# Patient Record
Sex: Female | Born: 1950 | Race: Black or African American | Hispanic: No | Smoking: Never smoker
Health system: Southern US, Community
[De-identification: ages and names within clinical notes are randomized; demographics above are authoritative.]

## PROBLEM LIST (undated history)

## (undated) DIAGNOSIS — E785 Hyperlipidemia, unspecified: Secondary | ICD-10-CM

## (undated) DIAGNOSIS — E039 Hypothyroidism, unspecified: Secondary | ICD-10-CM

## (undated) DIAGNOSIS — L639 Alopecia areata, unspecified: Secondary | ICD-10-CM

## (undated) DIAGNOSIS — M1990 Unspecified osteoarthritis, unspecified site: Secondary | ICD-10-CM

## (undated) DIAGNOSIS — E119 Type 2 diabetes mellitus without complications: Secondary | ICD-10-CM

## (undated) DIAGNOSIS — I10 Essential (primary) hypertension: Secondary | ICD-10-CM

## (undated) HISTORY — DX: Hypothyroidism, unspecified: E03.9

## (undated) HISTORY — DX: Alopecia areata, unspecified: L63.9

## (undated) HISTORY — DX: Hyperlipidemia, unspecified: E78.5

## (undated) HISTORY — DX: Essential (primary) hypertension: I10

## (undated) HISTORY — DX: Type 2 diabetes mellitus without complications: E11.9

## (undated) HISTORY — PX: CARPAL TUNNEL RELEASE: SHX101

---

## 1988-12-12 HISTORY — PX: ABDOMINAL HYSTERECTOMY: SHX81

## 1999-06-24 ENCOUNTER — Encounter: Admission: RE | Admit: 1999-06-24 | Discharge: 1999-07-08 | Payer: Self-pay

## 2001-04-02 ENCOUNTER — Ambulatory Visit (HOSPITAL_COMMUNITY): Admission: RE | Admit: 2001-04-02 | Discharge: 2001-04-02 | Payer: Self-pay | Admitting: Cardiovascular Disease

## 2005-03-22 ENCOUNTER — Encounter: Admission: RE | Admit: 2005-03-22 | Discharge: 2005-06-20 | Payer: Self-pay | Admitting: Internal Medicine

## 2005-07-29 ENCOUNTER — Emergency Department (HOSPITAL_COMMUNITY): Admission: EM | Admit: 2005-07-29 | Discharge: 2005-07-29 | Payer: Self-pay | Admitting: Emergency Medicine

## 2006-12-12 HISTORY — PX: COLONOSCOPY: SHX174

## 2007-03-27 ENCOUNTER — Ambulatory Visit (HOSPITAL_BASED_OUTPATIENT_CLINIC_OR_DEPARTMENT_OTHER): Admission: RE | Admit: 2007-03-27 | Discharge: 2007-03-27 | Payer: Self-pay | Admitting: Orthopaedic Surgery

## 2007-03-31 ENCOUNTER — Emergency Department (HOSPITAL_COMMUNITY): Admission: EM | Admit: 2007-03-31 | Discharge: 2007-03-31 | Payer: Self-pay | Admitting: Emergency Medicine

## 2007-04-01 ENCOUNTER — Emergency Department (HOSPITAL_COMMUNITY): Admission: EM | Admit: 2007-04-01 | Discharge: 2007-04-01 | Payer: Self-pay | Admitting: Emergency Medicine

## 2007-04-01 ENCOUNTER — Ambulatory Visit: Payer: Self-pay | Admitting: Internal Medicine

## 2007-04-03 ENCOUNTER — Ambulatory Visit (HOSPITAL_COMMUNITY): Admission: RE | Admit: 2007-04-03 | Discharge: 2007-04-03 | Payer: Self-pay | Admitting: Gastroenterology

## 2007-04-10 ENCOUNTER — Inpatient Hospital Stay (HOSPITAL_COMMUNITY): Admission: EM | Admit: 2007-04-10 | Discharge: 2007-04-13 | Payer: Self-pay | Admitting: Emergency Medicine

## 2007-08-17 ENCOUNTER — Ambulatory Visit: Payer: Self-pay

## 2007-08-29 ENCOUNTER — Ambulatory Visit: Payer: Self-pay | Admitting: Internal Medicine

## 2007-08-31 ENCOUNTER — Encounter: Admission: RE | Admit: 2007-08-31 | Discharge: 2007-08-31 | Payer: Self-pay | Admitting: Family Medicine

## 2008-09-03 ENCOUNTER — Encounter: Admission: RE | Admit: 2008-09-03 | Discharge: 2008-09-03 | Payer: Self-pay | Admitting: Internal Medicine

## 2008-12-12 LAB — CONVERTED CEMR LAB

## 2009-09-04 ENCOUNTER — Encounter: Admission: RE | Admit: 2009-09-04 | Discharge: 2009-09-04 | Payer: Self-pay | Admitting: Family Medicine

## 2010-08-24 ENCOUNTER — Observation Stay (HOSPITAL_COMMUNITY): Admission: EM | Admit: 2010-08-24 | Discharge: 2010-08-25 | Payer: Self-pay | Admitting: Emergency Medicine

## 2010-08-24 ENCOUNTER — Ambulatory Visit: Payer: Self-pay | Admitting: Internal Medicine

## 2010-08-25 ENCOUNTER — Encounter: Payer: Self-pay | Admitting: Internal Medicine

## 2010-08-25 ENCOUNTER — Encounter (INDEPENDENT_AMBULATORY_CARE_PROVIDER_SITE_OTHER): Payer: Self-pay | Admitting: Emergency Medicine

## 2010-08-25 LAB — CONVERTED CEMR LAB
BUN: 8 mg/dL
CO2: 106 meq/L
Chloride: 106 meq/L
Creatinine, Ser: 0.86 mg/dL
Glucose, Bld: 89 mg/dL
HCT: 37.6 %
Hemoglobin: 12.8 g/dL
Platelets: 215 10*3/uL
Potassium: 4.4 meq/L
Sodium: 140 meq/L
WBC: 5.8 10*3/uL

## 2010-09-09 ENCOUNTER — Encounter: Admission: RE | Admit: 2010-09-09 | Discharge: 2010-09-09 | Payer: Self-pay | Admitting: Family Medicine

## 2010-09-09 LAB — HM MAMMOGRAPHY

## 2010-11-30 ENCOUNTER — Encounter: Payer: Self-pay | Admitting: Internal Medicine

## 2010-12-29 ENCOUNTER — Encounter: Payer: Self-pay | Admitting: Internal Medicine

## 2010-12-29 DIAGNOSIS — E1169 Type 2 diabetes mellitus with other specified complication: Secondary | ICD-10-CM | POA: Insufficient documentation

## 2010-12-29 DIAGNOSIS — I10 Essential (primary) hypertension: Secondary | ICD-10-CM | POA: Insufficient documentation

## 2010-12-29 DIAGNOSIS — E039 Hypothyroidism, unspecified: Secondary | ICD-10-CM | POA: Insufficient documentation

## 2010-12-29 DIAGNOSIS — I2510 Atherosclerotic heart disease of native coronary artery without angina pectoris: Secondary | ICD-10-CM | POA: Insufficient documentation

## 2010-12-29 DIAGNOSIS — K219 Gastro-esophageal reflux disease without esophagitis: Secondary | ICD-10-CM | POA: Insufficient documentation

## 2010-12-29 DIAGNOSIS — E785 Hyperlipidemia, unspecified: Secondary | ICD-10-CM | POA: Insufficient documentation

## 2010-12-29 DIAGNOSIS — M1990 Unspecified osteoarthritis, unspecified site: Secondary | ICD-10-CM | POA: Insufficient documentation

## 2010-12-29 DIAGNOSIS — E119 Type 2 diabetes mellitus without complications: Secondary | ICD-10-CM | POA: Insufficient documentation

## 2010-12-29 DIAGNOSIS — I152 Hypertension secondary to endocrine disorders: Secondary | ICD-10-CM | POA: Insufficient documentation

## 2010-12-31 ENCOUNTER — Encounter: Payer: Self-pay | Admitting: Internal Medicine

## 2010-12-31 ENCOUNTER — Ambulatory Visit
Admission: RE | Admit: 2010-12-31 | Discharge: 2010-12-31 | Payer: Self-pay | Source: Home / Self Care | Attending: Internal Medicine | Admitting: Internal Medicine

## 2010-12-31 DIAGNOSIS — G7000 Myasthenia gravis without (acute) exacerbation: Secondary | ICD-10-CM | POA: Insufficient documentation

## 2010-12-31 DIAGNOSIS — L659 Nonscarring hair loss, unspecified: Secondary | ICD-10-CM | POA: Insufficient documentation

## 2011-01-13 NOTE — Assessment & Plan Note (Signed)
Summary: New / Medicare / Regular Medicaid / #? cd   Vital Signs:  Patient profile:   60 year old female Height:      66 inches (167.64 cm) Weight:      172.4 pounds (78.36 kg) BMI:     27.93 O2 Sat:      99 % on Room air Temp:     97.9 degrees F (36.61 degrees C) oral Pulse rate:   55 / minute BP sitting:   120 / 78  (left arm) Cuff size:   regular  Vitals Entered By: Orlan Leavens RMA (December 31, 2010 9:44 AM)  O2 Flow:  Room air CC: New patient Is Patient Diabetic? Yes Did you bring your meter with you today? No Pain Assessment Patient in pain? no        Primary Care Provider:  Newt Lukes MD  CC:  New patient.  History of Present Illness: new pt to me and our division, here to est care prev folowed with dr. [facility] Lanius [facility]  concerned with hair loss -  onset 3-4 months ago - affects B temporal and crown/frontal scalp sees derm for same - ?alopecia or med induced -  wants off "all my medications"  also reviewed chronic med issues -  myasthenia gravis - dx age 44y - follows with neuro at [facility] annually for same - no recent flares or exac -reports compliance with ongoing medical treatment and no changes in medication dose or frequency. denies adverse side effects related to current therapy.   DM2 -  dx 02/2005- reports compliance with ongoing medical treatment and no changes in medication dose or frequency. denies adverse side effects related to current therapy.   HTN - reports compliance with ongoing medical treatment and no changes in medication dose or frequency. denies adverse side effects related to current therapy.   dyslipidemia - reports compliance with ongoing medical treatment and no changes in medication dose or frequency. denies adverse side effects related to current therapy.   hypothyroid - reports compliance with ongoing medical treatment and no changes in medication dose or frequency. denies adverse side effects related to current therapy.    Preventive Screening-Counseling & Management  Alcohol-Tobacco     Alcohol drinks/day: 0     Alcohol Counseling: not indicated; patient does not drink     Smoking Status: never     Tobacco Counseling: not indicated; no tobacco use  Caffeine-Diet-Exercise     Does Patient Exercise: yes     Exercise Counseling: not indicated; exercise is adequate     Depression Counseling: not indicated; screening negative for depression  Safety-Violence-Falls     Seat Belt Counseling: not indicated; patient wears seat belts     Helmet Counseling: not indicated; patient wears helmet when riding bicycle/motocycle     Firearm Counseling: not applicable     Violence Counseling: not indicated; no violence risk noted  Clinical Review Panels:  Prevention   Last Mammogram:  No specific mammographic evidence of malignancy.  Assessment: BIRADS 1. Location: [facility].    (09/09/2010)   Last Pap Smear:  Interpretation Result:Negative for intraepithelial Lesion or Malignancy.    (12/12/2008)  Immunizations   Last Tetanus Booster:  Historical (12/12/2009)  CBC   WBC:  5.8 (08/25/2010)   Hgb:  12.8 (08/25/2010)   Hct:  37.6 (08/25/2010)   Platelets:  215 (08/25/2010)  Complete Metabolic Panel   Glucose:  89 (08/25/2010)   Sodium:  140 (08/25/2010)  Potassium:  4.4 (08/25/2010)   Chloride:  106 (08/25/2010)   CO2:  106 (08/25/2010)   BUN:  8 (08/25/2010)   Creatinine:  0.86 (08/25/2010)   Current Medications (verified): 1)  Lipitor 10 Mg Tabs (Atorvastatin Calcium) .... Take 1 By Mouth Once Daily 2)  Glipizide 5 Mg Tabs (Glipizide) .... Take 1 By Mouth Once Daily 3)  Metformin Hcl 500 Mg Tabs (Metformin Hcl) .... Take 1 Two Times A Day 4)  Synthroid 75 Mcg Tabs (Levothyroxine Sodium) .... Take 1 By Mouth Once Daily 5)  Omeprazole 20 Mg Cpdr (Omeprazole) .... Take 1 By Mouth Once Daily 6)  Losartan Potassium 100 Mg Tabs (Losartan Potassium) .... Take 1 By Mouth Once  Daily 7)  Mestinon 60 Mg Tabs (Pyridostigmine Bromide) .... Take Three Times A Day 8)  Amlodipine Besylate 5 Mg Tabs (Amlodipine Besylate) .... Take 1 By Mouth Once Daily 9)  Iron 325 (65 Fe) Mg Tabs (Ferrous Sulfate) .... Take 1 By Mouth Once Daily  Allergies (verified): No Known Drug Allergies  Past History:  Past Medical History: Diabetes mellitus, type II GERD Hyperlipidemia Hypertension Hypothyroidism Osteoarthritis Coronary artery disease myashenia gravis - dx age 2y  MD roster: neuro - caress @ [facility] - [facility] - [facility]- [facility]  Past Surgical History: Hysterectomy (1990)  Family History: unknown  Social History: Never Smoked no alcohol divorced, lives alone - 3 kids Smoking Status:  never Does Patient Exercise:  yes  Review of Systems       see HPI above. I have reviewed all other systems and they were negative.   Physical Exam  General:  alert, well-developed, well-nourished, and cooperative to examination.    Head:  Normocephalic and atraumatic without obvious abnormalities. adrogenic pattern alopecia  Eyes:  vision grossly intact; pupils equal, round and reactive to light.  conjunctiva and lids normal.    Ears:  R ear normal and L ear normal.   Mouth:  teeth and gums in good repair; mucous membranes moist, without lesions or ulcers. oropharynx clear without exudate, no erythema.  Neck:  supple, full ROM, no masses, no thyromegaly; no thyroid nodules or tenderness. no JVD or carotid bruits.   Lungs:  normal respiratory effort, no intercostal retractions or use of accessory muscles; normal breath sounds bilaterally - no crackles and no wheezes.    Heart:  normal rate, regular rhythm, no murmur, and no rub. BLE without edema. Abdomen:  soft, non-tender, normal bowel sounds, no distention; no masses and no appreciable hepatomegaly or splenomegaly.   Msk:  No deformity or scoliosis noted of thoracic or lumbar spine.   Neurologic:   alert & oriented X3 and cranial nerves II-XII symetrically intact.  strength normal in all extremities, sensation intact to light touch, and gait normal. speech fluent without dysarthria or aphasia; follows commands with good comprehension.  Skin:  no rashes, vesicles, ulcers, or erythema. No nodules or irregularity to palpation.  Psych:  Oriented X3, memory intact for recent and remote, normally interactive, good eye contact, not anxious appearing, not depressed appearing, and not agitated.      Impression & Recommendations:  Problem # 1:  ALOPECIA (ICD-704.00) pt believes med related - has seen derm for same - unable to tol rogain as per derm rec due to scalp burning/itch - ?immunologic given hx MG?? - send for copy of derm eval  will try to reduce other meds as tol so long as other chronic med issues are controlled - see next  Problem # 2:  DIABETES MELLITUS, TYPE II (ICD-250.00)  send for records on last a1c - pt reports 6.0 stop OHA and cont metformin until labs revioewed -  to call if uncontrolled cbg btwn now and next ov  The following medications were removed from the medication list:    Glipizide 5 Mg Tabs (Glipizide) .Marland Kitchen... Take 1 by mouth once daily    Aspirin 81 Mg Tbec (Aspirin) .Marland Kitchen... Take 1 by mouth once daily Her updated medication list for this problem includes:    Metformin Hcl 500 Mg Tabs (Metformin hcl) .Marland Kitchen... Take 1 two times a day    Losartan Potassium 100 Mg Tabs (Losartan potassium) .Marland Kitchen... Take 1 by mouth once daily  Labs Reviewed: Creat: 0.86 (08/25/2010)     Problem # 3:  HYPERTENSION (ICD-401.9)  reduce meds and watch to ensure cont BP control note pt has lost >30# since being rx'd all these meds and may be controlled with fewer meds -  The following medications were removed from the medication list:    Amlodipine Besylate 5 Mg Tabs (Amlodipine besylate) .Marland Kitchen... Take 1 by mouth once daily Her updated medication list for this problem includes:    Losartan  Potassium 100 Mg Tabs (Losartan potassium) .Marland Kitchen... Take 1 by mouth once daily  BP today: 120/78  Labs Reviewed: K+: 4.4 (08/25/2010) Creat: : 0.86 (08/25/2010)     Problem # 4:  HYPOTHYROIDISM (ICD-244.9) review last labs - doubt will be able to do without any thyorid med - same explained to pt Her updated medication list for this problem includes:    Synthroid 75 Mcg Tabs (Levothyroxine sodium) .Marland Kitchen... Take 1 by mouth once daily  Problem # 5:  HYPERLIPIDEMIA (ICD-272.4) send for and review last labs - ?reduce dose or consider stopping med if other dx well controlled Her updated medication list for this problem includes:    Lipitor 10 Mg Tabs (Atorvastatin calcium) .Marland Kitchen... 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 Take 1 by mouth once daily 2)  Metformin Hcl 500 Mg Tabs (Metformin hcl) .... Take 1 two times a day 3)  Synthroid 75 Mcg Tabs (Levothyroxine sodium) .... Take 1 by mouth once daily 4)  Omeprazole 20 Mg Cpdr (Omeprazole) .... Take 1 by mouth once daily 5)  Losartan Potassium 100 Mg Tabs (Losartan potassium) .... Take 1 by mouth once daily 6)  Mestinon 60 Mg Tabs (Pyridostigmine bromide) .... Take three times a day - or as per neurology doctors 7)  Iron 325 (65 Fe) Mg Tabs (Ferrous sulfate) .... Take 1 by mouth once daily  Patient Instructions: 1)  it was good to see you today. 2)  medical history and medications reviewed today 3)  will send for records from dr. Providence Lanius and dr. [facility] - please ask your other specialists to send me copies of their visits/notes when you see them 4)  stop glipizide and amlodipine today - other medications continue without change 5)  check your blood pressure and call  if over 140/90; also check your sugars - call sugar if over 200 6)  Please schedule a follow-up appointment in 6 weeks to recheck blood pressure and medication review, sooner if problems.    Orders Added:  1)  New Patient Level III [99203]   Immunization History:  Tetanus/Td Immunization History:    Tetanus/Td:  historical (12/12/2009)   Immunization History:  Tetanus/Td Immunization History:    Tetanus/Td:  Historical (12/12/2009)   Mammogram  Procedure date:  09/09/2010  Findings:      Location: [facility].   No specific mammographic evidence of malignancy.  Assessment: BIRADS 1.   Pap Smear  Procedure date:  12/12/2008  Findings:      Interpretation Result:Negative for intraepithelial Lesion or Malignancy.

## 2011-01-13 NOTE — Medication Information (Signed)
Summary: List of medications/Patient  List of medications/Patient   Imported By: Sherian Rein 01/05/2011 11:38:42  _____________________________________________________________________  External Attachment:    Type:   Image     Comment:   External Document

## 2011-01-13 NOTE — Letter (Signed)
Summary: Duard Larsen MD  Duard Larsen MD   Imported By: Sherian Rein 01/06/2011 12:47:03  _____________________________________________________________________  External Attachment:    Type:   Image     Comment:   External Document

## 2011-02-11 ENCOUNTER — Ambulatory Visit (INDEPENDENT_AMBULATORY_CARE_PROVIDER_SITE_OTHER): Payer: Medicare Other | Admitting: Internal Medicine

## 2011-02-11 ENCOUNTER — Encounter: Payer: Self-pay | Admitting: Internal Medicine

## 2011-02-11 ENCOUNTER — Other Ambulatory Visit: Payer: Medicare Other

## 2011-02-11 ENCOUNTER — Other Ambulatory Visit: Payer: Self-pay | Admitting: Internal Medicine

## 2011-02-11 DIAGNOSIS — E039 Hypothyroidism, unspecified: Secondary | ICD-10-CM

## 2011-02-11 DIAGNOSIS — E119 Type 2 diabetes mellitus without complications: Secondary | ICD-10-CM

## 2011-02-11 DIAGNOSIS — I10 Essential (primary) hypertension: Secondary | ICD-10-CM

## 2011-02-11 DIAGNOSIS — L659 Nonscarring hair loss, unspecified: Secondary | ICD-10-CM

## 2011-02-11 LAB — HEMOGLOBIN A1C: Hgb A1c MFr Bld: 5.8 % (ref 4.6–6.5)

## 2011-02-11 LAB — TSH: TSH: 0.97 u[IU]/mL (ref 0.35–5.50)

## 2011-02-11 LAB — T4, FREE: Free T4: 0.9 ng/dL (ref 0.60–1.60)

## 2011-02-17 NOTE — Assessment & Plan Note (Signed)
Summary: 6 WEEK FOLLOWUP//STC   Vital Signs:  Patient profile:   60 year old female Weight:      170 pounds (77.27 kg) O2 Sat:      99 % on Room air Temp:     98.2 degrees F (36.78 degrees C) oral Pulse rate:   52 / minute BP sitting:   120 / 84  (left arm) Cuff size:   large  Vitals Entered By: Orlan Leavens RMA (February 11, 2011 10:34 AM)  O2 Flow:  Room air CC: 6 week follow-up Is Patient Diabetic? Yes Did you bring your meter with you today? No Pain Assessment Patient in pain? no        Primary Care Provider:  Newt Lukes MD  CC:  6 week follow-up.  History of Present Illness: here for f/u  alsopecia - onset hair loss 10/2010 affects B temporal and crown/frontal scalp s/p derm eval for same ([facility]) - ?alopecia vs med induced -  wants off "all my medications", esp levothyrox wants second opinion  myasthenia gravis - dx age 29y - follows with neuro at [facility] annually for same - no recent flares or exac -reports compliance with ongoing medical treatment and no changes in medication dose or frequency. denies adverse side effects related to current therapy.   DM2 -  dx 02/2005- reports compliance with ongoing medical treatment and no changes in medication dose or frequency. denies adverse side effects related to current therapy.  improved cbg with wt loss  HTN - reports compliance with ongoing medical treatment and no changes in medication dose or frequency. denies adverse side effects related to current therapy.   dyslipidemia - reports compliance with ongoing medical treatment and no changes in medication dose or frequency. denies adverse side effects related to current therapy.   hypothyroid - reports compliance with ongoing medical treatment and no changes in medication dose or frequency. denies adverse side effects related to current therapy.   Clinical Review Panels:  Prevention   Last Mammogram:  No specific mammographic evidence of malignancy.  Assessment: BIRADS 1. Location: [facility].    (09/09/2010)   Last Pap Smear:  Interpretation Result:Negative for intraepithelial Lesion or Malignancy.    (12/12/2008)  Immunizations   Last Tetanus Booster:  Historical (12/12/2009)  Diabetes Management   Creatinine:  0.86 (08/25/2010)  CBC   WBC:  5.8 (08/25/2010)   Hgb:  12.8 (08/25/2010)   Hct:  37.6 (08/25/2010)   Platelets:  215 (08/25/2010)  Complete Metabolic Panel   Glucose:  89 (08/25/2010)   Sodium:  140 (08/25/2010)   Potassium:  4.4 (08/25/2010)   Chloride:  106 (08/25/2010)   CO2:  106 (08/25/2010)   BUN:  8 (08/25/2010)   Creatinine:  0.86 (08/25/2010)   Current Medications (verified): 1)  Lipitor 10 Mg Tabs (Atorvastatin Calcium) .... Take 1 By Mouth Once Daily 2)  Metformin Hcl 500 Mg Tabs (Metformin Hcl) .... Take 1 Two Times A Day 3)  Synthroid 75 Mcg Tabs (Levothyroxine Sodium) .... Take 1 By Mouth Once Daily 4)  Losartan Potassium 100 Mg Tabs (Losartan Potassium) .... Take 1 By Mouth Once Daily 5)  Mestinon 60 Mg Tabs (Pyridostigmine Bromide) .... Take Three Times A Day - or As Per Neurology Doctors 6)  Iron 325 (65 Fe) Mg Tabs (Ferrous Sulfate) .... Take 1 By Mouth Once Daily  Allergies (verified): No Known Drug Allergies  Past History:  Past Medical History: Diabetes mellitus, type II GERD  Hyperlipidemia  Hypertension Hypothyroidism Osteoarthritis Coronary artery disease myashenia gravis - dx age 64y  MD roster: neuro - caress @ [facility] - [facility] - [facility]- [facility]  Review of Systems  The patient denies fever, chest pain, syncope, headaches, and abdominal pain.    Physical Exam  General:  alert, well-developed, well-nourished, and cooperative to examination.    Lungs:  normal respiratory effort, no intercostal retractions or use of accessory muscles; normal breath sounds bilaterally - no crackles and no wheezes.    Heart:  normal rate,  regular rhythm, no murmur, and no rub. BLE without edema. Skin:  thin hair but no balding across crown and temples B - nontener to palp -no rashes, vesicles, ulcers, or erythema. No nodules or irregularity to palpation.  Psych:  Oriented X3, memory intact for recent and remote, normally interactive, good eye contact, not anxious appearing, not depressed appearing, and not agitated.      Impression & Recommendations:  Problem # 1:  ALOPECIA (ICD-704.00)  Orders: Dermatology Referral (Derma)  pt believes med related - has seen derm ([facility]) for same 11/2010 - consult note in EMR reviewed - not improved with rogain as per derm rec due to scalp burning/itch - ?immunologic given hx MG?? - send for second opinon derm eval  will try to reduce other meds as tol so long as other chronic med issues are controlled - see next  Problem # 2:  HYPOTHYROIDISM (ICD-244.9)  Her updated medication list for this problem includes:    Levothyroxine Sodium 25 Mcg Tabs (Levothyroxine sodium) .Marland Kitchen... 1 by mouth once daily  Orders: TLB-TSH (Thyroid Stimulating Hormone) (84443-TSH) TLB-T4 (Thyrox), Free [phone])  check labs -  doubt will be able to do without thyroid med but will try reduction in dose if normal range - same explained to pt  Problem # 3:  DIABETES MELLITUS, TYPE II (ICD-250.00)  Her updated medication list for this problem includes:    Metformin Hcl 500 Mg Tabs (Metformin hcl) .Marland Kitchen... Take 1 by mouth once daily    Losartan Potassium 100 Mg Tabs (Losartan potassium) .Marland Kitchen... Take 1/2 by mouth once daily  Orders: TLB-A1C / Hgb A1C (Glycohemoglobin) (83036-A1C)   last a1c - pt reports 6.0 off OHA 01/2011 and cont metformin until labs reviewed -  to call if uncontrolled cbg btwn now and next ov  Problem # 4:  HYPERTENSION (ICD-401.9)  Her updated medication list for this problem includes:    Losartan Potassium 100 Mg Tabs (Losartan potassium) .Marland Kitchen... Take 1/2 by mouth once daily  reduced meds  01/2011 and more today; watch to ensure cont BP control note pt has lost >30# since being rx'd all these meds and may be controlled with fewer meds -  BP today: 120/84 Prior BP: 120/78 (12/31/2010)  Labs Reviewed: K+: 4.4 (08/25/2010) Creat: : 0.86 (08/25/2010)     Complete Medication List: 1)  Lipitor 10 Mg Tabs (Atorvastatin calcium) .... Take 1 by mouth once daily 2)  Metformin Hcl 500 Mg Tabs (Metformin hcl) .... Take 1 by mouth once daily 3)  Levothyroxine Sodium 25 Mcg Tabs (Levothyroxine sodium) .Marland Kitchen.. 1 by mouth once daily 4)  Losartan Potassium 100 Mg Tabs (Losartan potassium) .... Take 1/2 by mouth once daily 5)  Mestinon 60 Mg Tabs (Pyridostigmine bromide) .... Take three times a day - or as per neurology doctors 6)  Iron 325 (65 Fe) Mg Tabs (Ferrous sulfate) .... Take 1 by mouth once daily  Patient Instructions: 1)  it was  good to see you today. 2)  test(s) ordered today - your results will be called to you after review; if any changes need to be made or there are abnormal results, you will be notified at that time 3)  we'll make referral to dr. Jorja Loa for second opinion. Our office will contact you regarding this appointment once made.  4)  reduce losartan by half - take 50mg  daily starting today - other medications continue without change for now 5)  check your blood pressure and call if over 140/90; also check your sugars - call sugar if over 200 6)  Please schedule a follow-up appointment in 3 months to recheck blood pressure and medication review, sooner if problems.    Orders Added: 1)  Dermatology Referral [Derma] 2)  Est. Patient Level IV [16109] 3)  TLB-TSH (Thyroid Stimulating Hormone) [84443-TSH] 4)  TLB-T4 (Thyrox), Free [60454-UJ8J] 5)  TLB-A1C / Hgb A1C (Glycohemoglobin) [83036-A1C]

## 2011-02-24 LAB — BASIC METABOLIC PANEL
BUN: 8 mg/dL (ref 6–23)
CO2: 28 mEq/L (ref 19–32)
Calcium: 9.6 mg/dL (ref 8.4–10.5)
Chloride: 106 mEq/L (ref 96–112)
Creatinine, Ser: 0.86 mg/dL (ref 0.4–1.2)
GFR calc Af Amer: >60 mL/min (ref >60)
GFR calc non Af Amer: >60 mL/min (ref >60)
Glucose, Bld: 89 mg/dL (ref 70–99)
Potassium: 4.4 mEq/L (ref 3.5–5.1)
Sodium: 140 mEq/L (ref 135–145)

## 2011-02-24 LAB — POCT CARDIAC MARKERS
CKMB, poc: 1.2 ng/mL (ref 1.0–8.0)
CKMB, poc: <1 ng/mL — ABNORMAL LOW (ref 1.0–8.0)
CKMB, poc: <1 ng/mL — ABNORMAL LOW (ref 1.0–8.0)
Myoglobin, poc: 46.4 ng/mL (ref 12–200)
Myoglobin, poc: 48.5 ng/mL (ref 12–200)
Myoglobin, poc: 60.8 ng/mL (ref 12–200)
Troponin i, poc: <0.05 ng/mL (ref 0.00–0.09)
Troponin i, poc: <0.05 ng/mL (ref 0.00–0.09)
Troponin i, poc: <0.05 ng/mL (ref 0.00–0.09)

## 2011-02-24 LAB — DIFFERENTIAL
Basophils Absolute: 0 10*3/uL (ref 0.0–0.1)
Basophils Relative: 1 % (ref 0–1)
Eosinophils Absolute: 0.5 10*3/uL (ref 0.0–0.7)
Eosinophils Relative: 8 % — ABNORMAL HIGH (ref 0–5)
Lymphocytes Relative: 40 % (ref 12–46)
Lymphs Abs: 2.3 10*3/uL (ref 0.7–4.0)
Monocytes Absolute: 0.5 10*3/uL (ref 0.1–1.0)
Monocytes Relative: 9 % (ref 3–12)
Neutro Abs: 2.5 10*3/uL (ref 1.7–7.7)
Neutrophils Relative %: 43 % (ref 43–77)

## 2011-02-24 LAB — CBC
HCT: 37.6 % (ref 36.0–46.0)
Hemoglobin: 12.8 g/dL (ref 12.0–15.0)
MCH: 30.5 pg (ref 26.0–34.0)
MCHC: 34 g/dL (ref 30.0–36.0)
MCV: 89.7 fL (ref 78.0–100.0)
Platelets: 215 10*3/uL (ref 150–400)
RBC: 4.19 MIL/uL (ref 3.87–5.11)
RDW: 13.4 % (ref 11.5–15.5)
WBC: 5.8 10*3/uL (ref 4.0–10.5)

## 2011-02-24 LAB — D-DIMER, QUANTITATIVE (NOT AT ARMC): D-Dimer, Quant: 0.42 ug/mL-FEU (ref 0.00–0.48)

## 2011-04-26 NOTE — Assessment & Plan Note (Signed)
[facility]                            CARDIOLOGY OFFICE NOTE   NAME:Picone, KINZE LABO                      MRN:          [id number]  DATE:08/29/2007                            DOB:          28-Dec-1950    CARDIOLOGIST:  Bevelyn Buckles. Bensimhon, M.D.   PRIMARY CARE PHYSICIAN:  Maryelizabeth Rowan, M.D.   REASON FOR APPOINTMENT:  Follow up, post stress test.   HISTORY OF PRESENT ILLNESS:  Ms. Tobia is a 60 year old female patient  with a history of diabetes mellitus, hypertension, hyperlipidemia, and  myasthenia gravis.  He was seen by Dr. Gala Romney in the ER on April 01, 2007 secondary to chest pain.  This was very atypical for ischemia, and  he discharged her from the emergency room with plans for outpatient  stress test.  Unfortunately, the patient developed right knee infection,  status post arthroscopic surgery.  She was in the hospital for a few  days and actually had a PICC line placed and eventually was discharged  to home with home antibiotics for quite some time.  She was unable to do  the stress test until recently.  This was done on September 5th.  It was  an Adenosine study that showed no sign of scar ischemia with an EF of  50%.  LV function was normal in contractility and thickening.  All areas  of the myocardium was normal.   She notes that she has not had any further chest pain, shortness of  breath, syncope, or near syncope.  She is doing well and has almost  fully recovered from her knee infection.   CURRENT MEDICATIONS:  1. Mestinon 60 mg 3 tablets b.i.d.  2. Cozaar 100 mg a day.  3. Glipizide 5 mg a day.  4. Glucosamine.  5. Synthroid 0.075 mg a day.  6. Lipitor 20 mg nightly.  7. Metformin 500 mg 2 tablets daily.  8. Multivitamin.  9. Advair 250/50 daily.  10.Albuterol p.r.n.   PHYSICAL EXAMINATION:  She is a well-developed and well-nourished female  in no distress.  Blood pressure is 126/88, pulse 65, weight 191 pounds.  HEENT:  Normal.  CARDIAC:  Normal S1 and S2.  Regular rate and rhythm.  LUNGS:  Clear to auscultation bilaterally.  ABDOMEN:  Soft and nontender.  EXTREMITIES:  Without edema.  Carotids without bruits bilaterally.   IMPRESSION:  1. Noncardiac chest pain, resolved.      a.     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  2. Normal coronaries by catheterization in 2002.  3. Hypertension.  4. Hyperlipidemia.  5. Diabetes mellitus.  6. Hypothyroidism.  7. Myasthenia gravis.  8. Osteoarthritis, status post right knee arthroscopic surgery.      a.     Complicated by infection.   PLAN:  Patient presents to the office today for followup.  Overall, she  is doing well.  She denies any recurrent chest pain.  She had a negative  Cardiolite recently and normal coronary arteries by catheterization in  2002.  No further cardiovascular testing is warranted at this time.  She  can follow  up with Dr. Gala Romney on a p.r.n. basis.  Continued risk  factor modification will be per her primary care physician.      Tereso Newcomer, PA-C  Electronically Signed      Doylene Canning. Ladona Ridgel, MD  Electronically Signed   SW/MedQ  DD: 08/29/2007  DT: 08/30/2007  Job #: [id number]   cc:   Maryelizabeth Rowan, M.D.

## 2011-04-29 NOTE — Discharge Summary (Signed)
NAMEMINOLA, [name]               ACCOUNT NO.:  [id number]   MEDICAL RECORD NO.:  [id number]          PATIENT TYPE:  INP   LOCATION:  5734                         FACILITY:  [facility]   PHYSICIAN:  Vanita Panda. Magnus Ivan, M.D.DATE OF BIRTH:  03-Jun-1951   DATE OF ADMISSION:  04/10/2007  DATE OF DISCHARGE:  04/13/2007                               DISCHARGE SUMMARY   ADMITTING DIAGNOSIS:  Right knee joint infection status post  arthroscopy.   DISCHARGE DIAGNOSIS:  Right knee joint infection status post  arthroscopy.   PROCEDURES:  1. Arthroscopic irrigation and debridement with synovectomy right      knee.  2. PIC line placement for IV antibiotics.   HOSPITAL COURSE:  Gloria [name] is a 60 year old who 2 weeks after  arthroscopic knee surgery for debridement and chondroplasty, developed  an acute onset of swelling of her knee.  She was seen in the emergency  room, and an aspiration was obtained that showed 70,000 white cells in  the knee joint.  There were no organisms present, but due to the high  white count, it was felt that this was septic infection.  I talked to  her about the arthroscopic assessment washout of the knee joint, the  risks and benefits of the procedure were explained to her, were  understood, and she agreed to proceed with surgery.  She was taken to  the operating room on the day of admission, where she underwent a  thorough arthroscopic irrigation, debridement and synovectomy of that  knee.  For detailed description of the operation, please refer to the  dictated operative note in the medical record.   Postoperatively, she was admitted to a regular floor and started on IV  antibiotics.  A PIC line was placed for continued IV antibiotics  following her discharge.  I put her in a CTM machine to help with range  of motion of her knee.  By the day of discharge, her peripheral white  blood cell count had come down to 9000, and the knee is improved  overall.   DISPOSITION:  To home.   MEDICATIONS:  1. IV vancomycin, dose per home health pharmacy.  2. Oral pain medication.   DISCHARGE INSTRUCTIONS:  While she is at home, she will continue range  of motion of her knee with laboring as tolerated, and she will continue  IV antibiotics via her PIC line with vancomycin for 2 weeks.  Following  discharge, a followup appointment will be established in my office in  the next 2 weeks as well, and likely then we will then change her to  oral antibiotics.     Vanita Panda. Magnus Ivan, M.D.  Electronically Signed    CYB/MEDQ  D:  05/14/2007  T:  05/15/2007  Job:  [id number]

## 2011-04-29 NOTE — Op Note (Signed)
NAMEDARLYN, REPSHER               ACCOUNT NO.:  [id number]   MEDICAL RECORD NO.:  [id number]          PATIENT TYPE:  INP   LOCATION:  2550                         FACILITY:  [facility]   PHYSICIAN:  Vanita Panda. Magnus Ivan, M.D.DATE OF BIRTH:  1951-08-06   DATE OF PROCEDURE:  04/10/2007  DATE OF DISCHARGE:                               OPERATIVE REPORT   PREOPERATIVE DIAGNOSIS:  Right knee joint infection.   POSTOPERATIVE DIAGNOSIS:  Right knee joint infection.   PROCEDURE:  Right knee arthroscopy with irrigation, debridement and  synovectomy.   SURGEON:  Doneen Poisson, MD   ANESTHESIA:  General.   BLOOD LOSS:  Minimal.   COMPLICATIONS:  None.   INDICATIONS:  Brief Ms. Kittrell is a 60 year old who I have performed a  right knee arthroscopy with debridement and a basic arthroscopy  approximately 2 weeks ago.  She is a diabetic but not poorly controlled  and also has myasthenia gravis.  She had done well at her first  postoperative and actually had been progressing nicely and about two  days ago developed worsening pain in her knee with swelling.  She came  to the ER early this morning.  The ER physicians tapped the knee and  aspiration showed 70,000 white cells.  There was purulent appears to  this and I recommended she undergo urgent irrigation and debridement of  the native knee.  The risks, benefits of this were explained her, well  understood and she agreed to proceed with surgery.   PROCEDURE DESCRIPTION:  After informed consent obtained, appropriate  right leg was marked, right knee was marked, Ms. Lavalle was brought the  operating room, placed supine on the operating table.  General  anesthesia was obtained.  Her right leg was then prepped and draped with  DuraPrep and sterile drapes including sterile stockinette.  With the bed  elevated, the leg was flexed off the side of the table with a lateral  leg post.  Before an incision was made, a time-out was called to  identify the correct patient, correct extremity.  I then made a lateral  incision at the previous arthroscopy portal site and a trocar was  inserted to the knee.  There was an abundant effusion that was drained  from the knee at that point and this was grossly purulent.  The  arthroscope was then inserted and I right away made a medial portal,  then put arthroscopic shaver in her knee and performed an extensive the  debridement in the knee including a synovectomy as well.  I then allowed  several large bags of fluid to lavage through the knee under high  pressure to further irrigate the knee.  All instrumentation sensation  was removed and I closed the portal sites with interrupted 4-0 nylon  suture.  Xeroform followed by well-padded sterile dressing was placed  around the  knee.  The patient was awakened, extubated and taken to recovery room in  stable condition.  Postoperatively I will wait for final cultures on her  knee and will keep her on IV antibiotics until she makes a clinical  improvement  and we have isolation of the cultures.      Vanita Panda. Magnus Ivan, M.D.  Electronically Signed     CYB/MEDQ  D:  04/10/2007  T:  04/10/2007  Job:  [id number]

## 2011-04-29 NOTE — Op Note (Signed)
NAMERACHYL, WUEBKER               ACCOUNT NO.:  [id number]   MEDICAL RECORD NO.:  [id number]          PATIENT TYPE:  AMB   LOCATION:  DSC                          FACILITY:  [facility]   PHYSICIAN:  Vanita Panda. Magnus Ivan, M.D.DATE OF BIRTH:  1951/08/05   DATE OF PROCEDURE:  03/27/2007  DATE OF DISCHARGE:                               OPERATIVE REPORT   PREOPERATIVE NOTE:  Right knee internal derangement.   POSTOPERATIVE DIAGNOSES:  1. Right knee medial and lateral meniscal tears.  2. Grade 4 chondromalacia trochlear groove.  3. Loose bodies right knee.   PROCEDURE:  1. Right knee arthroscopy with debridement.  2. Removal of loose bodies right knee.  3. Partial medial and lateral meniscectomies.  4. Chondroplasty with articular cartilage shaving of trochlear groove.   SURGEON:  Vanita Panda. Magnus Ivan, M.D.   ANESTHESIA:  General.   ANTIBIOTICS:  1 gram IV Ancef.   BLOOD LOSS:  Minimal.   COMPLICATIONS:  None.   INDICATIONS:  Briefly, Ms. Baillargeon is a 60 year old female with myasthenia  gravis who had pain in her right knee for some time.  She had  significant grinding of the patellofemoral joint, and locking and  catching; and she wished to proceed with arthroscopic surgery after we  had tried a series of injections that failed in her knee.  It was  recommended that she undergo arthroscopic surgery with debridement.  She  agreed to proceed with surgery.   PROCEDURE DESCRIPTION:  After informed consent was obtained and the  appropriate right knee was marked, she was brought to the operating  room, and placed supine on the operating table.  General anesthesia was  then obtained; and a nonsterile tourniquet was placed around her upper  right thigh, but was never utilized during the case.  The leg was  prepped and draped with DuraPrep and sterile drapes including sterile  stockinette.   With the bed raised and a lateral leg post, the knee was flexed off the  side of the  table.  A time-out was called and she was identified as the  correct patient and the correct extremity.  I then made an anterolateral  portal just at the level of the inferior pole of the patella and the  lateral sellar tendon; and arthroscope was inserted.  Right away I went  to the medial side and made an anteromedial portal.  There was loose  cartilage bodies floating around the knee and an arthroscopic shaver was  inserted, and these were removed easily. The medial meniscus was probed  and found to have tearing of the central portion of the meniscus with  the posterior horn and the arthroscopic shaver; and up cutting biters  were used to debride this to a stable margin.  The ACL was probed; and  it was found to be intact.  The lateral meniscus was found to have a  central tearing as well; and this was, likewise, debrided back to a  stable margin.   Finally the trochlear groove and the patella was assessed; and there was  grade 4 chondromalacia changes in the large cartilage  deficit in the  trochlear groove.  I used the shaver to perform a chondroplasty removing  loose edges of the articular cartilage to get this back to a stable  margin.  I then allowed fluid to lavage through the knee, and all  instrumentation was removed.  I drained the knee of its effusion.  I  then put a mixture of 1/2% Marcaine into the knee joint as well as  infiltrated the portal sites; and the portal sites were closed with  interrupted 4-0 nylon suture.  Xeroform, followed by a well-padded  sterile dressing was applied and an Ace wrap.   The patient was awakened, extubated, and taken to the recovery room in  stable condition.  Postoperatively I will discharge her from the  recovery room, and let her weightbear as tolerated on this knee with ice  and elevation; and follow up in the office in 2 weeks with a gradual  increase in activities.      Vanita Panda. Magnus Ivan, M.D.  Electronically Signed      CYB/MEDQ  D:  03/27/2007  T:  03/27/2007  Job:  [id number]

## 2011-04-29 NOTE — Consult Note (Signed)
NAMEVERMELL, MADRID               ACCOUNT NO.:  [id number]   MEDICAL RECORD NO.:  [id number]          PATIENT TYPE:  EMS   LOCATION:  MAJO                         FACILITY:  [facility]   PHYSICIAN:  Bevelyn Buckles. Bensimhon, MDDATE OF BIRTH:  Apr 30, 1951   DATE OF CONSULTATION:  04/01/2007  DATE OF DISCHARGE:                                 CONSULTATION   REFERRING PHYSICIAN:  Payton Spark, M.D., Physician's Assistant.   PRIMARY CARE PHYSICIAN:  Dr. Earlene Plater Croward.   REASON FOR CONSULTATION:  Epigastric and chest pain.   Ms. Talwar is a 60 year old woman with a history of diabetes,  hypertension and myasthenia gravis.  She underwent cardiac  catheterization in 2002 for chest pain, which showed normal coronary  arteries.  This past week on Tuesday, she underwent arthroscopic knee  surgery on the right knee.  On Wednesday, she noted chest pain and  epigastric pain with swallowing any food or liquids.  She also feels  that pills get stuck.  Symptoms persisted, to the point where she was  almost unable to eat, until she went to her [facility] yesterday.  She  was sent to the ED.  She had a negative EKG and two sets of negative  cardiac markers.  D-dimer is mildly elevated, and she underwent a CT  scan of the chest which showed no evidence of pulmonary embolus.  There  was some bronchiectasis.  She went home.  Once again today, she tried to  take some apple sauce and had extreme problems swallowing with severe  throat pain.  Denies any nausea and vomiting.  The pain persisted.  She  went back to [facility], and they sent her here for a cardiac  evaluation.   Although she does have problems with knees, she denies any exertional  chest pain or shortness of breath.  She does have some sweating, but it  is not necessary associated with her throat and chest pain.  No bright  red blood per rectum, no melena, no diarrhea.  The remainder of the  review of systems is negative, except for HPI and  problem list.   PROBLEM LIST:  1. History of chest pain.      a.     Normal cardiac catheterization in 2002.  2. Diabetes.  3. Hypertension.  4. Myasthenia gravis.  5. Hypothyroidism.  6. Osteoarthritis, status post right knee arthroscopic surgery.  7. Carpal tunnel syndrome.   CURRENT MEDICATIONS:  1. Mestinon.  2. Cozaar.  3. Glipizide.  4. Glucosamine.  5. Iron.  6. Synthroid.  7. Lipitor.  8. Metformin.  9. Paxil.  10.Percocet, which she has been on since Tuesday from her surgery.   ALLERGIES:  NO KNOWN DRUG ALLERGIES.   SOCIAL HISTORY:  She lives in [place] by herself.  She is retired and  on disability.  She has 3 grown children.   FAMILY HISTORY:  Mother died of cancer, age 76.  Father died of leukemia  at age 39.   PHYSICAL EXAMINATION:  GENERAL:  She is in no acute distress.  VITAL SIGNS:  Blood pressure is 137/87.  Pulse is 76.  Her temperature  is 100.0.  She is satting 97% on room air.  HEENT:  Sclerae is normal.  CARDIAC:  Regular rate and rhythm.  No murmurs, rubs or gallops.  LUNGS:  Clear.  ABDOMEN:  Soft, nontender, nondistended, no hepatosplenomegaly, no  bruits, no masses, good bowel sounds.  EXTREMITIES:  Warm with no cyanosis, clubbing or edema.  Good distal  pulses.   EKG shows normal sinus rhythm at a rate of 70 with no ST/T wave  abnormalities.   LABORATORY:  Labs from yesterday were reviewed but were not re-drawn  today.   ASSESSMENT/PLAN:  Chest/throat and epigastric pain.  This is very  atypical for angina.  She has had a normal catheterization a few years  ago.  There is no objective evidence of ischemia.  Her symptoms are much  more consistent with GI cause.  We have cleared her from a cardiac  perspective and suggested a possible barium swallow, as well as  outpatient followup with GI.  For routine screening purposes, we will  set her up for an outpatient Myoview this week to further evaluate.  These findings have been  communicated to Payton Spark, as well  as the ER team who is comfortable with the plan.      Bevelyn Buckles. Bensimhon, MD  Electronically Signed     DRB/MEDQ  D:  04/01/2007  T:  04/01/2007  Job:  [id number]

## 2011-04-29 NOTE — Cardiovascular Report (Signed)
[employer]  Patient:    Gloria [name], Gloria [name]                      MRN: [id number] Adm. Date:  [id number] Attending:  Virgina Evener CC:         Foye Clock, M.D.  Davis L. Cloward, M.D.   Cardiac Catheterization  INDICATIONS:  Gloria [name] is a 60 year old black female with a family history for coronary artery disease, a history of hypothyroidism, a history of myasthenia gravis, who recently developed several episodes of chest tightness.  A preoperative Cardiolite study prior to carpal tunnel surgery suggested anterior wall thinning in the anteroseptal segment which may be due to significant breast attenuation artifact.  However, there did appear to be additional stress-induced hypoperfusion, most prominently in the mid distal anterior anteroseptal segment.  She ultimately was seen for cardiology consultation.  She is now referred for definitive diagnostic cardiac catheterization.  PROCEDURES:  Left heart catheterization, cine coronary angiography, biplane left cine ventriculography, distal aortography.  HEMODYNAMIC DATA:  Initial blood pressure was 159/87.  Left ventricular pressure was 159/19.  On pullback, left ventricular pressure was 181/29. Central aortic pressure was 181/100.  ANGIOGRAPHIC DATA: 1. The left main coronary artery was angiographically normal and bifurcated    into an LAD and left circumflex system. 2. The LAD was angiographically normal and gave rise to several septal    perforating arteries and several diagonal vessels. 3. The circumflex vessel gave rise to a very prominent first marginal vessel    which had an intermedius-like distribution.  This intermedius-like vessel    gave rise to several branches and supplied the inferoapical segment. 4. The right coronary artery was a nondominant, angiographically normal    vessel.  Biplane cine left ventriculography revealed normal LV function.  Distal aortography  did not demonstrate any significant aortoiliac disease.  IMPRESSION: 1. Normal LV function. 2. Normal coronary arteries. DD:  04/02/01 TD:  04/02/01 Job: [id number]

## 2011-05-02 ENCOUNTER — Other Ambulatory Visit: Payer: Self-pay | Admitting: Internal Medicine

## 2011-08-23 ENCOUNTER — Other Ambulatory Visit: Payer: Self-pay | Admitting: Family Medicine

## 2011-08-23 DIAGNOSIS — Z1231 Encounter for screening mammogram for malignant neoplasm of breast: Secondary | ICD-10-CM

## 2011-09-13 ENCOUNTER — Ambulatory Visit
Admission: RE | Admit: 2011-09-13 | Discharge: 2011-09-13 | Disposition: A | Discharge status: Home / Self Care | Payer: Medicare Other | Source: Ambulatory Visit | Attending: Family Medicine | Admitting: Family Medicine

## 2011-09-13 DIAGNOSIS — Z1231 Encounter for screening mammogram for malignant neoplasm of breast: Secondary | ICD-10-CM

## 2012-02-06 ENCOUNTER — Other Ambulatory Visit: Payer: Self-pay | Admitting: Internal Medicine

## 2012-02-06 ENCOUNTER — Other Ambulatory Visit (INDEPENDENT_AMBULATORY_CARE_PROVIDER_SITE_OTHER): Payer: Medicare Other

## 2012-02-06 ENCOUNTER — Ambulatory Visit (INDEPENDENT_AMBULATORY_CARE_PROVIDER_SITE_OTHER): Payer: Medicare Other | Admitting: Internal Medicine

## 2012-02-06 ENCOUNTER — Encounter: Payer: Self-pay | Admitting: Internal Medicine

## 2012-02-06 VITALS — BP 140/84 | HR 58 | Temp 97.5°F | Ht 66.5 in | Wt 169.0 lb

## 2012-02-06 DIAGNOSIS — E785 Hyperlipidemia, unspecified: Secondary | ICD-10-CM

## 2012-02-06 DIAGNOSIS — E119 Type 2 diabetes mellitus without complications: Secondary | ICD-10-CM

## 2012-02-06 DIAGNOSIS — Z0000 Encounter for general adult medical examination without abnormal findings: Secondary | ICD-10-CM

## 2012-02-06 DIAGNOSIS — E039 Hypothyroidism, unspecified: Secondary | ICD-10-CM

## 2012-02-06 DIAGNOSIS — I10 Essential (primary) hypertension: Secondary | ICD-10-CM

## 2012-02-06 LAB — LIPID PANEL
Cholesterol: 146 mg/dL (ref 0–200)
HDL: 51.4 mg/dL (ref >39.00)
LDL Cholesterol: 75 mg/dL (ref 0–99)
Total CHOL/HDL Ratio: 3
Triglycerides: 97 mg/dL (ref 0.0–149.0)
VLDL: 19.4 mg/dL (ref 0.0–40.0)

## 2012-02-06 LAB — HEMOGLOBIN A1C: Hgb A1c MFr Bld: 5.7 % (ref 4.6–6.5)

## 2012-02-06 LAB — TSH: TSH: 0.84 u[IU]/mL (ref 0.35–5.50)

## 2012-02-06 MED ORDER — AMLODIPINE BESYLATE 5 MG PO TABS: 5.0000 mg | ORAL_TABLET | Freq: Every day | ORAL | Status: DC

## 2012-02-06 NOTE — Assessment & Plan Note (Signed)
Previously on glipized in addition to metformin - but monotherapy only since 12/2010 Check a1c now and every 6 mo Follows with optho annually ([facility]) - no DM changes noted Lab Results  Component Value Date   HGBA1C 5.8 02/11/2011

## 2012-02-06 NOTE — Assessment & Plan Note (Signed)
On low dose atorva - check lipids now

## 2012-02-06 NOTE — Progress Notes (Signed)
Subjective:    Patient ID: Gloria [name], female    DOB: Jun 08, 1951, 61 y.o.   MRN: [id number]  HPI  Here for medicare wellness "physical" and review -  Diet: heart healthy + DM if diabetic Physical activity: indep, active Depression/mood screen: negative Hearing: intact to whispered voice Visual acuity: grossly normal, performs annual eye exam  ADLs: capable Fall risk: none Home safety: good Cognitive evaluation: intact to orientation, naming, recall and repetition EOL planning: adv directives, full code/ I agree  I have personally reviewed and have noted 1. The patient's medical and social history 2. Their use of alcohol, tobacco or illicit drugs 3. Their current medications and supplements 4. The patient's functional ability including ADL's, fall risks, home safety risks and hearing or visual impairment. 5. Diet and physical activities 6. Evidence for depression or mood disorders   Reports she still wants to "be off all my medications"   also reviewed chronic medical issues -   myasthenia gravis - dx age 67y - follows with neuro at [facility] annually for same - no recent flares or exac -reports compliance with ongoing medical treatment and no changes in medication dose or frequency. denies adverse side effects related to current therapy.     DM2 -  dx 02/2005- reports compliance with ongoing medical treatment and no changes in medication dose or frequency. denies adverse side effects related to current therapy.     HTN - reports compliance with ongoing medical treatment and no changes in medication dose or frequency. denies adverse side effects related to current therapy.     dyslipidemia - reports compliance with ongoing medical treatment and no changes in medication dose or frequency. denies adverse side effects related to current therapy.     hypothyroid - reports compliance with ongoing medical treatment and no changes in medication dose or frequency. denies adverse side  effects related to current therapy.     Past Medical History  Diagnosis Date  . Myasthenia gravis   . Diabetes mellitus, type 2   . Hypertension   . Dyslipidemia   . Hypothyroid   . Alopecia areata 10/2010 onset   Family History  Problem Relation Age of Onset  . Adopted: Yes   History  Substance Use Topics  . Smoking status: Never Smoker   . Smokeless tobacco: Not on file  . Alcohol Use: No    Review of Systems  Constitutional: Negative for fatigue and unexpected weight change.  Cardiovascular: Negative for chest pain and palpitations.  Genitourinary: Positive for urgency and frequency.  Neurological: Negative for dizziness or headache.  No other specific complaints in a complete review of systems (except as listed in HPI above).      Objective:   Physical Exam BP 140/84  Pulse 58  Temp(Src) 97.5 F (36.4 C) (Oral)  Ht 5' 6.5" (1.689 m)  Wt 169 lb (76.658 kg)  BMI 26.87 kg/m2  SpO2 99% Wt Readings from Last 3 Encounters:  02/06/12 169 lb (76.658 kg)  02/11/11 170 lb (77.111 kg)  12/31/10 172 lb 6.4 oz (78.2 kg)   Constitutional: She appears well-developed and well-nourished. No distress.  HENT: Head: Normocephalic and atraumatic. Ears: B TMs ok, no erythema or effusion; Nose: Nose normal. Mouth/Throat: Oropharynx is clear and moist. No oropharyngeal exudate.  Eyes: Conjunctivae and EOM are normal. Pupils are equal, round, and reactive to light. No scleral icterus.  Neck: Normal range of motion. Neck supple. No JVD present. No thyromegaly present.  Cardiovascular: Normal rate, regular  rhythm and normal heart sounds.  No murmur heard. No BLE edema. Pulmonary/Chest: Effort normal and breath sounds normal. No respiratory distress. She has no wheezes.  Abdominal: Soft. Bowel sounds are normal. She exhibits no distension. There is no tenderness. no masses Musculoskeletal: Normal range of motion, no joint effusions. No gross deformities Neurological: She is alert and  oriented to person, place, and time. No cranial nerve deficit. Coordination normal.  Skin: scalp with alopecia changes, thinning - Skin is warm and dry. No rash noted. No erythema.  Psychiatric: She has a normal mood and affect. Her behavior is normal. Judgment and thought content normal.   Lab Results  Component Value Date   WBC 5.8 08/25/2010   HGB 12.8 08/25/2010   HCT 37.6 08/25/2010   PLT 215 08/25/2010   GLUCOSE 89 08/25/2010   CHOL 146 02/06/2012   TRIG 97.0 02/06/2012   HDL 51.40 02/06/2012   LDLCALC 75 02/06/2012   NA 140 08/25/2010   K 4.4 08/25/2010   CL 106 08/25/2010   CREATININE 0.86 08/25/2010   BUN 8 08/25/2010   CO2 106 08/25/2010   TSH 0.84 02/06/2012   HGBA1C 5.7 02/06/2012       Assessment & Plan:  AWV - v70.0 - Today patient counseled on age appropriate routine health concerns for screening and prevention, each reviewed and up to date or declined. Immunizations reviewed and up to date or declined. Labs ordered and will be reviewed. Risk factors for depression reviewed and negative. Hearing function and visual acuity are intact. ADLs screened and addressed as needed. Functional ability and level of safety reviewed and appropriate. Education, counseling and referrals performed based on assessed risks today. Patient provided with a copy of personalized plan for preventive services.  Also see problem list. Medications and labs reviewed today.

## 2012-02-06 NOTE — Patient Instructions (Signed)
It was good to see you today. Test(s) ordered today. Your results will be called to you after review (48-72hours after test completion). If any changes need to be made, you will be notified at that time. Medications reviewed, added amlodipine 5 mg daily to current medications - no other changes at this time. Your prescription(s) have been submitted to your pharmacy. Please take as directed and contact our office if you believe you are having problem(s) with the medication(s). Health Maintenance reviewed - please ask your GI physicians to send [place] a copy of colonoscopy done in 2012, followup with gynecologist every 2-3 years, everything else is up to date. Please schedule followup in 6-12 months for diabetes mellitus check and review, call sooner if problems.

## 2012-02-06 NOTE — Assessment & Plan Note (Signed)
BP Readings from Last 3 Encounters:  02/06/12 140/84  02/11/11 120/84  12/31/10 120/78   Previously on amlodipine 5 in addition to ARB - resume same now for improved BP control

## 2012-02-06 NOTE — Assessment & Plan Note (Signed)
Lab Results  Component Value Date   TSH 0.97 02/11/2011   Check now and adjust dose as needed

## 2012-02-21 ENCOUNTER — Other Ambulatory Visit: Payer: Self-pay | Admitting: Internal Medicine

## 2012-02-21 MED ORDER — LEVOTHYROXINE SODIUM 75 MCG PO TABS: 75.0000 ug | ORAL_TABLET | Freq: Every day | ORAL | Status: DC

## 2012-02-21 NOTE — Telephone Encounter (Signed)
Ok to fill - ?30d or 90d supply ok with me for 1 year supply - thanks

## 2012-02-21 NOTE — Telephone Encounter (Signed)
PT NEEDS AN RX CALLED IN FOR LEVOTHYROXINE.  THIS HAS NOT BEEN WRITTEN BY DR LESCHBER YET.  SHE USES [facility] ON [address].

## 2012-03-20 ENCOUNTER — Other Ambulatory Visit: Payer: Self-pay | Admitting: Internal Medicine

## 2012-08-15 ENCOUNTER — Other Ambulatory Visit: Payer: Self-pay | Admitting: Family Medicine

## 2012-08-15 ENCOUNTER — Other Ambulatory Visit: Payer: Self-pay | Admitting: Internal Medicine

## 2012-08-15 DIAGNOSIS — Z1231 Encounter for screening mammogram for malignant neoplasm of breast: Secondary | ICD-10-CM

## 2012-08-29 ENCOUNTER — Other Ambulatory Visit: Payer: Self-pay | Admitting: General Practice

## 2012-08-29 MED ORDER — LEVOTHYROXINE SODIUM 75 MCG PO TABS: 75.0000 ug | ORAL_TABLET | Freq: Every day | ORAL | Status: DC

## 2012-09-05 ENCOUNTER — Other Ambulatory Visit (INDEPENDENT_AMBULATORY_CARE_PROVIDER_SITE_OTHER): Payer: Medicare Other

## 2012-09-05 ENCOUNTER — Ambulatory Visit (INDEPENDENT_AMBULATORY_CARE_PROVIDER_SITE_OTHER): Payer: Medicare Other | Admitting: Internal Medicine

## 2012-09-05 ENCOUNTER — Encounter: Payer: Self-pay | Admitting: Internal Medicine

## 2012-09-05 VITALS — BP 132/86 | HR 53 | Temp 97.4°F | Resp 16 | Ht 66.0 in | Wt 167.5 lb

## 2012-09-05 DIAGNOSIS — I10 Essential (primary) hypertension: Secondary | ICD-10-CM

## 2012-09-05 DIAGNOSIS — E039 Hypothyroidism, unspecified: Secondary | ICD-10-CM

## 2012-09-05 DIAGNOSIS — R252 Cramp and spasm: Secondary | ICD-10-CM

## 2012-09-05 DIAGNOSIS — E785 Hyperlipidemia, unspecified: Secondary | ICD-10-CM

## 2012-09-05 DIAGNOSIS — E119 Type 2 diabetes mellitus without complications: Secondary | ICD-10-CM

## 2012-09-05 DIAGNOSIS — Z23 Encounter for immunization: Secondary | ICD-10-CM

## 2012-09-05 LAB — TSH: TSH: 0.06 u[IU]/mL — ABNORMAL LOW (ref 0.35–5.50)

## 2012-09-05 LAB — BASIC METABOLIC PANEL
BUN: 10 mg/dL (ref 6–23)
CO2: 31 mEq/L (ref 19–32)
Calcium: 9.5 mg/dL (ref 8.4–10.5)
Chloride: 103 mEq/L (ref 96–112)
Creatinine, Ser: 0.9 mg/dL (ref 0.4–1.2)
GFR: 80.8 mL/min (ref >60.00)
Glucose, Bld: 81 mg/dL (ref 70–99)
Potassium: 3.8 mEq/L (ref 3.5–5.1)
Sodium: 141 mEq/L (ref 135–145)

## 2012-09-05 LAB — HEMOGLOBIN A1C: Hgb A1c MFr Bld: 5.7 % (ref 4.6–6.5)

## 2012-09-05 LAB — HEPATIC FUNCTION PANEL
ALT: 16 U/L (ref 0–35)
AST: 24 U/L (ref 0–37)
Albumin: 4.1 g/dL (ref 3.5–5.2)
Alkaline Phosphatase: 63 U/L (ref 39–117)
Bilirubin, Direct: 0 mg/dL (ref 0.0–0.3)
Total Bilirubin: 0.7 mg/dL (ref 0.3–1.2)
Total Protein: 7.3 g/dL (ref 6.0–8.3)

## 2012-09-05 MED ORDER — ATORVASTATIN CALCIUM 10 MG PO TABS: 5.0000 mg | ORAL_TABLET | Freq: Every day | ORAL | Status: DC

## 2012-09-05 MED ORDER — METFORMIN HCL ER 500 MG PO TB24: 500.0000 mg | ORAL_TABLET | Freq: Every day | ORAL | Status: DC

## 2012-09-05 MED ORDER — CYCLOBENZAPRINE HCL 5 MG PO TABS: 5.0000 mg | ORAL_TABLET | Freq: Three times a day (TID) | ORAL | Status: DC | PRN

## 2012-09-05 MED ORDER — LOSARTAN POTASSIUM 100 MG PO TABS: 100.0000 mg | ORAL_TABLET | Freq: Every day | ORAL | Status: DC

## 2012-09-05 NOTE — Assessment & Plan Note (Signed)
On low dose atorva - check lipids annually Last lipids reviewed and at goal The current medical regimen is effective;  continue present plan and medications. 

## 2012-09-05 NOTE — Assessment & Plan Note (Signed)
Lab Results  Component Value Date   TSH 0.84 02/06/2012   Check now and adjust dose as needed

## 2012-09-05 NOTE — Progress Notes (Signed)
  Subjective:    Patient ID: Gloria [name], female    DOB: [birth year], 61 y.o.   MRN: [id number]  HPI  Here for follow up - reviewed chronic medical issues:  Reports her ultimate goal is "to be off all my medications"  myasthenia gravis - dx age 74y - follows with neuro at [facility] annually for same - no recent flares or exac -reports compliance with ongoing medical treatment and no changes in medication dose or frequency. denies adverse side effects related to current therapy.     DM2 -  dx 02/2005- reports compliance with ongoing medical treatment and no changes in medication dose or frequency. denies adverse side effects related to current therapy.     HTN - reports compliance with ongoing medical treatment and no changes in medication dose or frequency. denies adverse side effects related to current therapy.     dyslipidemia - reports compliance with ongoing medical treatment and no changes in medication dose or frequency. denies adverse side effects related to current therapy.     hypothyroid - reports compliance with ongoing medical treatment and no changes in medication dose or frequency. denies adverse side effects related to current therapy.     Past Medical History  Diagnosis Date  . Myasthenia gravis   . Diabetes mellitus, type 2   . Hypertension   . Dyslipidemia   . Hypothyroid   . Alopecia areata 10/2010 onset    Review of Systems  Constitutional: Negative for fatigue and unexpected weight change.  Respiratory: Negative for cough and shortness of breath.   Cardiovascular: Negative for chest pain and palpitations.        Objective:   Physical Exam  BP 132/86  Pulse 53  Temp 97.4 F (36.3 C) (Oral)  Resp 16  Ht 5\' 6"  (1.676 m)  Wt 167 lb 8 oz (75.978 kg)  BMI 27.04 kg/m2  SpO2 99% Wt Readings from Last 3 Encounters:  09/05/12 167 lb 8 oz (75.978 kg)  02/06/12 169 lb (76.658 kg)  02/11/11 170 lb (77.111 kg)   Constitutional: She appears well-developed  and well-nourished. No distress.  Neck: Normal range of motion. Neck supple. No JVD present. No thyromegaly present.  Cardiovascular: Normal rate, regular rhythm and normal heart sounds.  No murmur heard. No BLE edema. Pulmonary/Chest: Effort normal and breath sounds normal. No respiratory distress. She has no wheezes. Musculoskeletal: spasm in cervical spine. Normal range of motion, no joint effusions. No gross deformities Neurological: She is alert and oriented to person, place, and time. No cranial nerve deficit. Coordination normal.  Psychiatric: She has a normal mood and affect. Her behavior is normal. Judgment and thought content normal.   Lab Results  Component Value Date   WBC 5.8 08/25/2010   HGB 12.8 08/25/2010   HCT 37.6 08/25/2010   PLT 215 08/25/2010   GLUCOSE 89 08/25/2010   CHOL 146 02/06/2012   TRIG 97.0 02/06/2012   HDL 51.40 02/06/2012   LDLCALC 75 02/06/2012   NA 140 08/25/2010   K 4.4 08/25/2010   CL 106 08/25/2010   CREATININE 0.86 08/25/2010   BUN 8 08/25/2010   CO2 106 08/25/2010   TSH 0.84 02/06/2012   HGBA1C 5.7 02/06/2012       Assessment & Plan:   Muscle spasm - flexeril prn - erx done - check lytes  Also see problem list. Medications and labs reviewed today.

## 2012-09-05 NOTE — Assessment & Plan Note (Signed)
BP Readings from Last 3 Encounters:  09/05/12 132/86  02/06/12 140/84  02/11/11 120/84   Previously on amlodipine 5 in addition to ARB - resume same now for improved BP control

## 2012-09-05 NOTE — Patient Instructions (Signed)
It was good to see you today. Medications reviewed and updated, no new changes at this time. Your prescription(s) have been submitted to your pharmacy. Please take as directed and contact our office if you believe you are having problem(s) with the medication(s). Health Maintenance reviewed - flu shot and pneumonia shot updated today - Please schedule followup in 6-12 months for diabetes mellitus check and review, call sooner if problems.

## 2012-09-05 NOTE — Assessment & Plan Note (Signed)
Previously on glipized in addition to metformin - but monotherapy only since 12/2010 Check a1c now and every 6 mo Follows with optho annually ([facility]) - no DM changes noted Lab Results  Component Value Date   HGBA1C 5.7 02/06/2012

## 2012-09-06 ENCOUNTER — Telehealth: Payer: Self-pay | Admitting: Internal Medicine

## 2012-09-06 MED ORDER — LEVOTHYROXINE SODIUM 50 MCG PO TABS: 50.0000 ug | ORAL_TABLET | Freq: Every day | ORAL | Status: DC

## 2012-09-06 NOTE — Addendum Note (Signed)
Addended by: Rene Paci A on: 09/06/2012 02:17 PM   Modules accepted: Orders

## 2012-09-06 NOTE — Telephone Encounter (Signed)
Caller: Gloria [name]/Patient; Phone: [phone]; Reason for Call: Patient calling regarding a prescription Dr.  Felicity Coyer had called in yesterday to [facility] on [address].  States the Pharmacy is needing prior authorization.  Please call the patient back.  Thanks

## 2012-09-07 NOTE — Telephone Encounter (Signed)
We are aware and PA has been started.

## 2012-09-13 ENCOUNTER — Ambulatory Visit
Admission: RE | Admit: 2012-09-13 | Discharge: 2012-09-13 | Disposition: A | Discharge status: Home / Self Care | Payer: Medicare Other | Source: Ambulatory Visit | Attending: Internal Medicine | Admitting: Internal Medicine

## 2012-09-13 DIAGNOSIS — Z1231 Encounter for screening mammogram for malignant neoplasm of breast: Secondary | ICD-10-CM

## 2013-03-05 ENCOUNTER — Other Ambulatory Visit: Payer: Self-pay | Admitting: *Deleted

## 2013-03-05 MED ORDER — METFORMIN HCL ER 500 MG PO TB24: 500.0000 mg | ORAL_TABLET | Freq: Every day | ORAL | Status: DC

## 2013-03-06 ENCOUNTER — Encounter: Payer: Self-pay | Admitting: Internal Medicine

## 2013-03-06 ENCOUNTER — Ambulatory Visit (INDEPENDENT_AMBULATORY_CARE_PROVIDER_SITE_OTHER): Payer: Medicare Other | Admitting: Internal Medicine

## 2013-03-06 ENCOUNTER — Other Ambulatory Visit (INDEPENDENT_AMBULATORY_CARE_PROVIDER_SITE_OTHER): Payer: Medicare Other

## 2013-03-06 VITALS — BP 120/72 | HR 59 | Temp 98.1°F | Wt 170.0 lb

## 2013-03-06 DIAGNOSIS — E039 Hypothyroidism, unspecified: Secondary | ICD-10-CM

## 2013-03-06 DIAGNOSIS — I10 Essential (primary) hypertension: Secondary | ICD-10-CM

## 2013-03-06 DIAGNOSIS — E119 Type 2 diabetes mellitus without complications: Secondary | ICD-10-CM

## 2013-03-06 DIAGNOSIS — E785 Hyperlipidemia, unspecified: Secondary | ICD-10-CM

## 2013-03-06 LAB — TSH: TSH: 3.36 u[IU]/mL (ref 0.35–5.50)

## 2013-03-06 LAB — HEMOGLOBIN A1C: Hgb A1c MFr Bld: 5.7 % (ref 4.6–6.5)

## 2013-03-06 NOTE — Patient Instructions (Signed)
It was good to see you today. Medications reviewed and updated, no new changes at this time. Test(s) ordered today. Your results will be released to MyChart (or called to you) after review, usually within 72hours after test completion. If any changes need to be made, you will be notified at that same time. Please schedule followup in 6 months for annual visit to include cholesterol, thyroid and diabetes mellitus check, call sooner if problems.

## 2013-03-06 NOTE — Assessment & Plan Note (Signed)
Previously on glipize in addition to metformin - but monotherapy only since 12/2010 Reduced dose metformin 2013 to qd Check a1c now and every 6 mo Follows with optho annually (yokum) - no DM changes noted Lab Results  Component Value Date   HGBA1C 5.7 09/05/2012

## 2013-03-06 NOTE — Assessment & Plan Note (Signed)
Lab Results  Component Value Date   TSH 0.06* 09/05/2012   Check now and adjust dose as needed

## 2013-03-06 NOTE — Progress Notes (Signed)
  Subjective:    Patient ID: Gloria [name], female    DOB: 28-Sep-1951, 62 y.o.   MRN: [id number]  HPI  Here for follow up - reviewed chronic medical issues:  myasthenia gravis - dx age 46y - follows with neuro at [facility] annually for same - no recent flares or exac -reports compliance with ongoing medical treatment and no changes in medication dose or frequency. denies adverse side effects related to current therapy.     DM2 - dx 02/2005- reports compliance with ongoing medical treatment and no changes in medication dose or frequency. denies adverse side effects related to current therapy.     HTN - reports compliance with ongoing medical treatment and no changes in medication dose or frequency. denies adverse side effects related to current therapy.     dyslipidemia - reports compliance with ongoing medical treatment and no changes in medication dose or frequency. denies adverse side effects related to current therapy.     hypothyroid - reports compliance with ongoing medical treatment - reviewed medication dose adjustments. denies adverse side effects related to current therapy.     Past Medical History  Diagnosis Date  . Myasthenia gravis   . Diabetes mellitus, type 2   . Hypertension   . Dyslipidemia   . Hypothyroid   . Alopecia areata 10/2010 onset    Review of Systems  Constitutional: Negative for fatigue and unexpected weight change.  Respiratory: Negative for cough and shortness of breath.   Cardiovascular: Negative for chest pain and palpitations.        Objective:   Physical Exam  BP 120/72  Pulse 59  Temp(Src) 98.1 F (36.7 C) (Oral)  Wt 170 lb (77.111 kg)  BMI 27.45 kg/m2  SpO2 98% Wt Readings from Last 3 Encounters:  03/06/13 170 lb (77.111 kg)  09/05/12 167 lb 8 oz (75.978 kg)  02/06/12 169 lb (76.658 kg)   Constitutional: She appears well-developed and well-nourished. No distress.  Neck: Normal range of motion. Neck supple. No JVD present. No  thyromegaly present.  Cardiovascular: Normal rate, regular rhythm and normal heart sounds.  No murmur heard. No BLE edema. Pulmonary/Chest: Effort normal and breath sounds normal. No respiratory distress. She has no wheezes. Neurological: She is alert and oriented to person, place, and time. No cranial nerve deficit. Coordination normal.  Psychiatric: She has a normal mood and affect. Her behavior is normal. Judgment and thought content normal.   Lab Results  Component Value Date   WBC 5.8 08/25/2010   HGB 12.8 08/25/2010   HCT 37.6 08/25/2010   PLT 215 08/25/2010   GLUCOSE 81 09/05/2012   CHOL 146 02/06/2012   TRIG 97.0 02/06/2012   HDL 51.40 02/06/2012   LDLCALC 75 02/06/2012   ALT 16 09/05/2012   AST 24 09/05/2012   NA 141 09/05/2012   K 3.8 09/05/2012   CL 103 09/05/2012   CREATININE 0.9 09/05/2012   BUN 10 09/05/2012   CO2 31 09/05/2012   TSH 0.06* 09/05/2012   HGBA1C 5.7 09/05/2012       Assessment & Plan:   see problem list. Medications and labs reviewed today.

## 2013-03-06 NOTE — Assessment & Plan Note (Signed)
BP Readings from Last 3 Encounters:  03/06/13 120/72  09/05/12 132/86  02/06/12 140/84   on amlodipine Resumed ARB 08/2012 for improved BP control The current medical regimen is effective;  continue present plan and medications.

## 2013-03-06 NOTE — Assessment & Plan Note (Signed)
On low dose atorva - check lipids annually Last lipids reviewed and at goal The current medical regimen is effective;  continue present plan and medications. 

## 2013-03-07 ENCOUNTER — Telehealth: Payer: Self-pay | Admitting: Internal Medicine

## 2013-03-07 DIAGNOSIS — G5601 Carpal tunnel syndrome, right upper limb: Secondary | ICD-10-CM | POA: Insufficient documentation

## 2013-03-07 NOTE — Telephone Encounter (Signed)
Notified pt with lab results. Pt also wanted to ask md how long it would take her heel to heal. ? If its tinnitis or tear. Was told to stay off but was requesting more advisement...Raechel Chute

## 2013-03-07 NOTE — Telephone Encounter (Signed)
Called pt no answer LMOM with md response.../lmb 

## 2013-03-07 NOTE — Telephone Encounter (Signed)
No evidence for tear on exam, symptoms consistent with Achilles tendinitis -this will take up to 12 weeks to heal. Continue ibuprofen as we discussed at office visit as needed until that time. Slowly increase activity over next 3 months -may resume normal activity once pain has resolved

## 2013-03-07 NOTE — Telephone Encounter (Signed)
Patient was here for visit yesterday and she forgot to ask how long it would take for her heel to heal?

## 2013-03-11 ENCOUNTER — Other Ambulatory Visit: Payer: Self-pay | Admitting: Internal Medicine

## 2013-06-03 LAB — HM DIABETES EYE EXAM

## 2013-06-07 ENCOUNTER — Encounter: Payer: Self-pay | Admitting: Internal Medicine

## 2013-06-12 ENCOUNTER — Encounter: Payer: Self-pay | Admitting: Internal Medicine

## 2013-07-22 ENCOUNTER — Ambulatory Visit (INDEPENDENT_AMBULATORY_CARE_PROVIDER_SITE_OTHER): Payer: Medicare Other | Admitting: Internal Medicine

## 2013-07-22 ENCOUNTER — Other Ambulatory Visit (INDEPENDENT_AMBULATORY_CARE_PROVIDER_SITE_OTHER): Payer: Medicare Other

## 2013-07-22 ENCOUNTER — Encounter: Payer: Self-pay | Admitting: Internal Medicine

## 2013-07-22 VITALS — BP 132/82 | HR 62 | Temp 98.0°F | Wt 171.0 lb

## 2013-07-22 DIAGNOSIS — M62838 Other muscle spasm: Secondary | ICD-10-CM

## 2013-07-22 DIAGNOSIS — E119 Type 2 diabetes mellitus without complications: Secondary | ICD-10-CM

## 2013-07-22 DIAGNOSIS — G2581 Restless legs syndrome: Secondary | ICD-10-CM

## 2013-07-22 DIAGNOSIS — E785 Hyperlipidemia, unspecified: Secondary | ICD-10-CM

## 2013-07-22 DIAGNOSIS — E039 Hypothyroidism, unspecified: Secondary | ICD-10-CM

## 2013-07-22 LAB — CBC WITH DIFFERENTIAL/PLATELET
Basophils Absolute: 0 10*3/uL (ref 0.0–0.1)
Basophils Relative: 0.5 % (ref 0.0–3.0)
Eosinophils Absolute: 0.7 10*3/uL (ref 0.0–0.7)
Eosinophils Relative: 9.8 % — ABNORMAL HIGH (ref 0.0–5.0)
HCT: 37.8 % (ref 36.0–46.0)
Hemoglobin: 12.5 g/dL (ref 12.0–15.0)
Lymphocytes Relative: 25.5 % (ref 12.0–46.0)
Lymphs Abs: 1.7 10*3/uL (ref 0.7–4.0)
MCHC: 33.1 g/dL (ref 30.0–36.0)
MCV: 93.5 fl (ref 78.0–100.0)
Monocytes Absolute: 0.4 10*3/uL (ref 0.1–1.0)
Monocytes Relative: 6.4 % (ref 3.0–12.0)
Neutro Abs: 3.9 10*3/uL (ref 1.4–7.7)
Neutrophils Relative %: 57.8 % (ref 43.0–77.0)
Platelets: 218 10*3/uL (ref 150.0–400.0)
RBC: 4.04 Mil/uL (ref 3.87–5.11)
RDW: 13.3 % (ref 11.5–14.6)
WBC: 6.7 10*3/uL (ref 4.5–10.5)

## 2013-07-22 LAB — BASIC METABOLIC PANEL
BUN: 8 mg/dL (ref 6–23)
CO2: 29 mEq/L (ref 19–32)
Calcium: 9.5 mg/dL (ref 8.4–10.5)
Chloride: 105 mEq/L (ref 96–112)
Creatinine, Ser: 1 mg/dL (ref 0.4–1.2)
GFR: 73.96 mL/min (ref >60.00)
Glucose, Bld: 96 mg/dL (ref 70–99)
Potassium: 4.2 mEq/L (ref 3.5–5.1)
Sodium: 140 mEq/L (ref 135–145)

## 2013-07-22 LAB — LIPID PANEL
Cholesterol: 170 mg/dL (ref 0–200)
HDL: 49.2 mg/dL (ref >39.00)
LDL Cholesterol: 93 mg/dL (ref 0–99)
Total CHOL/HDL Ratio: 3
Triglycerides: 139 mg/dL (ref 0.0–149.0)
VLDL: 27.8 mg/dL (ref 0.0–40.0)

## 2013-07-22 LAB — HEMOGLOBIN A1C: Hgb A1c MFr Bld: 5.7 % (ref 4.6–6.5)

## 2013-07-22 LAB — TSH: TSH: 3.09 u[IU]/mL (ref 0.35–5.50)

## 2013-07-22 LAB — FERRITIN: Ferritin: 109.6 ng/mL (ref 10.0–291.0)

## 2013-07-22 LAB — SEDIMENTATION RATE: Sed Rate: 11 mm/hr (ref 0–22)

## 2013-07-22 MED ORDER — CYCLOBENZAPRINE HCL 5 MG PO TABS: 5.0000 mg | ORAL_TABLET | Freq: Three times a day (TID) | ORAL | Status: AC | PRN

## 2013-07-22 NOTE — Assessment & Plan Note (Signed)
On low dose atorva - check lipids annually Last lipids reviewed and at goal The current medical regimen is effective;  continue present plan and medications. 

## 2013-07-22 NOTE — Progress Notes (Signed)
Subjective:    Patient ID: Gloria [name], female    DOB: September 16, 1951, 62 y.o.   MRN: [id number]  HPI  Here for R leg pain - "spasm" in R ham and calf, improved Precipitated by overuse in pool No weakness or injury WB activity ok symptoms most noted at rest  Also reviewed chronic medical issues:  myasthenia gravis - dx age 80y - follows with neuro at [facility] annually for same - no recent flares or exac -reports compliance with ongoing medical treatment and no changes in medication dose or frequency. denies adverse side effects related to current therapy.     DM2 - dx 02/2005- reports compliance with ongoing medical treatment and no changes in medication dose or frequency. denies adverse side effects related to current therapy.     HTN - reports compliance with ongoing medical treatment and no changes in medication dose or frequency. denies adverse side effects related to current therapy.     dyslipidemia - reports compliance with ongoing medical treatment and no changes in medication dose or frequency. denies adverse side effects related to current therapy.     hypothyroid - reports compliance with ongoing medical treatment - reviewed medication dose adjustments. denies adverse side effects related to current therapy.     Past Medical History  Diagnosis Date  . Myasthenia gravis   . Diabetes mellitus, type 2   . Hypertension   . Dyslipidemia   . Hypothyroid   . Alopecia areata 10/2010 onset    Review of Systems  Constitutional: Negative for fatigue and unexpected weight change.  Respiratory: Negative for cough and shortness of breath.   Cardiovascular: Negative for chest pain and palpitations.        Objective:   Physical Exam  BP 152/90  Pulse 62  Temp(Src) 98 F (36.7 C) (Oral)  Wt 171 lb (77.565 kg)  BMI 27.61 kg/m2  SpO2 99% Wt Readings from Last 3 Encounters:  07/22/13 171 lb (77.565 kg)  03/06/13 170 lb (77.111 kg)  09/05/12 167 lb 8 oz (75.978 kg)    Constitutional: She appears well-developed and well-nourished. No distress.  Neck: Normal range of motion. Neck supple. No JVD present. No thyromegaly present.  Cardiovascular: Normal rate, regular rhythm and normal heart sounds.  No murmur heard. No BLE edema. Pulmonary/Chest: Effort normal and breath sounds normal. No respiratory distress. She has no wheezes. MSkel: Back: full range of motion of thoracic and lumbar spine. Non tender to palpation. Negative straight leg raise. DTR's are symmetrically intact. Sensation intact in all dermatomes of the lower extremities. Full strength to manual muscle testing. patient is able to heel toe walk without difficulty and ambulates with antalgic gait. Neurological: She is alert and oriented to person, place, and time. No cranial nerve deficit. Coordination normal.  Psychiatric: She has a normal mood and affect. Her behavior is normal. Judgment and thought content normal.   Lab Results  Component Value Date   WBC 5.8 08/25/2010   HGB 12.8 08/25/2010   HCT 37.6 08/25/2010   PLT 215 08/25/2010   GLUCOSE 81 09/05/2012   CHOL 146 02/06/2012   TRIG 97.0 02/06/2012   HDL 51.40 02/06/2012   LDLCALC 75 02/06/2012   ALT 16 09/05/2012   AST 24 09/05/2012   NA 141 09/05/2012   K 3.8 09/05/2012   CL 103 09/05/2012   CREATININE 0.9 09/05/2012   BUN 10 09/05/2012   CO2 31 09/05/2012   TSH 3.36 03/06/2013   HGBA1C 5.7 03/06/2013  Assessment & Plan:   Muscle spasm, R ham - neuro and Mskel exam intact, hx benign Check labs - iron, lytes, ESR Muscle relaxant qhs and prn - erx done   Also see problem list. Medications and labs reviewed today.

## 2013-07-22 NOTE — Assessment & Plan Note (Signed)
Previously on glipize in addition to metformin - but monotherapy only since 12/2010 Reduced dose metformin 2013 to qd Check a1c now and every 6 mo Follows with optho annually ([facility]) - no DM changes noted Lab Results  Component Value Date   HGBA1C 5.7 03/06/2013

## 2013-07-22 NOTE — Patient Instructions (Signed)
It was good to see you today. Medications reviewed and updated, no new changes at this time. Test(s) ordered today. Your results will be released to MyChart (or called to you) after review, usually within 72hours after test completion. If any changes need to be made, you will be notified at that same time. Medications reviewed and updated, use Flexeril muscle relaxer at bedtime for cramps -no other changes recommended at this time. Your prescription(s) have been submitted to your pharmacy. Please take as directed and contact our office if you believe you are having problem(s) with the medication(s). Please schedule followup in 6 months for thyroid and diabetes mellitus check, call sooner if problems.

## 2013-07-22 NOTE — Assessment & Plan Note (Signed)
Lab Results  Component Value Date   TSH 3.36 03/06/2013   Check now and adjust dose as needed

## 2013-08-19 ENCOUNTER — Telehealth: Payer: Self-pay | Admitting: *Deleted

## 2013-08-19 MED ORDER — TRIAMCINOLONE ACETONIDE 0.025 % EX OINT: TOPICAL_OINTMENT | Freq: Two times a day (BID) | CUTANEOUS | Status: DC | PRN

## 2013-08-19 NOTE — Telephone Encounter (Signed)
Pt called requesting Triamcinolone Ointment .0125% for itching.  Ointment not on medication list.  Please advise

## 2013-08-19 NOTE — Telephone Encounter (Signed)
Ok erx done

## 2013-08-20 NOTE — Telephone Encounter (Signed)
Spoke with pt advised Rx sent to pharmacy. 

## 2013-08-23 ENCOUNTER — Other Ambulatory Visit: Payer: Self-pay

## 2013-08-23 DIAGNOSIS — Z1231 Encounter for screening mammogram for malignant neoplasm of breast: Secondary | ICD-10-CM

## 2013-09-04 ENCOUNTER — Ambulatory Visit: Payer: Medicare Other | Admitting: Internal Medicine

## 2013-09-10 ENCOUNTER — Other Ambulatory Visit: Payer: Self-pay | Admitting: *Deleted

## 2013-09-10 MED ORDER — AMLODIPINE BESYLATE 5 MG PO TABS: ORAL_TABLET | ORAL | Status: DC

## 2013-09-11 ENCOUNTER — Other Ambulatory Visit: Payer: Self-pay | Admitting: Internal Medicine

## 2013-09-17 ENCOUNTER — Ambulatory Visit
Admission: RE | Admit: 2013-09-17 | Discharge: 2013-09-17 | Disposition: A | Discharge status: Home / Self Care | Payer: Medicare Other | Source: Ambulatory Visit

## 2013-09-17 DIAGNOSIS — Z1231 Encounter for screening mammogram for malignant neoplasm of breast: Secondary | ICD-10-CM

## 2013-09-21 ENCOUNTER — Other Ambulatory Visit: Payer: Self-pay | Admitting: Internal Medicine

## 2013-09-25 ENCOUNTER — Other Ambulatory Visit: Payer: Self-pay | Admitting: Internal Medicine

## 2013-09-25 ENCOUNTER — Telehealth: Payer: Self-pay | Admitting: *Deleted

## 2013-09-25 MED ORDER — TRIAMCINOLONE ACETONIDE 0.1 % EX CREA: TOPICAL_CREAM | Freq: Two times a day (BID) | CUTANEOUS | Status: DC

## 2013-09-25 MED ORDER — LEVOTHYROXINE SODIUM 50 MCG PO TABS: 50.0000 ug | ORAL_TABLET | Freq: Every day | ORAL | Status: DC

## 2013-09-25 NOTE — Telephone Encounter (Signed)
done

## 2013-09-25 NOTE — Telephone Encounter (Signed)
Pt called requesting Triamcinolone Cream refill instead of ointment.  Please advise

## 2013-09-26 ENCOUNTER — Telehealth: Payer: Self-pay | Admitting: Internal Medicine

## 2013-09-26 NOTE — Telephone Encounter (Signed)
Pt is requesting her levothryxine( Synthroid ).  She is out today.  She wants it to go to [facility].  The RX response says the transmission failed.

## 2013-09-27 ENCOUNTER — Other Ambulatory Visit: Payer: Self-pay | Admitting: *Deleted

## 2013-09-27 MED ORDER — ATORVASTATIN CALCIUM 10 MG PO TABS: 5.0000 mg | ORAL_TABLET | Freq: Every day | ORAL | Status: DC

## 2013-09-27 MED ORDER — LEVOTHYROXINE SODIUM 50 MCG PO TABS: 50.0000 ug | ORAL_TABLET | Freq: Every day | ORAL | Status: DC

## 2013-09-27 MED ORDER — LOSARTAN POTASSIUM 100 MG PO TABS: ORAL_TABLET | ORAL | Status: DC

## 2013-09-27 NOTE — Telephone Encounter (Signed)
Left msg on vm stating she need her generic lipitor as well. Notified pt inform her will send to her pharmacy...Raechel Chute

## 2013-09-27 NOTE — Telephone Encounter (Signed)
Notified pt resent levothyroxine. Pt states she also need losartan as well...lmb

## 2013-11-28 ENCOUNTER — Ambulatory Visit (INDEPENDENT_AMBULATORY_CARE_PROVIDER_SITE_OTHER): Payer: Medicare Other

## 2013-11-28 DIAGNOSIS — Z23 Encounter for immunization: Secondary | ICD-10-CM

## 2013-12-25 ENCOUNTER — Ambulatory Visit (INDEPENDENT_AMBULATORY_CARE_PROVIDER_SITE_OTHER): Payer: Medicare Other | Admitting: Internal Medicine

## 2013-12-25 ENCOUNTER — Encounter: Payer: Self-pay | Admitting: Internal Medicine

## 2013-12-25 ENCOUNTER — Other Ambulatory Visit (INDEPENDENT_AMBULATORY_CARE_PROVIDER_SITE_OTHER): Payer: Medicare Other

## 2013-12-25 VITALS — BP 110/72 | HR 64 | Temp 97.3°F | Wt 171.8 lb

## 2013-12-25 DIAGNOSIS — E039 Hypothyroidism, unspecified: Secondary | ICD-10-CM

## 2013-12-25 DIAGNOSIS — R109 Unspecified abdominal pain: Secondary | ICD-10-CM

## 2013-12-25 DIAGNOSIS — E119 Type 2 diabetes mellitus without complications: Secondary | ICD-10-CM

## 2013-12-25 DIAGNOSIS — I10 Essential (primary) hypertension: Secondary | ICD-10-CM

## 2013-12-25 LAB — CBC WITH DIFFERENTIAL/PLATELET
Basophils Absolute: 0.1 10*3/uL (ref 0.0–0.1)
Basophils Relative: 0.7 % (ref 0.0–3.0)
Eosinophils Absolute: 0.5 10*3/uL (ref 0.0–0.7)
Eosinophils Relative: 6.1 % — ABNORMAL HIGH (ref 0.0–5.0)
HCT: 38.2 % (ref 36.0–46.0)
Hemoglobin: 13 g/dL (ref 12.0–15.0)
Lymphocytes Relative: 20.6 % (ref 12.0–46.0)
Lymphs Abs: 1.6 10*3/uL (ref 0.7–4.0)
MCHC: 34.1 g/dL (ref 30.0–36.0)
MCV: 90.1 fl (ref 78.0–100.0)
Monocytes Absolute: 0.6 10*3/uL (ref 0.1–1.0)
Monocytes Relative: 7.8 % (ref 3.0–12.0)
Neutro Abs: 5.2 10*3/uL (ref 1.4–7.7)
Neutrophils Relative %: 64.8 % (ref 43.0–77.0)
Platelets: 234 10*3/uL (ref 150.0–400.0)
RBC: 4.24 Mil/uL (ref 3.87–5.11)
RDW: 13.9 % (ref 11.5–14.6)
WBC: 8 10*3/uL (ref 4.5–10.5)

## 2013-12-25 LAB — BASIC METABOLIC PANEL
BUN: 10 mg/dL (ref 6–23)
CO2: 28 mEq/L (ref 19–32)
Calcium: 9.5 mg/dL (ref 8.4–10.5)
Chloride: 103 mEq/L (ref 96–112)
Creatinine, Ser: 1 mg/dL (ref 0.4–1.2)
GFR: 73.86 mL/min (ref >60.00)
Glucose, Bld: 90 mg/dL (ref 70–99)
Potassium: 4.3 mEq/L (ref 3.5–5.1)
Sodium: 138 mEq/L (ref 135–145)

## 2013-12-25 LAB — TSH: TSH: 3.69 u[IU]/mL (ref 0.35–5.50)

## 2013-12-25 LAB — HEPATIC FUNCTION PANEL
ALT: 15 U/L (ref 0–35)
AST: 17 U/L (ref 0–37)
Albumin: 4.2 g/dL (ref 3.5–5.2)
Alkaline Phosphatase: 80 U/L (ref 39–117)
Bilirubin, Direct: 0.1 mg/dL (ref 0.0–0.3)
Total Bilirubin: 0.6 mg/dL (ref 0.3–1.2)
Total Protein: 7.7 g/dL (ref 6.0–8.3)

## 2013-12-25 LAB — HEMOGLOBIN A1C: Hgb A1c MFr Bld: 5.8 % (ref 4.6–6.5)

## 2013-12-25 MED ORDER — MELOXICAM 15 MG PO TABS: ORAL_TABLET | ORAL | Status: DC

## 2013-12-25 MED ORDER — OMEPRAZOLE 20 MG PO CPDR: 20.0000 mg | DELAYED_RELEASE_CAPSULE | Freq: Every day | ORAL | Status: DC

## 2013-12-25 NOTE — Assessment & Plan Note (Signed)
BP Readings from Last 3 Encounters:  12/25/13 110/72  07/22/13 132/82  03/06/13 120/72   on amlodipine Resumed ARB 08/2012 for improved BP control The current medical regimen is effective;  continue present plan and medications.

## 2013-12-25 NOTE — Patient Instructions (Addendum)
It was good to see you today.  Medications reviewed and updated Stop meloxicam at this time and begin generic Prilosec once daily for stomach pain -  Your prescription(s) have been submitted to your pharmacy. Please take as directed and contact our office if you believe you are having problem(s) with the medication(s).  Test(s) ordered today. Your results will be released to MyChart (or called to you) after review, usually within 72hours after test completion. If any changes need to be made, you will be notified at that same time.  If continued problems with stomach pain despite stopping meloxicam and daily use of omeprazole x 30 days, please let us know and we will make referral to gastroenterology for further evaluation and treatment as needed  Please schedule followup in 6 months for thyroid and diabetes mellitus check, call sooner if problems.  Abdominal Pain, Adult Many things can cause belly (abdominal) pain. Most times, the belly pain is not dangerous. Many cases of belly pain can be watched and treated at home. HOME CARE   Do not take medicines that help you go poop (laxatives) unless told to by your doctor.  Only take medicine as told by your doctor.  Eat or drink as told by your doctor. Your doctor will tell you if you should be on a special diet. GET HELP IF:  You do not know what is causing your belly pain.  You have belly pain while you are sick to your stomach (nauseous) or have runny poop (diarrhea).  You have pain while you pee or poop.  Your belly pain wakes you up at night.  You have belly pain that gets worse or better when you eat.  You have belly pain that gets worse when you eat fatty foods. GET HELP RIGHT AWAY IF:   The pain does not go away within 2 hours.  You have a fever.  You keep throwing up (vomiting).  The pain changes and is only in the right or left part of the belly.  You have bloody or tarry looking poop. MAKE SURE YOU:   Understand  these instructions.  Will watch your condition.  Will get help right away if you are not doing well or get worse. Document Released: 05/16/2008 Document Revised: 09/18/2013 Document Reviewed: 08/07/2013 [facility] Patient Information 2014 [place], [place].

## 2013-12-25 NOTE — Assessment & Plan Note (Signed)
Lab Results  Component Value Date   TSH 3.09 07/22/2013   Check now and adjust dose as needed

## 2013-12-25 NOTE — Progress Notes (Signed)
  Subjective:    Patient ID: Gloria [name], female    DOB: [birth year], 63 y.o.   MRN: [id number]  GI Problem Primary symptoms do not include fatigue.   complains of stomach cramp after initiation of meloxicam 2 weeks No nausea and vomiting, bowel change, melena  Also reviewed chronic medical issues:  myasthenia gravis - dx age [age]y - follows with neuro at [facility] annually for same - no recent flares or exac -reports compliance with ongoing medical treatment and no changes in medication dose or frequency. denies adverse side effects related to current therapy.     DM2 - dx 02/2005- reports compliance with ongoing medical treatment and no changes in medication dose or frequency. denies adverse side effects related to current therapy.     HTN - reports compliance with ongoing medical treatment and no changes in medication dose or frequency. denies adverse side effects related to current therapy.     dyslipidemia - reports compliance with ongoing medical treatment and no changes in medication dose or frequency. denies adverse side effects related to current therapy.     hypothyroid - reports compliance with ongoing medical treatment - reviewed medication dose adjustments. denies adverse side effects related to current therapy.     Past Medical History  Diagnosis Date  . Myasthenia gravis   . Diabetes mellitus, type 2   . Hypertension   . Dyslipidemia   . Hypothyroid   . Alopecia areata 10/2010 onset    Review of Systems  Constitutional: Negative for fatigue and unexpected weight change.  Respiratory: Negative for cough and shortness of breath.   Cardiovascular: Negative for chest pain and palpitations.        Objective:   Physical Exam BP 110/72  Pulse 64  Temp(Src) 97.3 F (36.3 C) (Oral)  Wt 171 lb 12.8 oz (77.928 kg)  SpO2 99% Wt Readings from Last 3 Encounters:  12/25/13 171 lb 12.8 oz (77.928 kg)  07/22/13 171 lb (77.565 kg)  03/06/13 170 lb (77.111 kg)    Constitutional: She appears well-developed and well-nourished. No distress.  Neck: Normal range of motion. Neck supple. No JVD present. No thyromegaly present.  Cardiovascular: Normal rate, regular rhythm and normal heart sounds.  No murmur heard. No BLE edema. Pulmonary/Chest: Effort normal and breath sounds normal. No respiratory distress. She has no wheezes. Abdomen: SNTND, +BS Rectal - defer Psychiatric: She has a normal mood and affect. Her behavior is normal. Judgment and thought content normal.   Lab Results  Component Value Date   WBC 6.7 07/22/2013   HGB 12.5 07/22/2013   HCT 37.8 07/22/2013   PLT 218.0 07/22/2013   GLUCOSE 96 07/22/2013   CHOL 170 07/22/2013   TRIG 139.0 07/22/2013   HDL 49.20 07/22/2013   LDLCALC 93 07/22/2013   ALT 16 09/05/2012   AST 24 09/05/2012   NA 140 07/22/2013   K 4.2 07/22/2013   CL 105 07/22/2013   CREATININE 1.0 07/22/2013   BUN 8 07/22/2013   CO2 29 07/22/2013   TSH 3.09 07/22/2013   HGBA1C 5.7 07/22/2013       Assessment & Plan:   Abdominal cramping - NSAID induced per pt x 2 weeks-  ?gastritis vs other -  check labs Stop NSAIDs start PPI Refer to GI If unimproved with conservative medical care or if abnormalities on labs such as anemia  Also see problem list. Medications and labs reviewed today.

## 2013-12-25 NOTE — Assessment & Plan Note (Signed)
Previously on glipize in addition to metformin - but monotherapy only since 12/2010 Reduced dose metformin 2013 to qd Check a1c now and every 6 mo Follows with optho annually ([facility]) - no DM changes noted Lab Results  Component Value Date   HGBA1C 5.7 07/22/2013

## 2013-12-25 NOTE — Progress Notes (Signed)
Pre-visit discussion using our clinic review tool. No additional management support is needed unless otherwise documented below in the visit note.  

## 2013-12-25 NOTE — Progress Notes (Signed)
Subjective:    Patient ID: Gloria [name], female    DOB: 02/03/1951, 63 y.o.   MRN: [id number]  GI Problem The primary symptoms include abdominal pain. Primary symptoms do not include fever, weight loss, nausea, vomiting, diarrhea, melena, hematemesis, jaundice or dysuria. The illness began more than 7 days ago. The problem has not changed since onset. The illness is also significant for bloating. The illness does not include chills, dysphagia, odynophagia, constipation or back pain. Risk factors: Regular NSAID use.   Past Medical History  Diagnosis Date  . Myasthenia gravis   . Diabetes mellitus, type 2   . Hypertension   . Dyslipidemia   . Hypothyroid   . Alopecia areata 10/2010 onset   Past Surgical History  Procedure Laterality Date  . Abdominal hysterectomy  1990   History   Social History  . Marital Status: Divorced    Spouse Name: N/A    Number of Children: N/A  . Years of Education: N/A   Occupational History  . Not on file.   Social History Main Topics  . Smoking status: Never Smoker   . Smokeless tobacco: Not on file  . Alcohol Use: No  . Drug Use:   . Sexual Activity:    Other Topics Concern  . Not on file   Social History Narrative  . No narrative on file   Family History  Problem Relation Age of Onset  . Adopted: Yes   Current Outpatient Prescriptions on File Prior to Visit  Medication Sig Dispense Refill  . amLODipine (NORVASC) 5 MG tablet TAKE 1 TABLET BY MOUTH EVERY DAY  30 tablet  5  . atorvastatin (LIPITOR) 10 MG tablet Take 0.5 tablets (5 mg total) by mouth daily.  30 tablet  11  . clobetasol (OLUX) 0.05 % topical foam Apply topically 2 (two) times daily as needed.      . cyclobenzaprine (FLEXERIL) 5 MG tablet Take 1 tablet (5 mg total) by mouth every 8 (eight) hours as needed for muscle spasms.  30 tablet  1  . ferrous sulfate 325 (65 FE) MG tablet Take 325 mg by mouth daily.        Marland Kitchen. levothyroxine (SYNTHROID, LEVOTHROID) 50 MCG tablet  Take 1 tablet (50 mcg total) by mouth daily.  30 tablet  11  . losartan (COZAAR) 100 MG tablet TAKE 1 TABLET BY MOUTH EVERY DAY  30 tablet  11  . metFORMIN (GLUCOPHAGE-XR) 500 MG 24 hr tablet TAKE 1 TABLET BY MOUTH DAILY WITH BREAKFAST  180 tablet  1  . pyridostigmine (MESTINON) 60 MG tablet Take 60 mg by mouth 3 (three) times daily. Per Neurologist       . triamcinolone cream (KENALOG) 0.1 % Apply topically 2 (two) times daily.  30 g  0   No current facility-administered medications on file prior to visit.      Review of Systems  Constitutional: Negative for fever, chills and weight loss.  Gastrointestinal: Positive for abdominal pain, abdominal distention and bloating. Negative for dysphagia, nausea, vomiting, diarrhea, constipation, blood in stool, melena, anal bleeding, rectal pain, hematemesis and jaundice.  Genitourinary: Negative for dysuria, urgency, hematuria, decreased urine volume, vaginal discharge and pelvic pain.  Musculoskeletal: Negative for back pain and gait problem.       Objective:   Physical Exam  Nursing note and vitals reviewed. Constitutional: Vital signs are normal. She appears well-developed and well-nourished. She is cooperative. She does not appear ill. No distress.  HENT:  Head:  Normocephalic and atraumatic.  Eyes: Conjunctivae and EOM are normal. Pupils are equal, round, and reactive to light.  Neck: Normal range of motion. No JVD present.  Cardiovascular: Normal rate, regular rhythm and normal heart sounds.  Exam reveals no gallop and no friction rub.   No murmur heard. Pulmonary/Chest: Effort normal and breath sounds normal. No respiratory distress. She has no wheezes. She exhibits no tenderness.  Abdominal: Soft. Normal appearance and bowel sounds are normal. She exhibits no mass. There is no hepatosplenomegaly. There is no tenderness. There is no rebound, no guarding and no CVA tenderness.  Neurological: She is alert.     Filed Vitals:   12/25/13  1355  BP: 110/72  Pulse: 64  Temp: 97.3 F (36.3 C)  TempSrc: Oral  Weight: 171 lb 12.8 oz (77.928 kg)  SpO2: 99%   Wt Readings from Last 3 Encounters:  12/25/13 171 lb 12.8 oz (77.928 kg)  07/22/13 171 lb (77.565 kg)  03/06/13 170 lb (77.111 kg)   Lab Results  Component Value Date   WBC 6.7 07/22/2013   HGB 12.5 07/22/2013   HCT 37.8 07/22/2013   PLT 218.0 07/22/2013   GLUCOSE 96 07/22/2013   CHOL 170 07/22/2013   TRIG 139.0 07/22/2013   HDL 49.20 07/22/2013   LDLCALC 93 07/22/2013   ALT 16 09/05/2012   AST 24 09/05/2012   NA 140 07/22/2013   K 4.2 07/22/2013   CL 105 07/22/2013   CREATININE 1.0 07/22/2013   BUN 8 07/22/2013   CO2 29 07/22/2013   TSH 3.09 07/22/2013   HGBA1C 5.7 07/22/2013          Assessment & Plan:  gastroesophageal reflux disease - labs ordered today to determine acid vs ulcers or other cause-- CBC, BMET, A1c, TSH, and Hepatic function panel to ensure no other cause of gastric pain/bleeding.  Patient will be notified of results.    Patient was perscribed a 30x day supply of omeprazole 20 mg daily.  Patient was informed to hold the Asprin for a few days and if symptoms are resolved she can continue the Asprin treatment.  Patient was instructed to stop taking the meloxicam.  Patient was educated on the disease state and treatment.   If symptoms do not resolve consider referring to GI specialist for further evaluation.  Diabetes - controlled,  Past labs who effectiveness Updates labs ordered today.  Hypothyroidism -  Past labs show effectiveness Updates labs ordered today.  Hyperlipidemia - past labs show effectiveness   Updates lab set ordered today.   Patient was informed to notify the office if any new symptoms arise or current symptoms do not improve with treatment.  If labs are normal patient will  Return in about 6 months (around 06/24/2014).  Jonna Coup   I have personally reviewed this case with PA student. I also personally examined this  patient. I agree with history and findings as documented above. I reviewed, discussed and approve of the assessment and plan as listed above. Rene Paci, MD

## 2014-01-22 ENCOUNTER — Ambulatory Visit: Payer: Medicare Other | Admitting: Internal Medicine

## 2014-02-21 ENCOUNTER — Other Ambulatory Visit: Payer: Self-pay | Admitting: Internal Medicine

## 2014-03-07 ENCOUNTER — Other Ambulatory Visit: Payer: Self-pay | Admitting: Internal Medicine

## 2014-05-20 ENCOUNTER — Telehealth: Payer: Self-pay | Admitting: *Deleted

## 2014-05-20 NOTE — Telephone Encounter (Signed)
A user error has taken place.

## 2014-06-10 LAB — HM DIABETES EYE EXAM

## 2014-06-12 ENCOUNTER — Encounter: Payer: Self-pay | Admitting: *Deleted

## 2014-06-18 ENCOUNTER — Ambulatory Visit: Payer: Medicare Other | Admitting: Internal Medicine

## 2014-06-19 ENCOUNTER — Telehealth: Payer: Self-pay | Admitting: Internal Medicine

## 2014-06-19 ENCOUNTER — Ambulatory Visit (INDEPENDENT_AMBULATORY_CARE_PROVIDER_SITE_OTHER): Payer: Medicare Other | Admitting: Internal Medicine

## 2014-06-19 ENCOUNTER — Encounter: Payer: Self-pay | Admitting: Internal Medicine

## 2014-06-19 ENCOUNTER — Other Ambulatory Visit (INDEPENDENT_AMBULATORY_CARE_PROVIDER_SITE_OTHER): Payer: Medicare Other

## 2014-06-19 VITALS — BP 130/82 | HR 46 | Temp 97.9°F | Ht 66.0 in | Wt 167.2 lb

## 2014-06-19 DIAGNOSIS — E039 Hypothyroidism, unspecified: Secondary | ICD-10-CM

## 2014-06-19 DIAGNOSIS — E785 Hyperlipidemia, unspecified: Secondary | ICD-10-CM

## 2014-06-19 DIAGNOSIS — E119 Type 2 diabetes mellitus without complications: Secondary | ICD-10-CM

## 2014-06-19 DIAGNOSIS — J069 Acute upper respiratory infection, unspecified: Secondary | ICD-10-CM

## 2014-06-19 DIAGNOSIS — B9789 Other viral agents as the cause of diseases classified elsewhere: Secondary | ICD-10-CM

## 2014-06-19 LAB — LIPID PANEL
Cholesterol: 153 mg/dL (ref 0–200)
HDL: 44.2 mg/dL (ref >39.00)
LDL Cholesterol: 78 mg/dL (ref 0–99)
NonHDL: 108.8
Total CHOL/HDL Ratio: 3
Triglycerides: 153 mg/dL — ABNORMAL HIGH (ref 0.0–149.0)
VLDL: 30.6 mg/dL (ref 0.0–40.0)

## 2014-06-19 LAB — HEMOGLOBIN A1C: Hgb A1c MFr Bld: 5.6 % (ref 4.6–6.5)

## 2014-06-19 LAB — TSH: TSH: 7.95 u[IU]/mL — ABNORMAL HIGH (ref 0.35–4.50)

## 2014-06-19 MED ORDER — PHENYLEPH-PROMETHAZINE-COD 5-6.25-10 MG/5ML PO SYRP: 5.0000 mL | ORAL_SOLUTION | ORAL | Status: DC | PRN

## 2014-06-19 NOTE — Progress Notes (Signed)
Subjective:    Patient ID: Gloria SearingCynthia L Ormiston, female    DOB: 1951-02-10, 63 y.o.   MRN: [id number]  HPI  Patient is here for follow up  Reviewed chronic medical issues and interval medical events  Past Medical History  Diagnosis Date  . Myasthenia gravis   . Diabetes mellitus, type 2   . Hypertension   . Dyslipidemia   . Hypothyroid   . Alopecia areata 10/2010 onset    Review of Systems  Constitutional: Positive for diaphoresis (x 72h) and fatigue. Negative for fever and unexpected weight change.  HENT: Positive for congestion, postnasal drip, rhinorrhea and sinus pressure. Negative for ear discharge and ear pain.        Mild head cold x 72h, improved with OTC coricidian med  Respiratory: Negative for shortness of breath and wheezing.   Cardiovascular: Negative for chest pain and leg swelling.       Objective:   Physical Exam  BP 130/82  Pulse 46  Temp(Src) 97.9 F (36.6 C) (Oral)  Ht 5\' 6"  (1.676 m)  Wt 167 lb 4 oz (75.864 kg)  BMI 27.01 kg/m2  SpO2 99% Wt Readings from Last 3 Encounters:  06/19/14 167 lb 4 oz (75.864 kg)  12/25/13 171 lb 12.8 oz (77.928 kg)  07/22/13 171 lb (77.565 kg)   Constitutional: She appears well-developed and well-nourished. No distress.  HENT: NCAT, nontender sinus, OP with mild erythema and clear PND, TMs clear without effusion or erythema Neck: Normal range of motion. Neck supple. No JVD present. No thyromegaly present.  Cardiovascular: Normal rate, regular rhythm and normal heart sounds.  No murmur heard. No BLE edema. Pulmonary/Chest: Effort normal and breath sounds normal. No respiratory distress. She has no wheezes.  Psychiatric: She has a normal mood and affect. Her behavior is normal. Judgment and thought content normal.   Lab Results  Component Value Date   WBC 8.0 12/25/2013   HGB 13.0 12/25/2013   HCT 38.2 12/25/2013   PLT 234.0 12/25/2013   GLUCOSE 90 12/25/2013   CHOL 170 07/22/2013   TRIG 139.0 07/22/2013   HDL 49.20  07/22/2013   LDLCALC 93 07/22/2013   ALT 15 12/25/2013   AST 17 12/25/2013   NA 138 12/25/2013   K 4.3 12/25/2013   CL 103 12/25/2013   CREATININE 1.0 12/25/2013   BUN 10 12/25/2013   CO2 28 12/25/2013   TSH 3.69 12/25/2013   HGBA1C 5.8 12/25/2013    Mm Digital Screening  09/18/2013   CLINICAL DATA:  Screening.  EXAM: DIGITAL SCREENING BILATERAL MAMMOGRAM WITH CAD  COMPARISON:  Previous exam(s).  ACR Breast Density Category b: There are scattered areas of fibroglandular density.  FINDINGS: There are no findings suspicious for malignancy. Images were processed with CAD.  IMPRESSION: No mammographic evidence of malignancy. A result letter of this screening mammogram will be mailed directly to the patient.  RECOMMENDATION: Screening mammogram in one year. (Code:SM-B-01Y)  BI-RADS CATEGORY  1: Negative   Electronically Signed   By: Edwin CapJennifer  Jarosz M.D.   On: 09/18/2013 13:21       Assessment & Plan:   URI - summer viral infection - reassurance provided, no indication for antibiotics in viral disease. Symptomatically care with promethazine DM + cod - rx given to pt - will call if worse or unimproved  Problem List Items Addressed This Visit   DIABETES MELLITUS, TYPE II - Primary      Previously on glipize in addition to metformin - but monotherapy  only since 12/2010 Reduced dose metformin 2013 to qd Check a1c now and every 6 mo - ok to do "diet only" if a1c<5.5 Follows with optho annually (yokum) - no DM changes noted Lab Results  Component Value Date   HGBA1C 5.8 12/25/2013      Relevant Orders      Lipid panel      Hemoglobin A1c   HYPERLIPIDEMIA     On low dose atorva - check lipids annually Last lipids reviewed and at goal The current medical regimen is effective;  continue present plan and medications.    HYPOTHYROIDISM      Lab Results  Component Value Date   TSH 3.69 12/25/2013   Check now and adjust dose as needed    Relevant Orders      TSH

## 2014-06-19 NOTE — Telephone Encounter (Signed)
Her insurance will not cover the current rx for cough.  Please advise

## 2014-06-19 NOTE — Assessment & Plan Note (Signed)
Lab Results  Component Value Date   TSH 3.69 12/25/2013   Check now and adjust dose as needed

## 2014-06-19 NOTE — Assessment & Plan Note (Signed)
On low dose atorva - check lipids annually Last lipids reviewed and at goal The current medical regimen is effective;  continue present plan and medications. 

## 2014-06-19 NOTE — Progress Notes (Signed)
Pre visit review using our clinic review tool, if applicable. No additional management support is needed unless otherwise documented below in the visit note. 

## 2014-06-19 NOTE — Assessment & Plan Note (Signed)
Previously on glipize in addition to metformin - but monotherapy only since 12/2010 Reduced dose metformin 2013 to qd Check a1c now and every 6 mo - ok to do "diet only" if a1c<5.5 Follows with optho annually (yokum) - no DM changes noted Lab Results  Component Value Date   HGBA1C 5.8 12/25/2013

## 2014-06-19 NOTE — Telephone Encounter (Signed)
Patient states her insurance will not pay for the Phenyleph-Promethazine-Cod 5-6.25-10 MG/5ML SYRP we sent to the pharmacy for her. She is requesting something else be sent to [facility] on [address].

## 2014-06-19 NOTE — Patient Instructions (Signed)
It was good to see you today.  We have reviewed your prior records including labs and tests today  Test(s) ordered today. Your results will be released to MyChart (or called to you) after review, usually within 72hours after test completion. If any changes need to be made, you will be notified at that same time.  prescription cough syrup given to you - If you develop worsening symptoms or fever, call and we can reconsider antibiotics, but it does not appear necessary to use antibiotics at this time.  Hydrate, rest and call if worse or unimproved  Other medications reviewed and updated, no other changes recommended at this time. Refill on medication(s) as discussed today.  Please schedule followup in 6 months, call sooner if problems.

## 2014-06-20 ENCOUNTER — Other Ambulatory Visit: Payer: Self-pay | Admitting: Internal Medicine

## 2014-06-20 MED ORDER — LEVOTHYROXINE SODIUM 75 MCG PO TABS: ORAL_TABLET | ORAL | Status: DC

## 2014-06-20 MED ORDER — BENZONATATE 100 MG PO CAPS: 100.0000 mg | ORAL_CAPSULE | Freq: Two times a day (BID) | ORAL | Status: DC | PRN

## 2014-06-20 MED ORDER — LEVOTHYROXINE SODIUM 75 MCG PO TABS: 75.0000 ug | ORAL_TABLET | Freq: Every day | ORAL | Status: DC

## 2014-06-20 NOTE — Addendum Note (Signed)
Addended by: Rene PaciLESCHBER, Dewaun Kinzler A on: 06/20/2014 07:15 AM   Modules accepted: Orders

## 2014-06-20 NOTE — Addendum Note (Signed)
Addended by: Deatra JamesBRAND, LUCY M on: 06/20/2014 03:57 PM   Modules accepted: Orders

## 2014-06-20 NOTE — Telephone Encounter (Signed)
[facility] is asking for this levothyroxine (SYNTHROID, LEVOTHROID) to be re-sent because they had a glitch in their system.

## 2014-06-20 NOTE — Telephone Encounter (Signed)
Sent erx for tessalon bid prn thanks

## 2014-08-19 ENCOUNTER — Other Ambulatory Visit: Payer: Self-pay | Admitting: Internal Medicine

## 2014-08-19 ENCOUNTER — Other Ambulatory Visit: Payer: Self-pay

## 2014-08-19 DIAGNOSIS — Z1231 Encounter for screening mammogram for malignant neoplasm of breast: Secondary | ICD-10-CM

## 2014-09-09 ENCOUNTER — Other Ambulatory Visit: Payer: Self-pay | Admitting: Internal Medicine

## 2014-09-16 ENCOUNTER — Other Ambulatory Visit: Payer: Self-pay | Admitting: Internal Medicine

## 2014-09-18 ENCOUNTER — Ambulatory Visit
Admission: RE | Admit: 2014-09-18 | Discharge: 2014-09-18 | Disposition: A | Discharge status: Home / Self Care | Payer: Medicare Other | Source: Ambulatory Visit

## 2014-09-18 DIAGNOSIS — Z1231 Encounter for screening mammogram for malignant neoplasm of breast: Secondary | ICD-10-CM

## 2014-09-30 ENCOUNTER — Other Ambulatory Visit: Payer: Self-pay | Admitting: Internal Medicine

## 2014-10-01 ENCOUNTER — Other Ambulatory Visit: Payer: Self-pay | Admitting: Internal Medicine

## 2014-10-17 ENCOUNTER — Ambulatory Visit (INDEPENDENT_AMBULATORY_CARE_PROVIDER_SITE_OTHER): Payer: Medicare Other | Admitting: *Deleted

## 2014-10-17 ENCOUNTER — Ambulatory Visit: Payer: Medicare Other

## 2014-10-17 DIAGNOSIS — Z23 Encounter for immunization: Secondary | ICD-10-CM

## 2014-11-17 ENCOUNTER — Other Ambulatory Visit: Payer: Self-pay | Admitting: Internal Medicine

## 2014-12-19 ENCOUNTER — Ambulatory Visit (INDEPENDENT_AMBULATORY_CARE_PROVIDER_SITE_OTHER): Payer: Medicare Other | Admitting: Family

## 2014-12-19 ENCOUNTER — Encounter: Payer: Self-pay | Admitting: Family

## 2014-12-19 ENCOUNTER — Telehealth: Payer: Self-pay | Admitting: Internal Medicine

## 2014-12-19 VITALS — BP 120/74 | HR 48 | Temp 98.1°F | Resp 18 | Ht 66.0 in | Wt 171.0 lb

## 2014-12-19 DIAGNOSIS — R05 Cough: Secondary | ICD-10-CM

## 2014-12-19 DIAGNOSIS — R059 Cough, unspecified: Secondary | ICD-10-CM | POA: Insufficient documentation

## 2014-12-19 MED ORDER — HYDROCODONE-HOMATROPINE 5-1.5 MG/5ML PO SYRP: 5.0000 mL | ORAL_SOLUTION | Freq: Three times a day (TID) | ORAL | Status: DC | PRN

## 2014-12-19 MED ORDER — AMOXICILLIN-POT CLAVULANATE 875-125 MG PO TABS: 1.0000 | ORAL_TABLET | Freq: Two times a day (BID) | ORAL | Status: DC

## 2014-12-19 NOTE — Assessment & Plan Note (Signed)
Symptoms and exam consistent with bacterial sinusitis. Start Augmentin 10 days. Discussed over-the-counter medication use with patient and provided lists of medications for relief. Follow up if symptoms worsen or fail to improve.

## 2014-12-19 NOTE — Telephone Encounter (Signed)
Contacted pt and appt set with Tammy SoursGreg.

## 2014-12-19 NOTE — Progress Notes (Signed)
Pre visit review using our clinic review tool, if applicable. No additional management support is needed unless otherwise documented below in the visit note. 

## 2014-12-19 NOTE — Patient Instructions (Addendum)
Thank you for choosing [facility].  Summary/Instructions:  Your prescription(s) have been submitted to your pharmacy or been printed and provided for you. Please take as directed and contact our office if you believe you are having problem(s) with the medication(s) or have any questions.  If your symptoms worsen or fail to improve, please contact our office for further instruction, or in case of emergency go directly to the emergency room at the closest medical facility.   General Recommendations:    Please drink plenty of fluids.  Get plenty of rest   Sleep in humidified air  Use saline nasal sprays  Netti pot   OTC Medications:  Decongestants - helps relieve congestion   Flonase (generic fluticasone) or Nasacort (generic triamcinolone) - please make sure to use the "cross-over" technique at a 45 degree angle towards the opposite eye as opposed to straight up the nasal passageway.   Sudafed (generic pseudoephedrine - Note this is the one that is available behind the pharmacy counter); Products with phenylephrine (-PE) may also be used but is often not as effective as pseudoephedrine.   If you have HIGH BLOOD PRESSURE - Coricidin HBP; AVOID any product that is -D as this contains pseudoephedrine which may increase your blood pressure.  Afrin (oxymetazoline) every 6-8 hours for up to 3 days.   Allergies - helps relieve runny nose, itchy eyes and sneezing   Claritin (generic loratidine), Allegra (fexofenidine), or Zyrtec (generic cyrterizine) for runny nose. These medications should not cause drowsiness.  Note - Benadryl (generic diphenhydramine) may be used however may cause drowsiness  Cough -   Delsym or Robitussin (generic dextromethorphan)  Expectorants - helps loosen mucus to ease removal   Mucinex (generic guaifenesin) as directed on the package.  Headaches / General Aches   Tylenol (generic acetaminophen) - DO NOT EXCEED 3 grams (3,000 mg) in a 24  hour time period  Advil/Motrin (generic ibuprofen)   Sore Throat -   Salt water gargle   Chloraseptic (generic benzocaine) spray or lozenges / Sucrets (generic dyclonine)    Sinusitis Sinusitis is redness, soreness, and inflammation of the paranasal sinuses. Paranasal sinuses are air pockets within the bones of your face (beneath the eyes, the middle of the forehead, or above the eyes). In healthy paranasal sinuses, mucus is able to drain out, and air is able to circulate through them by way of your nose. However, when your paranasal sinuses are inflamed, mucus and air can become trapped. This can allow bacteria and other germs to grow and cause infection. Sinusitis can develop quickly and last only a short time (acute) or continue over a long period (chronic). Sinusitis that lasts for more than 12 weeks is considered chronic.  CAUSES  Causes of sinusitis include:  Allergies.  Structural abnormalities, such as displacement of the cartilage that separates your nostrils (deviated septum), which can decrease the air flow through your nose and sinuses and affect sinus drainage.  Functional abnormalities, such as when the small hairs (cilia) that line your sinuses and help remove mucus do not work properly or are not present. SIGNS AND SYMPTOMS  Symptoms of acute and chronic sinusitis are the same. The primary symptoms are pain and pressure around the affected sinuses. Other symptoms include:  Upper toothache.  Earache.  Headache.  Bad breath.  Decreased sense of smell and taste.  A cough, which worsens when you are lying flat.  Fatigue.  Fever.  Thick drainage from your nose, which often is green and may   contain pus (purulent).  Swelling and warmth over the affected sinuses. DIAGNOSIS  Your health care provider will perform a physical exam. During the exam, your health care provider may:  Look in your nose for signs of abnormal growths in your nostrils (nasal  polyps).  Tap over the affected sinus to check for signs of infection.  View the inside of your sinuses (endoscopy) using an imaging device that has a light attached (endoscope). If your health care provider suspects that you have chronic sinusitis, one or more of the following tests may be recommended:  Allergy tests.  Nasal culture. A sample of mucus is taken from your nose, sent to a lab, and screened for bacteria.  Nasal cytology. A sample of mucus is taken from your nose and examined by your health care provider to determine if your sinusitis is related to an allergy. TREATMENT  Most cases of acute sinusitis are related to a viral infection and will resolve on their own within 10 days. Sometimes medicines are prescribed to help relieve symptoms (pain medicine, decongestants, nasal steroid sprays, or saline sprays).  However, for sinusitis related to a bacterial infection, your health care provider will prescribe antibiotic medicines. These are medicines that will help kill the bacteria causing the infection.  Rarely, sinusitis is caused by a fungal infection. In theses cases, your health care provider will prescribe antifungal medicine. For some cases of chronic sinusitis, surgery is needed. Generally, these are cases in which sinusitis recurs more than 3 times per year, despite other treatments. HOME CARE INSTRUCTIONS   Drink plenty of water. Water helps thin the mucus so your sinuses can drain more easily.  Use a humidifier.  Inhale steam 3 to 4 times a day (for example, sit in the bathroom with the shower running).  Apply a warm, moist washcloth to your face 3 to 4 times a day, or as directed by your health care provider.  Use saline nasal sprays to help moisten and clean your sinuses.  Take medicines only as directed by your health care provider.  If you were prescribed either an antibiotic or antifungal medicine, finish it all even if you start to feel better. SEEK IMMEDIATE  MEDICAL CARE IF:  You have increasing pain or severe headaches.  You have nausea, vomiting, or drowsiness.  You have swelling around your face.  You have vision problems.  You have a stiff neck.  You have difficulty breathing. MAKE SURE YOU:   Understand these instructions.  Will watch your condition.  Will get help right away if you are not doing well or get worse. Document Released: 11/28/2005 Document Revised: 04/14/2014 Document Reviewed: 12/13/2011 Exit[employer] Patient Information 2015 [employer]. This information is not intended to replace advice given to you by your health care provider. Make sure you discuss any questions you have with your health care provider.   

## 2014-12-19 NOTE — Progress Notes (Signed)
   Subjective:    Patient ID: Gloria [name], female    DOB: [birth year], 64 y.o.   MRN: [id number]  Chief Complaint  Patient presents with  . Cough    coughing for over 1 month, says when coughing up phlem its blood as well as when she blows her nose, says her ears feel full    HPI:  Gloria [name] is a 64 y.o. female who presents today for an acute visit.   Acute symptoms of ear fullness and coughing has started just over a month ago. In the mornings notes some purulent sputum with some blood. Also notes when she blows her nose that there has been some blood in the drainage. Has tried Coriciden which has helped some but overall feels as though she is worsening.  No Known Allergies   Current Outpatient Prescriptions on File Prior to Visit  Medication Sig Dispense Refill  . amLODipine (NORVASC) 5 MG tablet TAKE 1 TABLET BY MOUTH EVERY DAY 90 tablet 2  . clobetasol (OLUX) 0.05 % topical foam Apply topically 2 (two) times daily as needed.    Marland Kitchen. levothyroxine (SYNTHROID, LEVOTHROID) 75 MCG tablet TAKE 1 TABLET BY MOUTH EVERY DAY 90 tablet 3  . losartan (COZAAR) 100 MG tablet TAKE 1 TABLET BY MOUTH EVERY DAY 30 tablet 11  . metFORMIN (GLUCOPHAGE-XR) 500 MG 24 hr tablet TAKE 1 TABLET BY MOUTH DAILY WITH BREAKFAST 90 tablet 3  . pyridostigmine (MESTINON) 60 MG tablet Take 60 mg by mouth 3 (three) times daily. Per Neurologist     . [facility] JOSEPH ASPIRIN PO Take 81 mg by mouth daily.    Marland Kitchen. triamcinolone cream (KENALOG) 0.1 % APPLY TO THE AFFECTED AREA TWICE DAILY 30 g 0   No current facility-administered medications on file prior to visit.    Review of Systems  Constitutional: Negative for fever and chills.  HENT: Positive for congestion, ear pain and sinus pressure.   Respiratory: Positive for cough.   Neurological: Positive for headaches.      Objective:    BP 120/74 mmHg  Pulse 48  Temp(Src) 98.1 F (36.7 C) (Oral)  Resp 18  Ht 5\' 6"  (1.676 m)  Wt 171 lb (77.565 kg)  BMI 27.61  kg/m2  SpO2 97% Nursing note and vital signs reviewed.  Physical Exam  Constitutional: She is oriented to person, place, and time. She appears well-developed and well-nourished. No distress.  HENT:  Right Ear: Hearing, tympanic membrane, external ear and ear canal normal.  Left Ear: Hearing, tympanic membrane, external ear and ear canal normal.  Nose: Nose normal. Right sinus exhibits no maxillary sinus tenderness and no frontal sinus tenderness. Left sinus exhibits no maxillary sinus tenderness and no frontal sinus tenderness.  Mouth/Throat: Uvula is midline, oropharynx is clear and moist and mucous membranes are normal.  Cardiovascular: Normal rate, regular rhythm, normal heart sounds and intact distal pulses.   Pulmonary/Chest: Effort normal and breath sounds normal.  Neurological: She is alert and oriented to person, place, and time.  Skin: Skin is warm and dry.  Psychiatric: She has a normal mood and affect. Her behavior is normal. Judgment and thought content normal.       Assessment & Plan:

## 2014-12-19 NOTE — Telephone Encounter (Signed)
Pt has been having a little blood when she blows her nose in the am, not in the evening for about 1 month. No headache, no fever. Pt does take OTC, Coricidin.   [phone]

## 2014-12-20 ENCOUNTER — Other Ambulatory Visit: Payer: Self-pay | Admitting: Family

## 2014-12-20 MED ORDER — FLUCONAZOLE 150 MG PO TABS: 150.0000 mg | ORAL_TABLET | Freq: Once | ORAL | Status: DC

## 2014-12-30 ENCOUNTER — Ambulatory Visit: Payer: Medicare Other | Admitting: Internal Medicine

## 2015-01-09 ENCOUNTER — Telehealth: Payer: Self-pay | Admitting: Family

## 2015-01-09 ENCOUNTER — Other Ambulatory Visit: Payer: Self-pay

## 2015-01-09 MED ORDER — AMOXICILLIN-POT CLAVULANATE 875-125 MG PO TABS: 1.0000 | ORAL_TABLET | Freq: Two times a day (BID) | ORAL | Status: DC

## 2015-01-09 NOTE — Telephone Encounter (Signed)
Ok to refill 

## 2015-01-09 NOTE — Telephone Encounter (Signed)
Patient requesting refill of  amoxicillin-clavulanate (AUGMENTIN) 875-125 MG per tablet 20 tablet 0 12/19/2014     Take 1 tablet by mouth 2 (two) times daily. - Oral   . States that her symptoms have manifested once again.

## 2015-01-12 ENCOUNTER — Telehealth: Payer: Self-pay | Admitting: Internal Medicine

## 2015-01-12 ENCOUNTER — Telehealth: Payer: Self-pay | Admitting: *Deleted

## 2015-01-12 NOTE — Telephone Encounter (Signed)
Medication that she is on is causing her hair to fall out.  amLODipine (NORVASC) 5 MG tablet [[id number]][[id number]]     Order Details   She is requesting to possibly get off of this medication. Please advise patient.

## 2015-01-12 NOTE — Telephone Encounter (Signed)
[facility] - Client TELEPHONE ADVICE RECORD [facility] Patient Name: Gloria [name] Gender: Female DOB: 02-05-1951 Age: [age] Y 5 M 15 D Return Phone Number: [phone] (Primary) Address: [place]: [place] [facility] Client Site [facility] Night Physician Rene PaciLeschber, Valerie Contact Type Call Caller Name Gloria [name] Caller Phone Number [phone] Relationship To Patient Self Is this call to report lab results? No Call Type General Information Initial Comment Caller states that needs to talk to her doctor about her medication. General Information Type Message Only Nurse Assessment Guidelines Guideline Title Affirmed Question Affirmed Notes Nurse Date/Time (Eastern Time) Disp. Time Lamount Cohen(Eastern Time) Disposition Final User 01/10/2015 1:34:14 PM General Information Provided Yes Tory EmeraldJones, Holly After Care Instructions Given Call Event Type User Date / Time Description

## 2015-01-12 NOTE — Telephone Encounter (Signed)
Needs OV to review BP control and change to different med if anything is needed for tx thanks

## 2015-01-14 NOTE — Telephone Encounter (Signed)
Advised pt she needed a OV she said that she did not want to come in and she was just going to stop the amlodipine till she came in on march 16.

## 2015-01-23 ENCOUNTER — Other Ambulatory Visit: Payer: Self-pay | Admitting: Family

## 2015-01-23 ENCOUNTER — Encounter: Payer: Self-pay | Admitting: Family

## 2015-01-23 ENCOUNTER — Ambulatory Visit (INDEPENDENT_AMBULATORY_CARE_PROVIDER_SITE_OTHER): Payer: Medicare Other | Admitting: Family

## 2015-01-23 VITALS — BP 130/80 | HR 56 | Temp 97.9°F | Resp 18 | Wt 170.0 lb

## 2015-01-23 DIAGNOSIS — R059 Cough, unspecified: Secondary | ICD-10-CM

## 2015-01-23 DIAGNOSIS — R05 Cough: Secondary | ICD-10-CM | POA: Diagnosis not present

## 2015-01-23 MED ORDER — CEFUROXIME AXETIL 250 MG PO TABS: 250.0000 mg | ORAL_TABLET | Freq: Two times a day (BID) | ORAL | Status: DC

## 2015-01-23 NOTE — Telephone Encounter (Signed)
Pt is here for an appointment today. Will go by what Tammy SoursGreg says

## 2015-01-23 NOTE — Assessment & Plan Note (Signed)
Symptoms and exam consistent with upper respiratory infection and/or bacterial pharyngitis. Start Ceftin 10 days. Continue over-the-counter medications as needed for symptom relief. Follow up if symptoms worsen or fail to improve.

## 2015-01-23 NOTE — Progress Notes (Signed)
Subjective:    Patient ID: Gloria [name] Mineer, female    DOB: 1951-02-14, 64 y.o.   MRN: [id number]  Chief Complaint  Patient presents with  . Cough    loss of voice, congestion, productive cough, same symptoms as last visit    HPI:  Gloria [name] Marxen is a 64 y.o. female who presents today for an acute visit.  Was recently treated with a sinus infection and completed a course of Augmentin. Associated symptoms of loss of voice, congestion, and productive cough have been going on for about 4 days now. Denies any modifying factors. Indicates that she has gotten progressively worse over the last 4 days.    No Known Allergies   Current Outpatient Prescriptions on File Prior to Visit  Medication Sig Dispense Refill  . amLODipine (NORVASC) 5 MG tablet TAKE 1 TABLET BY MOUTH EVERY DAY 90 tablet 2  . amoxicillin-clavulanate (AUGMENTIN) 875-125 MG per tablet Take 1 tablet by mouth 2 (two) times daily. 20 tablet 0  . atorvastatin (LIPITOR) 10 MG tablet Take 10 mg by mouth daily.    . clobetasol (OLUX) 0.05 % topical foam Apply topically 2 (two) times daily as needed.    . fluconazole (DIFLUCAN) 150 MG tablet Take 1 tablet (150 mg total) by mouth once. 1 tablet 1  . HYDROcodone-homatropine (HYCODAN) 5-1.5 MG/5ML syrup Take 5 mLs by mouth every 8 (eight) hours as needed for cough. 120 mL 0  . levothyroxine (SYNTHROID, LEVOTHROID) 75 MCG tablet TAKE 1 TABLET BY MOUTH EVERY DAY 90 tablet 3  . losartan (COZAAR) 100 MG tablet TAKE 1 TABLET BY MOUTH EVERY DAY 30 tablet 11  . metFORMIN (GLUCOPHAGE-XR) 500 MG 24 hr tablet TAKE 1 TABLET BY MOUTH DAILY WITH BREAKFAST 90 tablet 3  . pyridostigmine (MESTINON) 60 MG tablet Take 60 mg by mouth 3 (three) times daily. Per Neurologist     . ST JOSEPH ASPIRIN PO Take 81 mg by mouth daily.    Marland Kitchen. triamcinolone cream (KENALOG) 0.1 % APPLY TO THE AFFECTED AREA TWICE DAILY 30 g 0   No current facility-administered medications on file prior to visit.    Review of  Systems  Constitutional: Negative for fever and chills.  HENT: Positive for congestion and voice change. Negative for ear pain, facial swelling, sinus pressure and sore throat.   Respiratory: Positive for cough. Negative for chest tightness and shortness of breath.       Objective:    BP 130/80 mmHg  Pulse 56  Temp(Src) 97.9 F (36.6 C) (Oral)  Resp 18  Wt 170 lb (77.111 kg)  SpO2 99% Nursing note and vital signs reviewed.  Physical Exam  Constitutional: She is oriented to person, place, and time. She appears well-developed and well-nourished. No distress.  HENT:  Right Ear: Hearing, tympanic membrane, external ear and ear canal normal.  Left Ear: Hearing, tympanic membrane, external ear and ear canal normal.  Nose: Right sinus exhibits no maxillary sinus tenderness and no frontal sinus tenderness. Left sinus exhibits no maxillary sinus tenderness and no frontal sinus tenderness.  Mouth/Throat: Uvula is midline, oropharynx is clear and moist and mucous membranes are normal.  Neck: Neck supple.  Cardiovascular: Normal rate, regular rhythm, normal heart sounds and intact distal pulses.   Pulmonary/Chest: Effort normal and breath sounds normal.  Lymphadenopathy:    She has no cervical adenopathy.  Neurological: She is alert and oriented to person, place, and time.  Skin: Skin is warm and dry.  Psychiatric: She has  a normal mood and affect. Her behavior is normal. Judgment and thought content normal.       Assessment & Plan:

## 2015-01-23 NOTE — Patient Instructions (Signed)
Thank you for choosing  HealthCare.  Summary/Instructions:  Your prescription(s) have been submitted to your pharmacy or been printed and provided for you. Please take as directed and contact our office if you believe you are having problem(s) with the medication(s) or have any questions.  If your symptoms worsen or fail to improve, please contact our office for further instruction, or in case of emergency go directly to the emergency room at the closest medical facility.   General Recommendations:    Please drink plenty of fluids.  Get plenty of rest   Sleep in humidified air  Use saline nasal sprays  Netti pot   OTC Medications:  Decongestants - helps relieve congestion   Flonase (generic fluticasone) or Nasacort (generic triamcinolone) - please make sure to use the "cross-over" technique at a 45 degree angle towards the opposite eye as opposed to straight up the nasal passageway.   If you have HIGH BLOOD PRESSURE - Coricidin HBP; AVOID any product that is -D as this contains pseudoephedrine which may increase your blood pressure.  Afrin (oxymetazoline) every 6-8 hours for up to 3 days.   Allergies - helps relieve runny nose, itchy eyes and sneezing   Claritin (generic loratidine), Allegra (fexofenidine), or Zyrtec (generic cyrterizine) for runny nose. These medications should not cause drowsiness.  Note - Benadryl (generic diphenhydramine) may be used however may cause drowsiness  Cough -   Delsym or Robitussin (generic dextromethorphan)  Expectorants - helps loosen mucus to ease removal   Mucinex (generic guaifenesin) as directed on the package.  Headaches / General Aches   Tylenol (generic acetaminophen) - DO NOT EXCEED 3 grams (3,000 mg) in a 24 hour time period  Advil/Motrin (generic ibuprofen)   Sore Throat -   Salt water gargle   Chloraseptic (generic benzocaine) spray or lozenges / Sucrets (generic dyclonine)      

## 2015-01-23 NOTE — Telephone Encounter (Signed)
Patient is requesting another refill of   amoxicillin-clavulanate (AUGMENTIN) 875-125 MG per tablet [[id number]][[id number]]       She states that by the time she starts getting better she runs out.

## 2015-01-23 NOTE — Progress Notes (Signed)
Pre visit review using our clinic review tool, if applicable. No additional management support is needed unless otherwise documented below in the visit note. 

## 2015-02-25 ENCOUNTER — Ambulatory Visit: Payer: Medicare Other | Admitting: Internal Medicine

## 2015-04-25 ENCOUNTER — Ambulatory Visit (INDEPENDENT_AMBULATORY_CARE_PROVIDER_SITE_OTHER): Payer: Medicare Other | Admitting: Family Medicine

## 2015-04-25 ENCOUNTER — Ambulatory Visit: Payer: Medicare Other | Admitting: Family Medicine

## 2015-04-25 ENCOUNTER — Encounter: Payer: Self-pay | Admitting: Family Medicine

## 2015-04-25 VITALS — BP 160/94 | HR 53 | Temp 98.1°F | Ht 66.0 in | Wt 171.0 lb

## 2015-04-25 DIAGNOSIS — I10 Essential (primary) hypertension: Secondary | ICD-10-CM

## 2015-04-25 MED ORDER — AMLODIPINE BESYLATE 5 MG PO TABS: 5.0000 mg | ORAL_TABLET | Freq: Every day | ORAL | Status: DC

## 2015-04-25 NOTE — Patient Instructions (Signed)
Follow up in 2-3 weeks to recheck BP Restart the Amlodipine daily Continue the Losartan daily Try and limit your salt intake Drink plenty of water Call with any questions or concerns Have a great weekend!

## 2015-04-25 NOTE — Assessment & Plan Note (Signed)
Deteriorated.  Pt's BP is well above goal but she stopped the Amlodipine 2-3 months ago w/o informing MD.  Pt is asymptomatic w/ exception of 'low level' throbbing HA.  Discussed restarting Amlodipine vs starting a different medication due to her hair loss- she elected to restart Amlodipine.  Discussed that her hair loss could be due to her thyroid issues and recommended she return for an OV to get labs and adjust meds prn.  Reviewed supportive care and red flags that should prompt return.  Pt expressed understanding and is in agreement w/ plan.

## 2015-04-25 NOTE — Progress Notes (Signed)
   Subjective:    Patient ID: Gloria SearingCynthia L [name], female    DOB: [birth year], 64 y.o.   MRN: [id number]  HPI HTN- chronic problem, on Losartan and Amlodipine, but stopped Amlodipine 2-3 months ago due to thinning hair.  BP was 190 at Neuro on Thursday.  Reports she has eaten quite a bit of salt this week (soup, fried fish).  BP is 160/94 today in office.  No CP, SOB, + 'consistent little throbbing in my head', no visual changes.   Review of Systems For ROS see HPI     Objective:   Physical Exam  Constitutional: She is oriented to person, place, and time. She appears well-developed and well-nourished. No distress.  HENT:  Head: Normocephalic and atraumatic.  Eyes: Conjunctivae and EOM are normal. Pupils are equal, round, and reactive to light.  Neck: Normal range of motion. Neck supple. No thyromegaly present.  Cardiovascular: Normal rate, regular rhythm, normal heart sounds and intact distal pulses.   No murmur heard. Pulmonary/Chest: Effort normal and breath sounds normal. No respiratory distress.  Abdominal: Soft. She exhibits no distension. There is no tenderness.  Musculoskeletal: She exhibits no edema.  Lymphadenopathy:    She has no cervical adenopathy.  Neurological: She is alert and oriented to person, place, and time.  Skin: Skin is warm and dry.  Psychiatric: She has a normal mood and affect. Her behavior is normal.  Vitals reviewed.         Assessment & Plan:

## 2015-04-27 ENCOUNTER — Ambulatory Visit: Payer: Medicare Other | Admitting: Family

## 2015-05-12 ENCOUNTER — Telehealth: Payer: Self-pay

## 2015-05-12 ENCOUNTER — Ambulatory Visit (INDEPENDENT_AMBULATORY_CARE_PROVIDER_SITE_OTHER): Payer: Medicare Other | Admitting: Geriatric Medicine

## 2015-05-12 VITALS — BP 128/64

## 2015-05-12 DIAGNOSIS — I10 Essential (primary) hypertension: Secondary | ICD-10-CM | POA: Diagnosis not present

## 2015-05-12 DIAGNOSIS — Z0130 Encounter for examination of blood pressure without abnormal findings: Secondary | ICD-10-CM

## 2015-05-12 NOTE — Telephone Encounter (Signed)
Pt came in for BP check. Had already been called back when I called her back to do the check.   Pt stated that since the amlodipine was started she has been having dizzy spells when she wakes up in the morning, The sx subsides after pt drinks some water and allows her head to "settle".   Any advisement for this pt?

## 2015-05-13 NOTE — Telephone Encounter (Signed)
As BP at goal, not too low, I recommended continue same with water intake as needed to abate symptoms  thanks

## 2015-05-13 NOTE — Telephone Encounter (Signed)
LVM for pt to call back as soon as possible.   RE: MD recommendation below.  

## 2015-05-16 NOTE — Progress Notes (Signed)
I have reviewed and agree with BP as recorded here Rene PaciValerie Leschber, MD

## 2015-06-07 ENCOUNTER — Other Ambulatory Visit: Payer: Self-pay | Admitting: Internal Medicine

## 2015-06-11 ENCOUNTER — Other Ambulatory Visit: Payer: Self-pay | Admitting: Internal Medicine

## 2015-06-16 ENCOUNTER — Encounter (INDEPENDENT_AMBULATORY_CARE_PROVIDER_SITE_OTHER): Payer: Self-pay

## 2015-06-16 ENCOUNTER — Encounter: Payer: Self-pay | Admitting: Internal Medicine

## 2015-06-16 ENCOUNTER — Ambulatory Visit (INDEPENDENT_AMBULATORY_CARE_PROVIDER_SITE_OTHER): Payer: Medicare Other | Admitting: Internal Medicine

## 2015-06-16 ENCOUNTER — Other Ambulatory Visit (INDEPENDENT_AMBULATORY_CARE_PROVIDER_SITE_OTHER): Payer: Medicare Other

## 2015-06-16 VITALS — BP 128/82 | HR 50 | Temp 97.7°F | Ht 66.0 in | Wt 170.0 lb

## 2015-06-16 DIAGNOSIS — E119 Type 2 diabetes mellitus without complications: Secondary | ICD-10-CM

## 2015-06-16 DIAGNOSIS — E785 Hyperlipidemia, unspecified: Secondary | ICD-10-CM | POA: Diagnosis not present

## 2015-06-16 DIAGNOSIS — E039 Hypothyroidism, unspecified: Secondary | ICD-10-CM

## 2015-06-16 DIAGNOSIS — I10 Essential (primary) hypertension: Secondary | ICD-10-CM

## 2015-06-16 LAB — BASIC METABOLIC PANEL
BUN: 12 mg/dL (ref 6–23)
CO2: 29 mEq/L (ref 19–32)
Calcium: 9.2 mg/dL (ref 8.4–10.5)
Chloride: 106 mEq/L (ref 96–112)
Creatinine, Ser: 1.02 mg/dL (ref 0.40–1.20)
GFR: 70.19 mL/min (ref >60.00)
Glucose, Bld: 66 mg/dL — ABNORMAL LOW (ref 70–99)
Potassium: 4.1 mEq/L (ref 3.5–5.1)
Sodium: 141 mEq/L (ref 135–145)

## 2015-06-16 LAB — LIPID PANEL
Cholesterol: 145 mg/dL (ref 0–200)
HDL: 47.7 mg/dL (ref >39.00)
LDL Cholesterol: 80 mg/dL (ref 0–99)
NonHDL: 97.3
Total CHOL/HDL Ratio: 3
Triglycerides: 86 mg/dL (ref 0.0–149.0)
VLDL: 17.2 mg/dL (ref 0.0–40.0)

## 2015-06-16 LAB — MICROALBUMIN / CREATININE URINE RATIO
Creatinine,U: 115.4 mg/dL
Microalb Creat Ratio: 4.2 mg/g (ref 0.0–30.0)
Microalb, Ur: 4.8 mg/dL — ABNORMAL HIGH (ref 0.0–1.9)

## 2015-06-16 LAB — TSH: TSH: 2.8 u[IU]/mL (ref 0.35–4.50)

## 2015-06-16 LAB — HEMOGLOBIN A1C: Hgb A1c MFr Bld: 5.8 % (ref 4.6–6.5)

## 2015-06-16 LAB — HM DIABETES EYE EXAM

## 2015-06-16 MED ORDER — LOSARTAN POTASSIUM 100 MG PO TABS: 100.0000 mg | ORAL_TABLET | Freq: Every day | ORAL | Status: DC

## 2015-06-16 MED ORDER — METFORMIN HCL ER 500 MG PO TB24: 500.0000 mg | ORAL_TABLET | Freq: Every day | ORAL | Status: DC

## 2015-06-16 MED ORDER — LEVOTHYROXINE SODIUM 75 MCG PO TABS: 75.0000 ug | ORAL_TABLET | Freq: Every day | ORAL | Status: DC

## 2015-06-16 NOTE — Assessment & Plan Note (Signed)
Previously on glipize in addition to metformin - but monotherapy only since 12/2010 Reduced dose metformin from bid in 2013 to qd Check a1c now and every 6 mo - ok to do "diet only" if a1c<5.5 Follows with optho annually (yokum) - no DM changes noted Lab Results  Component Value Date   HGBA1C 5.6 06/19/2014

## 2015-06-16 NOTE — Assessment & Plan Note (Signed)
BP Readings from Last 3 Encounters:  06/16/15 128/82  05/12/15 128/64  04/25/15 160/94   Exacerbation spring 2016 when self DC'd amlodipine Resumed same 04/2015 with improvement Continue ARB as well for DM and BP control The patient is asked to make an attempt to improve diet and exercise patterns to aid in medical management of this problem. The current medical regimen is effective;  continue present plan and medications.

## 2015-06-16 NOTE — Assessment & Plan Note (Signed)
On low dose atorva - check lipids annually Last lipids reviewed and at goal The current medical regimen is effective;  continue present plan and medications.

## 2015-06-16 NOTE — Assessment & Plan Note (Signed)
Lab Results  Component Value Date   TSH 7.95* 06/19/2014   Check now and adjust dose as needed

## 2015-06-16 NOTE — Patient Instructions (Addendum)
It was good to see you today.  We have reviewed your prior records including labs and tests today  Test(s) ordered today. Your results will be released to MyChart (or called to you) after review, usually within 72hours after test completion. If any changes need to be made, you will be notified at that same time.  Medications reviewed and updated, no changes recommended at this time. Refill on medication(s) as discussed today.  Please schedule followup in 6 months for semiannual exam and labs, call sooner if problems.  Diabetes and Exercise Exercising regularly is important. It is not just about losing weight. It has many health benefits, such as:  Improving your overall fitness, flexibility, and endurance.  Increasing your bone density.  Helping with weight control.  Decreasing your body fat.  Increasing your muscle strength.  Reducing stress and tension.  Improving your overall health. People with diabetes who exercise gain additional benefits because exercise:  Reduces appetite.  Improves the body's use of blood sugar (glucose).  Helps lower or control blood glucose.  Decreases blood pressure.  Helps control blood lipids (such as cholesterol and triglycerides).  Improves the body's use of the hormone insulin by:  Increasing the body's insulin sensitivity.  Reducing the body's insulin needs.  Decreases the risk for heart disease because exercising:  Lowers cholesterol and triglycerides levels.  Increases the levels of good cholesterol (such as high-density lipoproteins [HDL]) in the body.  Lowers blood glucose levels. YOUR ACTIVITY PLAN  Choose an activity that you enjoy and set realistic goals. Your health care provider or diabetes educator can help you make an activity plan that works for you. Exercise regularly as directed by your health care provider. This includes:  Performing resistance training twice a week such as push-ups, sit-ups, lifting weights, or  using resistance bands.  Performing 150 minutes of cardio exercises each week such as walking, running, or playing sports.  Staying active and spending no more than 90 minutes at one time being inactive. Even short bursts of exercise are good for you. Three 10-minute sessions spread throughout the day are just as beneficial as a single 30-minute session. Some exercise ideas include:  Taking the dog for a walk.  Taking the stairs instead of the elevator.  Dancing to your favorite song.  Doing an exercise video.  Doing your favorite exercise with a friend. RECOMMENDATIONS FOR EXERCISING WITH TYPE 1 OR TYPE 2 DIABETES   Check your blood glucose before exercising. If blood glucose levels are greater than 240 mg/dL, check for urine ketones. Do not exercise if ketones are present.  Avoid injecting insulin into areas of the body that are going to be exercised. For example, avoid injecting insulin into:  The arms when playing tennis.  The legs when jogging.  Keep a record of:  Food intake before and after you exercise.  Expected peak times of insulin action.  Blood glucose levels before and after you exercise.  The type and amount of exercise you have done.  Review your records with your health care provider. Your health care provider will help you to develop guidelines for adjusting food intake and insulin amounts before and after exercising.  If you take insulin or oral hypoglycemic agents, watch for signs and symptoms of hypoglycemia. They include:  Dizziness.  Shaking.  Sweating.  Chills.  Confusion.  Drink plenty of water while you exercise to prevent dehydration or heat stroke. Body water is lost during exercise and must be replaced.  Talk to your  health care provider before starting an exercise program to make sure it is safe for you. Remember, almost any type of activity is better than none. Document Released: 02/18/2004 Document Revised: 04/14/2014 Document  Reviewed: 05/07/2013 [facility] Patient Information 2015 [place], [place]. This information is not intended to replace advice given to you by your health care provider. Make sure you discuss any questions you have with your health care provider.

## 2015-06-16 NOTE — Progress Notes (Signed)
Subjective:    Patient ID: Gloria [name], female    DOB: 22-Jul-1951, 64 y.o.   MRN: [id number]  HPI  Patient here for follow-up. Reviewed chronic conditions, interval events and current concerns  Past Medical History  Diagnosis Date  . Myasthenia gravis   . Diabetes mellitus, type 2   . Hypertension   . Dyslipidemia   . Hypothyroid   . Alopecia areata 10/2010 onset    Review of Systems  Constitutional: Positive for fatigue. Negative for unexpected weight change.  Eyes: Negative for visual disturbance.  Respiratory: Negative for cough and shortness of breath.   Cardiovascular: Negative for chest pain and leg swelling.  Neurological: Negative for weakness, numbness and headaches.       Objective:    Physical Exam  Constitutional: She appears well-developed and well-nourished. No distress.  Cardiovascular: Normal rate, regular rhythm and normal heart sounds.   No murmur heard. Pulmonary/Chest: Effort normal and breath sounds normal. No respiratory distress.  Musculoskeletal: She exhibits no edema.    BP 128/82 mmHg  Pulse 50  Temp(Src) 97.7 F (36.5 C) (Oral)  Ht  (1.676 m)  Wt 170 lb (77.111 kg)  BMI 27.45 kg/m2  SpO2 99% Wt Readings from Last 3 Encounters:  06/16/15 170 lb (77.111 kg)  04/25/15 171 lb (77.565 kg)  01/23/15 170 lb (77.111 kg)     Lab Results  Component Value Date   WBC 8.0 12/25/2013   HGB 13.0 12/25/2013   HCT 38.2 12/25/2013   PLT 234.0 12/25/2013   GLUCOSE 90 12/25/2013   CHOL 153 06/19/2014   TRIG 153.0* 06/19/2014   HDL 44.20 06/19/2014   LDLCALC 78 06/19/2014   ALT 15 12/25/2013   AST 17 12/25/2013   NA 138 12/25/2013   K 4.3 12/25/2013   CL 103 12/25/2013   CREATININE 1.0 12/25/2013   BUN 10 12/25/2013   CO2 28 12/25/2013   TSH 7.95* 06/19/2014   HGBA1C 5.6 06/19/2014    Mm Screening Breast Tomo Bilateral  09/19/2014   CLINICAL DATA:  Screening.  EXAM: DIGITAL SCREENING BILATERAL MAMMOGRAM WITH 3D TOMO WITH CAD   COMPARISON:  Previous exam(s).  ACR Breast Density Category b: There are scattered areas of fibroglandular density.  FINDINGS: There are no findings suspicious for malignancy. Images were processed with CAD.  IMPRESSION: No mammographic evidence of malignancy. A result letter of this screening mammogram will be mailed directly to the patient.  RECOMMENDATION: Screening mammogram in one year. (Code:[id number]-B-01Y)  BI-RADS CATEGORY  1: Negative.   Electronically Signed   By: Britta Mccreedy M.D.   On: 09/19/2014 12:46       Assessment & Plan:   Problem List Items Addressed This Visit    Essential hypertension    BP Readings from Last 3 Encounters:  06/16/15 128/82  05/12/15 128/64  04/25/15 160/94   Exacerbation spring 2016 when self DC'd amlodipine Resumed same 04/2015 with improvement Continue ARB as well for DM and BP control The patient is asked to make an attempt to improve diet and exercise patterns to aid in medical management of this problem. The current medical regimen is effective;  continue present plan and medications.       Relevant Medications   losartan (COZAAR) 100 MG tablet   Hyperlipidemia    On low dose atorva - check lipids annually Last lipids reviewed and at goal The current medical regimen is effective;  continue present plan and medications.      Relevant  Medications   losartan (COZAAR) 100 MG tablet   Hypothyroidism    Lab Results  Component Value Date   TSH 7.95* 06/19/2014   Check now and adjust dose as needed      Relevant Medications   levothyroxine (SYNTHROID, LEVOTHROID) 75 MCG tablet   Other Relevant Orders   TSH   Type 2 diabetes, diet controlled - Primary    Previously on glipize in addition to metformin - but monotherapy only since 12/2010 Reduced dose metformin from bid in 2013 to qd Check a1c now and every 6 mo - ok to do "diet only" if a1c<5.5 Follows with optho annually (yokum) - no DM changes noted Lab Results  Component Value Date    HGBA1C 5.6 06/19/2014        Relevant Medications   losartan (COZAAR) 100 MG tablet   metFORMIN (GLUCOPHAGE-XR) 500 MG 24 hr tablet   Other Relevant Orders   Hemoglobin A1c   Basic metabolic panel   Lipid panel   Microalbumin / creatinine urine ratio       Rene PaciValerie Jennaya Pogue, MD

## 2015-06-16 NOTE — Progress Notes (Signed)
Pre visit review using our clinic review tool, if applicable. No additional management support is needed unless otherwise documented below in the visit note. 

## 2015-07-02 ENCOUNTER — Encounter: Payer: Self-pay | Admitting: Internal Medicine

## 2015-08-28 ENCOUNTER — Other Ambulatory Visit: Payer: Self-pay

## 2015-08-28 DIAGNOSIS — Z1231 Encounter for screening mammogram for malignant neoplasm of breast: Secondary | ICD-10-CM

## 2015-09-21 ENCOUNTER — Other Ambulatory Visit: Payer: Self-pay

## 2015-09-21 MED ORDER — METFORMIN HCL ER 500 MG PO TB24: 500.0000 mg | ORAL_TABLET | Freq: Every day | ORAL | Status: DC

## 2015-09-29 ENCOUNTER — Ambulatory Visit
Admission: RE | Admit: 2015-09-29 | Discharge: 2015-09-29 | Disposition: A | Discharge status: Home / Self Care | Payer: Medicare Other | Source: Ambulatory Visit

## 2015-09-29 DIAGNOSIS — Z1231 Encounter for screening mammogram for malignant neoplasm of breast: Secondary | ICD-10-CM

## 2015-10-16 ENCOUNTER — Telehealth: Payer: Self-pay | Admitting: Internal Medicine

## 2015-10-16 NOTE — Telephone Encounter (Signed)
Patient is requesting a call in regards to her "situation"

## 2015-10-20 NOTE — Telephone Encounter (Signed)
LVM for pt to call back as soon as possible.   

## 2015-10-21 NOTE — Telephone Encounter (Signed)
Left another vm for pt.   Please come and get me directly if this pt calls back. I will take the call regardless of what I am doing.

## 2015-10-21 NOTE — Telephone Encounter (Signed)
Patient is returning your call.   She specifically requests that dr Felicity Coyerleschber calls her directly at her earliest convenience. She states that if dr Felicity Coyerleschber gets her vm, then to leave a time and # that the patient can call her back at.   She would not give any information other than the fact that this is extremely urgent, about her medical conditions.

## 2015-12-22 ENCOUNTER — Encounter: Payer: Self-pay | Admitting: Internal Medicine

## 2015-12-22 ENCOUNTER — Other Ambulatory Visit (INDEPENDENT_AMBULATORY_CARE_PROVIDER_SITE_OTHER): Payer: Medicare Other

## 2015-12-22 ENCOUNTER — Ambulatory Visit: Payer: Medicare Other | Admitting: Internal Medicine

## 2015-12-22 ENCOUNTER — Ambulatory Visit (INDEPENDENT_AMBULATORY_CARE_PROVIDER_SITE_OTHER): Payer: Medicare Other | Admitting: Internal Medicine

## 2015-12-22 VITALS — BP 146/88 | HR 52 | Temp 97.8°F | Resp 16 | Wt 170.0 lb

## 2015-12-22 DIAGNOSIS — E039 Hypothyroidism, unspecified: Secondary | ICD-10-CM | POA: Diagnosis not present

## 2015-12-22 DIAGNOSIS — E119 Type 2 diabetes mellitus without complications: Secondary | ICD-10-CM | POA: Diagnosis not present

## 2015-12-22 DIAGNOSIS — I10 Essential (primary) hypertension: Secondary | ICD-10-CM

## 2015-12-22 DIAGNOSIS — E785 Hyperlipidemia, unspecified: Secondary | ICD-10-CM

## 2015-12-22 DIAGNOSIS — G7000 Myasthenia gravis without (acute) exacerbation: Secondary | ICD-10-CM

## 2015-12-22 LAB — LIPID PANEL
Cholesterol: 180 mg/dL (ref 0–200)
HDL: 52.5 mg/dL (ref >39.00)
LDL Cholesterol: 96 mg/dL (ref 0–99)
NonHDL: 127.98
Total CHOL/HDL Ratio: 3
Triglycerides: 158 mg/dL — ABNORMAL HIGH (ref 0.0–149.0)
VLDL: 31.6 mg/dL (ref 0.0–40.0)

## 2015-12-22 LAB — COMPREHENSIVE METABOLIC PANEL
ALT: 15 U/L (ref 0–35)
AST: 21 U/L (ref 0–37)
Albumin: 4.4 g/dL (ref 3.5–5.2)
Alkaline Phosphatase: 71 U/L (ref 39–117)
BUN: 10 mg/dL (ref 6–23)
CO2: 30 mEq/L (ref 19–32)
Calcium: 9.7 mg/dL (ref 8.4–10.5)
Chloride: 104 mEq/L (ref 96–112)
Creatinine, Ser: 0.96 mg/dL (ref 0.40–1.20)
GFR: 75.15 mL/min (ref >60.00)
Glucose, Bld: 101 mg/dL — ABNORMAL HIGH (ref 70–99)
Potassium: 4.3 mEq/L (ref 3.5–5.1)
Sodium: 140 mEq/L (ref 135–145)
Total Bilirubin: 0.4 mg/dL (ref 0.2–1.2)
Total Protein: 8 g/dL (ref 6.0–8.3)

## 2015-12-22 LAB — HEMOGLOBIN A1C: Hgb A1c MFr Bld: 5.7 % (ref 4.6–6.5)

## 2015-12-22 LAB — TSH: TSH: 5.05 u[IU]/mL — ABNORMAL HIGH (ref 0.35–4.50)

## 2015-12-22 MED ORDER — ATORVASTATIN CALCIUM 10 MG PO TABS: 10.0000 mg | ORAL_TABLET | Freq: Every day | ORAL | Status: DC

## 2015-12-22 NOTE — Progress Notes (Signed)
Pre visit review using our clinic review tool, if applicable. No additional management support is needed unless otherwise documented below in the visit note. 

## 2015-12-22 NOTE — Assessment & Plan Note (Signed)
Check tsh Will titrate medication if needed 

## 2015-12-22 NOTE — Progress Notes (Signed)
Subjective:    Patient ID: Gloria [name], female    DOB: Aug 17, 1951, 65 y.o.   MRN: [id number]  HPI She is here to establish with a new pcp.  She is here for routine follow up.  She did not take any of her medication this morning.   Hyperlipidemia: She is taking her medication daily. She is compliant with a low fat/cholesterol diet. She is exercising regularly. She denies myalgias.   Diabetes: She is taking her medication daily as prescribed. She is compliant with a diabetic diet. She is exercising regularly - at least three days a week. She is up-to-date with an ophthalmology examination.   Hypertension: She is taking her medication daily. She is compliant with a low sodium diet.  She denies chest pain, palpitations, edema, shortness of breath and regular headaches. She is exercising regularly.  She does not monitor her blood pressure at home.    Hypothyroidism:  She is taking her medication daily.  She denies any recent changes in energy or weight that are unexplained.   Myasthenia gravis:  She is following with neurology. She is taking medication 3 times daily as prescribed. She denies any symptoms associated with the myasthenia gravis.  Hair thinning:  For a while she has had thinning of her hair and she is concerned it is related to either the amlodipine or levothyroxine.  She has seen a dermatologist.  He did not say why her hair has changed, but did prescribe clobetasol.  That has helped slightly, but she still has hair thinning. She would ideally like to come off of all her medications.  Medications and allergies reviewed with patient and updated if appropriate.  Patient Active Problem List   Diagnosis Date Noted  . Cough 12/19/2014  . MYASTHENIA GRAVIS WITHOUT EXACERBATION 12/31/2010  . ALOPECIA 12/31/2010  . Hypothyroidism 12/29/2010  . Type 2 diabetes, diet controlled (HCC) 12/29/2010  . Hyperlipidemia 12/29/2010  . Essential hypertension 12/29/2010  . CORONARY ARTERY  DISEASE 12/29/2010  . GERD 12/29/2010  . OSTEOARTHRITIS 12/29/2010    Current Outpatient Prescriptions on File Prior to Visit  Medication Sig Dispense Refill  . amLODipine (NORVASC) 5 MG tablet Take 1 tablet (5 mg total) by mouth daily. 30 tablet 1  . atorvastatin (LIPITOR) 10 MG tablet Take 10 mg by mouth daily.    . clobetasol (OLUX) 0.05 % topical foam Apply topically 2 (two) times daily as needed.    Marland Kitchen levothyroxine (SYNTHROID, LEVOTHROID) 75 MCG tablet Take 1 tablet (75 mcg total) by mouth daily before breakfast. 90 tablet 3  . losartan (COZAAR) 100 MG tablet Take 1 tablet (100 mg total) by mouth daily. 90 tablet 3  . metFORMIN (GLUCOPHAGE-XR) 500 MG 24 hr tablet Take 1 tablet (500 mg total) by mouth daily with breakfast. 90 tablet 3  . pyridostigmine (MESTINON) 60 MG tablet Take 60 mg by mouth 3 (three) times daily. Per Neurologist     . ST JOSEPH ASPIRIN PO Take 81 mg by mouth daily.    Marland Kitchen triamcinolone cream (KENALOG) 0.1 % APPLY TO THE AFFECTED AREA TWICE DAILY 30 g 0   No current facility-administered medications on file prior to visit.    Past Medical History  Diagnosis Date  . Myasthenia gravis   . Diabetes mellitus, type 2 (HCC)   . Hypertension   . Dyslipidemia   . Hypothyroid   . Alopecia areata 10/2010 onset    Past Surgical History  Procedure Laterality Date  . Abdominal hysterectomy  1990    Social History   Social History  . Marital Status: Divorced    Spouse Name: N/A  . Number of Children: N/A  . Years of Education: N/A   Social History Main Topics  . Smoking status: Never Smoker   . Smokeless tobacco: None  . Alcohol Use: No  . Drug Use: None  . Sexual Activity: Not Asked   Other Topics Concern  . None   Social History Narrative    Family History  Problem Relation Age of Onset  . Adopted: Yes    Review of Systems  Constitutional: Negative for fever and chills.  Respiratory: Negative for cough, shortness of breath and wheezing.     Cardiovascular: Negative for chest pain, palpitations and leg swelling.  Gastrointestinal: Negative for nausea and abdominal pain.       No GERD  Musculoskeletal: Negative for myalgias.  Neurological: Negative for dizziness, light-headedness and headaches.       Objective:   Filed Vitals:   12/22/15 1009  BP: 146/88  Pulse: 52  Temp: 97.8 F (36.6 C)  Resp: 16   Filed Weights   12/22/15 1009  Weight: 170 lb (77.111 kg)   Body mass index is 27.45 kg/(m^2).   Physical Exam Constitutional: Appears well-developed and well-nourished. No distress.  Neck: Neck supple. No tracheal deviation present. No thyromegaly present.  No carotid bruit. No cervical adenopathy.   Cardiovascular: Normal rate, regular rhythm and normal heart sounds.   No murmur heard.  No edema Pulmonary/Chest: Effort normal and breath sounds normal. No respiratory distress. No wheezes.       Assessment & Plan:   See Problem List.

## 2015-12-22 NOTE — Patient Instructions (Addendum)
  We have reviewed your prior records including labs and tests today.  Test(s) ordered today. Your results will be released to MyChart (or called to you) after review, usually within 72hours after test completion. If any changes need to be made, you will be notified at that same time.  All other Health Maintenance issues reviewed.   All recommended immunizations and age-appropriate screenings are up-to-date.  No immunizations administered today.   Medications reviewed and updated.  No changes recommended at this time.  Please schedule followup in 6 months for a wellness visit.

## 2015-12-22 NOTE — Assessment & Plan Note (Signed)
Lab Results  Component Value Date   HGBA1C 5.8 06/16/2015   Taking metformin 500 mg once daily Recheck a1c Up to date with eye exams ([facility])

## 2015-12-22 NOTE — Assessment & Plan Note (Signed)
On lipitor 10 mg daily Last lipid panel 6 months controlled Recheck lipid panel, cmp

## 2015-12-22 NOTE — Assessment & Plan Note (Signed)
Following with nephrology, currently asymptomatic

## 2015-12-22 NOTE — Assessment & Plan Note (Addendum)
Taking amlodipine 5 mg daily and cozaar 100 mg daily  BP Readings from Last 3 Encounters:  12/22/15 146/88  06/16/15 128/82  05/12/15 128/64   Blood pressure elevated here today, but she did not take her medications this morning Continue amlodipine and Cozaar at current doses Explained to her that if she wants to discontinue one of her medications, especially the blood pressure medications we would need to substitute a different medication for Check CMP Continue regular exercise, work on weight loss

## 2015-12-27 ENCOUNTER — Telehealth: Payer: Self-pay | Admitting: Internal Medicine

## 2015-12-27 DIAGNOSIS — E039 Hypothyroidism, unspecified: Secondary | ICD-10-CM

## 2015-12-27 MED ORDER — LEVOTHYROXINE SODIUM 88 MCG PO TABS: 88.0000 ug | ORAL_TABLET | Freq: Every day | ORAL | Status: DC

## 2015-12-27 NOTE — Telephone Encounter (Signed)
Blood work results -- kidney function, liver tests and sugar are normal.  Cholesterol is good.  Thyroid function is low and we need to increase her medication dose to 88 mcg daily - I sent a new rx to her pof.  We need to recheck her tsh in 8 weeks - blood work ordered.

## 2015-12-28 NOTE — Telephone Encounter (Signed)
Spoke with pt to inform.  

## 2015-12-30 ENCOUNTER — Ambulatory Visit (INDEPENDENT_AMBULATORY_CARE_PROVIDER_SITE_OTHER): Payer: Medicare Other | Admitting: Podiatry

## 2015-12-30 ENCOUNTER — Other Ambulatory Visit: Payer: Self-pay | Admitting: Podiatry

## 2015-12-30 DIAGNOSIS — M722 Plantar fascial fibromatosis: Secondary | ICD-10-CM | POA: Diagnosis not present

## 2015-12-30 DIAGNOSIS — M205X9 Other deformities of toe(s) (acquired), unspecified foot: Secondary | ICD-10-CM

## 2015-12-30 MED ORDER — MELOXICAM 15 MG PO TABS: 15.0000 mg | ORAL_TABLET | Freq: Every day | ORAL | Status: DC

## 2015-12-30 NOTE — Progress Notes (Signed)
   Subjective:    Patient ID: Gloria [name], female    DOB: 08-Mar-1951, 65 y.o.   MRN: [id number]  HPI this patient presents to the office with chief complaint of painful right heel. She states she has pain upon rising in the morning and standing from a sitting position. She says she even feels pain on the back of her heel. She says she has used insoles and taken Mobic but the problem persists. She denies any history of trauma or injury to the foot. She presents the office for further evaluation and treatment  The patient presents here today with right arch/heel pain that started 3-4 months ago.  Review of Systems  All other systems reviewed and are negative.      Objective:   Physical Exam GENERAL APPEARANCE: Alert, conversant. Appropriately groomed. No acute distress.  VASCULAR: Pedal pulses palpable at  [facility] and PT bilateral.  Capillary refill time is immediate to all digits,  Normal temperature gradient.  Digital hair growth is present bilateral  NEUROLOGIC: sensation is normal to 5.07 monofilament at 5/5 sites bilateral.  Light touch is intact bilateral, Muscle strength normal.  MUSCULOSKELETAL: acceptable muscle strength, tone and stability bilateral.  Intrinsic muscluature intact bilateral.  Rectus appearance of foot and digits noted bilateral. Palpable pain noted at the insertion plantar fascia right foot.  Hallux limitus 1st MPJ B/L  DERMATOLOGIC: skin color, texture, and turgor are within normal limits.  No preulcerative lesions or ulcers  are seen, no interdigital maceration noted.  No open lesions present.  Digital nails are asymptomatic. No drainage noted.         Assessment & Plan:  Plantar fascitis right heel  Hallux limitus 1st MPJ B/L  IE  Injection therapy.  Prescribe Mobic.  Use her orthoses for 2 weeks.  RTC 2 weeks.   Helane Gunther DPM

## 2016-01-13 ENCOUNTER — Encounter: Payer: Self-pay | Admitting: Podiatry

## 2016-01-13 ENCOUNTER — Ambulatory Visit (INDEPENDENT_AMBULATORY_CARE_PROVIDER_SITE_OTHER): Payer: Medicare Other | Admitting: Podiatry

## 2016-01-13 VITALS — BP 115/75 | HR 62 | Resp 14

## 2016-01-13 DIAGNOSIS — M722 Plantar fascial fibromatosis: Secondary | ICD-10-CM | POA: Diagnosis not present

## 2016-01-13 DIAGNOSIS — M205X9 Other deformities of toe(s) (acquired), unspecified foot: Secondary | ICD-10-CM | POA: Diagnosis not present

## 2016-01-13 MED ORDER — MELOXICAM 15 MG PO TABS: 15.0000 mg | ORAL_TABLET | Freq: Every day | ORAL | Status: DC

## 2016-01-13 NOTE — Progress Notes (Signed)
Subjective:     Patient ID: Gloria [name], female   DOB: Jun 22, 1951, 65 y.o.   MRN: [id number]  HPIThis patient returns to the office saying she is 90 % improved in her right heel.  She has been taking Mobic and wearing her orthoses.  The injection helped improve her heel.  She is pleased with her improvement.   Review of Systems     Objective:   Physical Exam GENERAL APPEARANCE: Alert, conversant. Appropriately groomed. No acute distress.  VASCULAR: Pedal pulses palpable at  [facility] and PT bilateral.  Capillary refill time is immediate to all digits,  Normal temperature gradient.  Digital hair growth is present bilateral  NEUROLOGIC: sensation is normal to 5.07 monofilament at 5/5 sites bilateral.  Light touch is intact bilateral, Muscle strength normal.  MUSCULOSKELETAL: acceptable muscle strength, tone and stability bilateral.  Intrinsic muscluature intact bilateral.  Rectus appearance of foot and digits noted bilateral. Pain has resolved at the insertion plantar facia right foot.  DERMATOLOGIC: skin color, texture, and turgor are within normal limits.  No preulcerative lesions or ulcers  are seen, no interdigital maceration noted.  No open lesions present.  Digital nails are asymptomatic. No drainage noted.     Assessment:     Plantar Fascitis right heel     Plan:     ROV  Prescribe refill Mobic to take prn.  TYC prn  Helane Gunther DPM

## 2016-05-26 ENCOUNTER — Ambulatory Visit (INDEPENDENT_AMBULATORY_CARE_PROVIDER_SITE_OTHER): Payer: Medicare Other | Admitting: Podiatry

## 2016-05-26 ENCOUNTER — Encounter: Payer: Self-pay | Admitting: Podiatry

## 2016-05-26 VITALS — BP 143/90 | HR 56 | Resp 14

## 2016-05-26 DIAGNOSIS — M779 Enthesopathy, unspecified: Secondary | ICD-10-CM

## 2016-05-26 DIAGNOSIS — M722 Plantar fascial fibromatosis: Secondary | ICD-10-CM | POA: Diagnosis not present

## 2016-05-26 DIAGNOSIS — M2010 Hallux valgus (acquired), unspecified foot: Secondary | ICD-10-CM | POA: Diagnosis not present

## 2016-05-26 NOTE — Progress Notes (Signed)
Subjective:     Patient ID: Gloria [name], female   DOB: 1951-09-23, 65 y.o.   MRN: [id number]  HPI this patient presents the office with chief complaint of painful feet. He says that her right foot is more painful than her left foot. She was seen here in February and treated for an acute plantar fasciitis, right foot, which resolved with the injection and multiple she states since the pain is started to return. She has been taking the Mobic daily and the problem worsens states she is not able to wear her orthotics in her dress shoes which causes a problem. She also states that she has been experiencing burning  pain through the bottom of her arch on her right foot greater than her left. Finally, she is having pain noted at the back of her right heel. She presents the office today for an evaluation and treatment of this condition   Review of Systems     Objective:   Physical Exam GENERAL APPEARANCE: Alert, conversant. Appropriately groomed. No acute distress.  VASCULAR: [facility]  [facility]  [facility].  Capillary refill time is immediate to all digits,  Normal temperature gradient.  Digital hair growth is present bilateral  NEUROLOGIC: sensation is normal to 5.07 monofilament at 5/5 sites bilateral.  Light touch is intact bilateral, Muscle strength normal.  MUSCULOSKELETAL: acceptable muscle strength, tone and stability bilateral.  Intrinsic muscluature intact bilateral.  Rectus appearance of foot and digits noted bilateral. HAV 1st MPJ  B/L. Pain at the insertion plantar fascia right foot.  Pain at the insertion achilles tendon right foot.    DERMATOLOGIC: skin color, texture, and turgor are within normal limits.  No preulcerative lesions or ulcers  are seen, no interdigital maceration noted.  No open lesions present.  Digital nails are asymptomatic. No drainage noted.      Assessment:     Plantar fascitis  Achilles tendinitis right  Hav 1st MPJ  B/L    Plan:     ROV   Injection therapy.  Told her to take Mobic for achilles tendinitis right foot.  Dispense plantar fascial brace right foot.  Take Mobic daily.  RTC 3 weeks.   Helane GuntherGregory Alveta Quintela DPM

## 2016-06-07 ENCOUNTER — Other Ambulatory Visit: Payer: Self-pay | Admitting: *Deleted

## 2016-06-07 MED ORDER — LEVOTHYROXINE SODIUM 88 MCG PO TABS: 88.0000 ug | ORAL_TABLET | Freq: Every day | ORAL | Status: DC

## 2016-06-15 ENCOUNTER — Ambulatory Visit (INDEPENDENT_AMBULATORY_CARE_PROVIDER_SITE_OTHER): Payer: Medicare Other | Admitting: Podiatry

## 2016-06-15 ENCOUNTER — Telehealth: Payer: Self-pay | Admitting: Podiatry

## 2016-06-15 ENCOUNTER — Encounter: Payer: Self-pay | Admitting: Podiatry

## 2016-06-15 VITALS — BP 137/74 | HR 49

## 2016-06-15 DIAGNOSIS — M722 Plantar fascial fibromatosis: Secondary | ICD-10-CM

## 2016-06-15 NOTE — Telephone Encounter (Signed)
Pt called and asked for you to please call her back. °

## 2016-06-15 NOTE — Progress Notes (Signed)
Subjective:     Patient ID: Gloria SearingCynthia L [name], female   DOB: Aug 08, 1951, 65 y.o.   MRN: [id number]  HPI this patient returns to the office with continued pain noted in her right heel. She was diagnosed with plantar fasciitis and Achilles tendinitis of her right foot. She also has severe bunions big toe joints both feet. She was treated with injection therapy, which she says I gave her through the bottom of her foot and not from the side as I traditionally do.  She has been taking the Mobic which she says has helped reduce pain in her Achilles, but it still persists also has worn the plantar fascial brace on her right foot but placed it on her skin causing a blackened  Inflammatory response on the front of her right ankle.  She presents saying her heel is continuing to be painful. She presents for continued evaluation and treatment. Patient is diabetic. Review of Systems     Objective:   Physical Exam GENERAL APPEARANCE: Alert, conversant. Appropriately groomed. No acute distress.  VASCULAR: [facility]  [facility].  Capillary refill time is immediate to all digits,  Normal temperature gradient.  Digital hair growth is present bilateral  NEUROLOGIC: sensation is normal to 5.07 monofilament at 5/5 sites bilateral.  Light touch is intact bilateral, Muscle strength normal.  MUSCULOSKELETAL: acceptable muscle strength, tone and stability bilateral.  Intrinsic muscluature intact bilateral.  Rectus appearance of foot and digits noted bilateral. HAV 1st MPJ  B/L.  Pain persists at insertion plantar fascia right foot. Pain at insertion achilles tendon right foot.  DERMATOLOGIC: skin color, texture, and turgor are within normal limits.  No preulcerative lesions or ulcers  are seen, no interdigital maceration noted.  No open lesions present.  Digital nails are asymptomatic. No drainage noted.      Assessment:     Plantar fascitis right foot  Achilles tendinitis Right.     Plan:      ROV  Injection therapy.  Mobic as tolerated.  Continue with plantar fascial brace.   Patient decline cam walker.   RTC 3 weeks   Helane GuntherGregory Emmet Messer DPM

## 2016-06-22 ENCOUNTER — Encounter: Payer: Self-pay | Admitting: Internal Medicine

## 2016-06-22 ENCOUNTER — Other Ambulatory Visit (INDEPENDENT_AMBULATORY_CARE_PROVIDER_SITE_OTHER): Payer: Medicare Other

## 2016-06-22 ENCOUNTER — Ambulatory Visit (INDEPENDENT_AMBULATORY_CARE_PROVIDER_SITE_OTHER): Payer: Medicare Other | Admitting: Internal Medicine

## 2016-06-22 VITALS — BP 124/86 | HR 56 | Temp 98.3°F | Resp 16 | Wt 167.0 lb

## 2016-06-22 DIAGNOSIS — E785 Hyperlipidemia, unspecified: Secondary | ICD-10-CM

## 2016-06-22 DIAGNOSIS — E039 Hypothyroidism, unspecified: Secondary | ICD-10-CM

## 2016-06-22 DIAGNOSIS — I10 Essential (primary) hypertension: Secondary | ICD-10-CM | POA: Diagnosis not present

## 2016-06-22 DIAGNOSIS — M722 Plantar fascial fibromatosis: Secondary | ICD-10-CM | POA: Insufficient documentation

## 2016-06-22 DIAGNOSIS — E119 Type 2 diabetes mellitus without complications: Secondary | ICD-10-CM

## 2016-06-22 DIAGNOSIS — R29898 Other symptoms and signs involving the musculoskeletal system: Secondary | ICD-10-CM

## 2016-06-22 LAB — COMPREHENSIVE METABOLIC PANEL
ALT: 10 U/L (ref 0–35)
AST: 16 U/L (ref 0–37)
Albumin: 4.3 g/dL (ref 3.5–5.2)
Alkaline Phosphatase: 66 U/L (ref 39–117)
BUN: 16 mg/dL (ref 6–23)
CO2: 31 mEq/L (ref 19–32)
Calcium: 9.5 mg/dL (ref 8.4–10.5)
Chloride: 104 mEq/L (ref 96–112)
Creatinine, Ser: 1.01 mg/dL (ref 0.40–1.20)
GFR: 70.77 mL/min (ref >60.00)
Glucose, Bld: 112 mg/dL — ABNORMAL HIGH (ref 70–99)
Potassium: 4.3 mEq/L (ref 3.5–5.1)
Sodium: 139 mEq/L (ref 135–145)
Total Bilirubin: 0.5 mg/dL (ref 0.2–1.2)
Total Protein: 7.7 g/dL (ref 6.0–8.3)

## 2016-06-22 LAB — CBC WITH DIFFERENTIAL/PLATELET
Basophils Absolute: 0 10*3/uL (ref 0.0–0.1)
Basophils Relative: 0.6 % (ref 0.0–3.0)
Eosinophils Absolute: 0.4 10*3/uL (ref 0.0–0.7)
Eosinophils Relative: 7 % — ABNORMAL HIGH (ref 0.0–5.0)
HCT: 39.2 % (ref 36.0–46.0)
Hemoglobin: 12.9 g/dL (ref 12.0–15.0)
Lymphocytes Relative: 26.9 % (ref 12.0–46.0)
Lymphs Abs: 1.6 10*3/uL (ref 0.7–4.0)
MCHC: 33 g/dL (ref 30.0–36.0)
MCV: 91.3 fl (ref 78.0–100.0)
Monocytes Absolute: 0.5 10*3/uL (ref 0.1–1.0)
Monocytes Relative: 8.7 % (ref 3.0–12.0)
Neutro Abs: 3.3 10*3/uL (ref 1.4–7.7)
Neutrophils Relative %: 56.8 % (ref 43.0–77.0)
Platelets: 204 10*3/uL (ref 150.0–400.0)
RBC: 4.29 Mil/uL (ref 3.87–5.11)
RDW: 13.5 % (ref 11.5–15.5)
WBC: 5.8 10*3/uL (ref 4.0–10.5)

## 2016-06-22 LAB — LIPID PANEL
Cholesterol: 190 mg/dL (ref 0–200)
HDL: 64.5 mg/dL (ref >39.00)
LDL Cholesterol: 111 mg/dL — ABNORMAL HIGH (ref 0–99)
NonHDL: 125.61
Total CHOL/HDL Ratio: 3
Triglycerides: 74 mg/dL (ref 0.0–149.0)
VLDL: 14.8 mg/dL (ref 0.0–40.0)

## 2016-06-22 LAB — HEMOGLOBIN A1C: Hgb A1c MFr Bld: 5.6 % (ref 4.6–6.5)

## 2016-06-22 LAB — TSH: TSH: 0.49 u[IU]/mL (ref 0.35–4.50)

## 2016-06-22 MED ORDER — AMLODIPINE BESYLATE 5 MG PO TABS: 5.0000 mg | ORAL_TABLET | Freq: Every day | ORAL | Status: DC

## 2016-06-22 MED ORDER — LOSARTAN POTASSIUM 100 MG PO TABS: 100.0000 mg | ORAL_TABLET | Freq: Every day | ORAL | Status: DC

## 2016-06-22 MED ORDER — ATORVASTATIN CALCIUM 10 MG PO TABS: 10.0000 mg | ORAL_TABLET | Freq: Every day | ORAL | Status: DC

## 2016-06-22 NOTE — Assessment & Plan Note (Signed)
Check tsh  Titrate med dose if needed  

## 2016-06-22 NOTE — Progress Notes (Signed)
Subjective:    Patient ID: Gloria [name], female    DOB: [birth year], 65 y.o.   MRN: [id number]  HPI She is here for follow up.  Diabetes: She is taking her medication daily as prescribed. She is compliant with a diabetic diet. She is exercising regularly - 2-3 days a week, does water aerobics. She does not monitor her sugars.    Hyperlipidemia: She is taking her medication daily. She is compliant with a low fat/cholesterol diet. She is exercising regularly. She denies myalgias.   Hypertension: She is taking her medication daily. She is compliant with a low sodium diet.  She denies chest pain, palpitations, edema, shortness of breath and regular headaches. She is exercising regularly.  She does not monitor her blood pressure at home.    Hypothyroidism:  She is taking her medication daily.  She denies any recent changes in energy or weight that are unexplained.   Leg weakness:  In the past couple of months when she walks her legs get very tired and weak - only when she walks longer distances.  It is not all the time.  Her neurologist recommended a manual wheelchair and she wondered about an [employer] wheelchair. She has not gotten the manual wheelchair and she feels she may not be able to use it.  She has not noticed it with any other muscles and denies changes in her vision.  She often has to rest for a while before she is able to walk again.  She did mention this to her neurologist and he wants to see her for follow up.  He did give her a script for a manual wheelchair.     Medications and allergies reviewed with patient and updated if appropriate.  Patient Active Problem List   Diagnosis Date Noted  . Cough 12/19/2014  . MYASTHENIA GRAVIS WITHOUT EXACERBATION 12/31/2010  . ALOPECIA 12/31/2010  . Hypothyroidism 12/29/2010  . Type 2 diabetes, diet controlled (HCC) 12/29/2010  . Hyperlipidemia 12/29/2010  . Essential hypertension 12/29/2010  . CORONARY ARTERY DISEASE 12/29/2010  .  GERD 12/29/2010  . OSTEOARTHRITIS 12/29/2010    Current Outpatient Prescriptions on File Prior to Visit  Medication Sig Dispense Refill  . amLODipine (NORVASC) 5 MG tablet Take 1 tablet (5 mg total) by mouth daily. 30 tablet 1  . atorvastatin (LIPITOR) 10 MG tablet Take 1 tablet (10 mg total) by mouth daily. 90 tablet 1  . levothyroxine (SYNTHROID, LEVOTHROID) 88 MCG tablet Take 1 tablet (88 mcg total) by mouth daily. 90 tablet 2  . losartan (COZAAR) 100 MG tablet Take 1 tablet (100 mg total) by mouth daily. 90 tablet 3  . meloxicam (MOBIC) 15 MG tablet Take 1 tablet (15 mg total) by mouth daily. 30 tablet 1  . metFORMIN (GLUCOPHAGE-XR) 500 MG 24 hr tablet Take 1 tablet (500 mg total) by mouth daily with breakfast. 90 tablet 3  . pyridostigmine (MESTINON) 60 MG tablet Take 60 mg by mouth 3 (three) times daily. Per Neurologist     . ST JOSEPH ASPIRIN PO Take 81 mg by mouth daily.    Marland Kitchen. triamcinolone cream (KENALOG) 0.1 % APPLY TO THE AFFECTED AREA TWICE DAILY 30 g 0   No current facility-administered medications on file prior to visit.    Past Medical History  Diagnosis Date  . Myasthenia gravis   . Diabetes mellitus, type 2 (HCC)   . Hypertension   . Dyslipidemia   . Hypothyroid   . Alopecia areata 10/2010 onset  Past Surgical History  Procedure Laterality Date  . Abdominal hysterectomy  1990    Social History   Social History  . Marital Status: Divorced    Spouse Name: N/A  . Number of Children: N/A  . Years of Education: N/A   Social History Main Topics  . Smoking status: Never Smoker   . Smokeless tobacco: Not on file  . Alcohol Use: No  . Drug Use: Not on file  . Sexual Activity: Not on file   Other Topics Concern  . Not on file   Social History Narrative    Family History  Problem Relation Age of Onset  . Adopted: Yes    Review of Systems  Constitutional: Negative for fever.  Eyes: Negative for visual disturbance.  Respiratory: Positive for cough  (? change in weather). Negative for shortness of breath and wheezing.   Cardiovascular: Negative for chest pain, palpitations and leg swelling.  Neurological: Negative for dizziness, light-headedness and headaches.       Objective:   Filed Vitals:   06/22/16 1036  BP: 124/86  Pulse: 56  Temp: 98.3 F (36.8 C)  Resp: 16   Filed Weights   06/22/16 1036  Weight: 167 lb (75.751 kg)   Body mass index is 26.97 kg/(m^2).   Physical Exam Constitutional: Appears well-developed and well-nourished. No distress.  Neck: Neck supple. No tracheal deviation present. No thyromegaly present.  No carotid bruit. No cervical adenopathy.   Cardiovascular: Normal rate, regular rhythm and normal heart sounds.   No murmur heard.  No edema Pulmonary/Chest: Effort normal and breath sounds normal. No respiratory distress. No wheezes.       Assessment & Plan:   See Problem List for Assessment and Plan of chronic medical problems.

## 2016-06-22 NOTE — Assessment & Plan Note (Signed)
On atorvastatin 10 mg  Check lipids

## 2016-06-22 NOTE — Patient Instructions (Addendum)
  Test(s) ordered today. Your results will be released to MyChart (or called to you) after review, usually within 72hours after test completion. If any changes need to be made, you will be notified at that same time.  All other Health Maintenance issues reviewed.   All recommended immunizations and age-appropriate screenings are up-to-date or discussed.  No immunizations administered today.   Medications reviewed and updated.  No changes recommended at this time.  Your prescription(s) have been submitted to your pharmacy. Please take as directed and contact our office if you believe you are having problem(s) with the medication(s).   Please followup in 6 months for a wellness visit

## 2016-06-22 NOTE — Assessment & Plan Note (Signed)
Lab Results  Component Value Date   HGBA1C 5.7 12/22/2015   Recheck a1c Exercising, compliant with diabetic diet Continue current dose of metformin Will schedule an eye exam

## 2016-06-22 NOTE — Assessment & Plan Note (Signed)
?   Related to myasthenia or deconditioning vs something else She needs to f/u with neuro to help determine that Does not really need a wheelchair at this time, but may benefit from a rolling walker with a seat so she can rest, but stressed the importance of keeping active and muscles working

## 2016-06-22 NOTE — Assessment & Plan Note (Signed)
BP well controlled Current regimen effective and well tolerated Continue current medications at current doses cmp  

## 2016-06-22 NOTE — Progress Notes (Signed)
Pre visit review using our clinic review tool, if applicable. No additional management support is needed unless otherwise documented below in the visit note. 

## 2016-06-27 ENCOUNTER — Telehealth: Payer: Self-pay | Admitting: Emergency Medicine

## 2016-06-27 DIAGNOSIS — E119 Type 2 diabetes mellitus without complications: Secondary | ICD-10-CM

## 2016-06-27 NOTE — Telephone Encounter (Signed)
MD d/ced Metformin. Recheck A1c in 3 months.

## 2016-07-06 ENCOUNTER — Ambulatory Visit (INDEPENDENT_AMBULATORY_CARE_PROVIDER_SITE_OTHER): Payer: Medicare Other | Admitting: Podiatry

## 2016-07-06 DIAGNOSIS — M722 Plantar fascial fibromatosis: Secondary | ICD-10-CM

## 2016-07-06 DIAGNOSIS — B351 Tinea unguium: Secondary | ICD-10-CM | POA: Diagnosis not present

## 2016-07-06 DIAGNOSIS — M79676 Pain in unspecified toe(s): Secondary | ICD-10-CM | POA: Diagnosis not present

## 2016-07-06 NOTE — Progress Notes (Signed)
Subjective:     Patient ID: Gloria [name], female   DOB: [birth year], 65 y.o.   MRN: [id number]  HPI this patient returns to the office with continued pain noted in her right heel. She was diagnosed with plantar fasciitis and Achilles tendinitis of her right foot. She also has severe bunions big toe joints both feet. She was treated with injection therapy last visit and her heel is doing much better.  She also says the her big toenail right foot is thick and painful when walking.  She desires treatment of the nails.. Review of Systems     Objective:   Physical Exam GENERAL APPEARANCE: Alert, conversant. Appropriately groomed. No acute distress.  VASCULAR: Pedal pulses are  palpable at  [facility] and PT bilateral.  Capillary refill time is immediate to all digits,  Normal temperature gradient.   NEUROLOGIC: sensation is normal to 5.07 monofilament at 5/5 sites bilateral.  Light touch is intact bilateral, Muscle strength normal.  MUSCULOSKELETAL: acceptable muscle strength, tone and stability bilateral.  Intrinsic muscluature intact bilateral.  Rectus appearance of foot and digits noted bilateral. HAV 1st MPJ  B/L.  Pain has resolved  at insertion plantar fascia right foot. Pain has resolved  at insertion achilles tendon right foot.  DERMATOLOGIC: skin color, texture, and turgor are within normal limits.  No preulcerative lesions or ulcers  are seen, no interdigital maceration noted.  No open lesions present.   No drainage noted.  NAILS  Thick disfigured discolored nail right foot.      Assessment:     Plantar fascitis right foot   Onychomycosis      Plan:     ROV   Debridement of onychomycosis right hallux. Prescribe Formula 3.   RTC prn   Helane Gunther DPM

## 2016-08-31 ENCOUNTER — Other Ambulatory Visit: Payer: Self-pay | Admitting: Internal Medicine

## 2016-08-31 ENCOUNTER — Other Ambulatory Visit: Payer: Self-pay

## 2016-08-31 DIAGNOSIS — Z1231 Encounter for screening mammogram for malignant neoplasm of breast: Secondary | ICD-10-CM

## 2016-09-02 ENCOUNTER — Other Ambulatory Visit: Payer: Self-pay | Admitting: Internal Medicine

## 2016-09-05 ENCOUNTER — Other Ambulatory Visit: Payer: Self-pay | Admitting: Podiatry

## 2016-09-30 ENCOUNTER — Ambulatory Visit
Admission: RE | Admit: 2016-09-30 | Discharge: 2016-09-30 | Disposition: A | Discharge status: Home / Self Care | Payer: Medicare Other | Source: Ambulatory Visit

## 2016-09-30 DIAGNOSIS — Z1231 Encounter for screening mammogram for malignant neoplasm of breast: Secondary | ICD-10-CM

## 2016-11-15 ENCOUNTER — Other Ambulatory Visit: Payer: Self-pay | Admitting: Internal Medicine

## 2016-12-12 ENCOUNTER — Encounter (HOSPITAL_COMMUNITY): Payer: Self-pay

## 2016-12-12 ENCOUNTER — Emergency Department (HOSPITAL_COMMUNITY)
Admission: EM | Admit: 2016-12-12 | Discharge: 2016-12-12 | Disposition: A | Discharge status: Home / Self Care | Payer: Medicare Other | Attending: Emergency Medicine | Admitting: Emergency Medicine

## 2016-12-12 DIAGNOSIS — M545 Low back pain: Secondary | ICD-10-CM | POA: Diagnosis present

## 2016-12-12 DIAGNOSIS — Z7984 Long term (current) use of oral hypoglycemic drugs: Secondary | ICD-10-CM | POA: Diagnosis not present

## 2016-12-12 DIAGNOSIS — E039 Hypothyroidism, unspecified: Secondary | ICD-10-CM | POA: Insufficient documentation

## 2016-12-12 DIAGNOSIS — M5416 Radiculopathy, lumbar region: Secondary | ICD-10-CM | POA: Diagnosis not present

## 2016-12-12 DIAGNOSIS — I2510 Atherosclerotic heart disease of native coronary artery without angina pectoris: Secondary | ICD-10-CM | POA: Insufficient documentation

## 2016-12-12 DIAGNOSIS — Z79899 Other long term (current) drug therapy: Secondary | ICD-10-CM | POA: Diagnosis not present

## 2016-12-12 DIAGNOSIS — J0100 Acute maxillary sinusitis, unspecified: Secondary | ICD-10-CM | POA: Diagnosis not present

## 2016-12-12 DIAGNOSIS — E119 Type 2 diabetes mellitus without complications: Secondary | ICD-10-CM | POA: Insufficient documentation

## 2016-12-12 DIAGNOSIS — I10 Essential (primary) hypertension: Secondary | ICD-10-CM | POA: Diagnosis not present

## 2016-12-12 MED ORDER — AMOXICILLIN 500 MG PO CAPS: 1000.0000 mg | ORAL_CAPSULE | Freq: Two times a day (BID) | ORAL | 0 refills | Status: DC

## 2016-12-12 MED ORDER — IBUPROFEN 200 MG PO TABS
400.0000 mg | ORAL_TABLET | Freq: Once | ORAL | Status: AC
  Administered 2016-12-12: 400 mg via ORAL
  Filled 2016-12-12: qty 2

## 2016-12-12 MED ORDER — METHOCARBAMOL 500 MG PO TABS: ORAL_TABLET | ORAL | 0 refills | Status: DC

## 2016-12-12 MED ORDER — METHOCARBAMOL 500 MG PO TABS
750.0000 mg | ORAL_TABLET | Freq: Once | ORAL | Status: AC
  Administered 2016-12-12: 750 mg via ORAL
  Filled 2016-12-12: qty 2

## 2016-12-12 NOTE — Discharge Instructions (Signed)
You can use Flonase nasal spray which is avilable over the counter. Use nasal saline (you can try [employer] Simply Saline) at least 4 times a day, use saline 5-10 minutes before using the fluticasone (flonase) nasal spray  Do not use Afrin (Oxymetazoline)  Rest, wash hands frequently  and drink plenty of water.  You may try over the counter medication such as allegra-decongestant or claritin-decongestant and/or Mucinex or decongestants.  Push fluids to try to thin the mucus and get it to flow out the nose.  For pain control you may take up to 800mg  of Motrin (also known as ibuprofen). That is usually 4 over the counter pills,  3 times a day. Take with food to minimize stomach irritation    For breakthrough pain you may take Robaxin. Do not drink alcohol, drive or operate heavy machinery when taking Robaxin.

## 2016-12-12 NOTE — ED Provider Notes (Signed)
[facility]-EMERGENCY DEPT Provider Note    By signing my name below, I, Earmon Phoenix, attest that this documentation has been prepared under the direction and in the presence of [employer] , PA-C. Electronically Signed: Earmon Phoenix, ED Scribe. 12/12/16. 12:25 PM.    History   Chief Complaint Chief Complaint  Patient presents with  . Back Pain  . Leg Pain    Right    The history is provided by the patient and medical records. No language interpreter was used.  Leg Pain      HPI Comments:  Gloria [name] is a 66 y.o. female with PMHx T2DM, HLD, HTN, CAD, hypothyroidism and myasthenia gravis who presents to the Emergency Department complaining of new onset, right sided lower back pain that began two days ago. She states the pain radiates down her RLE. She states she has an appt with her PCP in 11 days and was trying to wait to be seen then. She reports taking Aleve and applying Voltaren gel with no significant relief of the pain. Moving around and walking help to alleviate the pain. She denies worsening factors. She states she was lifting heavy objects about one month ago and thinks that may have strained her back. She denies any trauma, injury or fall. She denies current fever, chills, nausea, vomiting, bowel or bladder incontinence or difficulty, numbness, tingling or weakness of the lower extremities. She denies h/o IV drug use or cancer. Her PCP is Dr. Cheryll Cockayne. She reports an allergy to Vicodin stating she has facial swelling.   Past Medical History:  Diagnosis Date  . Alopecia areata 10/2010 onset  . Diabetes mellitus, type 2 (HCC)   . Dyslipidemia   . Hypertension   . Hypothyroid   . Myasthenia gravis     Patient Active Problem List   Diagnosis Date Noted  . Plantar fasciitis of right foot 06/22/2016  . Leg weakness, bilateral 06/22/2016  . Carpal tunnel syndrome of right wrist 03/07/2013  . MYASTHENIA GRAVIS WITHOUT EXACERBATION 12/31/2010  . ALOPECIA  12/31/2010  . Hypothyroidism 12/29/2010  . Type 2 diabetes, diet controlled (HCC) 12/29/2010  . Hyperlipidemia 12/29/2010  . Essential hypertension 12/29/2010  . CORONARY ARTERY DISEASE 12/29/2010  . OSTEOARTHRITIS 12/29/2010    Past Surgical History:  Procedure Laterality Date  . ABDOMINAL HYSTERECTOMY  1990    OB History    No data available       Home Medications    Prior to Admission medications   Medication Sig Start Date End Date Taking? Authorizing Provider  amLODipine (NORVASC) 5 MG tablet Take 1 tablet (5 mg total) by mouth daily. 06/22/16   Pincus Sanes, MD  amLODipine (NORVASC) 5 MG tablet TAKE 1 TABLET BY MOUTH DAILY 11/15/16   Pincus Sanes, MD  amoxicillin (AMOXIL) 500 MG capsule Take 2 capsules (1,000 mg total) by mouth 2 (two) times daily. 12/12/16   Briley Sulton, PA-C  atorvastatin (LIPITOR) 10 MG tablet Take 1 tablet (10 mg total) by mouth daily. 06/22/16   Pincus Sanes, MD  levothyroxine (SYNTHROID, LEVOTHROID) 88 MCG tablet Take 1 tablet (88 mcg total) by mouth daily. 06/07/16   Pincus Sanes, MD  losartan (COZAAR) 100 MG tablet Take 1 tablet (100 mg total) by mouth daily. 06/22/16   Pincus Sanes, MD  meloxicam (MOBIC) 15 MG tablet Take 1 tablet (15 mg total) by mouth daily. 01/13/16   Helane Gunther, DPM  metFORMIN (GLUCOPHAGE-XR) 500 MG 24 hr tablet Take 1 tablet (  500 mg total) by mouth daily with breakfast. 09/21/15   Newt Lukes, MD  metFORMIN (GLUCOPHAGE-XR) 500 MG 24 hr tablet TAKE 1 TABLET(500 MG) BY MOUTH DAILY WITH BREAKFAST 09/02/16   Pincus Sanes, MD  methocarbamol (ROBAXIN) 500 MG tablet Can take up to 1-2 tabs every 6 hours PRN PAIN 12/12/16   Joni Reining Oney Folz, PA-C  pyridostigmine (MESTINON) 60 MG tablet Take 60 mg by mouth 3 (three) times daily. Per Neurologist     Historical Provider, MD  ST JOSEPH ASPIRIN PO Take 81 mg by mouth daily.    Historical Provider, MD  triamcinolone cream (KENALOG) 0.1 % APPLY TO THE AFFECTED AREA TWICE DAILY 08/19/14    Newt Lukes, MD    Family History Family History  Problem Relation Age of Onset  . Adopted: Yes    Social History Social History  Substance Use Topics  . Smoking status: Never Smoker  . Smokeless tobacco: Not on file  . Alcohol use No     Allergies   Vicodin [hydrocodone-acetaminophen]   Review of Systems Review of Systems A complete 10 system review of systems was obtained and all systems are negative except as noted in the HPI and PMH.    Physical Exam Updated Vital Signs BP 131/65 (BP Location: Left Arm)   Pulse (!) 55   Temp 98 F (36.7 C) (Oral)   Resp 16   Ht 5\' 6"  (1.676 m)   Wt 166 lb (75.3 kg)   SpO2 100%   BMI 26.79 kg/m   Physical Exam  Constitutional: She is oriented to person, place, and time. She appears well-developed and well-nourished.  HENT:  Head: Normocephalic and atraumatic.  Right Ear: External ear normal.  Left Ear: External ear normal.  Mouth/Throat: Oropharynx is clear and moist. No oropharyngeal exudate.  No drooling or stridor. Posterior pharynx mildly erythematous no significant tonsillar hypertrophy. No exudate. Soft palate rises symmetrically. No TTP or induration under tongue.   Positive tenderness palpation of bilateral maxillary sinuses   Bilateral tympanic membranes with normal architecture and good light reflex.    Eyes: Conjunctivae and EOM are normal. Pupils are equal, round, and reactive to light.  Neck: Normal range of motion. Neck supple.  Cardiovascular: Normal rate, regular rhythm and intact distal pulses.   Mild bradycardia in the 50s, regular  Pulmonary/Chest: Effort normal and breath sounds normal. No stridor. No respiratory distress. She has no wheezes. She has no rales. She exhibits no tenderness.  Abdominal: Soft. There is no tenderness. There is no rebound and no guarding.  Musculoskeletal: Normal range of motion.  Neurological: She is alert and oriented to person, place, and time.  No point  tenderness to percussion of lumbar spinal processes.  No TTP or paraspinal muscular spasm. Strength is 5 out of 5 to bilateral lower extremities at hip and knee; extensor hallucis longus 5 out of 5. Ankle strength 5 out of 5, no clonus, neurovascularly intact. No saddle anaesthesia. Patellar reflexes are 2+ bilaterally.    Right leg raise is positive on the ipsilateral (right) side at 30, positive on the contralateral side at around 50.   Skin: Skin is warm and dry.  Psychiatric: She has a normal mood and affect. Her behavior is normal.  Nursing note and vitals reviewed.    ED Treatments / Results  DIAGNOSTIC STUDIES: Oxygen Saturation is 100% on RA, normal by my interpretation.   COORDINATION OF CARE: 12:01 PM- Will prescribe muscle relaxer, Motrin and Voltaren gel. Will  give first doses here. Pt verbalizes understanding and agrees to plan.  12:23 PM- Just prior to discharge pt expresses concern for URI symptoms that began about 10 days ago. She reports yellow nasal congestion/discharge and mild frontal sinus pressure. She has been taking Coricidin with no significant relief of the symptoms.   Medications  methocarbamol (ROBAXIN) tablet 750 mg (750 mg Oral Given 12/12/16 1225)  ibuprofen (ADVIL,MOTRIN) tablet 400 mg (400 mg Oral Given 12/12/16 1226)    Labs (all labs ordered are listed, but only abnormal results are displayed) Labs Reviewed - No data to display  EKG  EKG Interpretation None       Radiology No results found.  Procedures Procedures (including critical care time)  Medications Ordered in ED Medications  methocarbamol (ROBAXIN) tablet 750 mg (750 mg Oral Given 12/12/16 1225)  ibuprofen (ADVIL,MOTRIN) tablet 400 mg (400 mg Oral Given 12/12/16 1226)     Initial Impression / Assessment and Plan / ED Course  I have reviewed the triage vital signs and the nursing notes.  Pertinent labs & imaging results that were available during my care of the patient were  reviewed by me and considered in my medical decision making (see chart for details).  Clinical Course    Vitals:   12/12/16 1115 12/12/16 1232  BP: 131/65   Pulse: (!) 55 (!) 51  Resp: 16   Temp: 98 F (36.7 C)   TempSrc: Oral   SpO2: 100% 100%  Weight: 75.3 kg   Height: 5\' 6"  (1.676 m)     Medications  methocarbamol (ROBAXIN) tablet 750 mg (750 mg Oral Given 12/12/16 1225)  ibuprofen (ADVIL,MOTRIN) tablet 400 mg (400 mg Oral Given 12/12/16 1226)    Gloria [name] is 66 y.o. female presenting with Low back pain consistent with radiculopathy, neurologic exam otherwise nonfocal, no signs of discitis, spinal epidural abscess, cord compression or cauda equina. Patient also has what appears to be a acute bacterial sinusitis. Will start on amoxicillin for that, asked to follow closely with PCP.  Evaluation does not show pathology that would require ongoing emergent intervention or inpatient treatment. Pt is hemodynamically stable and mentating appropriately. Discussed findings and plan with patient/guardian, who agrees with care plan. All questions answered. Return precautions discussed and outpatient follow up given.    I personally performed the services described in this documentation, which was scribed in my presence. The recorded information has been reviewed and is accurate.   Final Clinical Impressions(s) / ED Diagnoses   Final diagnoses:  Lumbar radiculopathy  Acute maxillary sinusitis, recurrence not specified    New Prescriptions Discharge Medication List as of 12/12/2016 12:26 PM    START taking these medications   Details  amoxicillin (AMOXIL) 500 MG capsule Take 2 capsules (1,000 mg total) by mouth 2 (two) times daily., Starting Mon 12/12/2016, [employer]         Archie Atilano, PA-C 12/12/16 1920    Laurence Spatesachel Morgan Little, MD 12/13/16 [phone]

## 2016-12-12 NOTE — ED Triage Notes (Addendum)
Pt arrives ambulatory to triage w/ c/o 10/10 lower back pain that radiates down her right leg. Pt states this started 2 days ago. Pt denies past injury or surgery. Pt denies recent fall. Pt denies relief after using OTC meds. Pt A+OX4, speaking in complete sentences. Pt denies dysuria.

## 2016-12-22 NOTE — Patient Instructions (Addendum)
  Ms. Elissa HeftyRicks , Thank you for taking time to come for your Medicare Wellness Visit. I appreciate your ongoing commitment to your health goals. Please review the following plan we discussed and let me know if I can assist you in the future.   These are the goals we discussed: Goals    None      This is a list of the screening recommended for you and due dates:  Health Maintenance  Topic Date Due  . Eye exam for diabetics  Will get report  . DEXA scan (bone density measurement)  ordered  . Pneumonia vaccines (1 of 2 - PCV13) 07/26/2016  . Hemoglobin A1C  12/23/2016  . Flu Shot  03/12/2017*  . Complete foot exam   06/15/2017  . Mammogram  09/30/2018  . Tetanus Vaccine  12/13/2019  . Colon Cancer Screening  09/04/2021  . Shingles Vaccine  Addressed  .  Hepatitis C: One time screening is recommended by Center for Disease Control  (CDC) for  adults born from [id number] through 1965.   Completed  . HIV Screening  Completed  *Topic was postponed. The date shown is not the original due date.      Test(s) ordered today. Your results will be released to MyChart (or called to you) after review, usually within 72hours after test completion. If any changes need to be made, you will be notified at that same time.  All other Health Maintenance issues reviewed.   All recommended immunizations and age-appropriate screenings are up-to-date or discussed.  No immunizations administered today.   Medications reviewed and updated.  No changes recommended at this time.  Your prescription(s) have been submitted to your pharmacy. Please take as directed and contact our office if you believe you are having problem(s) with the medication(s).   Please followup in 6 months

## 2016-12-22 NOTE — Progress Notes (Signed)
Subjective:    Patient ID: Gloria [name], female    DOB: Jul 08, 1951, 66 y.o.   MRN: [id number]  HPI Here for medicare initial wellness exam and follow up of chronic medical problems.   CAD, Hypertension: She is taking her medication daily. She is compliant with a low sodium diet.  She denies chest pain ( except her left lower ribs), palpitations, edema, shortness of breath and regular headaches. She is not exercising regularly right now, but typically does - she was not feeling well and has just started back.      Diabetes: She is taking all of her medication as prescribed. She is compliant with a diabetic diet. She is exercising. She checks her feet daily and denies foot lesions. She is up-to-date with an ophthalmology examination.   Hyperlipidemia: She is taking her medication daily. She is compliant with a low fat/cholesterol diet. She is exercising. She denies myalgias.   Hypothyroidism:  She is taking her medication daily.  She denies any recent changes in energy or weight that are unexplained.   She went to the ED 12/12/16 for lumbar radiculopathy and was prescribed a muscle relaxer which helped.  She was also prescribed amoxicillin or a URI.  She feels better. She still has a nagging pain in her right lower back.    She has pain in her left lower chest, under her breast only when she bends over.  This is new. She denies trauma to the other.  I have personally reviewed and have noted 1.The patient's medical and social history 2.Their use of alcohol, tobacco or illicit drugs 3.Their current medications and supplements 4.The patient's functional ability including ADL's, fall risks, home safety risks and hearing or visual impairment. 5.Diet and physical activities 6.Evidence for depression or mood disorders 7.Care team reviewed  - Podiatry - Dr Stacie Acres, Neurology - Dr Alphonzo Dublin, Eye - Dr Abel Presto   Are there smokers in your  home (other than you)? No  Risk Factors Exercise: yes, started back after taking a break when she was not feeling well Dietary issues discussed: well balanced, tries to avoid sugars, needs to increase fruits/veges  Cardiac risk factors: advanced age, hypertension, hyperlipidemia, diabetes  Depression Screen  Have you felt down, depressed or hopeless? No  Have you felt little interest or pleasure in doing things?  No  Activities of Daily Living In your present state of health, do you have any difficulty performing the following activities?:  Driving? No Managing money?  No Feeding yourself? No Getting from bed to chair? No Climbing a flight of stairs? No Preparing food and eating?: No Bathing or showering? No Getting dressed: No Getting to/using the toilet? No Moving around from place to place: No In the past year have you fallen or had a near fall?: No   Are you sexually active?  No  Do you have more than one partner?  N/A  Hearing Difficulties: No Do you often ask people to speak up or repeat themselves? No Do you experience ringing or noises in your ears? No Do you have difficulty understanding soft or whispered voices? No Vision:              Any change in vision:  No              Up to date with eye exam:  yes Memory:  Do you feel that you have a problem with memory? No.  Forgets why she goes into a room  Do you  often misplace items? Yes- infrequently if not thinking about what she is doing  Do you feel safe at home?  Yes  Cognitive Testing  Alert, Orientated? Yes  Normal Appearance? Yes  Recall of three objects?  Yes  Can perform simple calculations? Yes  Displays appropriate judgment? Yes  Can read the correct time from a watch face? Yes   Advanced Directives have been discussed with the patient? Yes, in place   Medications and allergies reviewed with patient and updated if appropriate.  Patient Active Problem List   Diagnosis Date Noted  . Lumbar  radiculopathy 12/23/2016  . Plantar fasciitis of right foot 06/22/2016  . Leg weakness, bilateral 06/22/2016  . Carpal tunnel syndrome of right wrist 03/07/2013  . MYASTHENIA GRAVIS WITHOUT EXACERBATION 12/31/2010  . ALOPECIA 12/31/2010  . Hypothyroidism 12/29/2010  . Type 2 diabetes, diet controlled (HCC) 12/29/2010  . Hyperlipidemia 12/29/2010  . Essential hypertension 12/29/2010  . CORONARY ARTERY DISEASE 12/29/2010  . OSTEOARTHRITIS 12/29/2010    Current Outpatient Prescriptions on File Prior to Visit  Medication Sig Dispense Refill  . amLODipine (NORVASC) 5 MG tablet TAKE 1 TABLET BY MOUTH DAILY 90 tablet 1  . atorvastatin (LIPITOR) 10 MG tablet Take 1 tablet (10 mg total) by mouth daily. 90 tablet 1  . levothyroxine (SYNTHROID, LEVOTHROID) 88 MCG tablet Take 1 tablet (88 mcg total) by mouth daily. 90 tablet 2  . losartan (COZAAR) 100 MG tablet Take 1 tablet (100 mg total) by mouth daily. 90 tablet 3  . metFORMIN (GLUCOPHAGE-XR) 500 MG 24 hr tablet TAKE 1 TABLET(500 MG) BY MOUTH DAILY WITH BREAKFAST 90 tablet 1  . pyridostigmine (MESTINON) 60 MG tablet Take 60 mg by mouth 3 (three) times daily. Per Neurologist     . ST JOSEPH ASPIRIN PO Take 81 mg by mouth daily.     No current facility-administered medications on file prior to visit.     Past Medical History:  Diagnosis Date  . Alopecia areata 10/2010 onset  . Diabetes mellitus, type 2 (HCC)   . Dyslipidemia   . Hypertension   . Hypothyroid   . Myasthenia gravis     Past Surgical History:  Procedure Laterality Date  . ABDOMINAL HYSTERECTOMY  1990    Social History   Social History  . Marital status: Divorced    Spouse name: N/A  . Number of children: N/A  . Years of education: N/A   Social History Main Topics  . Smoking status: Never Smoker  . Smokeless tobacco: None  . Alcohol use No  . Drug use: Unknown  . Sexual activity: Not Asked   Other Topics Concern  . None   Social History Narrative  . None      Family History  Problem Relation Age of Onset  . Adopted: Yes    Review of Systems  Constitutional: Negative for appetite change, chills, fatigue, fever and unexpected weight change.  HENT: Positive for congestion. Negative for hearing loss and tinnitus.   Eyes: Negative for visual disturbance.  Respiratory: Negative for cough, shortness of breath and wheezing.   Cardiovascular: Positive for chest pain (left lower chest/ribs when she bends over). Negative for palpitations and leg swelling.  Gastrointestinal: Negative for abdominal pain, blood in stool, constipation, diarrhea and nausea.       No gerd  Genitourinary: Negative for dysuria and hematuria.  Musculoskeletal: Positive for back pain (right lower back). Negative for arthralgias.  Skin: Negative for color change and rash.  Neurological: Positive for  headaches (mild, occ). Negative for dizziness and light-headedness.  Psychiatric/Behavioral: Negative for dysphoric mood. The patient is not nervous/anxious.        Objective:   Vitals:   12/23/16 1104  BP: 124/86  Pulse: (!) 56  Resp: 16  Temp: 98.6 F (37 C)   Filed Weights   12/23/16 1104  Weight: 168 lb (76.2 kg)   Body mass index is 27.12 kg/m.  Wt Readings from Last 3 Encounters:  12/23/16 168 lb (76.2 kg)  12/12/16 166 lb (75.3 kg)  06/22/16 167 lb (75.8 kg)     Physical Exam Constitutional: She appears well-developed and well-nourished. No distress.  HENT:  Head: Normocephalic and atraumatic.  Right Ear: External ear normal. Normal ear canal and TM Left Ear: External ear normal.  Normal ear canal and TM Mouth/Throat: Oropharynx is clear and moist.  Eyes: Conjunctivae and EOM are normal.  Neck: Neck supple. No tracheal deviation present. No thyromegaly present.  No carotid bruit  Cardiovascular: Normal rate, regular rhythm and normal heart sounds.   No murmur heard.  No edema. Pulmonary/Chest: Effort normal and breath sounds normal. No  respiratory distress. She has no wheezes. She has no rales.  Breast: deferred  Abdominal: Soft. She exhibits no distension. There is no tenderness.  Lymphadenopathy: She has no cervical adenopathy.  Skin: Skin is warm and dry. She is not diaphoretic.  Psychiatric: She has a normal mood and affect. Her behavior is normal.         Assessment & Plan:   Wellness Exam: Immunizations - discussed prevnar - deferred today Colonoscopy  Up to date  Mammogram   Up to date  Dexa   -  Ordered today - never had one Gyn - no longer sees gyn Eye exam  Up to date  Hearing loss   none Memory concerns/difficulties  - none Independent of ADLs  fully Stressed the importance of regular exercise   Patient received copy of preventative screening tests/immunizations recommended for the next 5-10 years.   See Problem List for Assessment and Plan of chronic medical problems.   FU in 6 months

## 2016-12-22 NOTE — Assessment & Plan Note (Signed)
Check tsh  Titrate med dose if needed  

## 2016-12-23 ENCOUNTER — Encounter: Payer: Self-pay | Admitting: Internal Medicine

## 2016-12-23 ENCOUNTER — Other Ambulatory Visit (INDEPENDENT_AMBULATORY_CARE_PROVIDER_SITE_OTHER): Payer: Medicare Other

## 2016-12-23 ENCOUNTER — Ambulatory Visit (INDEPENDENT_AMBULATORY_CARE_PROVIDER_SITE_OTHER): Payer: Medicare Other | Admitting: Internal Medicine

## 2016-12-23 VITALS — BP 124/86 | HR 56 | Temp 98.6°F | Resp 16 | Wt 168.0 lb

## 2016-12-23 DIAGNOSIS — E119 Type 2 diabetes mellitus without complications: Secondary | ICD-10-CM

## 2016-12-23 DIAGNOSIS — G7000 Myasthenia gravis without (acute) exacerbation: Secondary | ICD-10-CM

## 2016-12-23 DIAGNOSIS — I10 Essential (primary) hypertension: Secondary | ICD-10-CM

## 2016-12-23 DIAGNOSIS — E7800 Pure hypercholesterolemia, unspecified: Secondary | ICD-10-CM | POA: Diagnosis not present

## 2016-12-23 DIAGNOSIS — I2510 Atherosclerotic heart disease of native coronary artery without angina pectoris: Secondary | ICD-10-CM

## 2016-12-23 DIAGNOSIS — E039 Hypothyroidism, unspecified: Secondary | ICD-10-CM | POA: Diagnosis not present

## 2016-12-23 DIAGNOSIS — M5416 Radiculopathy, lumbar region: Secondary | ICD-10-CM

## 2016-12-23 DIAGNOSIS — Z13820 Encounter for screening for osteoporosis: Secondary | ICD-10-CM

## 2016-12-23 DIAGNOSIS — Z0000 Encounter for general adult medical examination without abnormal findings: Secondary | ICD-10-CM | POA: Diagnosis not present

## 2016-12-23 DIAGNOSIS — E2839 Other primary ovarian failure: Secondary | ICD-10-CM

## 2016-12-23 LAB — CBC WITH DIFFERENTIAL/PLATELET
Basophils Absolute: 0 10*3/uL (ref 0.0–0.1)
Basophils Relative: 0.4 % (ref 0.0–3.0)
Eosinophils Absolute: 0.7 10*3/uL (ref 0.0–0.7)
Eosinophils Relative: 11.9 % — ABNORMAL HIGH (ref 0.0–5.0)
HCT: 36.7 % (ref 36.0–46.0)
Hemoglobin: 12.6 g/dL (ref 12.0–15.0)
Lymphocytes Relative: 26.6 % (ref 12.0–46.0)
Lymphs Abs: 1.5 10*3/uL (ref 0.7–4.0)
MCHC: 34.4 g/dL (ref 30.0–36.0)
MCV: 88.3 fl (ref 78.0–100.0)
Monocytes Absolute: 0.6 10*3/uL (ref 0.1–1.0)
Monocytes Relative: 9.8 % (ref 3.0–12.0)
Neutro Abs: 2.9 10*3/uL (ref 1.4–7.7)
Neutrophils Relative %: 51.3 % (ref 43.0–77.0)
Platelets: 252 10*3/uL (ref 150.0–400.0)
RBC: 4.16 Mil/uL (ref 3.87–5.11)
RDW: 13.5 % (ref 11.5–15.5)
WBC: 5.7 10*3/uL (ref 4.0–10.5)

## 2016-12-23 LAB — COMPREHENSIVE METABOLIC PANEL
ALT: 10 U/L (ref 0–35)
AST: 17 U/L (ref 0–37)
Albumin: 4.1 g/dL (ref 3.5–5.2)
Alkaline Phosphatase: 70 U/L (ref 39–117)
BUN: 10 mg/dL (ref 6–23)
CO2: 32 mEq/L (ref 19–32)
Calcium: 9.4 mg/dL (ref 8.4–10.5)
Chloride: 105 mEq/L (ref 96–112)
Creatinine, Ser: 0.85 mg/dL (ref 0.40–1.20)
GFR: 86.21 mL/min (ref >60.00)
Glucose, Bld: 105 mg/dL — ABNORMAL HIGH (ref 70–99)
Potassium: 4.4 mEq/L (ref 3.5–5.1)
Sodium: 140 mEq/L (ref 135–145)
Total Bilirubin: 0.4 mg/dL (ref 0.2–1.2)
Total Protein: 7.4 g/dL (ref 6.0–8.3)

## 2016-12-23 LAB — LIPID PANEL
Cholesterol: 162 mg/dL (ref 0–200)
HDL: 48.7 mg/dL (ref >39.00)
LDL Cholesterol: 96 mg/dL (ref 0–99)
NonHDL: 113.41
Total CHOL/HDL Ratio: 3
Triglycerides: 89 mg/dL (ref 0.0–149.0)
VLDL: 17.8 mg/dL (ref 0.0–40.0)

## 2016-12-23 LAB — TSH: TSH: 0.78 u[IU]/mL (ref 0.35–4.50)

## 2016-12-23 LAB — MICROALBUMIN / CREATININE URINE RATIO
Creatinine,U: 108.7 mg/dL
Microalb Creat Ratio: 1.8 mg/g (ref 0.0–30.0)
Microalb, Ur: 2 mg/dL — ABNORMAL HIGH (ref 0.0–1.9)

## 2016-12-23 LAB — HEMOGLOBIN A1C: Hgb A1c MFr Bld: 5.8 % (ref 4.6–6.5)

## 2016-12-23 MED ORDER — TRIAMCINOLONE ACETONIDE 0.1 % EX CREA: TOPICAL_CREAM | Freq: Two times a day (BID) | CUTANEOUS | 0 refills | Status: DC

## 2016-12-23 NOTE — Assessment & Plan Note (Signed)
Check a1c, urine micro Continue metformin Continue regular exercise  Eye exam up to date

## 2016-12-23 NOTE — Assessment & Plan Note (Signed)
Will follow up with Dr Alphonzo Dublinaress

## 2016-12-23 NOTE — Assessment & Plan Note (Signed)
Check lipid panel  Continue daily statin Regular exercise and healthy diet encouraged  

## 2016-12-23 NOTE — Assessment & Plan Note (Signed)
BP well controlled Current regimen effective and well tolerated Continue current medications at current doses cmp  

## 2016-12-23 NOTE — Assessment & Plan Note (Signed)
Symptoms resolved, minor right lower back pain

## 2016-12-23 NOTE — Assessment & Plan Note (Signed)
No chest pain  Exercising regularly Continue ASA ,statin, BP meds

## 2016-12-23 NOTE — Progress Notes (Signed)
Pre visit review using our clinic review tool, if applicable. No additional management support is needed unless otherwise documented below in the visit note. 

## 2016-12-27 ENCOUNTER — Other Ambulatory Visit: Payer: Self-pay | Admitting: Internal Medicine

## 2016-12-29 ENCOUNTER — Other Ambulatory Visit: Payer: Self-pay | Admitting: Internal Medicine

## 2017-02-01 ENCOUNTER — Ambulatory Visit (INDEPENDENT_AMBULATORY_CARE_PROVIDER_SITE_OTHER)
Admission: RE | Admit: 2017-02-01 | Discharge: 2017-02-01 | Disposition: A | Discharge status: Home / Self Care | Payer: Medicare Other | Source: Ambulatory Visit | Attending: Internal Medicine | Admitting: Internal Medicine

## 2017-02-01 ENCOUNTER — Other Ambulatory Visit: Payer: Medicare Other

## 2017-02-01 ENCOUNTER — Inpatient Hospital Stay: Admission: RE | Admit: 2017-02-01 | Payer: Medicare Other | Source: Ambulatory Visit

## 2017-02-01 DIAGNOSIS — Z13820 Encounter for screening for osteoporosis: Secondary | ICD-10-CM

## 2017-02-01 DIAGNOSIS — E2839 Other primary ovarian failure: Secondary | ICD-10-CM

## 2017-03-13 ENCOUNTER — Other Ambulatory Visit: Payer: Self-pay | Admitting: Internal Medicine

## 2017-03-15 ENCOUNTER — Other Ambulatory Visit: Payer: Self-pay | Admitting: Internal Medicine

## 2017-06-29 LAB — HM DIABETES EYE EXAM

## 2017-07-23 NOTE — Patient Instructions (Addendum)
  Test(s) ordered today. Your results will be released to MyChart (or called to you) after review, usually within 72hours after test completion. If any changes need to be made, you will be notified at that same time.  All other Health Maintenance issues reviewed.   All recommended immunizations and age-appropriate screenings are up-to-date or discussed.  No immunizations administered today.   Medications reviewed and updated.  No changes recommended at this time for your chronic medications.  An antibiotic was sent to your pharmacy for your cold.  Your prescription(s) have been submitted to your pharmacy. Please take as directed and contact our office if you believe you are having problem(s) with the medication(s).   Please followup in 6 months

## 2017-07-23 NOTE — Progress Notes (Signed)
Subjective:    Patient ID: Gloria SearingCynthia L Wadlow, female    DOB: 10-Jan-1951, 66 y.o.   MRN: [id number]  HPI The patient is here for follow up.  Cold symptoms:  It started about one week ago.  She is taking otc medications.  She has had subjective fevers, nasal congestion, ear pain and sinus pressure, chest congestion, cough, wheeze, mild chest tightness and dizziness.   Diabetes: She stopped her metformin because her sugars after her last visit were so good. She is compliant with a diabetic diet. She is exercising regularly. She checks her feet daily and denies foot lesions. She is up-to-date with an ophthalmology examination.   Hypertension: She is taking her medication daily. She is compliant with a low sodium diet.  She denies chest pain, palpitations, edema, shortness of breath and regular headaches. She is exercising regularly.  She does not monitor her blood pressure at home.    Hypothyroidism:  She is taking her medication daily.  She denies any recent changes in energy or weight that are unexplained.   Hyperlipidemia: She is taking her medication daily. She is compliant with a low fat/cholesterol diet. She is exercising regularly. She denies myalgias.     Medications and allergies reviewed with patient and updated if appropriate.  Patient Active Problem List   Diagnosis Date Noted  . Lumbar radiculopathy 12/23/2016  . Plantar fasciitis of right foot 06/22/2016  . Leg weakness, bilateral 06/22/2016  . Carpal tunnel syndrome of right wrist 03/07/2013  . Myasthenia gravis without exacerbation (HCC) 12/31/2010  . ALOPECIA 12/31/2010  . Hypothyroidism 12/29/2010  . Type 2 diabetes, diet controlled (HCC) 12/29/2010  . Hyperlipidemia 12/29/2010  . Essential hypertension 12/29/2010  . Coronary atherosclerosis 12/29/2010  . OSTEOARTHRITIS 12/29/2010    Current Outpatient Prescriptions on File Prior to Visit  Medication Sig Dispense Refill  . amLODipine (NORVASC) 5 MG tablet TAKE 1  TABLET BY MOUTH DAILY 90 tablet 1  . atorvastatin (LIPITOR) 10 MG tablet TAKE 1 TABLET(10 MG) BY MOUTH DAILY 90 tablet 2  . levothyroxine (SYNTHROID, LEVOTHROID) 88 MCG tablet TAKE 1 TABLET(88 MCG) BY MOUTH DAILY 90 tablet 1  . losartan (COZAAR) 100 MG tablet TAKE 1 TABLET BY MOUTH EVERY DAY 90 tablet 3  . pyridostigmine (MESTINON) 60 MG tablet Take 60 mg by mouth 3 (three) times daily. Per Neurologist     . ST JOSEPH ASPIRIN PO Take 81 mg by mouth daily.    Marland Kitchen. triamcinolone cream (KENALOG) 0.1 % Apply topically 2 (two) times daily. to affected area 30 g 0  . VOLTAREN 1 % GEL      No current facility-administered medications on file prior to visit.     Past Medical History:  Diagnosis Date  . Alopecia areata 10/2010 onset  . Diabetes mellitus, type 2 (HCC)   . Dyslipidemia   . Hypertension   . Hypothyroid   . Myasthenia gravis     Past Surgical History:  Procedure Laterality Date  . ABDOMINAL HYSTERECTOMY  1990    Social History   Social History  . Marital status: Divorced    Spouse name: N/A  . Number of children: N/A  . Years of education: N/A   Social History Main Topics  . Smoking status: Never Smoker  . Smokeless tobacco: Never Used  . Alcohol use No  . Drug use: Unknown  . Sexual activity: Not on file   Other Topics Concern  . Not on file   Social History Narrative  .  No narrative on file    Family History  Problem Relation Age of Onset  . Adopted: Yes    Review of Systems  Constitutional: Positive for fever (subjective). Negative for chills.  HENT: Positive for congestion, ear pain (mild) and sinus pressure. Negative for sore throat.   Respiratory: Positive for cough (productive at times), chest tightness and wheezing. Negative for shortness of breath.   Cardiovascular: Negative for chest pain, palpitations and leg swelling.  Neurological: Positive for dizziness (with cold) and numbness (in finger tips when she gets cold). Negative for light-headedness  and headaches.       Objective:   Vitals:   07/24/17 1619  BP: 124/80  Pulse: (!) 59  Resp: 16  Temp: 98.1 F (36.7 C)  SpO2: 98%   Wt Readings from Last 3 Encounters:  07/24/17 164 lb (74.4 kg)  12/23/16 168 lb (76.2 kg)  12/12/16 166 lb (75.3 kg)   Body mass index is 26.47 kg/m.   Physical Exam    Constitutional: Appears well-developed and well-nourished. No distress.  HENT:  Head: Normocephalic and atraumatic.  Neck: Bilateral ear canals and tympanic membranes normal. No oropharynx erythema. Neck supple. No tracheal deviation present. No thyromegaly present.  No cervical lymphadenopathy Cardiovascular: Normal rate, regular rhythm and normal heart sounds.   No murmur heard. No carotid bruit .  No edema Pulmonary/Chest: Effort normal.  No respiratory distress. Expiratory wheezes heard. No rales.  Skin: Skin is warm and dry. Not diaphoretic.  Psychiatric: Normal mood and affect. Behavior is normal.      Assessment & Plan:    See Problem List for Assessment and Plan of chronic medical problems.    FU in 6 months

## 2017-07-24 ENCOUNTER — Ambulatory Visit (INDEPENDENT_AMBULATORY_CARE_PROVIDER_SITE_OTHER): Payer: Medicare Other | Admitting: Internal Medicine

## 2017-07-24 ENCOUNTER — Encounter: Payer: Self-pay | Admitting: Internal Medicine

## 2017-07-24 ENCOUNTER — Other Ambulatory Visit (INDEPENDENT_AMBULATORY_CARE_PROVIDER_SITE_OTHER): Payer: Medicare Other

## 2017-07-24 VITALS — BP 124/80 | HR 59 | Temp 98.1°F | Resp 16 | Wt 164.0 lb

## 2017-07-24 DIAGNOSIS — E785 Hyperlipidemia, unspecified: Secondary | ICD-10-CM | POA: Diagnosis not present

## 2017-07-24 DIAGNOSIS — J209 Acute bronchitis, unspecified: Secondary | ICD-10-CM | POA: Diagnosis not present

## 2017-07-24 DIAGNOSIS — E039 Hypothyroidism, unspecified: Secondary | ICD-10-CM

## 2017-07-24 DIAGNOSIS — E7800 Pure hypercholesterolemia, unspecified: Secondary | ICD-10-CM

## 2017-07-24 DIAGNOSIS — I10 Essential (primary) hypertension: Secondary | ICD-10-CM

## 2017-07-24 DIAGNOSIS — E119 Type 2 diabetes mellitus without complications: Secondary | ICD-10-CM | POA: Diagnosis not present

## 2017-07-24 DIAGNOSIS — I2510 Atherosclerotic heart disease of native coronary artery without angina pectoris: Secondary | ICD-10-CM | POA: Diagnosis not present

## 2017-07-24 LAB — COMPREHENSIVE METABOLIC PANEL
ALT: 15 U/L (ref 0–35)
AST: 21 U/L (ref 0–37)
Albumin: 4.5 g/dL (ref 3.5–5.2)
Alkaline Phosphatase: 67 U/L (ref 39–117)
BUN: 15 mg/dL (ref 6–23)
CO2: 28 mEq/L (ref 19–32)
Calcium: 9.5 mg/dL (ref 8.4–10.5)
Chloride: 103 mEq/L (ref 96–112)
Creatinine, Ser: 0.98 mg/dL (ref 0.40–1.20)
GFR: 73.02 mL/min (ref >60.00)
Glucose, Bld: 89 mg/dL (ref 70–99)
Potassium: 4 mEq/L (ref 3.5–5.1)
Sodium: 138 mEq/L (ref 135–145)
Total Bilirubin: 0.5 mg/dL (ref 0.2–1.2)
Total Protein: 7.5 g/dL (ref 6.0–8.3)

## 2017-07-24 LAB — HEMOGLOBIN A1C: Hgb A1c MFr Bld: 5.5 % (ref 4.6–6.5)

## 2017-07-24 LAB — TSH: TSH: 3.63 u[IU]/mL (ref 0.35–4.50)

## 2017-07-24 MED ORDER — CEPHALEXIN 500 MG PO CAPS: 500.0000 mg | ORAL_CAPSULE | Freq: Three times a day (TID) | ORAL | 0 refills | Status: DC

## 2017-07-24 NOTE — Assessment & Plan Note (Signed)
Cholesterol has been well controlled Continue statin Continue healthy diet and regular exercise

## 2017-07-24 NOTE — Assessment & Plan Note (Signed)
Check tsh  Titrate med dose if needed  

## 2017-07-24 NOTE — Assessment & Plan Note (Signed)
Symptoms and exam consistent with acute bacterial bronchitis We'll start Keflex 3 times a day Antibiotic will likely improve her wheezing, but if there is no improvement and consider starting an inhaler Continue over-the-counter cold medications Call if no improvement

## 2017-07-24 NOTE — Assessment & Plan Note (Addendum)
Stopped metformin - last a1c 5.8% Will check a1c - ? Need to restart metformin Continue regular exercise Last eye exam July 2018-we did not receive a report

## 2017-07-24 NOTE — Assessment & Plan Note (Signed)
BP well controlled Current regimen effective and well tolerated Continue current medications at current doses CMP 

## 2017-07-24 NOTE — Assessment & Plan Note (Signed)
No chest pain, shortness of breath or palpitations Continue aspirin, statin Continue regular exercise

## 2017-08-08 ENCOUNTER — Other Ambulatory Visit: Payer: Self-pay | Admitting: Internal Medicine

## 2017-08-08 ENCOUNTER — Telehealth: Payer: Self-pay | Admitting: Internal Medicine

## 2017-08-08 MED ORDER — TRIAMCINOLONE ACETONIDE 0.1 % EX CREA: TOPICAL_CREAM | Freq: Two times a day (BID) | CUTANEOUS | 0 refills | Status: DC

## 2017-08-08 NOTE — Telephone Encounter (Signed)
Pt called for a refill of her triamcinolone cream (KENALOG) 0.1 % Had CPE 12/2016 Please advise

## 2017-08-08 NOTE — Telephone Encounter (Signed)
Notified pt rx sent to [facility].../lmb 

## 2017-09-07 ENCOUNTER — Other Ambulatory Visit: Payer: Self-pay | Admitting: Internal Medicine

## 2017-09-08 ENCOUNTER — Ambulatory Visit (INDEPENDENT_AMBULATORY_CARE_PROVIDER_SITE_OTHER): Payer: Medicare Other | Admitting: Podiatry

## 2017-09-08 DIAGNOSIS — B351 Tinea unguium: Secondary | ICD-10-CM

## 2017-09-08 DIAGNOSIS — M79676 Pain in unspecified toe(s): Secondary | ICD-10-CM

## 2017-09-08 DIAGNOSIS — M2010 Hallux valgus (acquired), unspecified foot: Secondary | ICD-10-CM

## 2017-09-08 DIAGNOSIS — M722 Plantar fascial fibromatosis: Secondary | ICD-10-CM

## 2017-09-08 NOTE — Progress Notes (Signed)
This patient presents the office with chief complaint of pain in her big toe, right foot.  She says that her toenail has grown thick and long and is lifting up.  She says this is painful walking and wearing her shoes.  She also says that she has plantar fasciitis on her right foot, which was treated successfully with plantar fascial brace.  She requests a new brace.  She also says that her left big toe has improved with the application of formula 3 and requests additional formula 3 for her right big toe.  She presents the office for these multiple concerns.   General Appearance  Alert, conversant and in no acute stress.  Vascular  Dorsalis pedis and posterior pulses are palpable  bilaterally.  Capillary return is within normal limits  Bilaterally. Temperature is within normal limits  Bilaterally  Neurologic  Senn-Weinstein monofilament wire test within normal limits  bilaterally. Muscle power  Within normal limits bilaterally.  Nails Thick disfigured discolored nails with subungual debris noted right hallux.  This nail has grown away from the nail bed.   No evidence of bacterial infection or drainage bilaterally.  Orthopedic  No limitations of motion of motion feet bilaterally.  No crepitus or effusions noted.  HAV first MPJ bilaterally.  No palpable pain along the plantar fascia right foot.  Skin  normotropic skin with no porokeratosis noted bilaterally.  No signs of infections or ulcers noted.    Onychomycosis  plantar fasciitis     ROV  dispensed a plantar fascial brace.  Prescribed formula 3.  Debridement of the right hallux toenail was performed.  RTC prn   Helane Gunther DPM

## 2017-09-25 ENCOUNTER — Other Ambulatory Visit: Payer: Self-pay | Admitting: Internal Medicine

## 2017-09-25 DIAGNOSIS — Z1231 Encounter for screening mammogram for malignant neoplasm of breast: Secondary | ICD-10-CM

## 2017-10-11 ENCOUNTER — Ambulatory Visit
Admission: RE | Admit: 2017-10-11 | Discharge: 2017-10-11 | Disposition: A | Discharge status: Home / Self Care | Payer: Medicare Other | Source: Ambulatory Visit | Attending: Internal Medicine | Admitting: Internal Medicine

## 2017-10-11 DIAGNOSIS — Z1231 Encounter for screening mammogram for malignant neoplasm of breast: Secondary | ICD-10-CM

## 2017-11-16 ENCOUNTER — Other Ambulatory Visit: Payer: Self-pay

## 2017-12-04 ENCOUNTER — Other Ambulatory Visit: Payer: Self-pay | Admitting: Internal Medicine

## 2017-12-19 ENCOUNTER — Other Ambulatory Visit: Payer: Self-pay | Admitting: Internal Medicine

## 2018-01-23 NOTE — Progress Notes (Signed)
Subjective:    Patient ID: Gloria [name], female    DOB: 06-20-1951, 67 y.o.   MRN: [id number]  HPI The patient is here for follow up.  CAD, Hypertension: She is taking her medication daily. She is compliant with a low sodium diet.  She denies chest pain, palpitations, edema, shortness of breath and regular headaches. She is exercising regularly -  At least 2-3 times a week.      Diabetes: She is  controlling her sugars without medication she was on metformin at one point, but when her A1c was very well controlled we discontinued it. She is compliant with a diabetic diet. She is exercising regularly. She does not monitor her sugars. She checks her feet daily and denies foot lesions. She is up-to-date with an ophthalmology examination.   Hypothyroidism:  She is taking her medication daily.  She denies any recent changes in energy or weight that are unexplained.   Hyperlipidemia: She is taking her medication daily. She is compliant with a low fat/cholesterol diet. She is exercising regularly. She denies myalgias.   Right hand fingers are numb and left index finger numb.  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  The numbness started 3-4 weeks ago.  She is concerned of the carpal tunnel has recurred.  She would like to see the same doctor that did the surgery.  Her hair is very thin.  She has been diagnosed with alopecia.  She wondered about taking vitamins.  She has seen derm.   Medications and allergies reviewed with patient and updated if appropriate.  Patient Active Problem List   Diagnosis Date Noted  . Lumbar radiculopathy 12/23/2016  . Plantar fasciitis of right foot 06/22/2016  . Leg weakness, bilateral 06/22/2016  . Carpal tunnel syndrome of right wrist 03/07/2013  . Myasthenia gravis without exacerbation (HCC) 12/31/2010  . ALOPECIA 12/31/2010  . Hypothyroidism 12/29/2010  . Type 2 diabetes, diet controlled (HCC) 12/29/2010  . Hyperlipidemia  12/29/2010  . Essential hypertension 12/29/2010  . Coronary atherosclerosis 12/29/2010  . OSTEOARTHRITIS 12/29/2010    Current Outpatient Medications on File Prior to Visit  Medication Sig Dispense Refill  . amLODipine (NORVASC) 5 MG tablet TAKE 1 TABLET(5 MG) BY MOUTH DAILY 90 tablet 1  . atorvastatin (LIPITOR) 10 MG tablet TAKE 1 TABLET BY MOUTH DAILY AT 6PM 90 tablet 1  . levothyroxine (SYNTHROID, LEVOTHROID) 88 MCG tablet TAKE 1 TABLET(88 MCG) BY MOUTH DAILY 90 tablet 1  . losartan (COZAAR) 100 MG tablet TAKE 1 TABLET(100 MG) BY MOUTH DAILY 90 tablet 1  . pyridostigmine (MESTINON) 60 MG tablet Take 60 mg by mouth 3 (three) times daily. Per Neurologist     . ST JOSEPH ASPIRIN PO Take 81 mg by mouth daily.    Marland Kitchen triamcinolone cream (KENALOG) 0.1 % Apply topically 2 (two) times daily. to affected area 30 g 0  . VOLTAREN 1 % GEL      No current facility-administered medications on file prior to visit.     Past Medical History:  Diagnosis Date  . Alopecia areata 10/2010 onset  . Diabetes mellitus, type 2 (HCC)   . Dyslipidemia   . Hypertension   . Hypothyroid   . Myasthenia gravis     Past Surgical History:  Procedure Laterality Date  . ABDOMINAL HYSTERECTOMY  1990    Social History   Socioeconomic History  . Marital status: Divorced    Spouse name: None  . Number  of children: None  . Years of education: None  . Highest education level: None  Social Needs  . Financial resource strain: None  . Food insecurity - worry: None  . Food insecurity - inability: None  . Transportation needs - medical: None  . Transportation needs - non-medical: None  Occupational History  . None  Tobacco Use  . Smoking status: Never Smoker  . Smokeless tobacco: Never Used  Substance and Sexual Activity  . Alcohol use: No  . Drug use: None  . Sexual activity: None  Other Topics Concern  . None  Social History Narrative  . None    Family History  Adopted: Yes    Review of  Systems  Constitutional: Negative for chills and fever.  Respiratory: Negative for cough, shortness of breath and wheezing.   Cardiovascular: Negative for chest pain, palpitations and leg swelling.  Neurological: Positive for numbness (fingers only). Negative for light-headedness and headaches.       Objective:   Vitals:   01/24/18 1001  BP: 120/80  Pulse: (!) 53  Resp: 16  Temp: 98.2 F (36.8 C)  SpO2: 98%   Wt Readings from Last 3 Encounters:  01/24/18 171 lb (77.6 kg)  07/24/17 164 lb (74.4 kg)  12/23/16 168 lb (76.2 kg)   Body mass index is 27.6 kg/m.   Physical Exam    Constitutional: Appears well-developed and well-nourished. No distress.  HENT:  Head: Normocephalic and atraumatic.  Neck: Neck supple. No tracheal deviation present. No thyromegaly present.  No cervical lymphadenopathy Cardiovascular: Normal rate, regular rhythm and normal heart sounds.   No murmur heard. No carotid bruit .  No edema Pulmonary/Chest: Effort normal and breath sounds normal. No respiratory distress. No has no wheezes. No rales.  Skin: Skin is warm and dry. Not diaphoretic. Did not look at scalp-currently wearing awake Psychiatric: Normal mood and affect. Behavior is normal.    Diabetic Foot Exam - Simple   Simple Foot Form Diabetic Foot exam was performed with the following findings:  Yes 01/24/2018 10:31 AM  Visual Inspection No deformities, no ulcerations, no other skin breakdown bilaterally:  Yes Sensation Testing Intact to touch and monofilament testing bilaterally:  Yes Pulse Check Posterior Tibialis and Dorsalis pulse intact bilaterally:  Yes Comments       Assessment & Plan:    See Problem List for Assessment and Plan of chronic medical problems.

## 2018-01-23 NOTE — Patient Instructions (Addendum)

## 2018-01-24 ENCOUNTER — Ambulatory Visit: Payer: Medicare Other | Admitting: Internal Medicine

## 2018-01-24 ENCOUNTER — Ambulatory Visit (INDEPENDENT_AMBULATORY_CARE_PROVIDER_SITE_OTHER): Payer: Medicare Other | Admitting: Internal Medicine

## 2018-01-24 ENCOUNTER — Other Ambulatory Visit (INDEPENDENT_AMBULATORY_CARE_PROVIDER_SITE_OTHER): Payer: Medicare Other

## 2018-01-24 ENCOUNTER — Encounter: Payer: Self-pay | Admitting: Internal Medicine

## 2018-01-24 VITALS — BP 120/80 | HR 53 | Temp 98.2°F | Resp 16 | Wt 171.0 lb

## 2018-01-24 DIAGNOSIS — L659 Nonscarring hair loss, unspecified: Secondary | ICD-10-CM | POA: Diagnosis not present

## 2018-01-24 DIAGNOSIS — I2510 Atherosclerotic heart disease of native coronary artery without angina pectoris: Secondary | ICD-10-CM | POA: Diagnosis not present

## 2018-01-24 DIAGNOSIS — E119 Type 2 diabetes mellitus without complications: Secondary | ICD-10-CM | POA: Diagnosis not present

## 2018-01-24 DIAGNOSIS — E039 Hypothyroidism, unspecified: Secondary | ICD-10-CM

## 2018-01-24 DIAGNOSIS — E7849 Other hyperlipidemia: Secondary | ICD-10-CM

## 2018-01-24 DIAGNOSIS — I10 Essential (primary) hypertension: Secondary | ICD-10-CM

## 2018-01-24 DIAGNOSIS — R200 Anesthesia of skin: Secondary | ICD-10-CM

## 2018-01-24 LAB — COMPREHENSIVE METABOLIC PANEL
ALT: 18 U/L (ref 0–35)
AST: 23 U/L (ref 0–37)
Albumin: 4.1 g/dL (ref 3.5–5.2)
Alkaline Phosphatase: 60 U/L (ref 39–117)
BUN: 13 mg/dL (ref 6–23)
CO2: 30 mEq/L (ref 19–32)
Calcium: 9.3 mg/dL (ref 8.4–10.5)
Chloride: 103 mEq/L (ref 96–112)
Creatinine, Ser: 1.03 mg/dL (ref 0.40–1.20)
GFR: 68.84 mL/min (ref >60.00)
Glucose, Bld: 96 mg/dL (ref 70–99)
Potassium: 4.6 mEq/L (ref 3.5–5.1)
Sodium: 138 mEq/L (ref 135–145)
Total Bilirubin: 0.4 mg/dL (ref 0.2–1.2)
Total Protein: 7.6 g/dL (ref 6.0–8.3)

## 2018-01-24 LAB — LIPID PANEL
Cholesterol: 146 mg/dL (ref 0–200)
HDL: 53.2 mg/dL (ref >39.00)
LDL Cholesterol: 67 mg/dL (ref 0–99)
NonHDL: 92.41
Total CHOL/HDL Ratio: 3
Triglycerides: 128 mg/dL (ref 0.0–149.0)
VLDL: 25.6 mg/dL (ref 0.0–40.0)

## 2018-01-24 LAB — VITAMIN B12: Vitamin B-12: 1106 pg/mL — ABNORMAL HIGH (ref 211–911)

## 2018-01-24 LAB — TSH: TSH: 5.79 u[IU]/mL — ABNORMAL HIGH (ref 0.35–4.50)

## 2018-01-24 LAB — HEMOGLOBIN A1C: Hgb A1c MFr Bld: 5.9 % (ref 4.6–6.5)

## 2018-01-24 NOTE — Assessment & Plan Note (Signed)
Hair getting thinner Will check thyroid Discussed to avoid biotin now due to it interfering with thyroid medication Will f/u with derm

## 2018-01-24 NOTE — Assessment & Plan Note (Signed)
BP well controlled Current regimen effective and well tolerated Continue current medications at current doses cmp  

## 2018-01-24 NOTE — Assessment & Plan Note (Signed)
Diet controlled Check a1c Low sugar / carb diet Stressed regular exercise  

## 2018-01-24 NOTE — Assessment & Plan Note (Addendum)
Right hand fingers and left index fingers Had CTS surgery years ago Will refer back to doctor who did her surgery years ago per her request Check B12 level

## 2018-01-24 NOTE — Assessment & Plan Note (Signed)
Check tsh  Titrate med dose if needed  

## 2018-01-24 NOTE — Assessment & Plan Note (Signed)
Check lipid panel  Continue daily statin Regular exercise and healthy diet encouraged  

## 2018-01-24 NOTE — Assessment & Plan Note (Signed)
No chest pain, palpitations or shortness of breath Continue current medications including aspirin, statin Check CMP , lipid panel

## 2018-01-25 ENCOUNTER — Other Ambulatory Visit: Payer: Self-pay | Admitting: Internal Medicine

## 2018-01-25 DIAGNOSIS — E039 Hypothyroidism, unspecified: Secondary | ICD-10-CM

## 2018-01-25 MED ORDER — LEVOTHYROXINE SODIUM 100 MCG PO TABS: 100.0000 ug | ORAL_TABLET | Freq: Every day | ORAL | 3 refills | Status: DC

## 2018-02-02 ENCOUNTER — Other Ambulatory Visit: Payer: Self-pay | Admitting: Internal Medicine

## 2018-03-15 ENCOUNTER — Encounter: Payer: Self-pay | Admitting: Internal Medicine

## 2018-03-28 ENCOUNTER — Other Ambulatory Visit (INDEPENDENT_AMBULATORY_CARE_PROVIDER_SITE_OTHER): Payer: Medicare Other

## 2018-03-28 DIAGNOSIS — E039 Hypothyroidism, unspecified: Secondary | ICD-10-CM | POA: Diagnosis not present

## 2018-03-28 LAB — TSH: TSH: 0.95 u[IU]/mL (ref 0.35–4.50)

## 2018-04-24 ENCOUNTER — Ambulatory Visit: Payer: Medicare Other | Admitting: Internal Medicine

## 2018-04-28 NOTE — Progress Notes (Signed)
+   Subjective:    Patient ID: Gloria [name], female    DOB: Nov 01, 1951, 67 y.o.   MRN: [id number]  HPI The patient is here for an acute visit.   Left wrist pain:  It started about two months ago.  It has gotten worse. She denies injury.  She has tried wearing a brace when exercising.  She exercises regularly - she does weights and this makes it worse.  She denies numbness/tingling in the left hand or arm.  She denies swelling.  She has put biofreeze and tigerbalm on the wrist - the tigerbalm helped a little more than the biofreeze.  She has taken aleve regularly for the past month and it has helped.   Left lower back pain:  It started about one month ago or two.  It is from the center of the back to the left lower back.  She denies radiation of the pain into the legs.  She denies numbness/tingling.  She occasionally feels it in the right lower back.    Medications and allergies reviewed with patient and updated if appropriate.  Patient Active Problem List   Diagnosis Date Noted  . Hand numbness 01/24/2018  . Hair loss 01/24/2018  . Lumbar radiculopathy 12/23/2016  . Plantar fasciitis of right foot 06/22/2016  . Leg weakness, bilateral 06/22/2016  . Carpal tunnel syndrome of right wrist 03/07/2013  . Myasthenia gravis without exacerbation (HCC) 12/31/2010  . ALOPECIA 12/31/2010  . Hypothyroidism 12/29/2010  . Type 2 diabetes, diet controlled (HCC) 12/29/2010  . Hyperlipidemia 12/29/2010  . Essential hypertension 12/29/2010  . Coronary atherosclerosis 12/29/2010  . OSTEOARTHRITIS 12/29/2010    Current Outpatient Medications on File Prior to Visit  Medication Sig Dispense Refill  . amLODipine (NORVASC) 5 MG tablet TAKE 1 TABLET(5 MG) BY MOUTH DAILY 90 tablet 1  . atorvastatin (LIPITOR) 10 MG tablet TAKE 1 TABLET BY MOUTH DAILY AT 6PM 90 tablet 1  . levothyroxine (SYNTHROID, LEVOTHROID) 100 MCG tablet Take 1 tablet (100 mcg total) by mouth daily. 90 tablet 3  . losartan (COZAAR)  100 MG tablet TAKE 1 TABLET(100 MG) BY MOUTH DAILY 90 tablet 1  . pyridostigmine (MESTINON) 60 MG tablet Take 60 mg by mouth 3 (three) times daily. Per Neurologist     . ST JOSEPH ASPIRIN PO Take 81 mg by mouth daily.    Marland Kitchen triamcinolone cream (KENALOG) 0.1 % Apply topically 2 (two) times daily. to affected area 30 g 0  . VOLTAREN 1 % GEL      No current facility-administered medications on file prior to visit.     Past Medical History:  Diagnosis Date  . Alopecia areata 10/2010 onset  . Diabetes mellitus, type 2 (HCC)   . Dyslipidemia   . Hypertension   . Hypothyroid   . Myasthenia gravis     Past Surgical History:  Procedure Laterality Date  . ABDOMINAL HYSTERECTOMY  1990    Social History   Socioeconomic History  . Marital status: Divorced    Spouse name: Not on file  . Number of children: Not on file  . Years of education: Not on file  . Highest education level: Not on file  Occupational History  . Not on file  Social Needs  . Financial resource strain: Not on file  . Food insecurity:    Worry: Not on file    Inability: Not on file  . Transportation needs:    Medical: Not on file    Non-medical: Not  on file  Tobacco Use  . Smoking status: Never Smoker  . Smokeless tobacco: Never Used  Substance and Sexual Activity  . Alcohol use: No  . Drug use: Not on file  . Sexual activity: Not on file  Lifestyle  . Physical activity:    Days per week: Not on file    Minutes per session: Not on file  . Stress: Not on file  Relationships  . Social connections:    Talks on phone: Not on file    Gets together: Not on file    Attends religious service: Not on file    Active member of club or organization: Not on file    Attends meetings of clubs or organizations: Not on file    Relationship status: Not on file  Other Topics Concern  . Not on file  Social History Narrative  . Not on file    Family History  Adopted: Yes    Review of Systems  Constitutional:  Negative for fever.  Musculoskeletal: Positive for arthralgias and back pain. Negative for gait problem and joint swelling.  Skin: Negative for color change.  Neurological: Negative for weakness and numbness.       Objective:   Vitals:   04/30/18 1531  BP: 122/82  Pulse: (!) 58  Resp: 16  Temp: 98.3 F (36.8 C)  SpO2: 99%   BP Readings from Last 3 Encounters:  04/30/18 122/82  01/24/18 120/80  07/24/17 124/80   Wt Readings from Last 3 Encounters:  04/30/18 164 lb (74.4 kg)  01/24/18 171 lb (77.6 kg)  07/24/17 164 lb (74.4 kg)   Body mass index is 26.47 kg/m.   Physical Exam  Constitutional: She appears well-developed and well-nourished. No distress.  HENT:  Head: Normocephalic and atraumatic.  Musculoskeletal: She exhibits no edema.  Tenderness with palpation posterior wrist.  Increased pain with extension.  No swelling no change in skin.  No forearm pain.  Mild tenderness left lower back, no lumbar spine tenderness or deformity, normal range of motion  Neurological: No sensory deficit. She exhibits normal muscle tone. Coordination normal.  Skin: Skin is warm and dry. She is not diaphoretic. No erythema.           Assessment & Plan:    See Problem List for Assessment and Plan of chronic medical problems.

## 2018-04-30 ENCOUNTER — Ambulatory Visit (INDEPENDENT_AMBULATORY_CARE_PROVIDER_SITE_OTHER): Payer: Medicare Other | Admitting: Internal Medicine

## 2018-04-30 ENCOUNTER — Ambulatory Visit (INDEPENDENT_AMBULATORY_CARE_PROVIDER_SITE_OTHER)
Admission: RE | Admit: 2018-04-30 | Discharge: 2018-04-30 | Disposition: A | Discharge status: Home / Self Care | Payer: Medicare Other | Source: Ambulatory Visit | Attending: Internal Medicine | Admitting: Internal Medicine

## 2018-04-30 ENCOUNTER — Encounter: Payer: Self-pay | Admitting: Internal Medicine

## 2018-04-30 VITALS — BP 122/82 | HR 58 | Temp 98.3°F | Resp 16 | Wt 164.0 lb

## 2018-04-30 DIAGNOSIS — M545 Low back pain, unspecified: Secondary | ICD-10-CM

## 2018-04-30 DIAGNOSIS — I2510 Atherosclerotic heart disease of native coronary artery without angina pectoris: Secondary | ICD-10-CM

## 2018-04-30 DIAGNOSIS — M778 Other enthesopathies, not elsewhere classified: Secondary | ICD-10-CM | POA: Diagnosis not present

## 2018-04-30 DIAGNOSIS — M5442 Lumbago with sciatica, left side: Secondary | ICD-10-CM | POA: Insufficient documentation

## 2018-04-30 NOTE — Assessment & Plan Note (Signed)
Lower back pain without radiculopathy Likely arthritic in nature We will check an x-ray since she has not had back pain in the past and she does have some concern regarding the possibility of cancer Continue Aleve Revised activities If back pain continues consider referral to sports medicine for further evaluation

## 2018-04-30 NOTE — Patient Instructions (Addendum)
Have an x-ray of your lower back today.  We will call you with the results.   Use ice more consistently.  Continue the aleve.  Use the brace on your wrist.    Revise your activities.    If your pain does not improve or resolve call the office and schedule an appointment with sports medicine in our office.       Extensor Carpi Ulnaris Tendinitis Extensor carpi ulnaris tendinitis is inflammation of the long, thin muscle that is located on the outer side of your forearm (extensor carpi ulnaris). It is a common sports-related injury. What are the causes? This condition is caused by:  Putting repeated stress on your wrist.  Using an improper technique while playing sports like golf and tennis.  Weakening of the tendon due to age or a health problem.  What increases the risk? This condition is more likely to develop in people who:  Play sports like golf, tennis, or rugby.  Are middle-aged or older.  Have an inflammatory condition, such asrheumatoid arthritis.  What are the signs or symptoms? Symptoms of this condition include:  Pain along your forearm when moving your wrist.  A constant ache on the pinkie side of your wrist.  Feeling like your grip is weaker than usual.  A popping or tearing feeling in your wrist.  Swelling.  How is this diagnosed? This condition is diagnosed based on your symptoms, your medical history, and the results of a physical exam. During your exam, your health care provider will check the strength of your grip and may ask you to bend your wrist. Your health care provider may also order an MRI or ultrasound to check for tears in your ligaments, muscles, or tendons. How is this treated?  Treatment for this condition includes:  Resting your arm.  Wearing a splint on your wrist.  Medicines to help with pain and swelling.  Applying heat or ice to the area to ease pain.  Physical therapy to strengthen and restore range of motion in your  wrist.  Follow these instructions at home: If you have a splint:  Wear it as told by your health care provider. Remove it only as told by your health care provider.  Loosen the splint if your fingers become numb and tingle, or if they turn cold and blue.  Do not let your splint get wet if it is not waterproof.  Keep the splint clean. Bathing  Do not take baths, swim, or use a hot tub until your health care provider approves. Ask your health care provider if you can take showers. You may only be allowed to take sponge baths for bathing.  If your splint is not waterproof, cover it with a watertight plastic bag when you take a bath or a shower. Managing pain, stiffness, and swelling  If directed, apply ice to the injured area. ? Put ice in a plastic bag. ? Place a towel between your skin and the bag. ? Leave the ice on for 20 minutes, 2-3 times a day. Activity  Return to your normal activities as told by your health care provider. Ask your health care provider what activities are safe for you.  Avoid activities that put strain on your wrist for as long as told by your health care provider.  Do not use your injured hand to support your body weight until your health care provider says that you can.  Do exercises as told by your health care provider. General instructions  Do not use any tobacco products, including cigarettes, chewing tobacco, or e-cigarettes. Tobacco can delay bone healing. If you need help quitting, ask your health care provider.  Take over-the-counter and prescription medicines only as told by your health care provider.  Keep all follow-up visits as told by your health care provider. This is important. How is this prevented?  Warm up and stretch before being active.  Cool down and stretch after being active.  Give your body time to rest between periods of activity.  Make sure to use equipment that fits you.  If you play golf or racket sports, try to  improve your technique or focus on proper form.  Maintain physical fitness, including: ? Strength. ? Flexibility. ? Endurance. Contact a health care provider if:  Your pain does not improve in 7--10 days.  Your pain gets worse. Get help right away if:  Your pain is severe.  You cannot move your wrist. This information is not intended to replace advice given to you by your health care provider. Make sure you discuss any questions you have with your health care provider. Document Released: 11/28/2005 Document Revised: 08/04/2016 Document Reviewed: 08/14/2015 Elsevier Interactive Patient Education  [employer].

## 2018-04-30 NOTE — Assessment & Plan Note (Signed)
She has been taking Aleve, which is helping and she will continue that for now Continue wrist brace-increase use if possible Ice when needed Continue topical medications Revised activities-advised that if she continues to do activities that cause pain will take much longer for this to heal If this does not improve consider sports medicine referral for possible steroid injection-she will let me know

## 2018-05-23 ENCOUNTER — Encounter: Payer: Self-pay | Admitting: Internal Medicine

## 2018-05-23 ENCOUNTER — Ambulatory Visit (INDEPENDENT_AMBULATORY_CARE_PROVIDER_SITE_OTHER): Payer: Medicare Other | Admitting: Internal Medicine

## 2018-05-23 ENCOUNTER — Other Ambulatory Visit: Payer: Self-pay | Admitting: Internal Medicine

## 2018-05-23 VITALS — BP 120/74 | HR 67 | Temp 98.6°F | Resp 16 | Wt 163.0 lb

## 2018-05-23 DIAGNOSIS — I2510 Atherosclerotic heart disease of native coronary artery without angina pectoris: Secondary | ICD-10-CM

## 2018-05-23 DIAGNOSIS — M5416 Radiculopathy, lumbar region: Secondary | ICD-10-CM

## 2018-05-23 MED ORDER — METHYLPREDNISOLONE ACETATE 80 MG/ML IJ SUSP
80.0000 mg | Freq: Once | INTRAMUSCULAR | Status: AC
Start: 2018-05-23 — End: 2018-05-23
  Administered 2018-05-23: 80 mg via INTRAMUSCULAR

## 2018-05-23 MED ORDER — METHOCARBAMOL 500 MG PO TABS: 500.0000 mg | ORAL_TABLET | Freq: Four times a day (QID) | ORAL | 0 refills | Status: DC

## 2018-05-23 MED ORDER — MELOXICAM 15 MG PO TABS: 15.0000 mg | ORAL_TABLET | Freq: Every day | ORAL | 0 refills | Status: DC

## 2018-05-23 NOTE — Progress Notes (Addendum)
Subjective:    Patient ID: Gloria [name], female    DOB: 01-07-1951, 67 y.o.   MRN: [id number]  HPI The patient is here for an acute visit.  A couple of years ago she had a flare of her sciatic nerve.  She has had some chronic left lower back pain, but last night it got much more severe.  She has pain in her center lower back that radiates to the left lower back. the pain is severe.  Moving last night was very difficulty due to pain.  She denies radiation of pain into her legs, but feels it radiates to her left hip.  She denies numbness/tingling in the leg.  Her legs feel weak.  She denies any changes in bowels/bladder.    When this occurred the first time she went to the ED and was prescribed methocarbamol and advil and it helped.     Medications and allergies reviewed with patient and updated if appropriate.  Patient Active Problem List   Diagnosis Date Noted  . Left wrist tendinitis 04/30/2018  . Left-sided low back pain without sciatica 04/30/2018  . Hand numbness 01/24/2018  . Hair loss 01/24/2018  . Lumbar radiculopathy 12/23/2016  . Plantar fasciitis of right foot 06/22/2016  . Leg weakness, bilateral 06/22/2016  . Carpal tunnel syndrome of right wrist 03/07/2013  . Myasthenia gravis without exacerbation (HCC) 12/31/2010  . ALOPECIA 12/31/2010  . Hypothyroidism 12/29/2010  . Type 2 diabetes, diet controlled (HCC) 12/29/2010  . Hyperlipidemia 12/29/2010  . Essential hypertension 12/29/2010  . Coronary atherosclerosis 12/29/2010  . OSTEOARTHRITIS 12/29/2010    Current Outpatient Medications on File Prior to Visit  Medication Sig Dispense Refill  . amLODipine (NORVASC) 5 MG tablet TAKE 1 TABLET(5 MG) BY MOUTH DAILY 90 tablet 1  . atorvastatin (LIPITOR) 10 MG tablet TAKE 1 TABLET BY MOUTH DAILY AT 6PM 90 tablet 1  . levothyroxine (SYNTHROID, LEVOTHROID) 100 MCG tablet Take 1 tablet (100 mcg total) by mouth daily. 90 tablet 3  . losartan (COZAAR) 100 MG tablet TAKE 1  TABLET(100 MG) BY MOUTH DAILY 90 tablet 1  . pyridostigmine (MESTINON) 60 MG tablet Take 60 mg by mouth 3 (three) times daily. Per Neurologist     . ST JOSEPH ASPIRIN PO Take 81 mg by mouth daily.    Marland Kitchen triamcinolone cream (KENALOG) 0.1 % Apply topically 2 (two) times daily. to affected area 30 g 0  . VOLTAREN 1 % GEL      No current facility-administered medications on file prior to visit.     Past Medical History:  Diagnosis Date  . Alopecia areata 10/2010 onset  . Diabetes mellitus, type 2 (HCC)   . Dyslipidemia   . Hypertension   . Hypothyroid   . Myasthenia gravis     Past Surgical History:  Procedure Laterality Date  . ABDOMINAL HYSTERECTOMY  1990    Social History   Socioeconomic History  . Marital status: Divorced    Spouse name: Not on file  . Number of children: Not on file  . Years of education: Not on file  . Highest education level: Not on file  Occupational History  . Not on file  Social Needs  . Financial resource strain: Not on file  . Food insecurity:    Worry: Not on file    Inability: Not on file  . Transportation needs:    Medical: Not on file    Non-medical: Not on file  Tobacco Use  .  Smoking status: Never Smoker  . Smokeless tobacco: Never Used  Substance and Sexual Activity  . Alcohol use: No  . Drug use: Not on file  . Sexual activity: Not on file  Lifestyle  . Physical activity:    Days per week: Not on file    Minutes per session: Not on file  . Stress: Not on file  Relationships  . Social connections:    Talks on phone: Not on file    Gets together: Not on file    Attends religious service: Not on file    Active member of club or organization: Not on file    Attends meetings of clubs or organizations: Not on file    Relationship status: Not on file  Other Topics Concern  . Not on file  Social History Narrative  . Not on file    Family History  Adopted: Yes    Review of Systems  Musculoskeletal: Positive for back pain.   Neurological: Negative for numbness.       Objective:   Vitals:   05/23/18 1445  BP: 120/74  Pulse: 67  Resp: 16  Temp: 98.6 F (37 C)  SpO2: 94%   BP Readings from Last 3 Encounters:  05/23/18 120/74  04/30/18 122/82  01/24/18 120/80   Wt Readings from Last 3 Encounters:  05/23/18 163 lb (73.9 kg)  04/30/18 164 lb (74.4 kg)  01/24/18 171 lb (77.6 kg)   Body mass index is 26.31 kg/m.   Physical Exam  Constitutional: She appears well-developed and well-nourished. No distress.  HENT:  Head: Normocephalic and atraumatic.  Musculoskeletal: She exhibits tenderness (in central lower back and left lower back). She exhibits no edema.  Neurological: No sensory deficit. She exhibits normal muscle tone.  Positive left leg raise.  Due to pain hard to measure strength  Skin: Skin is warm and dry. She is not diaphoretic.           Assessment & Plan:    See Problem List for Assessment and Plan of chronic medical problems.

## 2018-05-23 NOTE — Patient Instructions (Addendum)
You received a steroid injection here today.   Take the muscle relaxer up to 4 times a day.   Start meloxicam (an anti-inflammatory) daily with food.  Do not take any advil or aleve while on this medication.   Avoid bending, twisting or lifting.    Call if no improvement

## 2018-05-23 NOTE — Assessment & Plan Note (Signed)
Central lower back pain radiating to the the left lower back and left hip Pain is severe No concerning symptoms Depo-medrol 80 mg IM x 1 Methocarbamol 4 times a day prn meloxicam 15 mg daily  Call if no improvement

## 2018-05-30 ENCOUNTER — Other Ambulatory Visit: Payer: Self-pay | Admitting: Internal Medicine

## 2018-06-19 ENCOUNTER — Other Ambulatory Visit: Payer: Self-pay | Admitting: Internal Medicine

## 2018-07-13 ENCOUNTER — Other Ambulatory Visit: Payer: Self-pay | Admitting: Internal Medicine

## 2018-07-24 NOTE — Progress Notes (Addendum)
Subjective:   Gloria [name] is a 67 y.o. female who presents for Medicare Annual (Subsequent) preventive examination.  Review of Systems:  No ROS.  Medicare Wellness Visit. Additional risk factors are reflected in the social history.  Cardiac Risk Factors include: advanced age (>6755men, 57>65 women);diabetes mellitus;dyslipidemia;hypertension Sleep patterns: feels rested on waking, gets up 1 times nightly to void and sleeps 6-7 hours nightly.    Home Safety/Smoke Alarms: Feels safe in home. Smoke alarms in place.  Living environment; residence and Firearm Safety: 1-story house/ trailer, no firearms. Lives alone, no needs for DME, good support system Seat Belt Safety/Bike Helmet: Wears seat belt.     Objective:     Vitals: BP 132/76   Pulse (!) 51   Temp 98.2 F (36.8 C)   Resp 17   Ht 5\' 6"  (1.676 m)   Wt 165 lb (74.8 kg)   SpO2 99%   BMI 26.63 kg/m   Body mass index is 26.63 kg/m.  Advanced Directives 07/25/2018 12/12/2016  Does Patient Have a Medical Advance Directive? Yes No  Type of Estate agentAdvance Directive Healthcare Power of [place] Points[place]orney;Living will -  Copy of Healthcare Power of Attorney in Chart? No - copy requested -    Tobacco Social History   Tobacco Use  Smoking Status Never Smoker  Smokeless Tobacco Never Used     Counseling given: Not Answered   Past Medical History:  Diagnosis Date  . Alopecia areata 10/2010 onset  . Diabetes mellitus, type 2 (HCC)   . Dyslipidemia   . Hypertension   . Hypothyroid   . Myasthenia gravis    Past Surgical History:  Procedure Laterality Date  . ABDOMINAL HYSTERECTOMY  1990   Family History  Adopted: Yes   Social History   Socioeconomic History  . Marital status: Divorced    Spouse name: Not on file  . Number of children: 3  . Years of education: Not on file  . Highest education level: Not on file  Occupational History  . Not on file  Social Needs  . Financial resource strain: Not hard at all  . Food  insecurity:    Worry: Never true    Inability: Never true  . Transportation needs:    Medical: No    Non-medical: No  Tobacco Use  . Smoking status: Never Smoker  . Smokeless tobacco: Never Used  Substance and Sexual Activity  . Alcohol use: No  . Drug use: Never  . Sexual activity: Never  Lifestyle  . Physical activity:    Days per week: 4 days    Minutes per session: 40 min  . Stress: Not at all  Relationships  . Social connections:    Talks on phone: More than three times a week    Gets together: More than three times a week    Attends religious service: Never    Active member of club or organization: Patient refused    Attends meetings of clubs or organizations: More than 4 times per year    Relationship status: Divorced  Other Topics Concern  . Not on file  Social History Narrative  . Not on file    Outpatient Encounter Medications as of 07/25/2018  Medication Sig  . atorvastatin (LIPITOR) 10 MG tablet TAKE 1 TABLET BY MOUTH DAILY AT 6PM  . levothyroxine (SYNTHROID, LEVOTHROID) 100 MCG tablet Take 1 tablet (100 mcg total) by mouth daily.  Marland Kitchen. losartan (COZAAR) 100 MG tablet TAKE 1 TABLET(100 MG) BY MOUTH DAILY  .  meloxicam (MOBIC) 15 MG tablet TAKE 1 TABLET(15 MG) BY MOUTH DAILY  . pyridostigmine (MESTINON) 60 MG tablet Take 60 mg by mouth 3 (three) times daily. Per Neurologist   . ST JOSEPH ASPIRIN PO Take 81 mg by mouth daily.  Marland Kitchen. triamcinolone cream (KENALOG) 0.1 % APPLY EXTERNALLY TO THE AFFECTED AREA TWICE DAILY  . [DISCONTINUED] amLODipine (NORVASC) 5 MG tablet TAKE 1 TABLET(5 MG) BY MOUTH DAILY  . [DISCONTINUED] VOLTAREN 1 % GEL   . [DISCONTINUED] methocarbamol (ROBAXIN) 500 MG tablet Take 1 tablet (500 mg total) by mouth 4 (four) times daily. (Patient not taking: Reported on 07/25/2018)   No facility-administered encounter medications on file as of 07/25/2018.     Activities of Daily Living In your present state of health, do you have any difficulty performing  the following activities: 07/25/2018  Hearing? N  Vision? N  Difficulty concentrating or making decisions? N  Walking or climbing stairs? N  Dressing or bathing? N  Doing errands, shopping? N  Preparing Food and eating ? N  Using the Toilet? N  In the past six months, have you accidently leaked urine? N  Do you have problems with loss of bowel control? N  Managing your Medications? N  Managing your Finances? N  Housekeeping or managing your Housekeeping? N  Some recent data might be hidden    Patient Care Team: Pincus SanesBurns, Stacy J, MD as PCP - General (Internal Medicine) Caress, Fayrene FearingJames, MD (Neurology) Estrella DeedsYoakum, John, OD (Optometry) Helane GuntherMayer, Gregory, DPM (Podiatry) Janalyn Harderafeen, Stuart, MD (Dermatology) Vida RiggerMagod, Marc, MD (Gastroenterology)    Assessment:   This is a routine wellness examination for Gloria BeechamCynthia. Physical assessment deferred to PCP.   Exercise Activities and Dietary recommendations Current Exercise Habits: Home exercise routine;Structured exercise class, Type of exercise: walking;strength training/weights;calisthenics, Time (Minutes): 30, Frequency (Times/Week): 3, Weekly Exercise (Minutes/Week): 90, Intensity: Mild, Exercise limited by: orthopedic condition(s)  Diet (meal preparation, eat out, water intake, caffeinated beverages, dairy products, fruits and vegetables): in general, a "healthy" diet  , well balanced eats a variety of fruits and vegetables daily, limits salt, fat/cholesterol, sugar,carbohydrates,caffeine, drinks 6-8 glasses of water daily.  Goals    . Patient Stated     I want to take the time to start to read for enjoyment just for me. Continue eat healthy, exercise, enjoy life and love my family.       Fall Risk Fall Risk  07/25/2018 01/24/2018 11/16/2017 06/22/2016 07/22/2013  Falls in the past year? No No No No No  Comment - - Emmi Telephone Survey: data to providers prior to load - -    Depression Screen PHQ 2/9 Scores 07/25/2018 01/24/2018 06/22/2016 07/22/2013    PHQ - 2 Score 0 0 0 0     Cognitive Function       Ad8 score reviewed for issues:  Issues making decisions: no  Less interest in hobbies / activities: no  Repeats questions, stories (family complaining): no  Trouble using ordinary gadgets (microwave, computer, phone):no  Forgets the month or year: no  Mismanaging finances: no  Remembering appts: no  Daily problems with thinking and/or memory: no Ad8 score is= 0  Immunization History  Administered Date(s) Administered  . Influenza Split 09/05/2012  . Influenza,inj,Quad PF,6+ Mos 11/28/2013, 10/17/2014  . Pneumococcal Polysaccharide-23 09/05/2012  . Td 12/12/2009   Screening Tests Health Maintenance  Topic Date Due  . OPHTHALMOLOGY EXAM  06/29/2018  . HEMOGLOBIN A1C  07/24/2018  . INFLUENZA VACCINE  09/26/2018 (Originally 07/12/2018)  . PNA  vac Low Risk Adult (1 of 2 - PCV13) 01/24/2019 (Originally 07/26/2016)  . FOOT EXAM  01/24/2019  . MAMMOGRAM  10/12/2019  . TETANUS/TDAP  12/13/2019  . COLONOSCOPY  09/04/2021  . DEXA SCAN  02/01/2022  . Hepatitis C Screening  Completed      Plan:      Continue doing brain stimulating activities (puzzles, reading, adult coloring books, staying active) to keep memory sharp.   Continue to eat heart healthy diet (full of fruits, vegetables, whole grains, lean protein, water--limit salt, fat, and sugar intake) and increase physical activity as tolerated.  I have personally reviewed and noted the following in the patient's chart:   . Medical and social history . Use of alcohol, tobacco or illicit drugs  . Current medications and supplements . Functional ability and status . Nutritional status . Physical activity . Advanced directives . List of other physicians . Vitals . Screenings to include cognitive, depression, and falls . Referrals and appointments  In addition, I have reviewed and discussed with patient certain preventive protocols, quality metrics, and best  practice recommendations. A written personalized care plan for preventive services as well as general preventive health recommendations were provided to patient.     Wanda Plump, RN  07/25/2018    Medical screening examination/treatment/procedure(s) were performed by non-physician practitioner and as supervising physician I was immediately available for consultation/collaboration. I agree with above. Pincus Sanes, MD

## 2018-07-24 NOTE — Patient Instructions (Addendum)
  Test(s) ordered today. Your results will be released to MyChart (or called to you) after review, usually within 72hours after test completion. If any changes need to be made, you will be notified at that same time.  Medications reviewed and updated.  No changes recommended at this time.    Please followup in 6 months   

## 2018-07-24 NOTE — Progress Notes (Signed)
Subjective:    Patient ID: Gloria [name], female    DOB: 12-Aug-1951, 67 y.o.   MRN: [id number]  HPI The patient is here for follow up.  CAD, Hypertension: She is taking her medication daily. She is compliant with a low sodium diet.  She denies chest pain, palpitations, edema, shortness of breath and regular headaches. She is exercising regularly.      Diabetes: She is controlling her sugars with diet. She is compliant with a diabetic diet. She is exercising regularly. She checks her feet daily and denies foot lesions. She is up-to-date with an ophthalmology examination.   Hypothyroidism:  She is taking her medication daily.  She denies any recent changes in energy or weight that are unexplained.   Hyperlipidemia: She is taking her medication daily. She is compliant with a low fat/cholesterol diet. She is exercising regularly. She denies myalgias.   Lower back pain:  She still has some lower back pain.  She was here in June for acute pain - meloxicam and methocarbamol really helped.  She stilll takes the meloxicam as needed.    Medications and allergies reviewed with patient and updated if appropriate.  Patient Active Problem List   Diagnosis Date Noted  . Left wrist tendinitis 04/30/2018  . Left-sided low back pain without sciatica 04/30/2018  . Hand numbness 01/24/2018  . Hair loss 01/24/2018  . Lumbar radiculopathy 12/23/2016  . Plantar fasciitis of right foot 06/22/2016  . Leg weakness, bilateral 06/22/2016  . Carpal tunnel syndrome of right wrist 03/07/2013  . Myasthenia gravis without exacerbation (HCC) 12/31/2010  . ALOPECIA 12/31/2010  . Hypothyroidism 12/29/2010  . Type 2 diabetes, diet controlled (HCC) 12/29/2010  . Hyperlipidemia 12/29/2010  . Essential hypertension 12/29/2010  . Coronary atherosclerosis 12/29/2010  . OSTEOARTHRITIS 12/29/2010    Current Outpatient Medications on File Prior to Visit  Medication Sig Dispense Refill  . amLODipine (NORVASC) 5 MG  tablet TAKE 1 TABLET(5 MG) BY MOUTH DAILY 90 tablet 1  . atorvastatin (LIPITOR) 10 MG tablet TAKE 1 TABLET BY MOUTH DAILY AT 6PM 90 tablet 1  . levothyroxine (SYNTHROID, LEVOTHROID) 100 MCG tablet Take 1 tablet (100 mcg total) by mouth daily. 90 tablet 3  . losartan (COZAAR) 100 MG tablet TAKE 1 TABLET(100 MG) BY MOUTH DAILY 90 tablet 1  . meloxicam (MOBIC) 15 MG tablet TAKE 1 TABLET(15 MG) BY MOUTH DAILY 30 tablet 0  . pyridostigmine (MESTINON) 60 MG tablet Take 60 mg by mouth 3 (three) times daily. Per Neurologist     . ST JOSEPH ASPIRIN PO Take 81 mg by mouth daily.    Marland Kitchen. triamcinolone cream (KENALOG) 0.1 % APPLY EXTERNALLY TO THE AFFECTED AREA TWICE DAILY 30 g 0  . VOLTAREN 1 % GEL      No current facility-administered medications on file prior to visit.     Past Medical History:  Diagnosis Date  . Alopecia areata 10/2010 onset  . Diabetes mellitus, type 2 (HCC)   . Dyslipidemia   . Hypertension   . Hypothyroid   . Myasthenia gravis     Past Surgical History:  Procedure Laterality Date  . ABDOMINAL HYSTERECTOMY  1990    Social History   Socioeconomic History  . Marital status: Divorced    Spouse name: Not on file  . Number of children: 3  . Years of education: Not on file  . Highest education level: Not on file  Occupational History  . Not on file  Social Needs  .  Financial resource strain: Not hard at all  . Food insecurity:    Worry: Never true    Inability: Never true  . Transportation needs:    Medical: No    Non-medical: No  Tobacco Use  . Smoking status: Never Smoker  . Smokeless tobacco: Never Used  Substance and Sexual Activity  . Alcohol use: No  . Drug use: Never  . Sexual activity: Never  Lifestyle  . Physical activity:    Days per week: 4 days    Minutes per session: 40 min  . Stress: Not at all  Relationships  . Social connections:    Talks on phone: More than three times a week    Gets together: More than three times a week    Attends  religious service: Never    Active member of club or organization: Patient refused    Attends meetings of clubs or organizations: More than 4 times per year    Relationship status: Divorced  Other Topics Concern  . Not on file  Social History Narrative  . Not on file    Family History  Adopted: Yes    Review of Systems  Constitutional: Negative for chills and fever.  Respiratory: Negative for cough, shortness of breath and wheezing.   Cardiovascular: Negative for chest pain, palpitations and leg swelling.  Neurological: Negative for light-headedness and headaches.       Objective:   Vitals:   07/25/18 1056  BP: 132/76  Pulse: (!) 51  Resp: 16  Temp: 98.2 F (36.8 C)  SpO2: 99%   BP Readings from Last 3 Encounters:  07/25/18 132/76  07/25/18 132/76  05/23/18 120/74   Wt Readings from Last 3 Encounters:  07/25/18 165 lb (74.8 kg)  07/25/18 165 lb (74.8 kg)  05/23/18 163 lb (73.9 kg)   Body mass index is 26.63 kg/m.   Physical Exam    Constitutional: Appears well-developed and well-nourished. No distress.  HENT:  Head: Normocephalic and atraumatic.  Neck: Neck supple. No tracheal deviation present. No thyromegaly present.  No cervical lymphadenopathy Cardiovascular: Normal rate, regular rhythm and normal heart sounds.   No murmur heard. No carotid bruit .  No edema Pulmonary/Chest: Effort normal and breath sounds normal. No respiratory distress. No has no wheezes. No rales.  Skin: Skin is warm and dry. Not diaphoretic.  Psychiatric: Normal mood and affect. Behavior is normal.      Assessment & Plan:    See Problem List for Assessment and Plan of chronic medical problems.

## 2018-07-25 ENCOUNTER — Ambulatory Visit (INDEPENDENT_AMBULATORY_CARE_PROVIDER_SITE_OTHER): Payer: Medicare Other | Admitting: Internal Medicine

## 2018-07-25 ENCOUNTER — Encounter: Payer: Self-pay | Admitting: Internal Medicine

## 2018-07-25 ENCOUNTER — Other Ambulatory Visit (INDEPENDENT_AMBULATORY_CARE_PROVIDER_SITE_OTHER): Payer: Medicare Other

## 2018-07-25 ENCOUNTER — Ambulatory Visit (INDEPENDENT_AMBULATORY_CARE_PROVIDER_SITE_OTHER): Payer: Medicare Other | Admitting: *Deleted

## 2018-07-25 VITALS — BP 132/76 | HR 51 | Temp 98.2°F | Resp 17 | Ht 66.0 in | Wt 165.0 lb

## 2018-07-25 VITALS — BP 132/76 | HR 51 | Temp 98.2°F | Resp 16 | Wt 165.0 lb

## 2018-07-25 DIAGNOSIS — I10 Essential (primary) hypertension: Secondary | ICD-10-CM

## 2018-07-25 DIAGNOSIS — E039 Hypothyroidism, unspecified: Secondary | ICD-10-CM

## 2018-07-25 DIAGNOSIS — M5416 Radiculopathy, lumbar region: Secondary | ICD-10-CM

## 2018-07-25 DIAGNOSIS — Z0000 Encounter for general adult medical examination without abnormal findings: Secondary | ICD-10-CM

## 2018-07-25 DIAGNOSIS — E119 Type 2 diabetes mellitus without complications: Secondary | ICD-10-CM | POA: Diagnosis not present

## 2018-07-25 DIAGNOSIS — E7849 Other hyperlipidemia: Secondary | ICD-10-CM

## 2018-07-25 DIAGNOSIS — R29898 Other symptoms and signs involving the musculoskeletal system: Secondary | ICD-10-CM | POA: Diagnosis not present

## 2018-07-25 DIAGNOSIS — I2510 Atherosclerotic heart disease of native coronary artery without angina pectoris: Secondary | ICD-10-CM

## 2018-07-25 DIAGNOSIS — R252 Cramp and spasm: Secondary | ICD-10-CM

## 2018-07-25 LAB — COMPREHENSIVE METABOLIC PANEL
ALT: 15 U/L (ref 0–35)
AST: 22 U/L (ref 0–37)
Albumin: 4.1 g/dL (ref 3.5–5.2)
Alkaline Phosphatase: 60 U/L (ref 39–117)
BUN: 18 mg/dL (ref 6–23)
CO2: 30 mEq/L (ref 19–32)
Calcium: 9.7 mg/dL (ref 8.4–10.5)
Chloride: 104 mEq/L (ref 96–112)
Creatinine, Ser: 1.11 mg/dL (ref 0.40–1.20)
GFR: 63.05 mL/min (ref >60.00)
Glucose, Bld: 99 mg/dL (ref 70–99)
Potassium: 3.8 mEq/L (ref 3.5–5.1)
Sodium: 139 mEq/L (ref 135–145)
Total Bilirubin: 0.6 mg/dL (ref 0.2–1.2)
Total Protein: 7.5 g/dL (ref 6.0–8.3)

## 2018-07-25 LAB — LIPID PANEL
Cholesterol: 139 mg/dL (ref 0–200)
HDL: 60.6 mg/dL (ref >39.00)
LDL Cholesterol: 67 mg/dL (ref 0–99)
NonHDL: 78.83
Total CHOL/HDL Ratio: 2
Triglycerides: 58 mg/dL (ref 0.0–149.0)
VLDL: 11.6 mg/dL (ref 0.0–40.0)

## 2018-07-25 LAB — CBC WITH DIFFERENTIAL/PLATELET
Basophils Absolute: 0.1 10*3/uL (ref 0.0–0.1)
Basophils Relative: 1 % (ref 0.0–3.0)
Eosinophils Absolute: 0.3 10*3/uL (ref 0.0–0.7)
Eosinophils Relative: 6.3 % — ABNORMAL HIGH (ref 0.0–5.0)
HCT: 38.1 % (ref 36.0–46.0)
Hemoglobin: 12.8 g/dL (ref 12.0–15.0)
Lymphocytes Relative: 22.9 % (ref 12.0–46.0)
Lymphs Abs: 1.2 10*3/uL (ref 0.7–4.0)
MCHC: 33.6 g/dL (ref 30.0–36.0)
MCV: 93.1 fl (ref 78.0–100.0)
Monocytes Absolute: 0.7 10*3/uL (ref 0.1–1.0)
Monocytes Relative: 12.2 % — ABNORMAL HIGH (ref 3.0–12.0)
Neutro Abs: 3.1 10*3/uL (ref 1.4–7.7)
Neutrophils Relative %: 57.6 % (ref 43.0–77.0)
Platelets: 202 10*3/uL (ref 150.0–400.0)
RBC: 4.1 Mil/uL (ref 3.87–5.11)
RDW: 13.9 % (ref 11.5–15.5)
WBC: 5.5 10*3/uL (ref 4.0–10.5)

## 2018-07-25 LAB — MAGNESIUM: Magnesium: 2 mg/dL (ref 1.5–2.5)

## 2018-07-25 LAB — TSH: TSH: 0.39 u[IU]/mL (ref 0.35–4.50)

## 2018-07-25 LAB — HEMOGLOBIN A1C: Hgb A1c MFr Bld: 5.8 % (ref 4.6–6.5)

## 2018-07-25 MED ORDER — AMLODIPINE BESYLATE 5 MG PO TABS: ORAL_TABLET | ORAL | 1 refills | Status: DC

## 2018-07-25 MED ORDER — VOLTAREN 1 % TD GEL: 4.0000 g | Freq: Four times a day (QID) | TRANSDERMAL | 5 refills | Status: DC

## 2018-07-25 MED ORDER — METHOCARBAMOL 500 MG PO TABS: 500.0000 mg | ORAL_TABLET | Freq: Four times a day (QID) | ORAL | 0 refills | Status: DC

## 2018-07-25 NOTE — Assessment & Plan Note (Signed)
BP well controlled Current regimen effective and well tolerated Continue current medications at current doses cmp  

## 2018-07-25 NOTE — Patient Instructions (Addendum)
Continue doing brain stimulating activities (puzzles, reading, adult coloring books, staying active) to keep memory sharp.   Continue to eat heart healthy diet (full of fruits, vegetables, whole grains, lean protein, water--limit salt, fat, and sugar intake) and increase physical activity as tolerated.   Gloria [name] , Thank you for taking time to come for your Medicare Wellness Visit. I appreciate your ongoing commitment to your health goals. Please review the following plan we discussed and let me know if I can assist you in the future.   These are the goals we discussed: Goals    . Patient Stated     I want to take the time to start to read for enjoyment just for me. Continue eat healthy, exercise, enjoy life and love my family.       This is a list of the screening recommended for you and due dates:  Health Maintenance  Topic Date Due  . Eye exam for diabetics  06/29/2018  . Hemoglobin A1C  07/24/2018  . Flu Shot  09/26/2018*  . Pneumonia vaccines (1 of 2 - PCV13) 01/24/2019*  . Complete foot exam   01/24/2019  . Mammogram  10/12/2019  . Tetanus Vaccine  12/13/2019  . Colon Cancer Screening  09/04/2021  . DEXA scan (bone density measurement)  02/01/2022  .  Hepatitis C: One time screening is recommended by Center for Disease Control  (CDC) for  adults born from [birth year] through 1965.   Completed  *Topic was postponed. The date shown is not the original due date.    Health Maintenance, Female Adopting a healthy lifestyle and getting preventive care can go a long way to promote health and wellness. Talk with your health care provider about what schedule of regular examinations is right for you. This is a good chance for you to check in with your provider about disease prevention and staying healthy. In between checkups, there are plenty of things you can do on your own. Experts have done a lot of research about which lifestyle changes and preventive measures are most likely to keep you  healthy. Ask your health care provider for more information. Weight and diet Eat a healthy diet  Be sure to include plenty of vegetables, fruits, low-fat dairy products, and lean protein.  Do not eat a lot of foods high in solid fats, added sugars, or salt.  Get regular exercise. This is one of the most important things you can do for your health. ? Most adults should exercise for at least 150 minutes each week. The exercise should increase your heart rate and make you sweat (moderate-intensity exercise). ? Most adults should also do strengthening exercises at least twice a week. This is in addition to the moderate-intensity exercise.  Maintain a healthy weight  Body mass index (BMI) is a measurement that can be used to identify possible weight problems. It estimates body fat based on height and weight. Your health care provider can help determine your BMI and help you achieve or maintain a healthy weight.  For females 72 years of age and older: ? A BMI below 18.5 is considered underweight. ? A BMI of 18.5 to 24.9 is normal. ? A BMI of 25 to 29.9 is considered overweight. ? A BMI of 30 and above is considered obese.  Watch levels of cholesterol and blood lipids  You should start having your blood tested for lipids and cholesterol at 67 years of age, then have this test every 5 years.  You may  need to have your cholesterol levels checked more often if: ? Your lipid or cholesterol levels are high. ? You are older than 67 years of age. ? You are at high risk for heart disease.  Cancer screening Lung Cancer  Lung cancer screening is recommended for adults 2-68 years old who are at high risk for lung cancer because of a history of smoking.  A yearly low-dose CT scan of the lungs is recommended for people who: ? Currently smoke. ? Have quit within the past 15 years. ? Have at least a 30-pack-year history of smoking. A pack year is smoking an average of one pack of cigarettes a day  for 1 year.  Yearly screening should continue until it has been 15 years since you quit.  Yearly screening should stop if you develop a health problem that would prevent you from having lung cancer treatment.  Breast Cancer  Practice breast self-awareness. This means understanding how your breasts normally appear and feel.  It also means doing regular breast self-exams. Let your health care provider know about any changes, no matter how small.  If you are in your 20s or 30s, you should have a clinical breast exam (CBE) by a health care provider every 1-3 years as part of a regular health exam.  If you are 42 or older, have a CBE every year. Also consider having a breast X-ray (mammogram) every year.  If you have a family history of breast cancer, talk to your health care provider about genetic screening.  If you are at high risk for breast cancer, talk to your health care provider about having an MRI and a mammogram every year.  Breast cancer gene (BRCA) assessment is recommended for women who have family members with BRCA-related cancers. BRCA-related cancers include: ? Breast. ? Ovarian. ? Tubal. ? Peritoneal cancers.  Results of the assessment will determine the need for genetic counseling and BRCA1 and BRCA2 testing.  Cervical Cancer Your health care provider may recommend that you be screened regularly for cancer of the pelvic organs (ovaries, uterus, and vagina). This screening involves a pelvic examination, including checking for microscopic changes to the surface of your cervix (Pap test). You may be encouraged to have this screening done every 3 years, beginning at age 63.  For women ages 43-65, health care providers may recommend pelvic exams and Pap testing every 3 years, or they may recommend the Pap and pelvic exam, combined with testing for human papilloma virus (HPV), every 5 years. Some types of HPV increase your risk of cervical cancer. Testing for HPV may also be done  on women of any age with unclear Pap test results.  Other health care providers may not recommend any screening for nonpregnant women who are considered low risk for pelvic cancer and who do not have symptoms. Ask your health care provider if a screening pelvic exam is right for you.  If you have had past treatment for cervical cancer or a condition that could lead to cancer, you need Pap tests and screening for cancer for at least 20 years after your treatment. If Pap tests have been discontinued, your risk factors (such as having a new sexual partner) need to be reassessed to determine if screening should resume. Some women have medical problems that increase the chance of getting cervical cancer. In these cases, your health care provider may recommend more frequent screening and Pap tests.  Colorectal Cancer  This type of cancer can be detected and often  prevented.  Routine colorectal cancer screening usually begins at 67 years of age and continues through 68 years of age.  Your health care provider may recommend screening at an earlier age if you have risk factors for colon cancer.  Your health care provider may also recommend using home test kits to check for hidden blood in the stool.  A small camera at the end of a tube can be used to examine your colon directly (sigmoidoscopy or colonoscopy). This is done to check for the earliest forms of colorectal cancer.  Routine screening usually begins at age 62.  Direct examination of the colon should be repeated every 5-10 years through 67 years of age. However, you may need to be screened more often if early forms of precancerous polyps or small growths are found.  Skin Cancer  Check your skin from head to toe regularly.  Tell your health care provider about any new moles or changes in moles, especially if there is a change in a mole's shape or color.  Also tell your health care provider if you have a mole that is larger than the size of  a pencil eraser.  Always use sunscreen. Apply sunscreen liberally and repeatedly throughout the day.  Protect yourself by wearing long sleeves, pants, a wide-brimmed hat, and sunglasses whenever you are outside.  Heart disease, diabetes, and high blood pressure  High blood pressure causes heart disease and increases the risk of stroke. High blood pressure is more likely to develop in: ? People who have blood pressure in the high end of the normal range (130-139/85-89 mm Hg). ? People who are overweight or obese. ? People who are African American.  If you are 45-95 years of age, have your blood pressure checked every 3-5 years. If you are 38 years of age or older, have your blood pressure checked every year. You should have your blood pressure measured twice-once when you are at a hospital or clinic, and once when you are not at a hospital or clinic. Record the average of the two measurements. To check your blood pressure when you are not at a hospital or clinic, you can use: ? An automated blood pressure machine at a pharmacy. ? A home blood pressure monitor.  If you are between 3 years and 61 years old, ask your health care provider if you should take aspirin to prevent strokes.  Have regular diabetes screenings. This involves taking a blood sample to check your fasting blood sugar level. ? If you are at a normal weight and have a low risk for diabetes, have this test once every three years after 67 years of age. ? If you are overweight and have a high risk for diabetes, consider being tested at a younger age or more often. Preventing infection Hepatitis B  If you have a higher risk for hepatitis B, you should be screened for this virus. You are considered at high risk for hepatitis B if: ? You were born in a country where hepatitis B is common. Ask your health care provider which countries are considered high risk. ? Your parents were born in a high-risk country, and you have not been  immunized against hepatitis B (hepatitis B vaccine). ? You have HIV or AIDS. ? You use needles to inject street drugs. ? You live with someone who has hepatitis B. ? You have had sex with someone who has hepatitis B. ? You get hemodialysis treatment. ? You take certain medicines for conditions, including  cancer, organ transplantation, and autoimmune conditions.  Hepatitis C  Blood testing is recommended for: ? Everyone born from [birth year] through 1965. ? Anyone with known risk factors for hepatitis C.  Sexually transmitted infections (STIs)  You should be screened for sexually transmitted infections (STIs) including gonorrhea and chlamydia if: ? You are sexually active and are younger than 67 years of age. ? You are older than 67 years of age and your health care provider tells you that you are at risk for this type of infection. ? Your sexual activity has changed since you were last screened and you are at an increased risk for chlamydia or gonorrhea. Ask your health care provider if you are at risk.  If you do not have HIV, but are at risk, it may be recommended that you take a prescription medicine daily to prevent HIV infection. This is called pre-exposure prophylaxis (PrEP). You are considered at risk if: ? You are sexually active and do not regularly use condoms or know the HIV status of your partner(s). ? You take drugs by injection. ? You are sexually active with a partner who has HIV.  Talk with your health care provider about whether you are at high risk of being infected with HIV. If you choose to begin PrEP, you should first be tested for HIV. You should then be tested every 3 months for as long as you are taking PrEP. Pregnancy  If you are premenopausal and you may become pregnant, ask your health care provider about preconception counseling.  If you may become pregnant, take 400 to 800 micrograms (mcg) of folic acid every day.  If you want to prevent pregnancy, talk to your  health care provider about birth control (contraception). Osteoporosis and menopause  Osteoporosis is a disease in which the bones lose minerals and strength with aging. This can result in serious bone fractures. Your risk for osteoporosis can be identified using a bone density scan.  If you are 29 years of age or older, or if you are at risk for osteoporosis and fractures, ask your health care provider if you should be screened.  Ask your health care provider whether you should take a calcium or vitamin D supplement to lower your risk for osteoporosis.  Menopause may have certain physical symptoms and risks.  Hormone replacement therapy may reduce some of these symptoms and risks. Talk to your health care provider about whether hormone replacement therapy is right for you. Follow these instructions at home:  Schedule regular health, dental, and eye exams.  Stay current with your immunizations.  Do not use any tobacco products including cigarettes, chewing tobacco, or electronic cigarettes.  If you are pregnant, do not drink alcohol.  If you are breastfeeding, limit how much and how often you drink alcohol.  Limit alcohol intake to no more than 1 drink per day for nonpregnant women. One drink equals 12 ounces of beer, 5 ounces of Makailee Nudelman, or 1 ounces of hard liquor.  Do not use street drugs.  Do not share needles.  Ask your health care provider for help if you need support or information about quitting drugs.  Tell your health care provider if you often feel depressed.  Tell your health care provider if you have ever been abused or do not feel safe at home. This information is not intended to replace advice given to you by your health care provider. Make sure you discuss any questions you have with your health care provider. Document Released:  06/13/2011 Document Revised: 05/05/2016 Document Reviewed: 09/01/2015 Elsevier Interactive Patient Education  [employer].

## 2018-07-25 NOTE — Assessment & Plan Note (Signed)
Check lipid panel  Continue daily statin Regular exercise and healthy diet encouraged  

## 2018-07-25 NOTE — Assessment & Plan Note (Addendum)
Still with some lower back pain, no radiculopathy Taking mobic as needed Continuing to exercise regularly If pain worsens or does not improve can consider referral to sports medicine or orthopedics

## 2018-07-25 NOTE — Assessment & Plan Note (Signed)
Having intermittent leg cramping Check cmp, mg She hydrates enough during the day She exercises daily Having some lower back pain - may be related to back issues

## 2018-07-25 NOTE — Assessment & Plan Note (Signed)
No chest pain, palps, sob Continue asa, statin Cmp, lipid, cbc

## 2018-07-25 NOTE — Assessment & Plan Note (Signed)
Check tsh  Titrate med dose if needed  

## 2018-07-25 NOTE — Assessment & Plan Note (Signed)
Diet controlled with sugars in the prediabetic range Check A1c Eye exam up-to-date Continue regular exercise and diabetic diet Follow-up in 6 months

## 2018-07-27 ENCOUNTER — Other Ambulatory Visit: Payer: Self-pay | Admitting: Emergency Medicine

## 2018-07-27 NOTE — Telephone Encounter (Signed)
Pt insurance prefers diclofenac sodium, ibuprofen, naproxen over Voltaren

## 2018-07-28 NOTE — Telephone Encounter (Signed)
Let her know we can try diclofenac gel - it is similar to the voltaren gel which is not covered by her insurance.  It is pending if she wants to try it.

## 2018-07-30 NOTE — Telephone Encounter (Signed)
LVM for pt to call back in regards.  

## 2018-07-31 NOTE — Telephone Encounter (Signed)
Pt would like a PA on the VOLTAREN gel because she has used it before and insurance has covered it before.  Please advise.

## 2018-08-03 ENCOUNTER — Other Ambulatory Visit: Payer: Self-pay

## 2018-08-03 MED ORDER — VOLTAREN 1 % TD GEL: 4.0000 g | Freq: Four times a day (QID) | TRANSDERMAL | 5 refills | Status: DC

## 2018-08-06 ENCOUNTER — Other Ambulatory Visit: Payer: Self-pay

## 2018-08-06 MED ORDER — DICLOFENAC SODIUM 1 % TD GEL: 4.0000 g | Freq: Four times a day (QID) | TRANSDERMAL | 5 refills | Status: DC

## 2018-08-06 NOTE — Telephone Encounter (Signed)
Copied from CRM [id number]. Topic: Quick Communication - Office Called Patient >> Jul 30, 2018 10:29 AM Barbette ReichmannWrenn, Roger Kettles E, CMA wrote: Reason for CRM: LVM for pt to call back in regards to medication alternative. Sent pt over to assistant when she returns call please. >> Aug 06, 2018  9:08 AM Gerrianne ScalePayne, Angela L wrote: Pt calling Ladona Ridgelaylor back about an alternative for Valtaren she states that she would like to get the medicine that the provider had recommended

## 2018-08-06 NOTE — Telephone Encounter (Signed)
LVM letting pt know that alternative has been sent to her pharmacy.

## 2018-08-14 ENCOUNTER — Other Ambulatory Visit: Payer: Self-pay | Admitting: Internal Medicine

## 2018-08-14 MED ORDER — MELOXICAM 15 MG PO TABS: ORAL_TABLET | ORAL | 2 refills | Status: DC

## 2018-08-14 NOTE — Telephone Encounter (Signed)
Reviewed chart pt is up-to-date sent refills to pof.../lmb  

## 2018-08-14 NOTE — Telephone Encounter (Signed)
Refill of Mobic  LOV 07/25/18 Dr. Lawerance Bach  LRF 06/20/18  #30  0 refills   [id number] [place], [place] - [address] AT [facility] OF [facility] & [address] [place] [address] [place] [address] Phone: [phone] Fax: [phone]

## 2018-08-14 NOTE — Telephone Encounter (Signed)
Copied from CRM [id number]. Topic: Quick Communication - Rx Refill/Question >> Aug 14, 2018 11:13 AM Jens Som A wrote: Medication: meloxicam (MOBIC) 15 MG tablet [[id number]]   Has the patient contacted their pharmacy? Yes  (Agent: If no, request that the patient contact the pharmacy for the refill.) (Agent: If yes, when and what did the pharmacy advise?)  Preferred Pharmacy (with phone number or street name): [facility] #[id number] [place] Otto, [place] - [address] AT [facility] OF [facility] & [address] [place] [address] Phone: [phone] Fax: [phone]    Agent: Please be advised that RX refills may take up to 3 business days. We ask that you follow-up with your pharmacy.

## 2018-09-25 ENCOUNTER — Other Ambulatory Visit: Payer: Self-pay | Admitting: Internal Medicine

## 2018-09-25 DIAGNOSIS — Z1231 Encounter for screening mammogram for malignant neoplasm of breast: Secondary | ICD-10-CM

## 2018-11-01 ENCOUNTER — Ambulatory Visit
Admission: RE | Admit: 2018-11-01 | Discharge: 2018-11-01 | Disposition: A | Discharge status: Home / Self Care | Payer: Medicare Other | Source: Ambulatory Visit | Attending: Internal Medicine | Admitting: Internal Medicine

## 2018-11-01 DIAGNOSIS — Z1231 Encounter for screening mammogram for malignant neoplasm of breast: Secondary | ICD-10-CM

## 2018-11-11 ENCOUNTER — Other Ambulatory Visit: Payer: Self-pay | Admitting: Internal Medicine

## 2018-11-30 ENCOUNTER — Other Ambulatory Visit: Payer: Self-pay | Admitting: Internal Medicine

## 2018-12-14 ENCOUNTER — Other Ambulatory Visit: Payer: Self-pay | Admitting: Internal Medicine

## 2018-12-29 ENCOUNTER — Other Ambulatory Visit: Payer: Self-pay | Admitting: Internal Medicine

## 2019-01-14 ENCOUNTER — Other Ambulatory Visit: Payer: Self-pay | Admitting: Internal Medicine

## 2019-01-27 ENCOUNTER — Other Ambulatory Visit: Payer: Self-pay | Admitting: Internal Medicine

## 2019-02-06 ENCOUNTER — Encounter: Payer: Medicare Other | Admitting: Internal Medicine

## 2019-02-06 NOTE — Patient Instructions (Signed)
  Tests ordered today. Your results will be released to MyChart (or called to you) after review, usually within 72hours after test completion. If any changes need to be made, you will be notified at that same time.  All other Health Maintenance issues reviewed.   All recommended immunizations and age-appropriate screenings are up-to-date or discussed.  No immunizations administered today.   Medications reviewed and updated.  Changes include :     Your prescription(s) have been submitted to your pharmacy. Please take as directed and contact our office if you believe you are having problem(s) with the medication(s).  A referral was ordered for   Please followup in 6 months   

## 2019-02-06 NOTE — Progress Notes (Signed)
Subjective:    Patient ID: Gloria [name], female    DOB: 1951/06/16, 68 y.o.   MRN: [id number]  HPI  Medications and allergies reviewed with patient and updated if appropriate.  Patient Active Problem List   Diagnosis Date Noted  . Leg cramping 07/25/2018  . Left wrist tendinitis 04/30/2018  . Left-sided low back pain without sciatica 04/30/2018  . Hand numbness 01/24/2018  . Hair loss 01/24/2018  . Lumbar radiculopathy 12/23/2016  . Plantar fasciitis of right foot 06/22/2016  . Leg weakness, bilateral 06/22/2016  . Carpal tunnel syndrome of right wrist 03/07/2013  . Myasthenia gravis without exacerbation (HCC) 12/31/2010  . ALOPECIA 12/31/2010  . Hypothyroidism 12/29/2010  . Type 2 diabetes, diet controlled (HCC) 12/29/2010  . Hyperlipidemia 12/29/2010  . Essential hypertension 12/29/2010  . Coronary atherosclerosis 12/29/2010  . OSTEOARTHRITIS 12/29/2010    Current Outpatient Medications on File Prior to Visit  Medication Sig Dispense Refill  . amLODipine (NORVASC) 5 MG tablet TAKE 1 TABLET(5 MG) BY MOUTH DAILY 90 tablet 1  . atorvastatin (LIPITOR) 10 MG tablet TAKE 1 TABLET BY MOUTH DAILY AT 6PM 90 tablet 1  . diclofenac sodium (VOLTAREN) 1 % GEL Apply 4 g topically 4 (four) times daily. 100 g 5  . levothyroxine (SYNTHROID, LEVOTHROID) 100 MCG tablet TAKE 1 TABLET(100 MCG) BY MOUTH DAILY 90 tablet 1  . losartan (COZAAR) 100 MG tablet TAKE 1 TABLET(100 MG) BY MOUTH DAILY 90 tablet 1  . meloxicam (MOBIC) 15 MG tablet TAKE 1 TABLET(15 MG) BY MOUTH DAILY 30 tablet 0  . methocarbamol (ROBAXIN) 500 MG tablet Take 1 tablet (500 mg total) by mouth 4 (four) times daily. 40 tablet 0  . pyridostigmine (MESTINON) 60 MG tablet Take 60 mg by mouth 3 (three) times daily. Per Neurologist     . ST JOSEPH ASPIRIN PO Take 81 mg by mouth daily.    Marland Kitchen triamcinolone cream (KENALOG) 0.1 % APPLY EXTERNALLY TO THE AFFECTED AREA TWICE DAILY 30 g 0   No current facility-administered  medications on file prior to visit.     Past Medical History:  Diagnosis Date  . Alopecia areata 10/2010 onset  . Diabetes mellitus, type 2 (HCC)   . Dyslipidemia   . Hypertension   . Hypothyroid   . Myasthenia gravis     Past Surgical History:  Procedure Laterality Date  . ABDOMINAL HYSTERECTOMY  1990    Social History   Socioeconomic History  . Marital status: Divorced    Spouse name: Not on file  . Number of children: 3  . Years of education: Not on file  . Highest education level: Not on file  Occupational History  . Not on file  Social Needs  . Financial resource strain: Not hard at all  . Food insecurity:    Worry: Never true    Inability: Never true  . Transportation needs:    Medical: No    Non-medical: No  Tobacco Use  . Smoking status: Never Smoker  . Smokeless tobacco: Never Used  Substance and Sexual Activity  . Alcohol use: No  . Drug use: Never  . Sexual activity: Never  Lifestyle  . Physical activity:    Days per week: 4 days    Minutes per session: 40 min  . Stress: Not at all  Relationships  . Social connections:    Talks on phone: More than three times a week    Gets together: More than three times a week  Attends religious service: Never    Active member of club or organization: Patient refused    Attends meetings of clubs or organizations: More than 4 times per year    Relationship status: Divorced  Other Topics Concern  . Not on file  Social History Narrative  . Not on file    Family History  Adopted: Yes  Problem Relation Age of Onset  . Breast cancer Neg Hx     Review of Systems     Objective:  There were no vitals filed for this visit. BP Readings from Last 3 Encounters:  07/25/18 132/76  07/25/18 132/76  05/23/18 120/74   Wt Readings from Last 3 Encounters:  07/25/18 165 lb (74.8 kg)  07/25/18 165 lb (74.8 kg)  05/23/18 163 lb (73.9 kg)   There is no height or weight on file to calculate BMI.   Physical  Exam    Constitutional: Appears well-developed and well-nourished. No distress.  HENT:  Head: Normocephalic and atraumatic.  Neck: Neck supple. No tracheal deviation present. No thyromegaly present.  No cervical lymphadenopathy Cardiovascular: Normal rate, regular rhythm and normal heart sounds.   No murmur heard. No carotid bruit .  No edema Pulmonary/Chest: Effort normal and breath sounds normal. No respiratory distress. No has no wheezes. No rales.  Skin: Skin is warm and dry. Not diaphoretic.  Psychiatric: Normal mood and affect. Behavior is normal.      Assessment & Plan:    See Problem List for Assessment and Plan of chronic medical problems.   This encounter was created in error - please disregard.

## 2019-02-11 ENCOUNTER — Other Ambulatory Visit: Payer: Self-pay | Admitting: Internal Medicine

## 2019-03-05 NOTE — Progress Notes (Signed)
Virtual Visit via Video Note  I connected with Gloria [name] on 03/05/19 at  1:30 PM EDT by a video enabled telemedicine application and verified that I am speaking with the correct person using two identifiers.   I discussed the limitations of evaluation and management by telemedicine and the availability of in person appointments. The patient expressed understanding and agreed to proceed.  History of Present Illness: She is here for follow up of her chronic medical conditions.   She is not exercising regularly-she has been sick for the past couple of weeks and has not been exercising regularly, but plans on getting back into it once able.  She has been at home for the past 15 days straight by herself.  She had flu or bad cold symptoms.  She treated herself with over-the-counter cold medications.  She is still experiencing a hacking cough at night and it does affect her sleep.  The cough is better, just persistent.  She initially had chills, fevers, aches and some mild wheezing.  She still has some mild wheeze, but denies any fevers, chills or aches at this point.     CAD, Hypertension: She is taking her medication daily. She is compliant with a low sodium diet.  She denies chest pain, palpitations, edema, shortness of breath and regular headaches.  Diabetes: She is taking her medication daily as prescribed. She is compliant with a diabetic diet.  She does not monitor her sugars at home.  She is up-to-date with an ophthalmology examination.   Hypothyroidism:  She is taking her medication daily.  She denies any recent changes in energy or weight that are unexplained.   Hyperlipidemia: She is taking her medication daily. She is compliant with a low fat/cholesterol diet. She denies myalgias.   Lower back pain, chronic:  She has intermittent pain and takes mobic daily.  She has in the past taken methocarbamol as needed, but has not taken that in a while.   Review of systems: Positive persistent  cough from recent illness.  Mild wheeze, at night.  She denies fever, chills, shortness of breath, chest pain, palpitations, leg edema, headaches and lightheadedness.   Observations/Objective:  Last labs 07/2018 - all WNL and stable.  Lab Results  Component Value Date   WBC 5.5 07/25/2018   HGB 12.8 07/25/2018   HCT 38.1 07/25/2018   PLT 202.0 07/25/2018   GLUCOSE 99 07/25/2018   CHOL 139 07/25/2018   TRIG 58.0 07/25/2018   HDL 60.60 07/25/2018   LDLCALC 67 07/25/2018   ALT 15 07/25/2018   AST 22 07/25/2018   NA 139 07/25/2018   K 3.8 07/25/2018   CL 104 07/25/2018   CREATININE 1.11 07/25/2018   BUN 18 07/25/2018   CO2 30 07/25/2018   TSH 0.39 07/25/2018   HGBA1C 5.8 07/25/2018   MICROALBUR 2.0 (H) 12/23/2016     Assessment and Plan:  See Problem List for Assessment and Plan of chronic medical problems.   Follow Up Instructions:    I discussed the assessment and treatment plan with the patient. The patient was provided an opportunity to ask questions and all were answered. The patient agreed with the plan and demonstrated an understanding of the instructions.   The patient was advised to call back or seek an in-person evaluation if the symptoms worsen or if the condition fails to improve as anticipated.  I provided 25 minutes of non-face-to-face time during this encounter.   Pincus Sanes, MD

## 2019-03-06 ENCOUNTER — Encounter: Payer: Self-pay | Admitting: Internal Medicine

## 2019-03-06 ENCOUNTER — Ambulatory Visit (INDEPENDENT_AMBULATORY_CARE_PROVIDER_SITE_OTHER): Payer: Medicare Other | Admitting: Internal Medicine

## 2019-03-06 DIAGNOSIS — E119 Type 2 diabetes mellitus without complications: Secondary | ICD-10-CM | POA: Diagnosis not present

## 2019-03-06 DIAGNOSIS — R059 Cough, unspecified: Secondary | ICD-10-CM

## 2019-03-06 DIAGNOSIS — E7849 Other hyperlipidemia: Secondary | ICD-10-CM

## 2019-03-06 DIAGNOSIS — I2510 Atherosclerotic heart disease of native coronary artery without angina pectoris: Secondary | ICD-10-CM | POA: Diagnosis not present

## 2019-03-06 DIAGNOSIS — I10 Essential (primary) hypertension: Secondary | ICD-10-CM | POA: Diagnosis not present

## 2019-03-06 DIAGNOSIS — E039 Hypothyroidism, unspecified: Secondary | ICD-10-CM | POA: Diagnosis not present

## 2019-03-06 DIAGNOSIS — R05 Cough: Secondary | ICD-10-CM

## 2019-03-06 DIAGNOSIS — M5416 Radiculopathy, lumbar region: Secondary | ICD-10-CM

## 2019-03-06 MED ORDER — HYDROCODONE-HOMATROPINE 5-1.5 MG/5ML PO SYRP: 5.0000 mL | ORAL_SOLUTION | Freq: Three times a day (TID) | ORAL | 0 refills | Status: DC | PRN

## 2019-03-06 MED ORDER — LOSARTAN POTASSIUM 100 MG PO TABS: ORAL_TABLET | ORAL | 1 refills | Status: DC

## 2019-03-06 MED ORDER — MELOXICAM 15 MG PO TABS: ORAL_TABLET | ORAL | 1 refills | Status: DC

## 2019-03-06 MED ORDER — ATORVASTATIN CALCIUM 10 MG PO TABS: ORAL_TABLET | ORAL | 1 refills | Status: DC

## 2019-03-06 MED ORDER — METHOCARBAMOL 500 MG PO TABS: 500.0000 mg | ORAL_TABLET | Freq: Four times a day (QID) | ORAL | 0 refills | Status: DC

## 2019-03-06 MED ORDER — AMLODIPINE BESYLATE 5 MG PO TABS: ORAL_TABLET | ORAL | 1 refills | Status: DC

## 2019-03-06 NOTE — Assessment & Plan Note (Signed)
Lipids have been well controlled Continue atorvastatin We will recheck blood work in 6 months

## 2019-03-06 NOTE — Assessment & Plan Note (Signed)
Not experiencing any chest pain, palpitations or shortness of breath Continue aspirin 81 mg daily and atorvastatin We will defer blood work until she comes in for her next visit-blood work from 6 months ago reviewed and is stable

## 2019-03-06 NOTE — Assessment & Plan Note (Signed)
Chronic lower back pain Continue Mobic daily if needed.  Discussed that if she does not need this on a daily basis it is better not to take it Can take methocarbamol as needed Stressed the importance of regular exercise

## 2019-03-06 NOTE — Assessment & Plan Note (Signed)
Cough is residual from recent upper respiratory infection Can continue over-the-counter cold medications I will send in Hycodan cough syrup for her to use at night for during the day as long as she is not driving Call if symptoms do not improve

## 2019-03-06 NOTE — Assessment & Plan Note (Signed)
Diet controlled diabetes Last A1c 5.8% Will defer blood work until her next visit Discussed continuing compliance with a diabetic diet and trying to keep up with regular exercise during this time

## 2019-03-06 NOTE — Assessment & Plan Note (Signed)
Clinically euthyroid Continue current dose of levothyroxine 

## 2019-03-06 NOTE — Assessment & Plan Note (Signed)
BP Readings from Last 3 Encounters:  07/25/18 132/76  07/25/18 132/76  05/23/18 120/74    Blood pressure typically well controlled We will continue her current medication at current doses given her stability Ideally check blood pressure if she can We will defer blood work given the stability of her blood work-to repeat in 6 months

## 2019-03-07 ENCOUNTER — Telehealth: Payer: Self-pay

## 2019-03-07 MED ORDER — PROMETHAZINE-DM 6.25-15 MG/5ML PO SYRP: 5.0000 mL | ORAL_SOLUTION | Freq: Four times a day (QID) | ORAL | 0 refills | Status: DC | PRN

## 2019-03-07 NOTE — Telephone Encounter (Signed)
Copied from CRM [id number]. Topic: General - Other >> Mar 06, 2019  4:02 PM Jaquita Rector A wrote: Reason for CRM: Patient called to say that during her virtual visit today Dr Lawerance Bach told her that she would call in some medication for her to the pharmacy but she went to the pharmacy and nothing was there. Per patient it was supposed to be a medication for cough. Please advise

## 2019-03-07 NOTE — Telephone Encounter (Signed)
Received fax from pharmacy that hydromet cough syrup was not covered. Please send and alternative if possible to [facility] on [address].

## 2019-03-07 NOTE — Telephone Encounter (Signed)
Different syrup sent.

## 2019-03-07 NOTE — Telephone Encounter (Signed)
Rx has been sent  

## 2019-03-11 ENCOUNTER — Telehealth: Payer: Self-pay | Admitting: Internal Medicine

## 2019-03-11 NOTE — Telephone Encounter (Signed)
Returned call to patient.  Questioned if Dr. Lawerance Bach intended for her to use both cough syrups; Hydrocodone-Homatropine and Promethazine- Dextromethorphan?  Noted that the pharmacy faxed a request to PCP on 3/26, with request for alternative to the Hydrocodone- Homatropine, as insurance would not cover the cost.  Advised pt. that this was the reason the Promethazine-Dextromethorphan was ordered.  The patient stated she picked up both medications, and paid for them out of pocket.  Advised pt. to only take one of the cough medication prescriptions.  Also, advised against driving with either medication, due to side effects of drowsiness, and/or impaired thinking and reactions.  Pt. Verb. Understanding.  Stated she will only take the Hydrocodone cough syrup at night, and will put the Promethazine cough syrup away, in case she needs it in the future.

## 2019-03-11 NOTE — Telephone Encounter (Signed)
Patient is requesting a phone call to discuss the cough med she was prescribed.   Please call [phone]

## 2019-03-28 ENCOUNTER — Other Ambulatory Visit: Payer: Self-pay | Admitting: Internal Medicine

## 2019-07-15 ENCOUNTER — Other Ambulatory Visit: Payer: Self-pay | Admitting: Internal Medicine

## 2019-07-24 ENCOUNTER — Other Ambulatory Visit: Payer: Self-pay | Admitting: Internal Medicine

## 2019-08-12 ENCOUNTER — Ambulatory Visit (INDEPENDENT_AMBULATORY_CARE_PROVIDER_SITE_OTHER): Payer: Medicare Other | Admitting: *Deleted

## 2019-08-12 DIAGNOSIS — Z0000 Encounter for general adult medical examination without abnormal findings: Secondary | ICD-10-CM | POA: Diagnosis not present

## 2019-08-12 NOTE — Progress Notes (Addendum)
Subjective:   Gloria [name] is a 68 y.o. female who presents for Medicare Annual (Subsequent) preventive examination. I connected with patient by a telephone and verified that I am speaking with the correct person using two identifiers. Patient stated full name and DOB. Patient gave permission to continue with telephonic visit. Patient's location was at home and Nurse's location was at [facility] office.   Review of Systems:  Home Safety/Smoke Alarms: Feels safe in home. Smoke alarms in place.  Living environment; residence and Firearm Safety: 2-story house. Lives with alone, no needs for DME, good support system Seat Belt Safety/Bike Helmet: Wears seat belt.     Objective:     Vitals: There were no vitals taken for this visit.  There is no height or weight on file to calculate BMI.  Advanced Directives 08/12/2019 07/25/2018 12/12/2016  Does Patient Have a Medical Advance Directive? Yes Yes No  Type of Estate agentAdvance Directive Healthcare Power of [place];Living will Healthcare Power of [place];Living will -  Copy of Healthcare Power of Attorney in Chart? No - copy requested No - copy requested -    Tobacco Social History   Tobacco Use  Smoking Status Never Smoker  Smokeless Tobacco Never Used     Counseling given: Not Answered   Past Medical History:  Diagnosis Date  . Alopecia areata 10/2010 onset  . Diabetes mellitus, type 2 (H[facility])   . Dyslipidemia   . Hypertension   . Hypothyroid   . Myasthenia gravis    Past Surgical History:  Procedure Laterality Date  . ABDOMINAL HYSTERECTOMY  1990   Family History  Adopted: Yes  Problem Relation Age of Onset  . Breast cancer Neg Hx    Social History   Socioeconomic History  . Marital status: Divorced    Spouse name: Not on file  . Number of children: 3  . Years of education: Not on file  . Highest education level: Not on file  Occupational History  . Occupation: retired  Engineer, productionocial Needs  . Financial resource strain: Not hard  at all  . Food insecurity    Worry: Never true    Inability: Never true  . Transportation needs    Medical: No    Non-medical: No  Tobacco Use  . Smoking status: Never Smoker  . Smokeless tobacco: Never Used  Substance and Sexual Activity  . Alcohol use: No  . Drug use: Never  . Sexual activity: Never  Lifestyle  . Physical activity    Days per week: 3 days    Minutes per session: 40 min  . Stress: Not at all  Relationships  . Social connections    Talks on phone: More than three times a week    Gets together: More than three times a week    Attends religious service: Never    Active member of club or organization: Patient refused    Attends meetings of clubs or organizations: More than 4 times per year    Relationship status: Divorced  Other Topics Concern  . Not on file  Social History Narrative  . Not on file    Outpatient Encounter Medications as of 08/12/2019  Medication Sig  . amLODipine (NORVASC) 5 MG tablet TAKE 1 TABLET(5 MG) BY MOUTH DAILY  . atorvastatin (LIPITOR) 10 MG tablet TAKE 1 TABLET BY MOUTH DAILY AT 6PM  . diclofenac sodium (VOLTAREN) 1 % GEL Apply 4 g topically 4 (four) times daily.  Marland Kitchen. levothyroxine (SYNTHROID) 100 MCG tablet TAKE 1  TABLET(100 MCG) BY MOUTH DAILY  . losartan (COZAAR) 100 MG tablet TAKE 1 TABLET(100 MG) BY MOUTH DAILY  . meloxicam (MOBIC) 15 MG tablet TAKE 1 TABLET(15 MG) BY MOUTH DAILY  . methocarbamol (ROBAXIN) 500 MG tablet TAKE 1 TABLET(500 MG) BY MOUTH FOUR TIMES DAILY  . pyridostigmine (MESTINON) 60 MG tablet Take 60 mg by mouth 3 (three) times daily. Per Neurologist   . ST JOSEPH ASPIRIN PO Take 81 mg by mouth daily.  Marland Kitchen triamcinolone cream (KENALOG) 0.1 % APPLY EXTERNALLY TO THE AFFECTED AREA TWICE DAILY  . [DISCONTINUED] promethazine-dextromethorphan (PROMETHAZINE-DM) 6.25-15 MG/5ML syrup Take 5 mLs by mouth 4 (four) times daily as needed for cough. (Patient not taking: Reported on 08/12/2019)   No facility-administered  encounter medications on file as of 08/12/2019.     Activities of Daily Living In your present state of health, do you have any difficulty performing the following activities: 08/12/2019  Hearing? N  Vision? N  Difficulty concentrating or making decisions? N  Walking or climbing stairs? N  Dressing or bathing? N  Doing errands, shopping? N  Preparing Food and eating ? N  Using the Toilet? N  In the past six months, have you accidently leaked urine? N  Do you have problems with loss of bowel control? N  Managing your Medications? N  Housekeeping or managing your Housekeeping? N  Some recent data might be hidden    Patient Care Team: Pincus Sanes, MD as PCP - General (Internal Medicine) Caress, Fayrene Fearing, MD (Neurology) Helane Gunther, DPM (Podiatry) Janalyn Harder, MD (Dermatology) Vida Rigger, MD (Gastroenterology) Sallye Lat, MD as Consulting Physician (Ophthalmology)    Assessment:   This is a routine wellness examination for Gloria [name]. Physical assessment deferred to PCP.  Exercise Activities and Dietary recommendations Current Exercise Habits: Home exercise routine, Type of exercise: walking, Time (Minutes): 40, Frequency (Times/Week): 3, Weekly Exercise (Minutes/Week): 120, Intensity: Mild, Exercise limited by: None identified  Diet (meal preparation, eat out, water intake, caffeinated beverages, dairy products, fruits and vegetables): in general, a "healthy" diet  , well balanced. eats a variety of fruits and vegetables daily, limits salt, fat/cholesterol, sugar,carbohydrates,caffeine, drinks 6-8 glasses of water daily.  Goals    . Patient Stated     Stay as healthy and as independent as possible. Continue eat healthy, exercise, enjoy life and love my family.       Fall Risk Fall Risk  08/12/2019 07/25/2018 01/24/2018 11/16/2017 06/22/2016  Falls in the past year? 0 No No No No  Comment - - - Emmi Telephone Survey: data to providers prior to load -  Number falls in  past yr: 0 - - - -  Injury with Fall? 0 - - - -   Depression Screen PHQ 2/9 Scores 08/12/2019 07/25/2018 01/24/2018 06/22/2016  PHQ - 2 Score 0 0 0 0     Cognitive Function       Ad8 score reviewed for issues:  Issues making decisions: no  Less interest in hobbies / activities: no  Repeats questions, stories (family complaining): no  Trouble using ordinary gadgets (microwave, computer, phone):no  Forgets the month or year: no  Mismanaging finances: no  Remembering appts: no  Daily problems with thinking and/or memory: no Ad8 score is= 0  Immunization History  Administered Date(s) Administered  . Influenza Split 09/05/2012  . Influenza,inj,Quad PF,6+ Mos 11/28/2013, 10/17/2014  . Pneumococcal Polysaccharide-23 09/05/2012  . Td 12/12/2009   Screening Tests Health Maintenance  Topic Date Due  . PNA  vac Low Risk Adult (1 of 2 - PCV13) 07/26/2016  . OPHTHALMOLOGY EXAM  06/29/2018  . FOOT EXAM  01/24/2019  . HEMOGLOBIN A1C  01/25/2019  . INFLUENZA VACCINE  07/13/2019  . TETANUS/TDAP  12/13/2019  . MAMMOGRAM  11/01/2020  . COLONOSCOPY  09/04/2021  . DEXA SCAN  02/01/2022  . Hepatitis C Screening  Completed      Plan:    Reviewed health maintenance screenings with patient today and relevant education, vaccines, and/or referrals were provided.   Continue to eat heart healthy diet (full of fruits, vegetables, whole grains, lean protein, water--limit salt, fat, and sugar intake) and increase physical activity as tolerated.  I have personally reviewed and noted the following in the patient's chart:   . Medical and social history . Use of alcohol, tobacco or illicit drugs  . Current medications and supplements . Functional ability and status . Nutritional status . Physical activity . Advanced directives . List of other physicians . Screenings to include cognitive, depression, and falls . Referrals and appointments  In addition, I have reviewed and discussed with  patient certain preventive protocols, quality metrics, and best practice recommendations. A written personalized care plan for preventive services as well as general preventive health recommendations were provided to patient.     Michiel Cowboy, RN  08/12/2019   Medical screening examination/treatment/procedure(s) were performed by non-physician practitioner and as supervising physician I was immediately available for consultation/collaboration. I agree with above. Binnie Rail, MD

## 2019-08-30 ENCOUNTER — Other Ambulatory Visit: Payer: Self-pay | Admitting: Internal Medicine

## 2019-10-15 ENCOUNTER — Other Ambulatory Visit: Payer: Self-pay | Admitting: Internal Medicine

## 2019-10-15 DIAGNOSIS — Z1231 Encounter for screening mammogram for malignant neoplasm of breast: Secondary | ICD-10-CM

## 2019-10-21 ENCOUNTER — Other Ambulatory Visit: Payer: Self-pay | Admitting: Internal Medicine

## 2019-11-04 ENCOUNTER — Other Ambulatory Visit: Payer: Self-pay | Admitting: Internal Medicine

## 2019-11-06 ENCOUNTER — Other Ambulatory Visit: Payer: Self-pay

## 2019-11-18 ENCOUNTER — Other Ambulatory Visit: Payer: Self-pay | Admitting: Internal Medicine

## 2019-12-04 ENCOUNTER — Ambulatory Visit: Payer: Medicare Other

## 2019-12-16 ENCOUNTER — Other Ambulatory Visit: Payer: Self-pay

## 2019-12-16 ENCOUNTER — Ambulatory Visit
Admission: RE | Admit: 2019-12-16 | Discharge: 2019-12-16 | Disposition: A | Discharge status: Home / Self Care | Payer: Medicare Other | Source: Ambulatory Visit | Attending: Internal Medicine | Admitting: Internal Medicine

## 2019-12-16 ENCOUNTER — Other Ambulatory Visit: Payer: Self-pay | Admitting: Internal Medicine

## 2019-12-16 DIAGNOSIS — Z1231 Encounter for screening mammogram for malignant neoplasm of breast: Secondary | ICD-10-CM | POA: Diagnosis not present

## 2019-12-17 ENCOUNTER — Other Ambulatory Visit: Payer: Self-pay | Admitting: Internal Medicine

## 2019-12-17 DIAGNOSIS — R928 Other abnormal and inconclusive findings on diagnostic imaging of breast: Secondary | ICD-10-CM

## 2019-12-20 ENCOUNTER — Other Ambulatory Visit: Payer: Self-pay

## 2019-12-20 ENCOUNTER — Ambulatory Visit
Admission: RE | Admit: 2019-12-20 | Discharge: 2019-12-20 | Disposition: A | Discharge status: Home / Self Care | Payer: Medicare HMO | Source: Ambulatory Visit | Attending: Internal Medicine | Admitting: Internal Medicine

## 2019-12-20 ENCOUNTER — Ambulatory Visit: Payer: Medicare Other

## 2019-12-20 DIAGNOSIS — R928 Other abnormal and inconclusive findings on diagnostic imaging of breast: Secondary | ICD-10-CM | POA: Diagnosis not present

## 2020-01-05 NOTE — Progress Notes (Signed)
Subjective:    Patient ID: Gloria [name], female    DOB: 1951-04-16, 69 y.o.   MRN: [id number]  HPI The patient is here for an acute visit for numbness, cold and discoloration in hands.  It is both hands but her right hand is worse.  Her fingers are freezing all the time and her fingertips are blue.  She wondered if it was related to her CTS surgery (R 20 yrs ago) and (L 15 yrs ago).   The symptoms just started this year.  She denies changes in medication.  Wearing gloves does help.  Her finger tips are numb.  They do not hurt.  They feel better when her body is warm.        Medications and allergies reviewed with patient and updated if appropriate.  Patient Active Problem List   Diagnosis Date Noted  . Leg cramping 07/25/2018  . Left wrist tendinitis 04/30/2018  . Left-sided low back pain without sciatica 04/30/2018  . Hand numbness 01/24/2018  . Hair loss 01/24/2018  . Lumbar radiculopathy 12/23/2016  . Plantar fasciitis of right foot 06/22/2016  . Leg weakness, bilateral 06/22/2016  . Cough 12/19/2014  . Carpal tunnel syndrome of right wrist 03/07/2013  . Myasthenia gravis without exacerbation ([place]) 12/31/2010  . ALOPECIA 12/31/2010  . Hypothyroidism 12/29/2010  . Type 2 diabetes, diet controlled ([place]) 12/29/2010  . Hyperlipidemia 12/29/2010  . Essential hypertension 12/29/2010  . Coronary atherosclerosis 12/29/2010  . OSTEOARTHRITIS 12/29/2010    Current Outpatient Medications on File Prior to Visit  Medication Sig Dispense Refill  . amLODipine (NORVASC) 5 MG tablet TAKE 1 TABLET(5 MG) BY MOUTH DAILY 90 tablet 1  . atorvastatin (LIPITOR) 10 MG tablet TAKE 1 TABLET BY MOUTH DAILY AT 6 PM 90 tablet 1  . diclofenac sodium (VOLTAREN) 1 % GEL Apply 4 g topically 4 (four) times daily. 100 g 5  . levothyroxine (SYNTHROID) 100 MCG tablet TAKE 1 TABLET BY MOUTH DAILY 30 tablet 5  . losartan (COZAAR) 100 MG tablet TAKE 1 TABLET(100 MG) BY MOUTH DAILY 90 tablet 1  .  meloxicam (MOBIC) 15 MG tablet TAKE 1 TABLET(15 MG) BY MOUTH DAILY 90 tablet 1  . pyridostigmine (MESTINON) 60 MG tablet Take 60 mg by mouth 3 (three) times daily. Per Neurologist     . ST JOSEPH ASPIRIN PO Take 81 mg by mouth daily.    Marland Kitchen triamcinolone cream (KENALOG) 0.1 % APPLY EXTERNALLY TO THE AFFECTED AREA TWICE DAILY 30 g 0   No current facility-administered medications on file prior to visit.    Past Medical History:  Diagnosis Date  . Alopecia areata 10/2010 onset  . Diabetes mellitus, type 2 ([place])   . Dyslipidemia   . Hypertension   . Hypothyroid   . Myasthenia gravis     Past Surgical History:  Procedure Laterality Date  . ABDOMINAL HYSTERECTOMY  1990    Social History   Socioeconomic History  . Marital status: Divorced    Spouse name: Not on file  . Number of children: 3  . Years of education: Not on file  . Highest education level: Not on file  Occupational History  . Occupation: retired  Tobacco Use  . Smoking status: Never Smoker  . Smokeless tobacco: Never Used  Substance and Sexual Activity  . Alcohol use: No  . Drug use: Never  . Sexual activity: Never  Other Topics Concern  . Not on file  Social History Narrative  . Not on  file   Social Determinants of Health   Financial Resource Strain:   . Difficulty of Paying Living Expenses: Not on file  Food Insecurity:   . Worried About Programme researcher, broadcasting/film/video in the Last Year: Not on file  . Ran Out of Food in the Last Year: Not on file  Transportation Needs:   . Lack of Transportation (Medical): Not on file  . Lack of Transportation (Non-Medical): Not on file  Physical Activity: Unknown  . Days of Exercise per Week: 3 days  . Minutes of Exercise per Session: Not on file  Stress:   . Feeling of Stress : Not on file  Social Connections:   . Frequency of Communication with Friends and Family: Not on file  . Frequency of Social Gatherings with Friends and Family: Not on file  . Attends Religious  Services: Not on file  . Active Member of Clubs or Organizations: Not on file  . Attends Banker Meetings: Not on file  . Marital Status: Not on file    Family History  Adopted: Yes  Problem Relation Age of Onset  . Breast cancer Neg Hx     Review of Systems     Objective:   Vitals:   01/06/20 1020  BP: (!) 148/72  Resp: 16  Temp: 98.2 F (36.8 C)   BP Readings from Last 3 Encounters:  01/06/20 (!) 148/72  07/25/18 132/76  07/25/18 132/76   Wt Readings from Last 3 Encounters:  01/06/20 169 lb 12.8 oz (77 kg)  07/25/18 165 lb (74.8 kg)  07/25/18 165 lb (74.8 kg)   Body mass index is 27.41 kg/m.   Physical Exam Constitutional:      General: She is not in acute distress.    Appearance: Normal appearance. She is not ill-appearing.  Cardiovascular:     Rate and Rhythm: Normal rate and regular rhythm.     Comments: 2+ radial and ulnar pulses bilaterally Pulmonary:     Effort: Pulmonary effort is normal.     Breath sounds: Normal breath sounds.  Skin:    General: Skin is warm and dry.     Comments: When I examined her hands she had been wearing gloves and her skin was normal color, there is no blue discoloration of the fingertips or nails.  Fingers were warm to touch  Neurological:     Mental Status: She is alert.     Sensory: No sensory deficit (Bilateral hands/fingers).            Assessment & Plan:    See Problem List for Assessment and Plan of chronic medical problems.    This visit occurred during the SARS-CoV-2 public health emergency.  Safety protocols were in place, including screening questions prior to the visit, additional usage of staff PPE, and extensive cleaning of exam room while observing appropriate contact time as indicated for disinfecting solutions.

## 2020-01-06 ENCOUNTER — Encounter: Payer: Self-pay | Admitting: Internal Medicine

## 2020-01-06 ENCOUNTER — Other Ambulatory Visit: Payer: Self-pay

## 2020-01-06 ENCOUNTER — Ambulatory Visit (INDEPENDENT_AMBULATORY_CARE_PROVIDER_SITE_OTHER): Payer: Medicare HMO | Admitting: Internal Medicine

## 2020-01-06 VITALS — BP 148/72 | Temp 98.2°F | Resp 16 | Ht 66.0 in | Wt 169.8 lb

## 2020-01-06 DIAGNOSIS — Z23 Encounter for immunization: Secondary | ICD-10-CM | POA: Diagnosis not present

## 2020-01-06 DIAGNOSIS — I7300 Raynaud's syndrome without gangrene: Secondary | ICD-10-CM | POA: Insufficient documentation

## 2020-01-06 NOTE — Assessment & Plan Note (Signed)
New Her symptoms are consistent with Raynaud's phenomenon Discussed etiology She is taking amlodipine now on losartan-continue Pulses are good and unlikely a vascular issue Discussed the importance of wearing gloves, keeping warm She is going to try a few natural things and let me know if that does not help

## 2020-01-06 NOTE — Patient Instructions (Addendum)
You received your prevnar pneumonia vaccine today.    Raynaud Phenomenon  Raynaud phenomenon is a condition that affects the blood vessels (arteries) that carry blood to your fingers and toes. The arteries that supply blood to your ears, lips, nipples, or the tip of your nose might also be affected. Raynaud phenomenon causes the arteries to become narrow temporarily (spasm). As a result, the flow of blood to the affected areas is temporarily decreased. This usually occurs in response to cold temperatures or stress. During an attack, the skin in the affected areas turns white, then blue, and finally red. You may also feel tingling or numbness in those areas. Attacks usually last for only a brief period, and then the blood flow to the area returns to normal. In most cases, Raynaud phenomenon does not cause serious health problems. What are the causes? In many cases, the cause of this condition is not known. The condition may occur on its own (primary Raynaud phenomenon) or may be associated with other diseases or factors (secondary Raynaud phenomenon). Possible causes may include:  Diseases or medical conditions that damage the arteries.  Injuries and repetitive actions that hurt the hands or feet.  Being exposed to certain chemicals.  Taking medicines that narrow the arteries.  Other medical conditions, such as lupus, scleroderma, rheumatoid arthritis, thyroid problems, blood disorders, Sjogren syndrome, or atherosclerosis. What increases the risk? The following factors may make you more likely to develop this condition:  Being 2-55 years old.  Being female.  Having a family history of Raynaud phenomenon.  Living in a cold climate.  Smoking. What are the signs or symptoms? Symptoms of this condition usually occur when you are exposed to cold temperatures or when you have emotional stress. The symptoms may last for a few minutes or up to several hours. They usually affect your  fingers but may also affect your toes, nipples, lips, ears, or the tip of your nose. Symptoms may include:  Changes in skin color. The skin in the affected areas will turn pale or white. The skin may then change from white to bluish to red as normal blood flow returns to the area.  Numbness, tingling, or pain in the affected areas. In severe cases, symptoms may include:  Skin sores.  Tissues decaying and dying (gangrene). How is this diagnosed? This condition may be diagnosed based on:  Your symptoms and medical history.  A physical exam. During the exam, you may be asked to put your hands in cold water to check for a reaction to cold temperature.  Tests, such as: ? Blood tests to check for other diseases or conditions. ? A test to check the movement of blood through your arteries and veins (vascular ultrasound). ? A test in which the skin at the base of your fingernail is examined under a microscope (nailfold capillaroscopy). How is this treated? Treatment for this condition often involves making lifestyle changes and taking steps to control your exposure to cold temperatures. For more severe cases, medicine (calcium channel blockers) may be used to improve blood flow. Surgery is sometimes done to block the nerves that control the affected arteries, but this is rare. Follow these instructions at home: Avoiding cold temperatures Take these steps to avoid exposure to cold:  If possible, stay indoors during cold weather.  When you go outside during cold weather, dress in layers and wear mittens, a hat, a scarf, and warm footwear.  Wear mittens or gloves when handling ice or frozen food.  Use holders for glasses or cans containing cold drinks.  Let warm water run for a while before taking a shower or bath.  Warm up the car before driving in cold weather. Lifestyle   If possible, avoid stressful and emotional situations. Try to find ways to manage your stress, such  as: ? Exercise. ? Yoga. ? Meditation. ? Biofeedback.  Do not use any products that contain nicotine or tobacco, such as cigarettes and e-cigarettes. If you need help quitting, ask your health care provider.  Avoid secondhand smoke.  Limit your use of caffeine. ? Switch to decaffeinated coffee, tea, and soda. ? Avoid chocolate.  Avoid vibrating tools and machinery. General instructions  Protect your hands and feet from injuries, cuts, or bruises.  Avoid wearing tight rings or wristbands.  Wear loose fitting socks and comfortable, roomy shoes.  Take over-the-counter and prescription medicines only as told by your health care provider. Contact a health care provider if:  Your discomfort becomes worse despite lifestyle changes.  You develop sores on your fingers or toes that do not heal.  Your fingers or toes turn black.  You have breaks in the skin on your fingers or toes.  You have a fever.  You have pain or swelling in your joints.  You have a rash.  Your symptoms occur on only one side of your body. Summary  Raynaud phenomenon is a condition that affects the arteries that carry blood to your fingers, toes, ears, lips, nipples, or the tip of your nose.  In many cases, the cause of this condition is not known.  Symptoms of this condition include changes in skin color, and numbness and tingling of the affected area.  Treatment for this condition includes lifestyle changes, reducing exposure to cold temperatures, and using medicines for severe cases of the condition.  Contact your health care provider if your condition worsens despite treatment. This information is not intended to replace advice given to you by your health care provider. Make sure you discuss any questions you have with your health care provider. Document Revised: 12/01/2017 Document Reviewed: 01/09/2017 Elsevier Patient Education  2020 [employer].

## 2020-01-07 ENCOUNTER — Ambulatory Visit: Payer: Self-pay

## 2020-01-07 NOTE — Telephone Encounter (Signed)
Pt. Triaged at Tammy's request.Pt. Reports she had the pneumonia vaccine yesterday and a few hours later began having lethargy, arm pain, body aches and chills. Feels "a little better today - I took some Tylenol." Will apply ice pack to her arm. States 5 years ago had the pneumonia vaccine and had a reaction then too. Wants to know what else can be done. Please advise pt.  Answer Assessment - Initial Assessment Questions 1. SYMPTOMS: "What is the main symptom?" (e.g., redness, swelling, pain)      Lethargy, body aches 2. ONSET: "When was the vaccine (shot) given?" "How much later did the Symptoms begin?" (e.g., hours, days ago)      Hours 3. SEVERITY: "How bad is it?"      Moderate 4. FEVER: "Is there a fever?" If so, ask: "What is it, how was it measured, and when did it start?"      Low grade last night 5. IMMUNIZATIONS GIVEN: "What shots have you recently received?"     Pneumonia 6. PAST REACTIONS: "Have you reacted to immunizations before?" If so, ask: "What happened?"     Yes- 5 years ago had pneumonia vaccine and reacted 7. OTHER SYMPTOMS: "Do you have any other symptoms?"     Arm pain, lethargy, achy  Protocols used: IMMUNIZATION REACTIONS-A-AH

## 2020-01-07 NOTE — Telephone Encounter (Signed)
I agree with nurses advice-Tylenol and ice packs should help.  If pain is severe enough she can take Advil as long she holds the meloxicam.  I would expect her symptoms to improve over the next couple of days.  She is completely done with pneumonia vaccine so she does not need to worry about having another one.  She should call if her symptoms do not continue to improve over the next 24 hours.

## 2020-01-07 NOTE — Telephone Encounter (Signed)
Pt aware of response below and expressed understanding.  

## 2020-01-17 ENCOUNTER — Telehealth: Payer: Self-pay

## 2020-01-17 NOTE — Telephone Encounter (Signed)
Patient calling and states that since she had the pneumonia vaccine, she has broken out in a rash in the injection site. Patient is worried about this. Would like a call to discuss. Has already spoken to triage regarding the vaccine reaction. CB#:[phone]

## 2020-01-17 NOTE — Telephone Encounter (Signed)
LVM for pt to call back in regards.  

## 2020-01-20 NOTE — Telephone Encounter (Signed)
Called to check on pt. Left 2nd VM to call back.

## 2020-01-21 NOTE — Telephone Encounter (Signed)
With that type of reaction it is still recommended by the CDC that you get the Covid vaccine.  A bad reaction to pneumonia vaccine does not necessarily increase your risk of a bad reaction to the Covid vaccine.

## 2020-01-21 NOTE — Telephone Encounter (Signed)
Pt had a pretty bad reaction to the 2nd pneumonia shot. She had red, swollen, and painful arm. Arm is now peeling. She is worried about the COVID vaccine. She has that scheduled for Thursday and wants to know what you think about her taking it since she has had a bad reaction to both pneumonia shots. Please advise.

## 2020-01-21 NOTE — Telephone Encounter (Signed)
Pt aware of response and expressed understanding.  

## 2020-02-06 ENCOUNTER — Encounter: Payer: Self-pay | Admitting: Internal Medicine

## 2020-02-06 ENCOUNTER — Other Ambulatory Visit: Payer: Self-pay

## 2020-02-06 ENCOUNTER — Ambulatory Visit (INDEPENDENT_AMBULATORY_CARE_PROVIDER_SITE_OTHER): Payer: Medicare HMO | Admitting: Internal Medicine

## 2020-02-06 VITALS — BP 140/84 | HR 67 | Temp 98.7°F | Resp 16 | Ht 66.0 in | Wt 157.0 lb

## 2020-02-06 DIAGNOSIS — M5416 Radiculopathy, lumbar region: Secondary | ICD-10-CM | POA: Diagnosis not present

## 2020-02-06 DIAGNOSIS — G7000 Myasthenia gravis without (acute) exacerbation: Secondary | ICD-10-CM

## 2020-02-06 DIAGNOSIS — E7849 Other hyperlipidemia: Secondary | ICD-10-CM | POA: Diagnosis not present

## 2020-02-06 DIAGNOSIS — E119 Type 2 diabetes mellitus without complications: Secondary | ICD-10-CM

## 2020-02-06 DIAGNOSIS — L299 Pruritus, unspecified: Secondary | ICD-10-CM

## 2020-02-06 DIAGNOSIS — E039 Hypothyroidism, unspecified: Secondary | ICD-10-CM | POA: Diagnosis not present

## 2020-02-06 DIAGNOSIS — I7300 Raynaud's syndrome without gangrene: Secondary | ICD-10-CM | POA: Diagnosis not present

## 2020-02-06 DIAGNOSIS — I2510 Atherosclerotic heart disease of native coronary artery without angina pectoris: Secondary | ICD-10-CM | POA: Diagnosis not present

## 2020-02-06 DIAGNOSIS — I10 Essential (primary) hypertension: Secondary | ICD-10-CM | POA: Diagnosis not present

## 2020-02-06 LAB — LIPID PANEL
Cholesterol: 150 mg/dL (ref 0–200)
HDL: 44.5 mg/dL (ref >39.00)
LDL Cholesterol: 90 mg/dL (ref 0–99)
NonHDL: 105.58
Total CHOL/HDL Ratio: 3
Triglycerides: 78 mg/dL (ref 0.0–149.0)
VLDL: 15.6 mg/dL (ref 0.0–40.0)

## 2020-02-06 LAB — COMPREHENSIVE METABOLIC PANEL
ALT: 11 U/L (ref 0–35)
AST: 19 U/L (ref 0–37)
Albumin: 4 g/dL (ref 3.5–5.2)
Alkaline Phosphatase: 73 U/L (ref 39–117)
BUN: 15 mg/dL (ref 6–23)
CO2: 30 mEq/L (ref 19–32)
Calcium: 9.6 mg/dL (ref 8.4–10.5)
Chloride: 103 mEq/L (ref 96–112)
Creatinine, Ser: 1.03 mg/dL (ref 0.40–1.20)
GFR: 64.38 mL/min (ref >60.00)
Glucose, Bld: 108 mg/dL — ABNORMAL HIGH (ref 70–99)
Potassium: 3.8 mEq/L (ref 3.5–5.1)
Sodium: 139 mEq/L (ref 135–145)
Total Bilirubin: 0.5 mg/dL (ref 0.2–1.2)
Total Protein: 7.6 g/dL (ref 6.0–8.3)

## 2020-02-06 LAB — CBC WITH DIFFERENTIAL/PLATELET
Basophils Absolute: 0 10*3/uL (ref 0.0–0.1)
Basophils Relative: 0.9 % (ref 0.0–3.0)
Eosinophils Absolute: 0.4 10*3/uL (ref 0.0–0.7)
Eosinophils Relative: 9.2 % — ABNORMAL HIGH (ref 0.0–5.0)
HCT: 37.3 % (ref 36.0–46.0)
Hemoglobin: 12.3 g/dL (ref 12.0–15.0)
Lymphocytes Relative: 30.4 % (ref 12.0–46.0)
Lymphs Abs: 1.3 10*3/uL (ref 0.7–4.0)
MCHC: 33 g/dL (ref 30.0–36.0)
MCV: 91.9 fl (ref 78.0–100.0)
Monocytes Absolute: 0.5 10*3/uL (ref 0.1–1.0)
Monocytes Relative: 11.1 % (ref 3.0–12.0)
Neutro Abs: 2.1 10*3/uL (ref 1.4–7.7)
Neutrophils Relative %: 48.4 % (ref 43.0–77.0)
Platelets: 178 10*3/uL (ref 150.0–400.0)
RBC: 4.05 Mil/uL (ref 3.87–5.11)
RDW: 13.8 % (ref 11.5–15.5)
WBC: 4.4 10*3/uL (ref 4.0–10.5)

## 2020-02-06 LAB — TSH: TSH: 0.36 u[IU]/mL (ref 0.35–4.50)

## 2020-02-06 NOTE — Progress Notes (Signed)
Subjective:    Patient ID: Gloria [name], female    DOB: March 22, 1951, 69 y.o.   MRN: [id number]  HPI The patient is here for follow up of their chronic medical problems, including CAD, hypertension, diabetes, hypothyroidism, hyperlipidemia, lower back pain, raynoud's  She is taking all of her medications as prescribed.   She is exercising regularly - 3-4 days a week.     She has one area on her back that itches all the time.  She denies rash.     Medications and allergies reviewed with patient and updated if appropriate.  Patient Active Problem List   Diagnosis Date Noted  . Raynaud's phenomenon 01/06/2020  . Leg cramping 07/25/2018  . Left wrist tendinitis 04/30/2018  . Left-sided low back pain without sciatica 04/30/2018  . Hand numbness 01/24/2018  . Hair loss 01/24/2018  . Lumbar radiculopathy 12/23/2016  . Plantar fasciitis of right foot 06/22/2016  . Leg weakness, bilateral 06/22/2016  . Cough 12/19/2014  . Carpal tunnel syndrome of right wrist 03/07/2013  . Myasthenia gravis without exacerbation (HCC) 12/31/2010  . ALOPECIA 12/31/2010  . Hypothyroidism 12/29/2010  . Type 2 diabetes, diet controlled (HCC) 12/29/2010  . Hyperlipidemia 12/29/2010  . Essential hypertension 12/29/2010  . Coronary atherosclerosis 12/29/2010  . OSTEOARTHRITIS 12/29/2010    Current Outpatient Medications on File Prior to Visit  Medication Sig Dispense Refill  . amLODipine (NORVASC) 5 MG tablet TAKE 1 TABLET(5 MG) BY MOUTH DAILY 90 tablet 1  . atorvastatin (LIPITOR) 10 MG tablet TAKE 1 TABLET BY MOUTH DAILY AT 6 PM 90 tablet 1  . diclofenac sodium (VOLTAREN) 1 % GEL Apply 4 g topically 4 (four) times daily. 100 g 5  . levothyroxine (SYNTHROID) 100 MCG tablet TAKE 1 TABLET BY MOUTH DAILY 30 tablet 5  . losartan (COZAAR) 100 MG tablet TAKE 1 TABLET(100 MG) BY MOUTH DAILY 90 tablet 1  . meloxicam (MOBIC) 15 MG tablet TAKE 1 TABLET(15 MG) BY MOUTH DAILY 90 tablet 1  . pyridostigmine  (MESTINON) 60 MG tablet Take 60 mg by mouth 3 (three) times daily. Per Neurologist     . ST JOSEPH ASPIRIN PO Take 81 mg by mouth daily.    Marland Kitchen triamcinolone cream (KENALOG) 0.1 % APPLY EXTERNALLY TO THE AFFECTED AREA TWICE DAILY 30 g 0   No current facility-administered medications on file prior to visit.    Past Medical History:  Diagnosis Date  . Alopecia areata 10/2010 onset  . Diabetes mellitus, type 2 (HCC)   . Dyslipidemia   . Hypertension   . Hypothyroid   . Myasthenia gravis     Past Surgical History:  Procedure Laterality Date  . ABDOMINAL HYSTERECTOMY  1990    Social History   Socioeconomic History  . Marital status: Divorced    Spouse name: Not on file  . Number of children: 3  . Years of education: Not on file  . Highest education level: Not on file  Occupational History  . Occupation: retired  Tobacco Use  . Smoking status: Never Smoker  . Smokeless tobacco: Never Used  Substance and Sexual Activity  . Alcohol use: No  . Drug use: Never  . Sexual activity: Never  Other Topics Concern  . Not on file  Social History Narrative  . Not on file   Social Determinants of Health   Financial Resource Strain:   . Difficulty of Paying Living Expenses: Not on file  Food Insecurity:   . Worried About Radiation protection practitioner  of Food in the Last Year: Not on file  . Ran Out of Food in the Last Year: Not on file  Transportation Needs:   . Lack of Transportation (Medical): Not on file  . Lack of Transportation (Non-Medical): Not on file  Physical Activity: Unknown  . Days of Exercise per Week: 3 days  . Minutes of Exercise per Session: Not on file  Stress:   . Feeling of Stress : Not on file  Social Connections:   . Frequency of Communication with Friends and Family: Not on file  . Frequency of Social Gatherings with Friends and Family: Not on file  . Attends Religious Services: Not on file  . Active Member of Clubs or Organizations: Not on file  . Attends Theatre manager Meetings: Not on file  . Marital Status: Not on file    Family History  Adopted: Yes  Problem Relation Age of Onset  . Breast cancer Neg Hx     Review of Systems  Constitutional: Negative for fever.  Respiratory: Negative for cough, shortness of breath and wheezing.   Cardiovascular: Negative for chest pain, palpitations and leg swelling.  Skin: Negative for rash.  Neurological: Positive for headaches (slight at times). Negative for light-headedness.       Objective:   Vitals:   02/06/20 1110  BP: 140/84  Pulse: 67  Resp: 16  Temp: 98.7 F (37.1 C)  SpO2: 96%   BP Readings from Last 3 Encounters:  02/06/20 140/84  01/06/20 (!) 148/72  07/25/18 132/76   Wt Readings from Last 3 Encounters:  02/06/20 157 lb (71.2 kg)  01/06/20 169 lb 12.8 oz (77 kg)  07/25/18 165 lb (74.8 kg)   Body mass index is 25.34 kg/m.   Physical Exam    Constitutional: Appears well-developed and well-nourished. No distress.  HENT:  Head: Normocephalic and atraumatic.  Neck: Neck supple. No tracheal deviation present. No thyromegaly present.  No cervical lymphadenopathy Cardiovascular: Normal rate, regular rhythm and normal heart sounds.   No murmur heard. No carotid bruit .  No edema Pulmonary/Chest: Effort normal and breath sounds normal. No respiratory distress. No has no wheezes. No rales.  Skin: Skin is warm and dry. Not diaphoretic. No rash Psychiatric: Normal mood and affect. Behavior is normal.      Assessment & Plan:    See Problem List for Assessment and Plan of chronic medical problems.    This visit occurred during the SARS-CoV-2 public health emergency.  Safety protocols were in place, including screening questions prior to the visit, additional usage of staff PPE, and extensive cleaning of exam room while observing appropriate contact time as indicated for disinfecting solutions.

## 2020-02-06 NOTE — Assessment & Plan Note (Signed)
Chronic Controlled, stable

## 2020-02-06 NOTE — Assessment & Plan Note (Signed)
Somewhat chronic She has had itching in one area of her back for a while No rash or skin changes Possibly related to nerve

## 2020-02-06 NOTE — Patient Instructions (Addendum)
°  Blood work was ordered.   ° ° °Medications reviewed and updated.  Changes include :   none ° ° ° °Please followup in 6 months ° ° °

## 2020-02-06 NOTE — Assessment & Plan Note (Signed)
Chronic Check lipid panel  Continue daily statin Regular exercise and healthy diet encouraged  

## 2020-02-06 NOTE — Assessment & Plan Note (Signed)
Chronic Diet controlled She is very compliant with a diabetic diet and is exercising regularly Continue healthy lifestyle Check A1c

## 2020-02-06 NOTE — Assessment & Plan Note (Signed)
Chronic No symptoms consistent with angina Continue aspirin 81 mg, atorvastatin and BP medications Continue healthy diet and regular exercise CBC, CMP, TSH, lipids

## 2020-02-06 NOTE — Assessment & Plan Note (Signed)
Chronic BP overall controlled Current regimen effective and well tolerated Continue current medications at current doses cmp  

## 2020-02-06 NOTE — Assessment & Plan Note (Signed)
Chronic  Clinically euthyroid Check tsh  Titrate med dose if needed  

## 2020-02-06 NOTE — Assessment & Plan Note (Signed)
Chronic lower back pain Takes meloxicam as needed Continue

## 2020-02-06 NOTE — Assessment & Plan Note (Signed)
She is taking efforts to keep her hands and feet warm Continue symptomatic treatment

## 2020-02-07 LAB — HEMOGLOBIN A1C: Hgb A1c MFr Bld: 5.6 % (ref 4.6–6.5)

## 2020-02-22 ENCOUNTER — Other Ambulatory Visit: Payer: Self-pay | Admitting: Internal Medicine

## 2020-05-05 ENCOUNTER — Other Ambulatory Visit: Payer: Self-pay | Admitting: Internal Medicine

## 2020-05-06 ENCOUNTER — Other Ambulatory Visit: Payer: Self-pay | Admitting: Internal Medicine

## 2020-07-07 DIAGNOSIS — H2513 Age-related nuclear cataract, bilateral: Secondary | ICD-10-CM | POA: Diagnosis not present

## 2020-07-07 DIAGNOSIS — E1136 Type 2 diabetes mellitus with diabetic cataract: Secondary | ICD-10-CM | POA: Diagnosis not present

## 2020-07-08 NOTE — Progress Notes (Signed)
Subjective:    Patient ID: Gloria [name], female    DOB: Dec 24, 1950, 69 y.o.   MRN: [id number]  HPI The patient is here for an acute visit.   She has been in pain the past couple of weeks.  She has has lower back pain and pain down her left leg.  She has spasms in her leg.  She has tried a heating pad and it helps a little.  She feels pain in her left lower back and down her left leg on the lateral aspect to the ankle.  The spasms are bad in the left lateral lower leg.  She does have a history of left-sided sciatica in the past.  She does not know what she did to cause this.  She also has left arm pain and wrist pain.  She denies much neck pain.  She thinks the left arm pain is from working out. She tried methocarbamol and that helped the arm.  Since taking the medication she has been able to move the arm more.   She denies N/T in the leg or arm.  She is taking meloxicam daily.        Medications and allergies reviewed with patient and updated if appropriate.  Patient Active Problem List   Diagnosis Date Noted  . Pruritus 02/06/2020  . Raynaud's phenomenon 01/06/2020  . Leg cramping 07/25/2018  . Left wrist tendinitis 04/30/2018  . Left-sided low back pain without sciatica 04/30/2018  . Hand numbness 01/24/2018  . Lumbar radiculopathy 12/23/2016  . Plantar fasciitis of right foot 06/22/2016  . Leg weakness, bilateral 06/22/2016  . Cough 12/19/2014  . Carpal tunnel syndrome of right wrist 03/07/2013  . Myasthenia gravis without exacerbation (HCC) 12/31/2010  . ALOPECIA 12/31/2010  . Hypothyroidism 12/29/2010  . Type 2 diabetes, diet controlled (HCC) 12/29/2010  . Hyperlipidemia 12/29/2010  . Essential hypertension 12/29/2010  . Coronary atherosclerosis 12/29/2010  . OSTEOARTHRITIS 12/29/2010    Current Outpatient Medications on File Prior to Visit  Medication Sig Dispense Refill  . amLODipine (NORVASC) 5 MG tablet TAKE 1 TABLET(5 MG) BY MOUTH DAILY 90 tablet 1   . atorvastatin (LIPITOR) 10 MG tablet TAKE 1 TABLET BY MOUTH DAILY AT 6 PM 90 tablet 0  . diclofenac sodium (VOLTAREN) 1 % GEL Apply 4 g topically 4 (four) times daily. 100 g 5  . levothyroxine (SYNTHROID) 100 MCG tablet TAKE 1 TABLET BY MOUTH DAILY 90 tablet 0  . losartan (COZAAR) 100 MG tablet TAKE 1 TABLET(100 MG) BY MOUTH DAILY 90 tablet 1  . meloxicam (MOBIC) 15 MG tablet TAKE 1 TABLET(15 MG) BY MOUTH DAILY 90 tablet 1  . pyridostigmine (MESTINON) 60 MG tablet Take 60 mg by mouth 3 (three) times daily. Per Neurologist     . ST JOSEPH ASPIRIN PO Take 81 mg by mouth daily.    Marland Kitchen triamcinolone cream (KENALOG) 0.1 % APPLY EXTERNALLY TO THE AFFECTED AREA TWICE DAILY 30 g 0   No current facility-administered medications on file prior to visit.    Past Medical History:  Diagnosis Date  . Alopecia areata 10/2010 onset  . Diabetes mellitus, type 2 (HCC)   . Dyslipidemia   . Hypertension   . Hypothyroid   . Myasthenia gravis     Past Surgical History:  Procedure Laterality Date  . ABDOMINAL HYSTERECTOMY  1990    Social History   Socioeconomic History  . Marital status: Divorced    Spouse name: Not on file  . Number  of children: 3  . Years of education: Not on file  . Highest education level: Not on file  Occupational History  . Occupation: retired  Tobacco Use  . Smoking status: Never Smoker  . Smokeless tobacco: Never Used  Vaping Use  . Vaping Use: Never used  Substance and Sexual Activity  . Alcohol use: No  . Drug use: Never  . Sexual activity: Never  Other Topics Concern  . Not on file  Social History Narrative  . Not on file   Social Determinants of Health   Financial Resource Strain:   . Difficulty of Paying Living Expenses:   Food Insecurity:   . Worried About Programme researcher, broadcasting/film/video in the Last Year:   . Barista in the Last Year:   Transportation Needs:   . Freight forwarder (Medical):   Marland Kitchen Lack of Transportation (Non-Medical):   Physical  Activity: Unknown  . Days of Exercise per Week: 3 days  . Minutes of Exercise per Session: Not on file  Stress:   . Feeling of Stress :   Social Connections:   . Frequency of Communication with Friends and Family:   . Frequency of Social Gatherings with Friends and Family:   . Attends Religious Services:   . Active Member of Clubs or Organizations:   . Attends Banker Meetings:   Marland Kitchen Marital Status:     Family History  Adopted: Yes  Problem Relation Age of Onset  . Breast cancer Neg Hx     Review of Systems  Constitutional: Negative for chills and fever.  Gastrointestinal:       No change in bowel  Genitourinary:       No change in bladder  Musculoskeletal: Positive for back pain and neck pain (minimal).  Neurological: Negative for weakness and numbness.       Objective:   Vitals:   07/09/20 0946  BP: (!) 146/84  Pulse: 56  Temp: 98.4 F (36.9 C)  SpO2: 99%   BP Readings from Last 3 Encounters:  07/09/20 (!) 146/84  02/06/20 140/84  01/06/20 (!) 148/72   Wt Readings from Last 3 Encounters:  07/09/20 164 lb (74.4 kg)  02/06/20 157 lb (71.2 kg)  01/06/20 169 lb 12.8 oz (77 kg)   Body mass index is 26.47 kg/m.   Physical Exam Constitutional:      General: She is not in acute distress.    Appearance: Normal appearance. She is not ill-appearing.  HENT:     Head: Normocephalic and atraumatic.  Musculoskeletal:        General: Tenderness (Tenderness left upper back associated with some muscle tightness.  No tenderness cervical spine or posterior neck.  No tenderness lumbar spine or left lower back with palpation) present. No deformity.     Right lower leg: No edema.     Left lower leg: No edema.     Comments: Decreased range of motion left arm due to pain  Skin:    General: Skin is warm and dry.     Findings: No rash.  Neurological:     General: No focal deficit present.     Mental Status: She is alert.     Sensory: No sensory deficit.      Motor: No weakness.            Assessment & Plan:    See Problem List for Assessment and Plan of chronic medical problems.    This visit occurred  during the SARS-CoV-2 public health emergency.  Safety protocols were in place, including screening questions prior to the visit, additional usage of staff PPE, and extensive cleaning of exam room while observing appropriate contact time as indicated for disinfecting solutions.

## 2020-07-09 ENCOUNTER — Other Ambulatory Visit: Payer: Self-pay

## 2020-07-09 ENCOUNTER — Ambulatory Visit (INDEPENDENT_AMBULATORY_CARE_PROVIDER_SITE_OTHER): Payer: Medicare HMO | Admitting: Internal Medicine

## 2020-07-09 ENCOUNTER — Encounter: Payer: Self-pay | Admitting: Internal Medicine

## 2020-07-09 VITALS — BP 146/84 | HR 56 | Temp 98.4°F | Ht 66.0 in | Wt 164.0 lb

## 2020-07-09 DIAGNOSIS — M545 Low back pain, unspecified: Secondary | ICD-10-CM

## 2020-07-09 DIAGNOSIS — M5412 Radiculopathy, cervical region: Secondary | ICD-10-CM | POA: Diagnosis not present

## 2020-07-09 MED ORDER — KETOROLAC TROMETHAMINE 60 MG/2ML IM SOLN
60.0000 mg | Freq: Once | INTRAMUSCULAR | Status: AC
  Administered 2020-07-09: 60 mg via INTRAMUSCULAR

## 2020-07-09 MED ORDER — METHYLPREDNISOLONE ACETATE 80 MG/ML IJ SUSP
80.0000 mg | Freq: Once | INTRAMUSCULAR | Status: AC
  Administered 2020-07-09: 80 mg via INTRAMUSCULAR

## 2020-07-09 MED ORDER — GABAPENTIN 100 MG PO CAPS: ORAL_CAPSULE | ORAL | 5 refills | Status: DC

## 2020-07-09 NOTE — Patient Instructions (Addendum)
You received a steroid injection today and a Toradol injection 60 mg.    Medications reviewed and updated.  Changes include :   Gabapentin 100 mg in the morning and 200 mg at bedtime.  This may cause drowsiness.  We can adjust this dose if needed.    Your prescription(s) have been submitted to your pharmacy. Please take as directed and contact our office if you believe you are having problem(s) with the medication(s).   Consider physical therapy.   Please call or return if there is no improvement in your symptoms.

## 2020-07-09 NOTE — Assessment & Plan Note (Signed)
Acute She has had this in the past and her symptoms today are similar consistent with lumbar radiculopathy Continue meloxicam-advised taking this with food Can continue methocarbamol to help with some of the muscle spasms Start gabapentin 100 mg in the morning and 200 mg at night.  Discussed this could cause some drowsiness.  Discussed that we can increase or decrease this dose depending on tolerance and pain level Depo-Medrol 80 mg IM x1 Toradol 60 mg IM x1 She deferred physical therapy She will call or return if her symptoms are not improving

## 2020-07-09 NOTE — Assessment & Plan Note (Signed)
Acute Denies much neck pain, but having pain down her left arm.  Feels this may be from working out Continue meloxicam-advised taking this with food Can continue methocarbamol, which has helped her arm Start gabapentin 100 mg in the morning and 200 mg at night.  Discussed this could cause some drowsiness.  Discussed that we can increase or decrease this dose depending on tolerance and pain level Depo-Medrol 80 mg IM x1 Toradol 60 mg IM x1 She deferred physical therapy She will call or return if her symptoms are not improving

## 2020-07-11 ENCOUNTER — Emergency Department (HOSPITAL_COMMUNITY)
Admission: EM | Admit: 2020-07-11 | Discharge: 2020-07-11 | Disposition: A | Discharge status: Home / Self Care | Payer: Medicare HMO | Attending: Emergency Medicine | Admitting: Emergency Medicine

## 2020-07-11 ENCOUNTER — Other Ambulatory Visit: Payer: Self-pay

## 2020-07-11 ENCOUNTER — Encounter (HOSPITAL_COMMUNITY): Payer: Self-pay | Admitting: Emergency Medicine

## 2020-07-11 DIAGNOSIS — E119 Type 2 diabetes mellitus without complications: Secondary | ICD-10-CM | POA: Diagnosis not present

## 2020-07-11 DIAGNOSIS — M545 Low back pain: Secondary | ICD-10-CM | POA: Diagnosis not present

## 2020-07-11 DIAGNOSIS — M62830 Muscle spasm of back: Secondary | ICD-10-CM | POA: Diagnosis not present

## 2020-07-11 DIAGNOSIS — Z79899 Other long term (current) drug therapy: Secondary | ICD-10-CM | POA: Diagnosis not present

## 2020-07-11 DIAGNOSIS — I10 Essential (primary) hypertension: Secondary | ICD-10-CM | POA: Insufficient documentation

## 2020-07-11 DIAGNOSIS — Z79890 Hormone replacement therapy (postmenopausal): Secondary | ICD-10-CM | POA: Insufficient documentation

## 2020-07-11 DIAGNOSIS — M549 Dorsalgia, unspecified: Secondary | ICD-10-CM | POA: Diagnosis present

## 2020-07-11 DIAGNOSIS — E039 Hypothyroidism, unspecified: Secondary | ICD-10-CM | POA: Insufficient documentation

## 2020-07-11 DIAGNOSIS — M5442 Lumbago with sciatica, left side: Secondary | ICD-10-CM

## 2020-07-11 MED ORDER — OXYCODONE-ACETAMINOPHEN 5-325 MG PO TABS
2.0000 | ORAL_TABLET | Freq: Once | ORAL | Status: AC
  Administered 2020-07-11: 2 via ORAL
  Filled 2020-07-11: qty 2

## 2020-07-11 MED ORDER — PREDNISONE 20 MG PO TABS: 40.0000 mg | ORAL_TABLET | Freq: Every day | ORAL | 0 refills | Status: DC

## 2020-07-11 MED ORDER — METHOCARBAMOL 500 MG PO TABS: 500.0000 mg | ORAL_TABLET | Freq: Two times a day (BID) | ORAL | 0 refills | Status: DC

## 2020-07-11 NOTE — ED Notes (Signed)
Pt able to stand with no assist and ambulate

## 2020-07-11 NOTE — ED Triage Notes (Signed)
Patient is complaining of lower back pain that is radiating to left hip and left leg. Patient states she has a pinched nerve. Patient states that it has been hurting for two weeks. Patient went to md on Thursday and got Neurontin.

## 2020-07-11 NOTE — ED Provider Notes (Signed)
[facility] Provider Note   CSN: [id number] Arrival date & time: 07/11/20  1959     History Chief Complaint  Patient presents with  . Back Pain    Gloria [name] is a 69 y.o. female.  Patient presents to the emergency department with a chief complaint of back pain.  She reports that she has been having back pain for the past several days.  She was seen by her PCP, who gave her a steroid injection, a muscle relaxer, and meloxicam.  She reports some improvement, but is still having significant pain radiating down the left leg.  She denies any bowel or bladder incontinence.  Denies any weakness.  She states that the pain does prevent her from walking very far.  She denies any other associated symptoms.  The history is provided by the patient. No language interpreter was used.       Past Medical History:  Diagnosis Date  . Alopecia areata 10/2010 onset  . Diabetes mellitus, type 2 (HCC)   . Dyslipidemia   . Hypertension   . Hypothyroid   . Myasthenia gravis     Patient Active Problem List   Diagnosis Date Noted  . Acute cervical radiculopathy 07/09/2020  . Pruritus 02/06/2020  . Raynaud's phenomenon 01/06/2020  . Leg cramping 07/25/2018  . Left wrist tendinitis 04/30/2018  . Left-sided low back pain without sciatica 04/30/2018  . Hand numbness 01/24/2018  . Lumbar radiculopathy 12/23/2016  . Plantar fasciitis of right foot 06/22/2016  . Leg weakness, bilateral 06/22/2016  . Cough 12/19/2014  . Carpal tunnel syndrome of right wrist 03/07/2013  . Myasthenia gravis without exacerbation (HCC) 12/31/2010  . ALOPECIA 12/31/2010  . Hypothyroidism 12/29/2010  . Type 2 diabetes, diet controlled (HCC) 12/29/2010  . Hyperlipidemia 12/29/2010  . Essential hypertension 12/29/2010  . Coronary atherosclerosis 12/29/2010  . OSTEOARTHRITIS 12/29/2010    Past Surgical History:  Procedure Laterality Date  . ABDOMINAL HYSTERECTOMY  1990      OB History   No obstetric history on file.     Family History  Adopted: Yes  Problem Relation Age of Onset  . Breast cancer Neg Hx     Social History   Tobacco Use  . Smoking status: Never Smoker  . Smokeless tobacco: Never Used  Vaping Use  . Vaping Use: Never used  Substance Use Topics  . Alcohol use: No  . Drug use: Never    Home Medications Prior to Admission medications   Medication Sig Start Date End Date Taking? Authorizing Provider  amLODipine (NORVASC) 5 MG tablet TAKE 1 TABLET(5 MG) BY MOUTH DAILY 02/24/20   Pincus Sanes, MD  atorvastatin (LIPITOR) 10 MG tablet TAKE 1 TABLET BY MOUTH DAILY AT 6 PM 05/06/20   Burns, Bobette Mo, MD  diclofenac sodium (VOLTAREN) 1 % GEL Apply 4 g topically 4 (four) times daily. 08/06/18   Pincus Sanes, MD  gabapentin (NEURONTIN) 100 MG capsule Take 100 mg in the morning and 200 mg at night 07/09/20   Pincus Sanes, MD  levothyroxine (SYNTHROID) 100 MCG tablet TAKE 1 TABLET BY MOUTH DAILY 05/05/20   Pincus Sanes, MD  losartan (COZAAR) 100 MG tablet TAKE 1 TABLET(100 MG) BY MOUTH DAILY 02/24/20   Pincus Sanes, MD  meloxicam (MOBIC) 15 MG tablet TAKE 1 TABLET(15 MG) BY MOUTH DAILY 02/24/20   Pincus Sanes, MD  pyridostigmine (MESTINON) 60 MG tablet Take 60 mg by mouth 3 (three) times daily.  Per Neurologist     [provider]  ST JOSEPH ASPIRIN PO Take 81 mg by mouth daily.    [provider]  triamcinolone cream (KENALOG) 0.1 % APPLY EXTERNALLY TO THE AFFECTED AREA TWICE DAILY 12/31/18   Pincus Sanes, MD    Allergies    Vicodin [hydrocodone-acetaminophen]  Review of Systems   Review of Systems  All other systems reviewed and are negative.   Physical Exam Updated Vital Signs BP (!) 152/83 (BP Location: Left Arm)   Pulse 58   Temp 98.6 F (37 C) (Oral)   Resp 18   Ht 5\' 6"  (1.676 m)   Wt 74.4 kg   SpO2 100%   BMI 26.47 kg/m   Physical Exam Physical Exam  Constitutional: Pt appears  well-developed and well-nourished. No distress.  HENT:  Head: Normocephalic and atraumatic.  Mouth/Throat: Oropharynx is clear and moist. No oropharyngeal exudate.  Eyes: Conjunctivae are normal.  Neck: Normal range of motion. Neck supple.  No meningismus Cardiovascular: Normal rate, regular rhythm and intact distal pulses.   Pulmonary/Chest: Effort normal and breath sounds normal. No respiratory distress. Pt has no wheezes.  Abdominal: Pt exhibits no distension Musculoskeletal:  Diffuse paraspinal muscle tenderness to palpation, no bony CTLS spine tenderness, deformity, step-off, or crepitus Lymphadenopathy: Pt has no cervical adenopathy.  Neurological: Pt is alert and oriented Speech is clear and goal oriented, follows commands Normal 5/5 strength in upper and lower extremities bilaterally including dorsiflexion and plantar flexion, strong and equal grip strength Sensation intact Great toe extension intact Moves extremities without ataxia, coordination intact Ankle and knee jerk reflexes intact and symmetrical  Normal gait Normal balance No Clonus Skin: Skin is warm and dry. No rash noted. Pt is not diaphoretic. No erythema.  Psychiatric: Pt has a normal mood and affect. Behavior is normal.  Nursing note and vitals reviewed.  ED Results / Procedures / Treatments   Labs (all labs ordered are listed, but only abnormal results are displayed) Labs Reviewed - No data to display  EKG None  Radiology No results found.  Procedures Procedures (including critical care time)  Medications Ordered in ED Medications  oxyCODONE-acetaminophen (PERCOCET/ROXICET) 5-325 MG per tablet 2 tablet (has no administration in time range)    ED Course  I have reviewed the triage vital signs and the nursing notes.  Pertinent labs & imaging results that were available during my care of the patient were reviewed by me and considered in my medical decision making (see chart for details).    MDM  Rules/Calculators/A&P                          Patient with back pain.    No neurological deficits and normal neuro exam.  Patient is ambulatory.  No loss of bowel or bladder control.  Doubt cauda equina.  Denies fever,  doubt epidural abscess or other lesion. Recommend back exercises, stretching, RICE, and will treat with a short course of prednisone, refill muscle relaxer, and give a dose of percocet in the ED.  Recommend stretching and considering massage or physical therapy.  Encouraged the patient that there could be a need for additional workup and/or imaging such as MRI, if the symptoms do not resolve. Patient advised that if the back pain does not resolve, or radiates, this could progress to more serious conditions and is encouraged to follow-up with PCP or orthopedics within 2 weeks.    Final Clinical Impression(s) /  ED Diagnoses Final diagnoses:  Muscle spasm of back  Bilateral low back pain with left-sided sciatica, unspecified chronicity    Rx / DC Orders ED Discharge Orders         Ordered    predniSONE (DELTASONE) 20 MG tablet  Daily     Discontinue  Reprint     07/11/20 2310    methocarbamol (ROBAXIN) 500 MG tablet  2 times daily     Discontinue  Reprint     07/11/20 2310           Roxy Horseman, PA-C 07/11/20 2312    Derwood Kaplan, MD 07/12/20 1544

## 2020-07-13 ENCOUNTER — Telehealth: Payer: Self-pay

## 2020-07-13 MED ORDER — METHOCARBAMOL 500 MG PO TABS: 500.0000 mg | ORAL_TABLET | Freq: Four times a day (QID) | ORAL | 1 refills | Status: DC | PRN

## 2020-07-13 NOTE — Telephone Encounter (Signed)
Left message for patient refill had been sent in.

## 2020-07-13 NOTE — Telephone Encounter (Signed)
Was speaking with patient and got disconnected.

## 2020-07-13 NOTE — Progress Notes (Signed)
Subjective:    Patient ID: Gloria [name], female    DOB: [birth year], 69 y.o.   MRN: [id number]  HPI The patient is here for an acute visit.  Her pain started two weeks ago.    She was here last week for left lower back pain radiating down the left leg.  She had spasms in her leg.  No N/T.    Was taking meloxicam, methocarbamol.  Given Toradol and steroid injection.  She started on gabapentin.     She went to the ED 7/31 for the same symptoms.  She was prescribed prednisone.  She is taking this now, but has not seen any improvement.   Her pain persists in the left lower back down her left leg. Her spasms are better.  The pain is 9-10/10.  She is just started to do some stretches.   She is now taking meloxicam, prednisone, methocarbamol and gabapentin.    He has a lump on her right hip - she had this from the shot last week.  She has iced it and it has gone done.     Her left arm symptoms have gotten much better since she was here last   Medications and allergies reviewed with patient and updated if appropriate.  Patient Active Problem List   Diagnosis Date Noted  . Acute cervical radiculopathy 07/09/2020  . Pruritus 02/06/2020  . Raynaud's phenomenon 01/06/2020  . Leg cramping 07/25/2018  . Left wrist tendinitis 04/30/2018  . Left-sided low back pain without sciatica 04/30/2018  . Hand numbness 01/24/2018  . Lumbar radiculopathy 12/23/2016  . Plantar fasciitis of right foot 06/22/2016  . Leg weakness, bilateral 06/22/2016  . Cough 12/19/2014  . Carpal tunnel syndrome of right wrist 03/07/2013  . Myasthenia gravis without exacerbation (HCC) 12/31/2010  . ALOPECIA 12/31/2010  . Hypothyroidism 12/29/2010  . Type 2 diabetes, diet controlled (HCC) 12/29/2010  . Hyperlipidemia 12/29/2010  . Essential hypertension 12/29/2010  . Coronary atherosclerosis 12/29/2010  . OSTEOARTHRITIS 12/29/2010    Current Outpatient Medications on File Prior to Visit  Medication  Sig Dispense Refill  . amLODipine (NORVASC) 5 MG tablet TAKE 1 TABLET(5 MG) BY MOUTH DAILY 90 tablet 1  . atorvastatin (LIPITOR) 10 MG tablet TAKE 1 TABLET BY MOUTH DAILY AT 6 PM 90 tablet 0  . diclofenac sodium (VOLTAREN) 1 % GEL Apply 4 g topically 4 (four) times daily. 100 g 5  . levothyroxine (SYNTHROID) 100 MCG tablet TAKE 1 TABLET BY MOUTH DAILY 90 tablet 0  . losartan (COZAAR) 100 MG tablet TAKE 1 TABLET(100 MG) BY MOUTH DAILY 90 tablet 1  . meloxicam (MOBIC) 15 MG tablet TAKE 1 TABLET(15 MG) BY MOUTH DAILY 90 tablet 1  . methocarbamol (ROBAXIN) 500 MG tablet Take 1 tablet (500 mg total) by mouth every 6 (six) hours as needed for muscle spasms. 40 tablet 1  . predniSONE (DELTASONE) 20 MG tablet Take 2 tablets (40 mg total) by mouth daily. 10 tablet 0  . pyridostigmine (MESTINON) 60 MG tablet Take 60 mg by mouth 3 (three) times daily. Per Neurologist     . ST JOSEPH ASPIRIN PO Take 81 mg by mouth daily.    Marland Kitchen triamcinolone cream (KENALOG) 0.1 % APPLY EXTERNALLY TO THE AFFECTED AREA TWICE DAILY 30 g 0   No current facility-administered medications on file prior to visit.    Past Medical History:  Diagnosis Date  . Alopecia areata 10/2010 onset  . Diabetes mellitus, type 2 (HCC)   .  Dyslipidemia   . Hypertension   . Hypothyroid   . Myasthenia gravis     Past Surgical History:  Procedure Laterality Date  . ABDOMINAL HYSTERECTOMY  1990    Social History   Socioeconomic History  . Marital status: Divorced    Spouse name: Not on file  . Number of children: 3  . Years of education: Not on file  . Highest education level: Not on file  Occupational History  . Occupation: retired  Tobacco Use  . Smoking status: Never Smoker  . Smokeless tobacco: Never Used  Vaping Use  . Vaping Use: Never used  Substance and Sexual Activity  . Alcohol use: No  . Drug use: Never  . Sexual activity: Never  Other Topics Concern  . Not on file  Social History Narrative  . Not on file    Social Determinants of Health   Financial Resource Strain:   . Difficulty of Paying Living Expenses:   Food Insecurity:   . Worried About Programme researcher, broadcasting/film/video in the Last Year:   . Barista in the Last Year:   Transportation Needs:   . Freight forwarder (Medical):   Marland Kitchen Lack of Transportation (Non-Medical):   Physical Activity: Unknown  . Days of Exercise per Week: 3 days  . Minutes of Exercise per Session: Not on file  Stress:   . Feeling of Stress :   Social Connections:   . Frequency of Communication with Friends and Family:   . Frequency of Social Gatherings with Friends and Family:   . Attends Religious Services:   . Active Member of Clubs or Organizations:   . Attends Banker Meetings:   Marland Kitchen Marital Status:     Family History  Adopted: Yes  Problem Relation Age of Onset  . Breast cancer Neg Hx     Review of Systems  Constitutional: Negative for fever.  Gastrointestinal:       No change in bowels  Genitourinary:       No change in urine bladder/incontinence  Musculoskeletal: Positive for back pain (Radiating down left leg) and myalgias (Muscle spasms in the left leg).  Neurological: Negative for weakness and numbness.       Objective:   Vitals:   07/14/20 1607  BP: (!) 146/72  Pulse: (!) 58  Temp: 98.1 F (36.7 C)  SpO2: 99%   BP Readings from Last 3 Encounters:  07/14/20 (!) 146/72  07/11/20 (!) 140/80  07/09/20 (!) 146/84   Wt Readings from Last 3 Encounters:  07/14/20 165 lb (74.8 kg)  07/11/20 164 lb (74.4 kg)  07/09/20 164 lb (74.4 kg)   Body mass index is 26.63 kg/m.   Physical Exam Constitutional:      General: She is not in acute distress.    Appearance: Normal appearance. She is not ill-appearing.  Musculoskeletal:     Right lower leg: No edema.     Left lower leg: No edema.     Comments: Tenderness left lower back.  No lumbar spine tenderness.  Increased pain with movement of left leg  Skin:    General:  Skin is warm and dry.  Neurological:     Mental Status: She is alert.     Sensory: No sensory deficit.     Motor: No weakness.     Gait: Gait abnormal (Walks with a limp).            Assessment & Plan:    See  Problem List for Assessment and Plan of chronic medical problems.    This visit occurred during the SARS-CoV-2 public health emergency.  Safety protocols were in place, including screening questions prior to the visit, additional usage of staff PPE, and extensive cleaning of exam room while observing appropriate contact time as indicated for disinfecting solutions.

## 2020-07-13 NOTE — Telephone Encounter (Signed)
Yes, sent

## 2020-07-13 NOTE — Telephone Encounter (Signed)
New message   Seen in the emergency room on Saturday asking can medication be change back to  4 times a day   1.Medication Requested:methocarbamol (ROBAXIN) 500 MG tablet  2. Pharmacy (Name, Street, City):[facility] [phone] - [place], [place] - [address] AT [facility] [address]  3. On Med List: Yes   4. Last Visit with PCP: 7.29.21   5. Next visit date with PCP: 8.27.21    Agent: Please be advised that RX refills may take up to 3 business days. We ask that you follow-up with your pharmacy.

## 2020-07-13 NOTE — Telephone Encounter (Signed)
New message   Returning call back to the CMA from today. 

## 2020-07-14 ENCOUNTER — Encounter: Payer: Self-pay | Admitting: Internal Medicine

## 2020-07-14 ENCOUNTER — Other Ambulatory Visit: Payer: Self-pay

## 2020-07-14 ENCOUNTER — Ambulatory Visit (INDEPENDENT_AMBULATORY_CARE_PROVIDER_SITE_OTHER): Payer: Medicare HMO | Admitting: Internal Medicine

## 2020-07-14 DIAGNOSIS — M5442 Lumbago with sciatica, left side: Secondary | ICD-10-CM

## 2020-07-14 MED ORDER — GABAPENTIN 100 MG PO CAPS
ORAL_CAPSULE | ORAL | 5 refills | Status: DC
Start: 2020-07-14 — End: 2020-07-22

## 2020-07-14 NOTE — Patient Instructions (Addendum)
Increase gabapentin to 300 mg at night.  We can increase this to 400 mg if it does not make you too drowsy or dizzy.    Consider doing physical therapy.    Sciatica Rehab Ask your health care provider which exercises are safe for you. Do exercises exactly as told by your health care provider and adjust them as directed. It is normal to feel mild stretching, pulling, tightness, or discomfort as you do these exercises. Stop right away if you feel sudden pain or your pain gets worse. Do not begin these exercises until told by your health care provider. Stretching and range-of-motion exercises These exercises warm up your muscles and joints and improve the movement and flexibility of your hips and back. These exercises also help to relieve pain, numbness, and tingling. Sciatic nerve glide 1. Sit in a chair with your head facing down toward your chest. Place your hands behind your back. Let your shoulders slump forward. 2. Slowly straighten one of your legs while you tilt your head back as if you are looking toward the ceiling. Only straighten your leg as far as you can without making your symptoms worse. 3. Hold this position for __________ seconds. 4. Slowly return to the starting position. 5. Repeat with your other leg. Repeat __________ times. Complete this exercise __________ times a day. Knee to chest with hip adduction and internal rotation  1. Lie on your back on a firm surface with both legs straight. 2. Bend one of your knees and move it up toward your chest until you feel a gentle stretch in your lower back and buttock. Then, move your knee toward the shoulder that is on the opposite side from your leg. This is hip adduction and internal rotation. ? Hold your leg in this position by holding on to the front of your knee. 3. Hold this position for __________ seconds. 4. Slowly return to the starting position. 5. Repeat with your other leg. Repeat __________ times. Complete this exercise  __________ times a day. Prone extension on elbows  1. Lie on your abdomen on a firm surface. A bed may be too soft for this exercise. 2. Prop yourself up on your elbows. 3. Use your arms to help lift your chest up until you feel a gentle stretch in your abdomen and your lower back. ? This will place some of your body weight on your elbows. If this is uncomfortable, try stacking pillows under your chest. ? Your hips should stay down, against the surface that you are lying on. Keep your hip and back muscles relaxed. 4. Hold this position for __________ seconds. 5. Slowly relax your upper body and return to the starting position. Repeat __________ times. Complete this exercise __________ times a day. Strengthening exercises These exercises build strength and endurance in your back. Endurance is the ability to use your muscles for a long time, even after they get tired. Pelvic tilt This exercise strengthens the muscles that lie deep in the abdomen. 1. Lie on your back on a firm surface. Bend your knees and keep your feet flat on the floor. 2. Tense your abdominal muscles. Tip your pelvis up toward the ceiling and flatten your lower back into the floor. ? To help with this exercise, you may place a small towel under your lower back and try to push your back into the towel. 3. Hold this position for __________ seconds. 4. Let your muscles relax completely before you repeat this exercise. Repeat __________ times. Complete  this exercise __________ times a day. Alternating arm and leg raises  1. Get on your hands and knees on a firm surface. If you are on a hard floor, you may want to use padding, such as an exercise mat, to cushion your knees. 2. Line up your arms and legs. Your hands should be directly below your shoulders, and your knees should be directly below your hips. 3. Lift your left leg behind you. At the same time, raise your right arm and straighten it in front of you. ? Do not lift  your leg higher than your hip. ? Do not lift your arm higher than your shoulder. ? Keep your abdominal and back muscles tight. ? Keep your hips facing the ground. ? Do not arch your back. ? Keep your balance carefully, and do not hold your breath. 4. Hold this position for __________ seconds. 5. Slowly return to the starting position. 6. Repeat with your right leg and your left arm. Repeat __________ times. Complete this exercise __________ times a day. Posture and body mechanics Good posture and healthy body mechanics can help to relieve stress in your body's tissues and joints. Body mechanics refers to the movements and positions of your body while you do your daily activities. Posture is part of body mechanics. Good posture means:  Your spine is in its natural S-curve position (neutral).  Your shoulders are pulled back slightly.  Your head is not tipped forward. Follow these guidelines to improve your posture and body mechanics in your everyday activities. Standing   When standing, keep your spine neutral and your feet about hip width apart. Keep a slight bend in your knees. Your ears, shoulders, and hips should line up.  When you do a task in which you stand in one place for a long time, place one foot up on a stable object that is 2-4 inches (5-10 cm) high, such as a footstool. This helps keep your spine neutral. Sitting   When sitting, keep your spine neutral and keep your feet flat on the floor. Use a footrest, if necessary, and keep your thighs parallel to the floor. Avoid rounding your shoulders, and avoid tilting your head forward.  When working at a desk or a computer, keep your desk at a height where your hands are slightly lower than your elbows. Slide your chair under your desk so you are close enough to maintain good posture.  When working at a computer, place your monitor at a height where you are looking straight ahead and you do not have to tilt your head forward or  downward to look at the screen. Resting  When lying down and resting, avoid positions that are most painful for you.  If you have pain with activities such as sitting, bending, stooping, or squatting, lie in a position in which your body does not bend very much. For example, avoid curling up on your side with your arms and knees near your chest (fetal position).  If you have pain with activities such as standing for a long time or reaching with your arms, lie with your spine in a neutral position and bend your knees slightly. Try the following positions: ? Lying on your side with a pillow between your knees. ? Lying on your back with a pillow under your knees. Lifting   When lifting objects, keep your feet at least shoulder width apart and tighten your abdominal muscles.  Bend your knees and hips and keep your spine neutral. It  is important to lift using the strength of your legs, not your back. Do not lock your knees straight out.  Always ask for help to lift heavy or awkward objects. This information is not intended to replace advice given to you by your health care provider. Make sure you discuss any questions you have with your health care provider. Document Revised: 03/22/2019 Document Reviewed: 12/20/2018 Elsevier Patient Education  2020 [employer].

## 2020-07-14 NOTE — Assessment & Plan Note (Signed)
Ongoing for 2 weeks Still with significant pain Complete prednisone prescription Continue meloxicam daily Continue methocarbamol 4 times daily Continue gabapentin 100 mg in the morning and increase nighttime dose to 300 mg-discussed that we can increase further if she does not feel too drowsy and dizzy on the medication, especially in the morning Stressed that she needs to start physical therapy-she deferred and will try home exercises Discussed that if this does not help she needs to do official physical therapy.  She will let me know Discussed that we may need to get an MRI and she would like to avoid this because she does not tolerate these well  Call if no improvement

## 2020-07-20 ENCOUNTER — Telehealth: Payer: Self-pay

## 2020-07-20 DIAGNOSIS — M5416 Radiculopathy, lumbar region: Secondary | ICD-10-CM

## 2020-07-20 NOTE — Telephone Encounter (Signed)
New message   Need a referral for physical therapy - was discussed at last office visit patient declined at that time   Aware that MD is on vacation.

## 2020-07-21 NOTE — Telephone Encounter (Signed)
ordered

## 2020-07-22 ENCOUNTER — Ambulatory Visit (INDEPENDENT_AMBULATORY_CARE_PROVIDER_SITE_OTHER): Payer: Medicare HMO | Admitting: Internal Medicine

## 2020-07-22 ENCOUNTER — Encounter: Payer: Self-pay | Admitting: Internal Medicine

## 2020-07-22 ENCOUNTER — Other Ambulatory Visit: Payer: Self-pay

## 2020-07-22 VITALS — BP 124/82 | HR 54 | Temp 98.3°F | Ht 66.0 in | Wt 164.0 lb

## 2020-07-22 DIAGNOSIS — M5442 Lumbago with sciatica, left side: Secondary | ICD-10-CM | POA: Diagnosis not present

## 2020-07-22 MED ORDER — KETOROLAC TROMETHAMINE 30 MG/ML IJ SOLN
30.0000 mg | Freq: Once | INTRAMUSCULAR | Status: AC
  Administered 2020-07-22: 30 mg via INTRAMUSCULAR

## 2020-07-22 MED ORDER — GABAPENTIN 300 MG PO CAPS: 300.0000 mg | ORAL_CAPSULE | Freq: Three times a day (TID) | ORAL | 0 refills | Status: DC

## 2020-07-22 NOTE — Progress Notes (Signed)
   Subjective:   Patient ID: Gloria [name], female    DOB: 1950-12-19, 69 y.o.   MRN: [id number]  HPI The patient is a 69 YO female coming in for low back pain with radiation to the left leg. Seen several times by ER and PCP for this same problem. Given depo-medrol and toradol by PCP and did not have significant improvement end of July 2021, then at ER given some pain medication and prednisone pack which also did not help. Then started on gabapentin and methocarbamol by PCP about 1 week ago which has not helped much. She is currently taking meloxicam for other pain which is not helping either. Using ice which helps some. Agreed to do PT which will start tomorrow. Denies change in symptoms recently. Is miserable with the pain which is 10/10. She is taking gabapentin 100 mg am and 300 mg pm and the pm dosing helps slightly after about an hour.   Review of Systems  Constitutional: Positive for activity change. Negative for appetite change, chills, fatigue, fever and unexpected weight change.  Respiratory: Negative.   Cardiovascular: Negative.   Gastrointestinal: Negative.   Musculoskeletal: Positive for arthralgias, back pain and myalgias. Negative for gait problem and joint swelling.  Skin: Negative.   Neurological: Negative.     Objective:  Physical Exam Constitutional:      Appearance: She is well-developed.  HENT:     Head: Normocephalic and atraumatic.  Cardiovascular:     Rate and Rhythm: Normal rate and regular rhythm.  Pulmonary:     Effort: Pulmonary effort is normal. No respiratory distress.     Breath sounds: Normal breath sounds. No wheezing or rales.  Musculoskeletal:     Cervical back: Normal range of motion.  Skin:    General: Skin is warm and dry.  Neurological:     Mental Status: She is alert and oriented to person, place, and time.     Coordination: Coordination normal.     Vitals:   07/22/20 0944  BP: 124/82  Pulse: (!) 54  Temp: 98.3 F (36.8 C)   TempSrc: Oral  SpO2: 96%  Weight: 164 lb (74.4 kg)  Height: 5\' 6"  (1.676 m)    This visit occurred during the SARS-CoV-2 public health emergency.  Safety protocols were in place, including screening questions prior to the visit, additional usage of staff PPE, and extensive cleaning of exam room while observing appropriate contact time as indicated for disinfecting solutions.   Assessment & Plan:  Toradol 30 mg IM given at visit

## 2020-07-22 NOTE — Patient Instructions (Addendum)
We have given you the pain medicine today in a shot called toradol.  We have sent in a new prescription for the gabapentin to take which is 300 mg capsules. You can take this up to 3 times a day.

## 2020-07-22 NOTE — Assessment & Plan Note (Signed)
Given toradol 30 mg IM today. Increase gabapentin to 300 mg TID since she has not had sedation and 300 mg dosing seems to help some. She is hesitant to try opioids given poor reaction with vicodin in the past which is appropriate. She will be starting PT tomorrow and encouraged that this could help a lot.

## 2020-07-23 ENCOUNTER — Other Ambulatory Visit: Payer: Self-pay

## 2020-07-23 ENCOUNTER — Encounter: Payer: Self-pay | Admitting: Physical Therapy

## 2020-07-23 ENCOUNTER — Ambulatory Visit: Payer: Medicare HMO | Attending: Internal Medicine | Admitting: Physical Therapy

## 2020-07-23 DIAGNOSIS — M6281 Muscle weakness (generalized): Secondary | ICD-10-CM

## 2020-07-23 DIAGNOSIS — M5416 Radiculopathy, lumbar region: Secondary | ICD-10-CM

## 2020-07-23 DIAGNOSIS — R262 Difficulty in walking, not elsewhere classified: Secondary | ICD-10-CM

## 2020-07-23 NOTE — Therapy (Signed)
[facility]patient Rehabilitation [facility] [address] [place], [place], [address] Phone: [phone]   Fax:  [phone]  Physical Therapy Evaluation  Patient Details  Name: Gloria [name] MRN: [id number] Date of Birth: May 14, 1951 Referring Provider (PT): Cheryll Cockayne, MD   Encounter Date: 07/23/2020   PT End of Session - 07/23/20 2140    Visit Number 1    Number of Visits 12    Date for PT Re-Evaluation 09/03/20    Authorization Type [employer] MCR    PT Start Time 1715    PT Stop Time 1815    PT Time Calculation (min) 60 min    Activity Tolerance Patient limited by pain    Behavior During Therapy [facility] for tasks assessed/performed           Past Medical History:  Diagnosis Date  . Alopecia areata 10/2010 onset  . Diabetes mellitus, type 2 (HCC)   . Dyslipidemia   . Hypertension   . Hypothyroid   . Myasthenia gravis     Past Surgical History:  Procedure Laterality Date  . ABDOMINAL HYSTERECTOMY  1990    There were no vitals filed for this visit.    Subjective Assessment - 07/23/20 2117    Subjective Pt. is a 69 y/o female referred to PT for lumbar and LLE radicular symptoms. She has previous history left LBP/sciatica which had been improved but reports new onset about 3-4 weeks ago of significant pain. No mechanism of injury noted. She was seen at ED for this 07/11/20. Tx. efforts to date include prednisone taper as well as toradol injection (had toradol injection yesterday) as well as gabapentin medication. Currently she reports left LBP 8/10 with radiating pain distally to left lower shin region. Pain is constant but worse with standing and walking with sitting and has been associated with significant sleep disturbance with pt. recently having to sleep in recliner. Pt. denies bowel or bladder changes.    Pertinent History previous history lumbar radiculopathy, diabetic, myasthenia gravis    Limitations Sitting;House hold  activities;Lifting;Standing;Walking    Diagnostic tests previous X-rays    Patient Stated Goals Get rid of back/leg pain    Currently in Pain? Yes    Pain Score 8     Pain Location Back    Pain Orientation Left;Lower    Pain Descriptors / Indicators Sharp    Pain Type Acute pain   acute on chronic   Pain Radiating Towards left leg idistal to lower anterior shin region    Pain Onset 1 to 4 weeks ago    Pain Frequency Constant    Aggravating Factors  standing and walking, "turning a certain way"    Pain Relieving Factors ice    Effect of Pain on Daily Activities Limits positional and activity tolerance              [facility] PT Assessment - 07/23/20 0001      Assessment   Medical Diagnosis Lumbar radiculopathy    Referring Provider (PT) Cheryll Cockayne, MD    Onset Date/Surgical Date 06/25/20   estimated per report 3-4 week symptom history   Prior Therapy none      Precautions   Precautions None      Restrictions   Weight Bearing Restrictions No      Balance Screen   Has the patient fallen in the past 6 months No      Home Environment   Living Environment Private residence    Living Arrangements Alone  Type of Home Other(Comment)   Townhome   Home Access Level entry    Home Layout Two level    Alternate Level Stairs-Number of Steps --   1 flight   Alternate Level Stairs-Rails Right      Prior Function   Level of Independence Independent with basic ADLs;Independent with community mobility without device      Cognition   Overall Cognitive Status Within Functional Limits for tasks assessed      Observation/Other Assessments   Focus on Therapeutic Outcomes (FOTO)  70% limited      Sensation   Light Touch Appears Intact   L2-S2 dermatomes intact bilat.     Posture/Postural Control   Posture Comments Difficulty sustained positioning during eval due to pain, tendency forward flexed sitting posture and trunk position with gait      Deep Tendon Reflexes   DTR Assessment  Site Patella;Achilles    Patella DTR --   right 2+, left 1+   Achilles DTR 2+      ROM / Strength   AROM / PROM / Strength AROM;PROM;Strength      AROM   Overall AROM Comments Pt. reports increased LBP with flexion>extension, she did report increased left leg pain with attempted extension but difficulty assessing if residual from flexion vs. onset with extension and unable to tolerate repeated motions due to pain    AROM Assessment Site Lumbar    Lumbar Flexion 50    Lumbar Extension 0    Lumbar - Right Side Bend 10    Lumbar - Left Side Bend 20    Lumbar - Right Rotation 30%    Lumbar - Left Rotation 50%      PROM   Overall PROM Comments left hip PROM grossly restricted due to pain      Strength   Strength Assessment Site Hip;Knee;Ankle    Right/Left Hip Right;Left    Right Hip Flexion 4/5    Right Hip External Rotation  4+/5    Right Hip Internal Rotation 4+/5    Left Hip Flexion 4-/5    Left Hip External Rotation 4/5    Left Hip Internal Rotation 4/5    Right/Left Knee Right;Left    Right Knee Flexion 5/5    Right Knee Extension 5/5    Left Knee Flexion 4+/5    Left Knee Extension 4/5    Right/Left Ankle Right;Left    Right Ankle Dorsiflexion 4+/5    Right Ankle Inversion 4+/5    Right Ankle Eversion 4/5    Left Ankle Dorsiflexion 4-/5    Left Ankle Inversion 4-/5    Left Ankle Eversion 4-/5      Flexibility   Soft Tissue Assessment /Muscle Length --   right SLR 50 deg, left SLR limited at 30 deg due to pain     Special Tests   Other special tests SLR (+) on left      Ambulation/Gait   Gait Comments Pt. ambulates with forward flexed gait with significant antalagia, she arrived without AD but required RW use after evaluation for gait to exit clinic due to high pain level and difficulty walking                      Objective measurements completed on examination: See above findings.       [facility] - 07/23/20 0001       Exercises   Exercises Lumbar      Lumbar Exercises:  Stretches   Lower Trunk Rotation Limitations attempted but held due to high pain level/poor tolerance      Lumbar Exercises: Supine   Pelvic Tilt 10 reps      Lumbar Exercises: Prone   Other Prone Lumbar Exercises attempted prone assessment for directional preference-tried prone over 2 pillows but unable to tolerate due to local back pain/no symptom peripheralization in prone noted      Modalities   Modalities Cryotherapy      Cryotherapy   Number Minutes Cryotherapy 10 Minutes    Cryotherapy Location Lumbar Spine    Type of Cryotherapy Ice pack      Manual Therapy   Manual Therapy Joint mobilization    Joint Mobilization left hip LAD grade I-IV oscillations                  PT Education - 07/23/20 2139    Education Details spine anatomy, HEP, potential symptom etiology, centralization vs. peripheralization with ROM exercises    Person(s) Educated Patient    Methods Explanation;Demonstration;Verbal cues;Tactile cues;Handout    Comprehension Returned demonstration;Verbalized understanding            PT Short Term Goals - 07/23/20 2157      PT SHORT TERM GOAL #1   Title Independent with initial HEP    Baseline needs HEP    Time 3    Period Weeks    Status New    Target Date 08/13/20      PT SHORT TERM GOAL #2   Title Return to sleeping in bed/no further need to sleep in recliner    Baseline sleeping in recliner/unable to sleep in bed due to pain    Time 3    Period Weeks    Status New    Target Date 08/13/20             PT Long Term Goals - 07/23/20 2151      PT LONG TERM GOAL #1   Title Independent with advanced HEP for continued progress after d/c from therapy    Time 6    Period Weeks    Status New    Target Date 09/03/20      PT LONG TERM GOAL #2   Title Improve FOTO outcome measure score to 44% or less impairment    Baseline 70% limited    Time 6    Period Weeks    Status New     Target Date 09/03/20      PT LONG TERM GOAL #3   Title Increase trunk flexion AROM at least 20 deg to improve ability for bending for chores and activities such as donning shoes    Baseline 50 deg    Time 6    Period Weeks    Status New    Target Date 09/03/20      PT LONG TERM GOAL #4   Title Tolerate standing/ambulation for periods at least 20-30 min for activities such as grocery shopping with LBP/LLE pain 3/10 or less    Baseline 8/10    Time 6    Period Weeks    Status New    Target Date 09/03/20      PT LONG TERM GOAL #5   Title Increase left LE strength at least grossly 1/2 MMT grade to improve ability for stair navigation and lifting for chores    Baseline see objective    Time 6    Period Weeks    Status New  Target Date 09/03/20                  Plan - 07/23/20 2141    Clinical Impression Statement Pt. presents with acute on chronic LBP with left LE radicular pain. Limited tolerance mobility/ROM assessment during evaluation with high pain level and difficulty establishing directional preference. Radicular symptoms worse with standing and walking as well as pt. noting mild relief with seated flexion could be consistent with underling stenosis, however pt. had (+) SLR test with limited tolerance supine flexion/hooklying position and high pain level also with sitting and lying down would be atypical (for stenosis associated radicular symptoms/neurogenic claudication). Pt. did get temporary symptom ease with left hip distraction mobilization during eval and was instructed in trial gentle stretches with flexion bias ROM and will continue to assess for response with symptoms and potential centralization of radicular symptoms.    Personal Factors and Comorbidities Comorbidity 3+    Comorbidities diabetic, myasthenia gravis, HTN    Examination-Activity Limitations Stand;Lift;Squat;Sleep;Hygiene/Grooming;Dressing;Bathing;Sit;Transfers;Carry;Locomotion Level;Stairs;Bed  Mobility    Examination-Participation Restrictions Cleaning;Laundry;Shop;Meal Prep;Driving;Community Activity    Stability/Clinical Decision Making Evolving/Moderate complexity    Clinical Decision Making Moderate    Rehab Potential Fair    PT Frequency --   2-3x/week   PT Duration 6 weeks    PT Treatment/Interventions ADLs/Self Care Home Management;Cryotherapy;Traction;Therapeutic exercise;Ultrasound;Electrical Stimulation;Moist Heat;Therapeutic activities;Functional mobility training;Neuromuscular re-education;Patient/family education;Manual techniques;Dry needling;Taping    PT Next Visit Plan Check response flexion bias ROM (SKTC) for left LE radicular symptoms, left hip long axis distraction, pending response to directional preference exercises progress flexion as tolerated vs. trial extension, ROM and gentle manual as tolerated, modalities prn for pain-prefers cryo vs. heat    PT Home Exercise Plan Access code: [email or web address]    Consulted and Agree with Plan of Care Patient           Patient will benefit from skilled therapeutic intervention in order to improve the following deficits and impairments:  Pain, Postural dysfunction, Impaired flexibility, Decreased strength, Decreased activity tolerance, Decreased range of motion, Difficulty walking, Hypomobility, Increased muscle spasms  Visit Diagnosis: Radiculopathy, lumbar region  Muscle weakness (generalized)  Difficulty in walking, not elsewhere classified     Problem List Patient Active Problem List   Diagnosis Date Noted  . Acute cervical radiculopathy 07/09/2020  . Pruritus 02/06/2020  . Raynaud's phenomenon 01/06/2020  . Leg cramping 07/25/2018  . Left wrist tendinitis 04/30/2018  . Left-sided low back pain with sciatica 04/30/2018  . Hand numbness 01/24/2018  . Lumbar radiculopathy 12/23/2016  . Plantar fasciitis of right foot 06/22/2016  . Leg weakness, bilateral 06/22/2016  . Cough 12/19/2014  . Carpal tunnel syndrome  of right wrist 03/07/2013  . Myasthenia gravis without exacerbation (HCC) 12/31/2010  . ALOPECIA 12/31/2010  . Hypothyroidism 12/29/2010  . Type 2 diabetes, diet controlled (HCC) 12/29/2010  . Hyperlipidemia 12/29/2010  . Essential hypertension 12/29/2010  . Coronary atherosclerosis 12/29/2010  . OSTEOARTHRITIS 12/29/2010    Lazarus Gowda, PT, DPT 07/23/20 10:00 PM  [facility] [address] [place], [place], [address] Phone: [phone]   Fax:  [phone]  Name: Demaya Hardge MRN: [id number] Date of Birth: 03-31-1951

## 2020-07-24 ENCOUNTER — Other Ambulatory Visit: Payer: Self-pay | Admitting: Internal Medicine

## 2020-07-29 ENCOUNTER — Telehealth: Payer: Self-pay | Admitting: Internal Medicine

## 2020-07-29 DIAGNOSIS — M5416 Radiculopathy, lumbar region: Secondary | ICD-10-CM

## 2020-07-29 NOTE — Telephone Encounter (Signed)
I think she should continue physical therapy.  The may be good for her to see an orthopedic-I can refer her if she agrees.

## 2020-07-29 NOTE — Telephone Encounter (Signed)
New message:   Pt states she is still hurting from the pinched nerve and would like to discuss due to the length of time she has had this pain. She states it is hurting most in the left lower back and left hip and leg. Please advise.

## 2020-07-30 ENCOUNTER — Ambulatory Visit: Payer: Medicare HMO | Admitting: Physical Therapy

## 2020-08-05 ENCOUNTER — Other Ambulatory Visit: Payer: Self-pay

## 2020-08-05 ENCOUNTER — Other Ambulatory Visit: Payer: Self-pay | Admitting: Internal Medicine

## 2020-08-05 ENCOUNTER — Ambulatory Visit: Payer: Medicare HMO | Admitting: Physical Therapy

## 2020-08-05 ENCOUNTER — Encounter: Payer: Self-pay | Admitting: Physical Therapy

## 2020-08-05 DIAGNOSIS — M6281 Muscle weakness (generalized): Secondary | ICD-10-CM

## 2020-08-05 DIAGNOSIS — M5416 Radiculopathy, lumbar region: Secondary | ICD-10-CM | POA: Diagnosis not present

## 2020-08-05 DIAGNOSIS — R262 Difficulty in walking, not elsewhere classified: Secondary | ICD-10-CM | POA: Diagnosis not present

## 2020-08-05 NOTE — Therapy (Signed)
[facility] Outpatient Rehabilitation [facility] [address] [place], [place], [address] Phone: [phone]   Fax:  [phone]  Physical Therapy Treatment  Patient Details  Name: Gloria [name] MRN: [id number] Date of Birth: [birth year] Referring Provider (PT): Cheryll Cockayne, MD   Encounter Date: 08/05/2020   PT End of Session - 08/05/20 1331    Visit Number 2    Number of Visits 12    Date for PT Re-Evaluation 09/03/20    Authorization Type [employer] MCR    Authorization Time Period 07/23/20-08/28/20    Authorization - Visit Number 2    Authorization - Number of Visits 12    PT Start Time 1328    PT Stop Time 1420    PT Time Calculation (min) 52 min    Activity Tolerance Patient limited by pain    Behavior During Therapy [facility] for tasks assessed/performed           Past Medical History:  Diagnosis Date   Alopecia areata 10/2010 onset   Diabetes mellitus, type 2 (HCC)    Dyslipidemia    Hypertension    Hypothyroid    Myasthenia gravis     Past Surgical History:  Procedure Laterality Date   ABDOMINAL HYSTERECTOMY  1990    There were no vitals filed for this visit.   Subjective Assessment - 08/05/20 1329    Subjective Pt. returns for first follow up tx. session after eval. She continues with left lumbar and left leg radicular symptoms but pain is not quite as intense as before. She rates her pain at 6/10 pre-tx. but reports pain increases intermittently to 8-10/10. Pain is worse with both sitting and standing/walking but walking is the worst.    Pertinent History previous history lumbar radiculopathy, diabetic, myasthenia gravis    Limitations Sitting;House hold activities;Lifting;Standing;Walking    Currently in Pain? Yes    Pain Score 6     Pain Location Back    Pain Orientation Left;Lower    Pain Descriptors / Indicators Sharp    Pain Type Acute pain    Pain Radiating Towards left leg distally to shin region    Pain Onset More than a  month ago    Pain Frequency Constant    Aggravating Factors  walking, sleep disturbance    Pain Relieving Factors ice    Effect of Pain on Daily Activities limits positional and activity tolerance                             [facility] Adult PT Treatment/Exercise - 08/05/20 0001      Lumbar Exercises: Stretches   Single Knee to Chest Stretch Limitations x 10 reps brief holds <5 sec with therapist asisstance    Lower Trunk Rotation Limitations right LTR at 90 deg hip flexion with therapist assist x 10 reps    Pelvic Tilt 10 reps    Pelvic Tilt Limitations tactile cues for form, initially holds 3-5 seconds but switched to brief/limited holds due to pain/liimited tolerance    Piriformis Stretch Left;3 reps;30 seconds      Lumbar Exercises: Sidelying   Other Sidelying Lumbar Exercises right sidelying left trunk rotation x 10 reps      Modalities   Modalities Electrical Stimulation      Cryotherapy   Number Minutes Cryotherapy 15 Minutes    Cryotherapy Location Lumbar Spine   also left knee and lower leg per pt. request   Type of Cryotherapy  Ice pack      Programme researcher, broadcasting/film/video Location left lumbar region    also left knee and lower leg per pt. request   Electrical Stimulation Action premod 80-150 HZ    Electrical Stimulation Parameters to tolerance x 15 minutes with cryo    Electrical Stimulation Goals Pain      Manual Therapy   Joint Mobilization left hip LAD grade I-IV oscillations                    PT Short Term Goals - 07/23/20 2157      PT SHORT TERM GOAL #1   Title Independent with initial HEP    Baseline needs HEP    Time 3    Period Weeks    Status New    Target Date 08/13/20      PT SHORT TERM GOAL #2   Title Return to sleeping in bed/no further need to sleep in recliner    Baseline sleeping in recliner/unable to sleep in bed due to pain    Time 3    Period Weeks    Status New    Target Date 08/13/20               PT Long Term Goals - 07/23/20 2151      PT LONG TERM GOAL #1   Title Independent with advanced HEP for continued progress after d/c from therapy    Time 6    Period Weeks    Status New    Target Date 09/03/20      PT LONG TERM GOAL #2   Title Improve FOTO outcome measure score to 44% or less impairment    Baseline 70% limited    Time 6    Period Weeks    Status New    Target Date 09/03/20      PT LONG TERM GOAL #3   Title Increase trunk flexion AROM at least 20 deg to improve ability for bending for chores and activities such as donning shoes    Baseline 50 deg    Time 6    Period Weeks    Status New    Target Date 09/03/20      PT LONG TERM GOAL #4   Title Tolerate standing/ambulation for periods at least 20-30 min for activities such as grocery shopping with LBP/LLE pain 3/10 or less    Baseline 8/10    Time 6    Period Weeks    Status New    Target Date 09/03/20      PT LONG TERM GOAL #5   Title Increase left LE strength at least grossly 1/2 MMT grade to improve ability for stair navigation and lifting for chores    Baseline see objective    Time 6    Period Weeks    Status New    Target Date 09/03/20                 Plan - 08/05/20 1413    Clinical Impression Statement Tolerance for exercises still limited but improved mobility tolerance from baseline status at eval where pt. required RW to leave clinic. Still somewhat unclear on directional preference given limited ROM tolerance but tolerated continued trial gentle flexion exercises, also did well with right sidelying left trunk rotation so added this to HEP. Still a mixed presentation with radicular symptoms with standing/walking that could be consistent with undetlying stenosis but also (+) SLR and pain with  sitting which could be discogenic.    Personal Factors and Comorbidities Comorbidity 3+    Comorbidities diabetic, myasthenia gravis, HTN    Examination-Activity Limitations  Stand;Lift;Squat;Sleep;Hygiene/Grooming;Dressing;Bathing;Sit;Transfers;Carry;Locomotion Level;Stairs;Bed Mobility    Examination-Participation Restrictions Cleaning;Laundry;Shop;Meal Prep;Driving;Community Activity    Stability/Clinical Decision Making Evolving/Moderate complexity    Clinical Decision Making Moderate    PT Frequency --   2-3x/week   PT Duration 6 weeks    PT Treatment/Interventions ADLs/Self Care Home Management;Cryotherapy;Traction;Therapeutic exercise;Ultrasound;Electrical Stimulation;Moist Heat;Therapeutic activities;Functional mobility training;Neuromuscular re-education;Patient/family education;Manual techniques;Dry needling;Taping    PT Next Visit Plan Continue left hip LAD, continue trial gentle flexion bias ROM exercise progression but OK adjust as needed pending symptoms and response    PT Home Exercise Plan Access code: [email or web address]    Consulted and Agree with Plan of Care Patient           Patient will benefit from skilled therapeutic intervention in order to improve the following deficits and impairments:  Pain, Postural dysfunction, Impaired flexibility, Decreased strength, Decreased activity tolerance, Decreased range of motion, Difficulty walking, Hypomobility, Increased muscle spasms  Visit Diagnosis: Radiculopathy, lumbar region  Muscle weakness (generalized)  Difficulty in walking, not elsewhere classified     Problem List Patient Active Problem List   Diagnosis Date Noted   Acute cervical radiculopathy 07/09/2020   Pruritus 02/06/2020   Raynaud's phenomenon 01/06/2020   Leg cramping 07/25/2018   Left wrist tendinitis 04/30/2018   Left-sided low back pain with sciatica 04/30/2018   Hand numbness 01/24/2018   Lumbar radiculopathy 12/23/2016   Plantar fasciitis of right foot 06/22/2016   Leg weakness, bilateral 06/22/2016   Cough 12/19/2014   Carpal tunnel syndrome of right wrist 03/07/2013   Myasthenia gravis without exacerbation  (HCC) 12/31/2010   ALOPECIA 12/31/2010   Hypothyroidism 12/29/2010   Type 2 diabetes, diet controlled (HCC) 12/29/2010   Hyperlipidemia 12/29/2010   Essential hypertension 12/29/2010   Coronary atherosclerosis 12/29/2010   OSTEOARTHRITIS 12/29/2010    Lazarus Gowda, PT, DPT 08/05/20 2:17 PM  [facility] [address] [place], [place], [address] Phone: [phone]   Fax:  [phone]  Name: Gloria [name] MRN: [id number] Date of Birth: 04/03/1951

## 2020-08-07 ENCOUNTER — Ambulatory Visit: Payer: Medicare HMO | Admitting: Internal Medicine

## 2020-08-07 ENCOUNTER — Ambulatory Visit: Payer: Medicare HMO | Admitting: Physical Therapy

## 2020-08-11 ENCOUNTER — Encounter: Payer: Self-pay | Admitting: Physical Therapy

## 2020-08-11 ENCOUNTER — Other Ambulatory Visit: Payer: Self-pay

## 2020-08-11 ENCOUNTER — Ambulatory Visit: Payer: Medicare HMO | Admitting: Physical Therapy

## 2020-08-11 DIAGNOSIS — M6281 Muscle weakness (generalized): Secondary | ICD-10-CM | POA: Diagnosis not present

## 2020-08-11 DIAGNOSIS — M5416 Radiculopathy, lumbar region: Secondary | ICD-10-CM | POA: Diagnosis not present

## 2020-08-11 DIAGNOSIS — R262 Difficulty in walking, not elsewhere classified: Secondary | ICD-10-CM

## 2020-08-11 NOTE — Therapy (Signed)
[facility] [address] [place], [place], [address] Phone: [phone]   Fax:  [phone]  Physical Therapy Treatment  Patient Details  Name: Gloria [name] MRN: [id number] Date of Birth: November 08, 1951 Referring Provider (PT): Cheryll Cockayne, MD   Encounter Date: 08/11/2020   PT End of Session - 08/11/20 1535    Visit Number 3    Number of Visits 12    Date for PT Re-Evaluation 09/03/20    Authorization Type [employer] MCR    Authorization Time Period 07/23/20-08/28/20    Authorization - Visit Number 3    Authorization - Number of Visits 12    PT Start Time 1530    PT Stop Time 1628    PT Time Calculation (min) 58 min    Activity Tolerance Patient limited by pain    Behavior During Therapy [facility] for tasks assessed/performed           Past Medical History:  Diagnosis Date  . Alopecia areata 10/2010 onset  . Diabetes mellitus, type 2 (HCC)   . Dyslipidemia   . Hypertension   . Hypothyroid   . Myasthenia gravis     Past Surgical History:  Procedure Laterality Date  . ABDOMINAL HYSTERECTOMY  1990    There were no vitals filed for this visit.   Subjective Assessment - 08/11/20 1533    Subjective Patient reports she continues to wake up with left lower back and leg pain and she has to make herself get up to put ice on it. She is doing the exercises at home and they are going ok.    Patient Stated Goals Get rid of back/leg pain    Currently in Pain? Yes    Pain Score 7     Pain Location Back    Pain Orientation Lower;Left    Pain Descriptors / Indicators Sharp    Pain Type Acute pain    Pain Radiating Towards left lateral leg distally to shin/calf region    Pain Onset More than a month ago    Pain Frequency Constant                             [facility] Adult PT Treatment/Exercise - 08/11/20 0001      Exercises   Exercises Lumbar      Lumbar Exercises: Stretches   Lower Trunk Rotation 5 reps;10  seconds    Lower Trunk Rotation Limitations Partial range to left, full range to right    Piriformis Stretch Limitations Patient unable to tolerate piriformis stretch    Other Lumbar Stretch Exercise Seated lumbar flexion with physioball x5, reach to floor without physioball x5      Lumbar Exercises: Aerobic   Nustep L3 x 4 min to patient tolerance      Lumbar Exercises: Supine   Pelvic Tilt 10 reps    Pelvic Tilt Limitations unable to hold for any period of time, patient placed hands below lower back for cueing    Clam 5 reps;3 seconds   2 sets   Bent Knee Raise 5 reps   2 sets   Bent Knee Raise Limitations alternating marching, partial range on left      Modalities   Modalities Electrical Stimulation;Cryotherapy      Cryotherapy   Number Minutes Cryotherapy 15 Minutes    Cryotherapy Location Lumbar Spine    Type of Cryotherapy Ice pack      Electrical Stimulation  Electrical Stimulation Location Left lumbar region    Electrical Stimulation Action IFC 80-150 x 15 min    Electrical Stimulation Parameters Patient tolerance with cryo    Electrical Stimulation Goals Pain      Manual Therapy   Joint Mobilization Left hip LAD with gentle oscillations for multiple bouts                  PT Education - 08/11/20 1535    Education Details HEP    Person(s) Educated Patient    Methods Explanation;Demonstration;Tactile cues;Verbal cues    Comprehension Verbalized understanding;Returned demonstration;Verbal cues required;Tactile cues required;Need further instruction            PT Short Term Goals - 07/23/20 2157      PT SHORT TERM GOAL #1   Title Independent with initial HEP    Baseline needs HEP    Time 3    Period Weeks    Status New    Target Date 08/13/20      PT SHORT TERM GOAL #2   Title Return to sleeping in bed/no further need to sleep in recliner    Baseline sleeping in recliner/unable to sleep in bed due to pain    Time 3    Period Weeks    Status New     Target Date 08/13/20             PT Long Term Goals - 07/23/20 2151      PT LONG TERM GOAL #1   Title Independent with advanced HEP for continued progress after d/c from therapy    Time 6    Period Weeks    Status New    Target Date 09/03/20      PT LONG TERM GOAL #2   Title Improve FOTO outcome measure score to 44% or less impairment    Baseline 70% limited    Time 6    Period Weeks    Status New    Target Date 09/03/20      PT LONG TERM GOAL #3   Title Increase trunk flexion AROM at least 20 deg to improve ability for bending for chores and activities such as donning shoes    Baseline 50 deg    Time 6    Period Weeks    Status New    Target Date 09/03/20      PT LONG TERM GOAL #4   Title Tolerate standing/ambulation for periods at least 20-30 min for activities such as grocery shopping with LBP/LLE pain 3/10 or less    Baseline 8/10    Time 6    Period Weeks    Status New    Target Date 09/03/20      PT LONG TERM GOAL #5   Title Increase left LE strength at least grossly 1/2 MMT grade to improve ability for stair navigation and lifting for chores    Baseline see objective    Time 6    Period Weeks    Status New    Target Date 09/03/20                 Plan - 08/11/20 1536    Clinical Impression Statement Patient limited by pain with therapy today and she continues to exhibit poor tolerance for exercise. Patient does respond well to LAD but symptoms return with any activity. She seemed to tolerated flexion based stretching better this visit so provided her with seated repeated flexion for home use and instructed  in reponse for directional preference. Use of e-stim and cryo following therapy to reduce pain. Patient would benefit from continued skilled PT to reduce pain and progress mobility to return to prior level of function.    PT Treatment/Interventions ADLs/Self Care Home Management;Cryotherapy;Traction;Therapeutic exercise;Ultrasound;Electrical  Stimulation;Moist Heat;Therapeutic activities;Functional mobility training;Neuromuscular re-education;Patient/family education;Manual techniques;Dry needling;Taping    PT Next Visit Plan Continue left hip LAD, continue trial gentle flexion bias ROM exercise progression but OK adjust as needed pending symptoms and response    PT Home Exercise Plan Access code: [email or web address]    Consulted and Agree with Plan of Care Patient           Patient will benefit from skilled therapeutic intervention in order to improve the following deficits and impairments:  Pain, Postural dysfunction, Impaired flexibility, Decreased strength, Decreased activity tolerance, Decreased range of motion, Difficulty walking, Hypomobility, Increased muscle spasms  Visit Diagnosis: Radiculopathy, lumbar region  Muscle weakness (generalized)  Difficulty in walking, not elsewhere classified     Problem List Patient Active Problem List   Diagnosis Date Noted  . Acute cervical radiculopathy 07/09/2020  . Pruritus 02/06/2020  . Raynaud's phenomenon 01/06/2020  . Leg cramping 07/25/2018  . Left wrist tendinitis 04/30/2018  . Left-sided low back pain with sciatica 04/30/2018  . Hand numbness 01/24/2018  . Lumbar radiculopathy 12/23/2016  . Plantar fasciitis of right foot 06/22/2016  . Leg weakness, bilateral 06/22/2016  . Cough 12/19/2014  . Carpal tunnel syndrome of right wrist 03/07/2013  . Myasthenia gravis without exacerbation (HCC) 12/31/2010  . ALOPECIA 12/31/2010  . Hypothyroidism 12/29/2010  . Type 2 diabetes, diet controlled (HCC) 12/29/2010  . Hyperlipidemia 12/29/2010  . Essential hypertension 12/29/2010  . Coronary atherosclerosis 12/29/2010  . OSTEOARTHRITIS 12/29/2010    Rosana Hoes, PT, DPT, LAT, ATC 08/11/20  4:21 PM Phone: [phone] Fax: [phone]   [facility] [address] [place], [place], [address] Phone: [phone]   Fax:   [phone]  Name: Zaylynn Rickett MRN: [id number] Date of Birth: [birth year]

## 2020-08-12 ENCOUNTER — Other Ambulatory Visit: Payer: Self-pay

## 2020-08-12 ENCOUNTER — Ambulatory Visit (INDEPENDENT_AMBULATORY_CARE_PROVIDER_SITE_OTHER): Payer: Medicare HMO | Admitting: Orthopaedic Surgery

## 2020-08-12 ENCOUNTER — Ambulatory Visit (INDEPENDENT_AMBULATORY_CARE_PROVIDER_SITE_OTHER): Payer: Medicare HMO

## 2020-08-12 ENCOUNTER — Encounter: Payer: Self-pay | Admitting: Orthopaedic Surgery

## 2020-08-12 ENCOUNTER — Ambulatory Visit: Payer: Self-pay

## 2020-08-12 DIAGNOSIS — G8929 Other chronic pain: Secondary | ICD-10-CM

## 2020-08-12 DIAGNOSIS — M5442 Lumbago with sciatica, left side: Secondary | ICD-10-CM

## 2020-08-12 DIAGNOSIS — M4807 Spinal stenosis, lumbosacral region: Secondary | ICD-10-CM

## 2020-08-12 NOTE — Progress Notes (Signed)
Office Visit Note   Patient: Gloria [name]           Date of Birth: August 26, 1951           MRN: [id number] Visit Date: 08/12/2020              Requested by: Pincus Sanes, MD [address] [place],  [place] [address] PCP: Pincus Sanes, MD   Assessment & Plan: Visit Diagnoses:  1. Chronic left-sided low back pain with left-sided sciatica     Plan:   At this point given the severity of her radicular symptoms combined with the failure of all forms of conservative treatment including physical therapy, steroid taper, Neurontin, a MRI is warranted lumbar spine to assess the degree of nerve compression and help come up with a further treatment plan.  She agrees with this assessment and plan and we will work on ordering the MRI and see her in follow-up after the MRI.  Follow-Up Instructions: Return in about 2 weeks (around 08/26/2020).   Orders:  Orders Placed This Encounter  Procedures  . XR Lumbar Spine 2-3 Views   No orders of the defined types were placed in this encounter.     Procedures: No procedures performed   Clinical Data: No additional findings.   Subjective: Chief Complaint  Patient presents with  . Lower Back - Pain  . Left Leg - Pain  The patient comes in today for evaluation treatment of left-sided sciatica and low back pain with radicular symptoms going all the way down to her foot.  She is a new patient however, I have seen her before but is been years.  This is been going on for several months and getting significantly worse.  She used to be diabetic but is now under good blood glucose control without medications due to significant weight loss.  She is on Neurontin already 3-4 times a day.  She has tried a steroid taper with prednisone.  She is even going through physical therapy.  Her symptoms are getting worse for her so she was referred to [place] for further ration treatment of her left-sided sciatica with radicular symptoms and low back pain.  She  denies any change in bowel bladder function.  HPI  Review of Systems She currently denies any headache, chest pain, shortness of breath, fever, chills, nausea, vomiting  Objective: Vital Signs: There were no vitals taken for this visit.  Physical Exam She is alert and orient x3 and in no acute distress.  She is not needing an assistive device to walk. Ortho Exam On examination she has a positive straight leg raise to the left side but not the right side.  She has significant pain with flexion extension of her lumbar spine radiating into the left side.  There is no gross weakness in her feet but there is definitely decreased sensation in the L5 distribution on the left. Specialty Comments:  No specialty comments available.  Imaging: XR Lumbar Spine 2-3 Views  Result Date: 08/12/2020 2 views of the lumbar spine show significant disc space narrowing at L5/S1 that is worsened when compared to previous films from several years ago.  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  There is a slight anterolisthesis of L4 on L5.    PMFS History: Patient Active Problem List   Diagnosis Date Noted  . Acute cervical radiculopathy 07/09/2020  . Pruritus 02/06/2020  . Raynaud's phenomenon 01/06/2020  .  Leg cramping 07/25/2018  . Left wrist tendinitis 04/30/2018  . Left-sided low back pain with sciatica 04/30/2018  . Hand numbness 01/24/2018  . Lumbar radiculopathy 12/23/2016  . Plantar fasciitis of right foot 06/22/2016  . Leg weakness, bilateral 06/22/2016  . Cough 12/19/2014  . Carpal tunnel syndrome of right wrist 03/07/2013  . Myasthenia gravis without exacerbation (HCC) 12/31/2010  . ALOPECIA 12/31/2010  . Hypothyroidism 12/29/2010  . Type 2 diabetes, diet controlled (HCC) 12/29/2010  . Hyperlipidemia 12/29/2010  . Essential hypertension 12/29/2010  . Coronary atherosclerosis 12/29/2010  . OSTEOARTHRITIS 12/29/2010   Past Medical  History:  Diagnosis Date  . Alopecia areata 10/2010 onset  . Diabetes mellitus, type 2 (HCC)   . Dyslipidemia   . Hypertension   . Hypothyroid   . Myasthenia gravis     Family History  Adopted: Yes  Problem Relation Age of Onset  . Breast cancer Neg Hx     Past Surgical History:  Procedure Laterality Date  . ABDOMINAL HYSTERECTOMY  1990   Social History   Occupational History  . Occupation: retired  Tobacco Use  . Smoking status: Never Smoker  . Smokeless tobacco: Never Used  Vaping Use  . Vaping Use: Never used  Substance and Sexual Activity  . Alcohol use: No  . Drug use: Never  . Sexual activity: Never

## 2020-08-13 ENCOUNTER — Encounter: Payer: Self-pay | Admitting: Physical Therapy

## 2020-08-13 ENCOUNTER — Ambulatory Visit: Payer: Medicare HMO | Attending: Internal Medicine | Admitting: Physical Therapy

## 2020-08-13 ENCOUNTER — Other Ambulatory Visit: Payer: Self-pay

## 2020-08-13 DIAGNOSIS — M5416 Radiculopathy, lumbar region: Secondary | ICD-10-CM | POA: Insufficient documentation

## 2020-08-13 DIAGNOSIS — M6281 Muscle weakness (generalized): Secondary | ICD-10-CM | POA: Insufficient documentation

## 2020-08-13 DIAGNOSIS — R262 Difficulty in walking, not elsewhere classified: Secondary | ICD-10-CM | POA: Diagnosis not present

## 2020-08-13 NOTE — Therapy (Signed)
[facility] [address] [place], [place], [address] Phone: [phone]   Fax:  [phone]  Physical Therapy Treatment  Patient Details  Name: Gloria [name] MRN: [id number] Date of Birth: 10-03-1951 Referring Provider (PT): Cheryll Cockayne, MD   Encounter Date: 08/13/2020   PT End of Session - 08/13/20 1732    Visit Number 4    Number of Visits 12    Date for PT Re-Evaluation 09/03/20    Authorization Type [employer] MCR    Authorization Time Period 07/23/20-08/28/20    Authorization - Visit Number 4    Authorization - Number of Visits 12    PT Start Time 1700    PT Stop Time 1745    PT Time Calculation (min) 45 min    Activity Tolerance Patient limited by pain    Behavior During Therapy [facility] for tasks assessed/performed           Past Medical History:  Diagnosis Date  . Alopecia areata 10/2010 onset  . Diabetes mellitus, type 2 (HCC)   . Dyslipidemia   . Hypertension   . Hypothyroid   . Myasthenia gravis     Past Surgical History:  Procedure Laterality Date  . ABDOMINAL HYSTERECTOMY  1990    There were no vitals filed for this visit.   Subjective Assessment - 08/13/20 1730    Subjective Patient reports slight improvement in symptoms and she was able to sleep a little better last night. She saw the doctor yesterday and she is scheduled to get an MRI. Patient stated the doctor suggested injections or surgery but she does not want to pursue either of those options.    Patient Stated Goals Get rid of back/leg pain    Currently in Pain? Yes    Pain Score 7     Pain Location Back    Pain Orientation Left;Lower    Pain Descriptors / Indicators Sharp    Pain Type Acute pain    Pain Radiating Towards left lateral leg distally to shin/calf region    Pain Onset More than a month ago    Pain Frequency Constant                             [facility] Adult PT Treatment/Exercise - 08/13/20 0001       Exercises   Exercises Lumbar      Lumbar Exercises: Stretches   Single Knee to Chest Stretch 3 reps    Single Knee to Chest Stretch Limitations PROM, inconsistent with hold times due to pain level    Lower Trunk Rotation 5 reps;10 seconds    Lower Trunk Rotation Limitations Partial range to left, full range to right    Other Lumbar Stretch Exercise Seated lumbar flexion reach to floor x10      Lumbar Exercises: Supine   Pelvic Tilt 10 reps    Pelvic Tilt Limitations no hold, partial range with posterior and anterior    Bent Knee Raise 10 reps    Bent Knee Raise Limitations alternating marching      Modalities   Modalities Cryotherapy      Cryotherapy   Number Minutes Cryotherapy 10 Minutes    Cryotherapy Location Lumbar Spine   left lower back, hip, lateral leg down to calf   Type of Cryotherapy Ice pack                  PT Education - 08/13/20  1732    Education Details HEP    Person(s) Educated Patient    Methods Explanation    Comprehension Verbalized understanding;Need further instruction            PT Short Term Goals - 08/13/20 1735      PT SHORT TERM GOAL #1   Title Independent with initial HEP    Baseline Patient inconsistent with hep due to pain level    Time 3    Period Weeks    Status On-going    Target Date 08/13/20      PT SHORT TERM GOAL #2   Title Return to sleeping in bed/no further need to sleep in recliner    Baseline Patient continues to sleep in recliner, states better sleep last night    Time 3    Period Weeks    Status On-going    Target Date 08/13/20             PT Long Term Goals - 07/23/20 2151      PT LONG TERM GOAL #1   Title Independent with advanced HEP for continued progress after d/c from therapy    Time 6    Period Weeks    Status New    Target Date 09/03/20      PT LONG TERM GOAL #2   Title Improve FOTO outcome measure score to 44% or less impairment    Baseline 70% limited    Time 6    Period Weeks     Status New    Target Date 09/03/20      PT LONG TERM GOAL #3   Title Increase trunk flexion AROM at least 20 deg to improve ability for bending for chores and activities such as donning shoes    Baseline 50 deg    Time 6    Period Weeks    Status New    Target Date 09/03/20      PT LONG TERM GOAL #4   Title Tolerate standing/ambulation for periods at least 20-30 min for activities such as grocery shopping with LBP/LLE pain 3/10 or less    Baseline 8/10    Time 6    Period Weeks    Status New    Target Date 09/03/20      PT LONG TERM GOAL #5   Title Increase left LE strength at least grossly 1/2 MMT grade to improve ability for stair navigation and lifting for chores    Baseline see objective    Time 6    Period Weeks    Status New    Target Date 09/03/20                 Plan - 08/13/20 1732    Clinical Impression Statement Patient continues to be limited by left lower back and radicular pain in left leg and is unable to tolerate much therapy. Only LAD for left LE helps to alleviate symptoms, and it is still unclear etiology of patients radicular symptoms but she seesm to do better with flexion based exercises indicating possible stenosis vs discal. Continued using cryo following therapy to manage pain and patient reports feeling better following cryo. Patient would benefit from continued skilled PT to reduce pain and progress mobility to return to prior level of function.    PT Treatment/Interventions ADLs/Self Care Home Management;Cryotherapy;Traction;Therapeutic exercise;Ultrasound;Electrical Stimulation;Moist Heat;Therapeutic activities;Functional mobility training;Neuromuscular re-education;Patient/family education;Manual techniques;Dry needling;Taping    PT Next Visit Plan Continue left hip LAD, continue trial gentle flexion  bias ROM exercise progression but OK adjust as needed pending symptoms and response    PT Home Exercise Plan [email or web address]    Consulted and Agree with  Plan of Care Patient           Patient will benefit from skilled therapeutic intervention in order to improve the following deficits and impairments:  Pain, Postural dysfunction, Impaired flexibility, Decreased strength, Decreased activity tolerance, Decreased range of motion, Difficulty walking, Hypomobility, Increased muscle spasms  Visit Diagnosis: Radiculopathy, lumbar region  Muscle weakness (generalized)  Difficulty in walking, not elsewhere classified     Problem List Patient Active Problem List   Diagnosis Date Noted  . Acute cervical radiculopathy 07/09/2020  . Pruritus 02/06/2020  . Raynaud's phenomenon 01/06/2020  . Leg cramping 07/25/2018  . Left wrist tendinitis 04/30/2018  . Left-sided low back pain with sciatica 04/30/2018  . Hand numbness 01/24/2018  . Lumbar radiculopathy 12/23/2016  . Plantar fasciitis of right foot 06/22/2016  . Leg weakness, bilateral 06/22/2016  . Cough 12/19/2014  . Carpal tunnel syndrome of right wrist 03/07/2013  . Myasthenia gravis without exacerbation (HCC) 12/31/2010  . ALOPECIA 12/31/2010  . Hypothyroidism 12/29/2010  . Type 2 diabetes, diet controlled (HCC) 12/29/2010  . Hyperlipidemia 12/29/2010  . Essential hypertension 12/29/2010  . Coronary atherosclerosis 12/29/2010  . OSTEOARTHRITIS 12/29/2010    Rosana Hoes, PT, DPT, LAT, ATC 08/13/20  5:49 PM Phone: [phone] Fax: [phone]   [facility] [address] [place], [place], [address] Phone: [phone]   Fax:  [phone]  Name: Gloria [name] MRN: [id number] Date of Birth: 1951/01/22

## 2020-08-18 ENCOUNTER — Telehealth: Payer: Self-pay | Admitting: Internal Medicine

## 2020-08-18 ENCOUNTER — Other Ambulatory Visit: Payer: Self-pay

## 2020-08-18 ENCOUNTER — Ambulatory Visit: Payer: Medicare HMO | Admitting: Physical Therapy

## 2020-08-18 ENCOUNTER — Encounter: Payer: Self-pay | Admitting: Physical Therapy

## 2020-08-18 DIAGNOSIS — M6281 Muscle weakness (generalized): Secondary | ICD-10-CM | POA: Diagnosis not present

## 2020-08-18 DIAGNOSIS — R262 Difficulty in walking, not elsewhere classified: Secondary | ICD-10-CM

## 2020-08-18 DIAGNOSIS — M5416 Radiculopathy, lumbar region: Secondary | ICD-10-CM | POA: Diagnosis not present

## 2020-08-18 MED ORDER — GABAPENTIN 300 MG PO CAPS
300.0000 mg | ORAL_CAPSULE | Freq: Three times a day (TID) | ORAL | 2 refills | Status: DC
Start: 2020-08-18 — End: 2020-12-10

## 2020-08-18 NOTE — Telephone Encounter (Signed)
Patient was wondering if she could get a med refill on gabapentin (NEURONTIN) 300 MG capsule  Sent to [facility] on [address] [place], [place] [address]

## 2020-08-18 NOTE — Telephone Encounter (Signed)
Reviewed chart pt is up-to-date sent refills to requested pharmacy.../lmb  

## 2020-08-19 ENCOUNTER — Encounter: Payer: Self-pay | Admitting: Physical Therapy

## 2020-08-19 ENCOUNTER — Ambulatory Visit (INDEPENDENT_AMBULATORY_CARE_PROVIDER_SITE_OTHER): Payer: Medicare HMO

## 2020-08-19 VITALS — Ht 66.0 in

## 2020-08-19 DIAGNOSIS — Z0000 Encounter for general adult medical examination without abnormal findings: Secondary | ICD-10-CM | POA: Diagnosis not present

## 2020-08-19 NOTE — Progress Notes (Signed)
I connected with Gloria [name] today by telephone and verified that I am speaking with the correct person using two identifiers. Location patient: home Location provider: work Persons participating in the virtual visit: Adaisha Campise and [employer]. Kunaal Walkins, LPN.   I discussed the limitations, risks, security and privacy concerns of performing an evaluation and management service by telephone and the availability of in person appointments. I also discussed with the patient that there may be a patient responsible charge related to this service. The patient expressed understanding and verbally consented to this telephonic visit.    Interactive audio and video telecommunications were attempted between this provider and patient, however failed, due to patient having technical difficulties OR patient did not have access to video capability.  We continued and completed visit with audio only.  Some vital signs may be absent or patient reported.   Time Spent with patient on telephone encounter: 20 minutes  Subjective:   Gloria [name] is a 69 y.o. female who presents for Medicare Annual (Subsequent) preventive examination.  Review of Systems    No ROS. Medicare Wellness Visit Cardiac Risk Factors include: advanced age (>52men, >37 women);dyslipidemia     Objective:    Today's Vitals   08/19/20 1335 08/19/20 1337  Height: 5\' 6"  (1.676 m)   PainSc:  8    Body mass index is 26.47 kg/m.  Advanced Directives 08/19/2020 07/23/2020 07/11/2020 08/12/2019 07/25/2018 12/12/2016  Does Patient Have a Medical Advance Directive? Yes Yes No Yes Yes No  Type of Advance Directive Living will Living will - Healthcare Power of [place];Living will Healthcare Power of [place];Living will -  Does patient want to make changes to medical advance directive? - No - Patient declined - - - -  Copy of Healthcare Power of Attorney in Chart? - - - No - copy requested No - copy requested -  Would patient like  information on creating a medical advance directive? - - No - Patient declined - - -    Current Medications (verified) Outpatient Encounter Medications as of 08/19/2020  Medication Sig  . amLODipine (NORVASC) 5 MG tablet TAKE 1 TABLET(5 MG) BY MOUTH DAILY  . atorvastatin (LIPITOR) 10 MG tablet TAKE 1 TABLET BY MOUTH DAILY AT 6 PM  . diclofenac sodium (VOLTAREN) 1 % GEL Apply 4 g topically 4 (four) times daily.  10/19/2020 gabapentin (NEURONTIN) 300 MG capsule Take 1 capsule (300 mg total) by mouth 3 (three) times daily.  Marland Kitchen levothyroxine (SYNTHROID) 100 MCG tablet TAKE 1 TABLET BY MOUTH DAILY  . losartan (COZAAR) 100 MG tablet TAKE 1 TABLET(100 MG) BY MOUTH DAILY  . meloxicam (MOBIC) 15 MG tablet TAKE 1 TABLET(15 MG) BY MOUTH DAILY  . methocarbamol (ROBAXIN) 500 MG tablet Take 1 tablet (500 mg total) by mouth every 6 (six) hours as needed for muscle spasms.  Marland Kitchen pyridostigmine (MESTINON) 60 MG tablet Take 60 mg by mouth 3 (three) times daily. Per Neurologist   . ST JOSEPH ASPIRIN PO Take 81 mg by mouth daily.  Marland Kitchen triamcinolone cream (KENALOG) 0.1 % APPLY EXTERNALLY TO THE AFFECTED AREA TWICE DAILY   No facility-administered encounter medications on file as of 08/19/2020.    Allergies (verified) Vicodin [hydrocodone-acetaminophen]   History: Past Medical History:  Diagnosis Date  . Alopecia areata 10/2010 onset  . Diabetes mellitus, type 2 (HCC)   . Dyslipidemia   . Hypertension   . Hypothyroid   . Myasthenia gravis    Past Surgical History:  Procedure Laterality Date  .  ABDOMINAL HYSTERECTOMY  1990   Family History  Adopted: Yes  Problem Relation Age of Onset  . Breast cancer Neg Hx    Social History   Socioeconomic History  . Marital status: Divorced    Spouse name: Not on file  . Number of children: 3  . Years of education: Not on file  . Highest education level: Not on file  Occupational History  . Occupation: retired  Tobacco Use  . Smoking status: Never Smoker  . Smokeless  tobacco: Never Used  Vaping Use  . Vaping Use: Never used  Substance and Sexual Activity  . Alcohol use: No  . Drug use: Never  . Sexual activity: Never  Other Topics Concern  . Not on file  Social History Narrative  . Not on file   Social Determinants of Health   Financial Resource Strain: Low Risk   . Difficulty of Paying Living Expenses: Not hard at all  Food Insecurity: No Food Insecurity  . Worried About Programme researcher, broadcasting/film/video in the Last Year: Never true  . Ran Out of Food in the Last Year: Never true  Transportation Needs: No Transportation Needs  . Lack of Transportation (Medical): No  . Lack of Transportation (Non-Medical): No  Physical Activity: Inactive  . Days of Exercise per Week: 0 days  . Minutes of Exercise per Session: 0 min  Stress: No Stress Concern Present  . Feeling of Stress : Only a little  Social Connections:   . Frequency of Communication with Friends and Family: Not on file  . Frequency of Social Gatherings with Friends and Family: Not on file  . Attends Religious Services: Not on file  . Active Member of Clubs or Organizations: Not on file  . Attends Banker Meetings: Not on file  . Marital Status: Not on file    Tobacco Counseling Counseling given: Not Answered   Clinical Intake:  Pre-visit preparation completed: Yes  Pain : 0-10 Pain Score: 8  Pain Type: Acute pain Pain Location: Back Pain Orientation: Left, Lower Pain Radiating Towards: left lateral leg to shin/calf region; left knee Pain Descriptors / Indicators: Sharp Pain Onset: More than a month ago Pain Frequency: Constant     Nutritional Risks: None Diabetes: No  How often do you need to have someone help you when you read instructions, pamphlets, or other written materials from your doctor or pharmacy?: 1 - Never What is the last grade level you completed in school?: College Graduate  Diabetic? no  Interpreter Needed?: No  Information entered by ::  Julliette Frentz N. Kewan Mcnease, LPN   Activities of Daily Living In your present state of health, do you have any difficulty performing the following activities: 08/19/2020  Hearing? N  Vision? N  Difficulty concentrating or making decisions? N  Walking or climbing stairs? N  Dressing or bathing? N  Doing errands, shopping? N  Preparing Food and eating ? N  Using the Toilet? N  In the past six months, have you accidently leaked urine? N  Do you have problems with loss of bowel control? N  Managing your Medications? N  Managing your Finances? N  Housekeeping or managing your Housekeeping? N  Some recent data might be hidden    Patient Care Team: Pincus Sanes, MD as PCP - General (Internal Medicine) Caress, Fayrene Fearing, MD (Neurology) Helane Gunther, DPM (Podiatry) Janalyn Harder, MD (Dermatology) Vida Rigger, MD (Gastroenterology) Sallye Lat, MD as Consulting Physician (Ophthalmology)  Indicate any recent Medical Services  you may have received from other than [facility] providers in the past year (date may be approximate).     Assessment:   This is a routine wellness examination for Gloria [name].  Hearing/Vision screen No exam data present  Dietary issues and exercise activities discussed: Current Exercise Habits: The patient does not participate in regular exercise at present, Exercise limited by: orthopedic condition(s);Other - see comments (pinch nerve in lower back radiating to left knee)  Goals    . Patient Stated     Stay as healthy and as independent as possible. Continue eat healthy, exercise, enjoy life and love my family.      Depression Screen PHQ 2/9 Scores 08/19/2020 01/06/2020 08/12/2019 07/25/2018 01/24/2018 06/22/2016 07/22/2013  PHQ - 2 Score 1 0 0 0 0 0 0    Fall Risk Fall Risk  08/19/2020 01/06/2020 11/06/2019 08/12/2019 07/25/2018  Falls in the past year? 0 0 0 0 No  Comment - - Emmi Telephone Survey: data to providers prior to load - -  Number falls in past yr: 0 0 - 0 -    Injury with Fall? 0 - - 0 -  Risk for fall due to : Impaired mobility - - - -  Risk for fall due to: Comment patient has a pinch nerve - - - -  Follow up Falls evaluation completed - - - -    Any stairs in or around the home? Yes  If so, are there any without handrails? No  Home free of loose throw rugs in walkways, pet beds, electrical cords, etc? Yes  Adequate lighting in your home to reduce risk of falls? Yes   ASSISTIVE DEVICES UTILIZED TO PREVENT FALLS:  Life alert? No  Use of a cane, walker or w/c? No  Grab bars in the bathroom? No  Shower chair or bench in shower? No  Elevated toilet seat or a handicapped toilet? No   TIMED UP AND GO:  Was the test performed? No .  Length of time to ambulate 10 feet: 0 sec.   Gait slow and steady without use of assistive device  Cognitive Function:        Immunizations Immunization History  Administered Date(s) Administered  . Influenza Split 09/05/2012  . Influenza,inj,Quad PF,6+ Mos 11/28/2013, 10/17/2014  . PFIZER SARS-COV-2 Vaccination 01/23/2020, 02/17/2020  . Pneumococcal Conjugate-13 01/06/2020  . Pneumococcal Polysaccharide-23 09/05/2012  . Td 12/12/2009    TDAP status: Due, Education has been provided regarding the importance of this vaccine. Advised may receive this vaccine at local pharmacy or Health Dept. Aware to provide a copy of the vaccination record if obtained from local pharmacy or Health Dept. Verbalized acceptance and understanding. Flu Vaccine status: Declined, Education has been provided regarding the importance of this vaccine but patient still declined. Advised may receive this vaccine at local pharmacy or Health Dept. Aware to provide a copy of the vaccination record if obtained from local pharmacy or Health Dept. Verbalized acceptance and understanding. Pneumococcal vaccine status: Up to date Covid-19 vaccine status: Completed vaccines  Qualifies for Shingles Vaccine? Yes   Zostavax completed No    Shingrix Completed?: No.    Education has been provided regarding the importance of this vaccine. Patient has been advised to call insurance company to determine out of pocket expense if they have not yet received this vaccine. Advised may also receive vaccine at local pharmacy or Health Dept. Verbalized acceptance and understanding.  Screening Tests Health Maintenance  Topic Date Due  . OPHTHALMOLOGY EXAM  06/29/2018  . FOOT EXAM  01/24/2019  . TETANUS/TDAP  12/13/2019  . INFLUENZA VACCINE  07/12/2020  . HEMOGLOBIN A1C  08/05/2020  . PNA vac Low Risk Adult (2 of 2 - PPSV23) 01/05/2021  . COLONOSCOPY  09/04/2021  . MAMMOGRAM  12/19/2021  . DEXA SCAN  02/01/2022  . COVID-19 Vaccine  Completed  . Hepatitis C Screening  Completed    Health Maintenance  Health Maintenance Due  Topic Date Due  . OPHTHALMOLOGY EXAM  06/29/2018  . FOOT EXAM  01/24/2019  . TETANUS/TDAP  12/13/2019  . INFLUENZA VACCINE  07/12/2020  . HEMOGLOBIN A1C  08/05/2020    Colorectal cancer screening: Completed 09/05/2011. Repeat every 10 years Mammogram status: Completed 12/20/2019. Repeat every year Bone Density status: Completed 02/01/2017. Results reflect: Bone density results: NORMAL. Repeat every 2-3 years.  Lung Cancer Screening: (Low Dose CT Chest recommended if Age 57-80 years, 30 pack-year currently smoking OR have quit w/in 15years.) does not qualify.   Lung Cancer Screening Referral: no  Additional Screening:  Hepatitis C Screening: does qualify; Completed: yes  Vision Screening: Recommended annual ophthalmology exams for early detection of glaucoma and other disorders of the eye. Is the patient up to date with their annual eye exam?  Yes  Who is the provider or what is the name of the office in which the patient attends annual eye exams? Marchelle Gearinghris Groat, MD If pt is not established with a provider, would they like to be referred to a provider to establish care? No .   Dental Screening: Recommended  annual dental exams for proper oral hygiene  Community Resource Referral / Chronic Care Management: CRR required this visit?  No   CCM required this visit?  No      Plan:     I have personally reviewed and noted the following in the patient's chart:   . Medical and social history . Use of alcohol, tobacco or illicit drugs  . Current medications and supplements . Functional ability and status . Nutritional status . Physical activity . Advanced directives . List of other physicians . Hospitalizations, surgeries, and ER visits in previous 12 months . Vitals . Screenings to include cognitive, depression, and falls . Referrals and appointments  In addition, I have reviewed and discussed with patient certain preventive protocols, quality metrics, and best practice recommendations. A written personalized care plan for preventive services as well as general preventive health recommendations were provided to patient.     Mickeal NeedyShenika N Azyriah Nevins, LPN   1/6/10969/07/2020   Nurse Notes:  Patient is cogitatively intact. There were no vitals filed for this visit. There is no height or weight on file to calculate BMI. Patient stated that she is having issues with gait/balance due to pinch nerve in back.

## 2020-08-19 NOTE — Therapy (Signed)
[facility] [address] [place], [place], [address] Phone: [phone]   Fax:  [phone]  Physical Therapy Treatment  Patient Details  Name: Gloria [name] MRN: [id number] Date of Birth: [birth year] Referring Provider (PT): Cheryll Cockayne, MD   Encounter Date: 08/18/2020   PT End of Session - 08/18/20 1628    Visit Number 5    Number of Visits 12    Date for PT Re-Evaluation 09/03/20    Authorization Type [employer] MCR    Authorization Time Period 07/23/20-08/28/20    Authorization - Visit Number 5    Authorization - Number of Visits 12    PT Start Time 1617    PT Stop Time 1710    PT Time Calculation (min) 53 min    Activity Tolerance Patient limited by pain    Behavior During Therapy [facility] for tasks assessed/performed           Past Medical History:  Diagnosis Date  . Alopecia areata 10/2010 onset  . Diabetes mellitus, type 2 (HCC)   . Dyslipidemia   . Hypertension   . Hypothyroid   . Myasthenia gravis     Past Surgical History:  Procedure Laterality Date  . ABDOMINAL HYSTERECTOMY  1990    There were no vitals filed for this visit.   Subjective Assessment - 08/18/20 1626    Subjective Patient reports left knee and down the lower leg have been bothering her a lot today. She may have done a little more than usual over the weekend but other than than not sure why it is worse. She does note she is able to sleep longer which has helped a little bit. Overall patient reports continued improvement but still seems to be limited with mobility.    Patient Stated Goals Get rid of back/leg pain    Currently in Pain? Yes    Pain Score 8     Pain Location Back    Pain Orientation Left;Lower    Pain Descriptors / Indicators Sharp    Pain Type Acute pain    Pain Radiating Towards left lateral leg distally to shin/calf region, left knee    Pain Onset More than a month ago    Pain Frequency Constant              [facility]  PT Assessment - 08/19/20 0001      AROM   Overall AROM Comments Increased pain with all lumbar movement, flexion continues to seem like a mild direction preference but with no true centrilazation                         [facility] Adult PT Treatment/Exercise - 08/19/20 0001      Exercises   Exercises Lumbar      Lumbar Exercises: Stretches   Single Knee to Chest Stretch 3 reps    Single Knee to Chest Stretch Limitations PROM, inconsistent with hold times due to pain level    Lower Trunk Rotation 5 reps;10 seconds    Lower Trunk Rotation Limitations Partial range to left, full range to right    Pelvic Tilt 5 reps   2 sets     Lumbar Exercises: Aerobic   Nustep L1 x 4 min to patient tolerance      Lumbar Exercises: Sidelying   Clam 10 reps      Modalities   Modalities Electrical Stimulation;Cryotherapy      Cryotherapy   Number Minutes Cryotherapy  15 Minutes    Cryotherapy Location Lumbar Spine   left lower back, hip, lateral leg down to calf   Type of Cryotherapy Ice pack      Electrical Stimulation   Electrical Stimulation Location Left lumbar and upper gluteal region    Electrical Stimulation Action IFC 80-150 x 15 min    Electrical Stimulation Parameters Patient tolerance for intensity    Electrical Stimulation Goals Pain      Manual Therapy   Manual Therapy Joint mobilization;Soft tissue mobilization;Myofascial release    Joint Mobilization Left hip LAD with gentle oscillations for multiple bouts    Soft tissue mobilization FR to lateral gluteal region/hip, lateral thigh and    Myofascial Release TPR left lower lumbar and upper gluteal region                  PT Education - 08/18/20 1628    Education Details HEP    Person(s) Educated Patient    Methods Explanation    Comprehension Verbalized understanding;Need further instruction            PT Short Term Goals - 08/13/20 1735      PT SHORT TERM GOAL #1   Title Independent with initial  HEP    Baseline Patient inconsistent with hep due to pain level    Time 3    Period Weeks    Status On-going    Target Date 08/13/20      PT SHORT TERM GOAL #2   Title Return to sleeping in bed/no further need to sleep in recliner    Baseline Patient continues to sleep in recliner, states better sleep last night    Time 3    Period Weeks    Status On-going    Target Date 08/13/20             PT Long Term Goals - 07/23/20 2151      PT LONG TERM GOAL #1   Title Independent with advanced HEP for continued progress after d/c from therapy    Time 6    Period Weeks    Status New    Target Date 09/03/20      PT LONG TERM GOAL #2   Title Improve FOTO outcome measure score to 44% or less impairment    Baseline 70% limited    Time 6    Period Weeks    Status New    Target Date 09/03/20      PT LONG TERM GOAL #3   Title Increase trunk flexion AROM at least 20 deg to improve ability for bending for chores and activities such as donning shoes    Baseline 50 deg    Time 6    Period Weeks    Status New    Target Date 09/03/20      PT LONG TERM GOAL #4   Title Tolerate standing/ambulation for periods at least 20-30 min for activities such as grocery shopping with LBP/LLE pain 3/10 or less    Baseline 8/10    Time 6    Period Weeks    Status New    Target Date 09/03/20      PT LONG TERM GOAL #5   Title Increase left LE strength at least grossly 1/2 MMT grade to improve ability for stair navigation and lifting for chores    Baseline see objective    Time 6    Period Weeks    Status New    Target Date 09/03/20  Plan - 08/18/20 1630    Clinical Impression Statement Patient continues to be limited by left lower back and radicular pain in left leg and is unable to tolerate much therapy. Continued to use manual therapy to reduce pain, did more TPR to lower lumbar and upper gluteal region this visit. Continued therex for light core activation to  tolerance. Use of e-stim and cryo following therapy for pain control. Patient continues to note gradual improve but not making much progress with therex or mobility, still unclear exact etilogy but patient has bee ordered for MRI but has not scheduled. Patient would benefit from continued skilled PT to reduce pain and progress mobility to return to prior level of function.    PT Treatment/Interventions ADLs/Self Care Home Management;Cryotherapy;Traction;Therapeutic exercise;Ultrasound;Electrical Stimulation;Moist Heat;Therapeutic activities;Functional mobility training;Neuromuscular re-education;Patient/family education;Manual techniques;Dry needling;Taping    PT Next Visit Plan Continue left hip LAD, continue trial gentle flexion bias ROM exercise progression but OK adjust as needed pending symptoms and response    PT Home Exercise Plan [email or web address]    Consulted and Agree with Plan of Care Patient           Patient will benefit from skilled therapeutic intervention in order to improve the following deficits and impairments:  Pain, Postural dysfunction, Impaired flexibility, Decreased strength, Decreased activity tolerance, Decreased range of motion, Difficulty walking, Hypomobility, Increased muscle spasms  Visit Diagnosis: Radiculopathy, lumbar region  Muscle weakness (generalized)  Difficulty in walking, not elsewhere classified     Problem List Patient Active Problem List   Diagnosis Date Noted  . Acute cervical radiculopathy 07/09/2020  . Pruritus 02/06/2020  . Raynaud's phenomenon 01/06/2020  . Leg cramping 07/25/2018  . Left wrist tendinitis 04/30/2018  . Left-sided low back pain with sciatica 04/30/2018  . Hand numbness 01/24/2018  . Lumbar radiculopathy 12/23/2016  . Plantar fasciitis of right foot 06/22/2016  . Leg weakness, bilateral 06/22/2016  . Cough 12/19/2014  . Carpal tunnel syndrome of right wrist 03/07/2013  . Myasthenia gravis without exacerbation (HCC) 12/31/2010   . ALOPECIA 12/31/2010  . Hypothyroidism 12/29/2010  . Type 2 diabetes, diet controlled (HCC) 12/29/2010  . Hyperlipidemia 12/29/2010  . Essential hypertension 12/29/2010  . Coronary atherosclerosis 12/29/2010  . OSTEOARTHRITIS 12/29/2010    Rosana Hoes, PT, DPT, LAT, ATC 08/19/20  8:20 AM Phone: [phone] Fax: [phone]   [facility] [address] [place], [place], [address] Phone: [phone]   Fax:  [phone]  Name: Emmalee Solivan MRN: [id number] Date of Birth: [birth year]

## 2020-08-19 NOTE — Progress Notes (Signed)
Subjective:    Patient ID: Gloria [name], female    DOB: 10-07-1951, 69 y.o.   MRN: [id number]  HPI The patient is here for follow up of their chronic medical problems, including htn, hypothyroidism, DM, hyperlipidemia  She saw ortho for her lower back pain with sciatica and is doing PT.  She will have an MRI.  She has been taking the meloxicam daily, droans otc medication for pain and the gabapentin 4 times a day.    She lost her best friend on 8/20 - it was unexpected.    She wants to get a life alert for home and out of home for her pinched nerve and myasthenia gravis. She needs paperwork filled out for this for her insurance.    Medications and allergies reviewed with patient and updated if appropriate.  Patient Active Problem List   Diagnosis Date Noted  . Bradycardia 08/20/2020  . Acute cervical radiculopathy 07/09/2020  . Pruritus 02/06/2020  . Raynaud's phenomenon 01/06/2020  . Leg cramping 07/25/2018  . Left wrist tendinitis 04/30/2018  . Left-sided low back pain with sciatica 04/30/2018  . Hand numbness 01/24/2018  . Lumbar radiculopathy 12/23/2016  . Plantar fasciitis of right foot 06/22/2016  . Leg weakness, bilateral 06/22/2016  . Cough 12/19/2014  . Carpal tunnel syndrome of right wrist 03/07/2013  . Myasthenia gravis without exacerbation (HCC) 12/31/2010  . ALOPECIA 12/31/2010  . Hypothyroidism 12/29/2010  . Type 2 diabetes, diet controlled (HCC) 12/29/2010  . Hyperlipidemia 12/29/2010  . Essential hypertension 12/29/2010  . Coronary atherosclerosis 12/29/2010  . OSTEOARTHRITIS 12/29/2010    Current Outpatient Medications on File Prior to Visit  Medication Sig Dispense Refill  . amLODipine (NORVASC) 5 MG tablet TAKE 1 TABLET(5 MG) BY MOUTH DAILY 90 tablet 1  . atorvastatin (LIPITOR) 10 MG tablet TAKE 1 TABLET BY MOUTH DAILY AT 6 PM 90 tablet 0  . diclofenac sodium (VOLTAREN) 1 % GEL Apply 4 g topically 4 (four) times daily. 100 g 5  .  gabapentin (NEURONTIN) 300 MG capsule Take 1 capsule (300 mg total) by mouth 3 (three) times daily. 90 capsule 2  . levothyroxine (SYNTHROID) 100 MCG tablet TAKE 1 TABLET BY MOUTH DAILY 90 tablet 0  . losartan (COZAAR) 100 MG tablet TAKE 1 TABLET(100 MG) BY MOUTH DAILY 90 tablet 1  . meloxicam (MOBIC) 15 MG tablet TAKE 1 TABLET(15 MG) BY MOUTH DAILY 90 tablet 1  . pyridostigmine (MESTINON) 60 MG tablet Take 60 mg by mouth 3 (three) times daily. Per Neurologist     . ST JOSEPH ASPIRIN PO Take 81 mg by mouth daily.    Marland Kitchen triamcinolone cream (KENALOG) 0.1 % APPLY EXTERNALLY TO THE AFFECTED AREA TWICE DAILY 30 g 0   No current facility-administered medications on file prior to visit.    Past Medical History:  Diagnosis Date  . Alopecia areata 10/2010 onset  . Diabetes mellitus, type 2 (HCC)   . Dyslipidemia   . Hypertension   . Hypothyroid   . Myasthenia gravis     Past Surgical History:  Procedure Laterality Date  . ABDOMINAL HYSTERECTOMY  1990    Social History   Socioeconomic History  . Marital status: Divorced    Spouse name: Not on file  . Number of children: 3  . Years of education: Not on file  . Highest education level: Not on file  Occupational History  . Occupation: retired  Tobacco Use  . Smoking status: Never Smoker  . Smokeless tobacco:  Never Used  Vaping Use  . Vaping Use: Never used  Substance and Sexual Activity  . Alcohol use: No  . Drug use: Never  . Sexual activity: Never  Other Topics Concern  . Not on file  Social History Narrative  . Not on file   Social Determinants of Health   Financial Resource Strain: Low Risk   . Difficulty of Paying Living Expenses: Not hard at all  Food Insecurity: No Food Insecurity  . Worried About Programme researcher, broadcasting/film/video in the Last Year: Never true  . Ran Out of Food in the Last Year: Never true  Transportation Needs: No Transportation Needs  . Lack of Transportation (Medical): No  . Lack of Transportation  (Non-Medical): No  Physical Activity: Inactive  . Days of Exercise per Week: 0 days  . Minutes of Exercise per Session: 0 min  Stress: No Stress Concern Present  . Feeling of Stress : Only a little  Social Connections:   . Frequency of Communication with Friends and Family: Not on file  . Frequency of Social Gatherings with Friends and Family: Not on file  . Attends Religious Services: Not on file  . Active Member of Clubs or Organizations: Not on file  . Attends Banker Meetings: Not on file  . Marital Status: Not on file    Family History  Adopted: Yes  Problem Relation Age of Onset  . Breast cancer Neg Hx     Review of Systems  Constitutional: Negative for chills and fever.  HENT: Negative for trouble swallowing.   Respiratory: Negative for cough, shortness of breath and wheezing.   Cardiovascular: Negative for chest pain, palpitations and leg swelling.  Musculoskeletal: Positive for back pain.  Neurological: Positive for dizziness. Negative for light-headedness and headaches.       Objective:   Vitals:   08/20/20 1124  BP: 128/72  Pulse: (!) 43  Temp: 98.3 F (36.8 C)  SpO2: 97%   BP Readings from Last 3 Encounters:  08/20/20 128/72  07/22/20 124/82  07/14/20 (!) 146/72   Wt Readings from Last 3 Encounters:  08/20/20 170 lb 6.4 oz (77.3 kg)  07/22/20 164 lb (74.4 kg)  07/14/20 165 lb (74.8 kg)   Body mass index is 27.5 kg/m.   Physical Exam    Constitutional: Appears well-developed and well-nourished. No distress.  HENT:  Head: Normocephalic and atraumatic.  Neck: Neck supple. No tracheal deviation present. No thyromegaly present.  No cervical lymphadenopathy Cardiovascular: Normal rate, regular rhythm and normal heart sounds.   No murmur heard. No carotid bruit .  No edema Pulmonary/Chest: Effort normal and breath sounds normal. No respiratory distress. No has no wheezes. No rales.  Skin: Skin is warm and dry. Not diaphoretic.   Psychiatric: Normal mood and affect. Behavior is normal.      Assessment & Plan:    See Problem List for Assessment and Plan of chronic medical problems.    This visit occurred during the SARS-CoV-2 public health emergency.  Safety protocols were in place, including screening questions prior to the visit, additional usage of staff PPE, and extensive cleaning of exam room while observing appropriate contact time as indicated for disinfecting solutions.

## 2020-08-19 NOTE — Patient Instructions (Addendum)
  Blood work was ordered.     Medications reviewed and updated.  Changes include :   Stop Doans pain medication.     Please followup in 6 months

## 2020-08-19 NOTE — Patient Instructions (Addendum)
Gloria [name] , Thank you for taking time to come for your Medicare Wellness Visit. I appreciate your ongoing commitment to your health goals. Please review the following plan we discussed and let me know if I can assist you in the future.   Screening recommendations/referrals: Colonoscopy: 09/05/2011; due every 10 years Mammogram: 12/20/2019; due every year Bone Density: 02/01/2017; due every 2-3 years Recommended yearly ophthalmology/optometry visit for glaucoma screening and checkup Recommended yearly dental visit for hygiene and checkup  Vaccinations: Influenza vaccine: declined Pneumococcal vaccine: completed Tdap vaccine: overdue Shingles vaccine: declined   Covid-19: completed  Advanced directives: Please bring a copy of your health care power of attorney and living will to the office at your convenience.  Conditions/risks identified: Yes; Reviewed health maintenance screenings with patient today and relevant education, vaccines, and/or referrals were provided. Please continue to do your personal lifestyle choices by: daily care of teeth and gums, regular physical activity (goal should be 5 days a week for 30 minutes), eat a healthy diet, avoid tobacco and drug use, limiting any alcohol intake, taking a low-dose aspirin (if not allergic or have been advised by your provider otherwise) and taking vitamins and minerals as recommended by your provider. Continue doing brain stimulating activities (puzzles, reading, adult coloring books, staying active) to keep memory sharp. Continue to eat heart healthy diet (full of fruits, vegetables, whole grains, lean protein, water--limit salt, fat, and sugar intake) and increase physical activity as tolerated.  Next appointment: Please schedule your next Medicare Wellness Visit with your Nurse Health Advisor in 1 year.   Preventive Care 69 Years and Older, Female Preventive care refers to lifestyle choices and visits with your health care provider that can  promote health and wellness. What does preventive care include?  A yearly physical exam. This is also called an annual well check.  Dental exams once or twice a year.  Routine eye exams. Ask your health care provider how often you should have your eyes checked.  Personal lifestyle choices, including:  Daily care of your teeth and gums.  Regular physical activity.  Eating a healthy diet.  Avoiding tobacco and drug use.  Limiting alcohol use.  Practicing safe sex.  Taking low-dose aspirin every day.  Taking vitamin and mineral supplements as recommended by your health care provider. What happens during an annual well check? The services and screenings done by your health care provider during your annual well check will depend on your age, overall health, lifestyle risk factors, and family history of disease. Counseling  Your health care provider may ask you questions about your:  Alcohol use.  Tobacco use.  Drug use.  Emotional well-being.  Home and relationship well-being.  Sexual activity.  Eating habits.  History of falls.  Memory and ability to understand (cognition).  Work and work Astronomer.  Reproductive health. Screening  You may have the following tests or measurements:  Height, weight, and BMI.  Blood pressure.  Lipid and cholesterol levels. These may be checked every 5 years, or more frequently if you are over 69 years old.  Skin check.  Lung cancer screening. You may have this screening every year starting at age 69 if you have a 30-pack-year history of smoking and currently smoke or have quit within the past 15 years.  Fecal occult blood test (FOBT) of the stool. You may have this test every year starting at age 69.  Flexible sigmoidoscopy or colonoscopy. You may have a sigmoidoscopy every 5 years or a colonoscopy every  10 years starting at age 69.  Hepatitis C blood test.  Hepatitis B blood test.  Sexually transmitted disease  (STD) testing.  Diabetes screening. This is done by checking your blood sugar (glucose) after you have not eaten for a while (fasting). You may have this done every 1-3 years.  Bone density scan. This is done to screen for osteoporosis. You may have this done starting at age 69.  Mammogram. This may be done every 1-2 years. Talk to your health care provider about how often you should have regular mammograms. Talk with your health care provider about your test results, treatment options, and if necessary, the need for more tests. Vaccines  Your health care provider may recommend certain vaccines, such as:  Influenza vaccine. This is recommended every year.  Tetanus, diphtheria, and acellular pertussis (Tdap, Td) vaccine. You may need a Td booster every 10 years.  Zoster vaccine. You may need this after age 69.  Pneumococcal 13-valent conjugate (PCV13) vaccine. One dose is recommended after age 69.  Pneumococcal polysaccharide (PPSV23) vaccine. One dose is recommended after age 69. Talk to your health care provider about which screenings and vaccines you need and how often you need them. This information is not intended to replace advice given to you by your health care provider. Make sure you discuss any questions you have with your health care provider. Document Released: 12/25/2015 Document Revised: 08/17/2016 Document Reviewed: 09/29/2015 Elsevier Interactive Patient Education  2017 [employer] Prevention in the Home Falls can cause injuries. They can happen to people of all ages. There are many things you can do to make your home safe and to help prevent falls. What can I do on the outside of my home?  Regularly fix the edges of walkways and driveways and fix any cracks.  Remove anything that might make you trip as you walk through a door, such as a raised step or threshold.  Trim any bushes or trees on the path to your home.  Use bright outdoor lighting.  Clear any  walking paths of anything that might make someone trip, such as rocks or tools.  Regularly check to see if handrails are loose or broken. Make sure that both sides of any steps have handrails.  Any raised decks and porches should have guardrails on the edges.  Have any leaves, snow, or ice cleared regularly.  Use sand or salt on walking paths during winter.  Clean up any spills in your garage right away. This includes oil or grease spills. What can I do in the bathroom?  Use night lights.  Install grab bars by the toilet and in the tub and shower. Do not use towel bars as grab bars.  Use non-skid mats or decals in the tub or shower.  If you need to sit down in the shower, use a plastic, non-slip stool.  Keep the floor dry. Clean up any water that spills on the floor as soon as it happens.  Remove soap buildup in the tub or shower regularly.  Attach bath mats securely with double-sided non-slip rug tape.  Do not have throw rugs and other things on the floor that can make you trip. What can I do in the bedroom?  Use night lights.  Make sure that you have a light by your bed that is easy to reach.  Do not use any sheets or blankets that are too big for your bed. They should not hang down onto the floor.  Have a firm chair that has side arms. You can use this for support while you get dressed.  Do not have throw rugs and other things on the floor that can make you trip. What can I do in the kitchen?  Clean up any spills right away.  Avoid walking on wet floors.  Keep items that you use a lot in easy-to-reach places.  If you need to reach something above you, use a strong step stool that has a grab bar.  Keep electrical cords out of the way.  Do not use floor polish or wax that makes floors slippery. If you must use wax, use non-skid floor wax.  Do not have throw rugs and other things on the floor that can make you trip. What can I do with my stairs?  Do not leave  any items on the stairs.  Make sure that there are handrails on both sides of the stairs and use them. Fix handrails that are broken or loose. Make sure that handrails are as long as the stairways.  Check any carpeting to make sure that it is firmly attached to the stairs. Fix any carpet that is loose or worn.  Avoid having throw rugs at the top or bottom of the stairs. If you do have throw rugs, attach them to the floor with carpet tape.  Make sure that you have a light switch at the top of the stairs and the bottom of the stairs. If you do not have them, ask someone to add them for you. What else can I do to help prevent falls?  Wear shoes that:  Do not have high heels.  Have rubber bottoms.  Are comfortable and fit you well.  Are closed at the toe. Do not wear sandals.  If you use a stepladder:  Make sure that it is fully opened. Do not climb a closed stepladder.  Make sure that both sides of the stepladder are locked into place.  Ask someone to hold it for you, if possible.  Clearly mark and make sure that you can see:  Any grab bars or handrails.  First and last steps.  Where the edge of each step is.  Use tools that help you move around (mobility aids) if they are needed. These include:  Canes.  Walkers.  Scooters.  Crutches.  Turn on the lights when you go into a dark area. Replace any light bulbs as soon as they burn out.  Set up your furniture so you have a clear path. Avoid moving your furniture around.  If any of your floors are uneven, fix them.  If there are any pets around you, be aware of where they are.  Review your medicines with your doctor. Some medicines can make you feel dizzy. This can increase your chance of falling. Ask your doctor what other things that you can do to help prevent falls. This information is not intended to replace advice given to you by your health care provider. Make sure you discuss any questions you have with your  health care provider. Document Released: 09/24/2009 Document Revised: 05/05/2016 Document Reviewed: 01/02/2015 Elsevier Interactive Patient Education  2017 [employer].

## 2020-08-20 ENCOUNTER — Other Ambulatory Visit: Payer: Self-pay | Admitting: Internal Medicine

## 2020-08-20 ENCOUNTER — Encounter: Payer: Self-pay | Admitting: Physical Therapy

## 2020-08-20 ENCOUNTER — Encounter: Payer: Self-pay | Admitting: Internal Medicine

## 2020-08-20 ENCOUNTER — Ambulatory Visit (INDEPENDENT_AMBULATORY_CARE_PROVIDER_SITE_OTHER): Payer: Medicare HMO | Admitting: Internal Medicine

## 2020-08-20 ENCOUNTER — Ambulatory Visit: Payer: Medicare HMO | Admitting: Physical Therapy

## 2020-08-20 ENCOUNTER — Other Ambulatory Visit: Payer: Self-pay

## 2020-08-20 VITALS — BP 128/72 | HR 43 | Temp 98.3°F | Wt 170.4 lb

## 2020-08-20 DIAGNOSIS — R001 Bradycardia, unspecified: Secondary | ICD-10-CM

## 2020-08-20 DIAGNOSIS — E039 Hypothyroidism, unspecified: Secondary | ICD-10-CM

## 2020-08-20 DIAGNOSIS — I10 Essential (primary) hypertension: Secondary | ICD-10-CM | POA: Diagnosis not present

## 2020-08-20 DIAGNOSIS — M5416 Radiculopathy, lumbar region: Secondary | ICD-10-CM | POA: Diagnosis not present

## 2020-08-20 DIAGNOSIS — R262 Difficulty in walking, not elsewhere classified: Secondary | ICD-10-CM

## 2020-08-20 DIAGNOSIS — E119 Type 2 diabetes mellitus without complications: Secondary | ICD-10-CM

## 2020-08-20 DIAGNOSIS — M6281 Muscle weakness (generalized): Secondary | ICD-10-CM

## 2020-08-20 DIAGNOSIS — E7849 Other hyperlipidemia: Secondary | ICD-10-CM | POA: Diagnosis not present

## 2020-08-20 NOTE — Assessment & Plan Note (Signed)
Chronic Check lipid panel  Continue daily statin Regular exercise and healthy diet encouraged  

## 2020-08-20 NOTE — Assessment & Plan Note (Signed)
Chronic Diet controlled Check a1c Low sugar / carb diet Stressed regular exercise   

## 2020-08-20 NOTE — Assessment & Plan Note (Signed)
Chronic BP well controlled Current regimen effective and well tolerated Continue current medications at current doses cmp  

## 2020-08-20 NOTE — Therapy (Signed)
[facility] [address] [place], [place], [address] Phone: [phone]   Fax:  [phone]  Physical Therapy Treatment  Patient Details  Name: Gloria [name] MRN: [id number] Date of Birth: [birth year] Referring Provider (PT): Cheryll Cockayne, MD   Encounter Date: 08/20/2020   PT End of Session - 08/20/20 1720    Visit Number 6    Number of Visits 12    Date for PT Re-Evaluation 09/03/20    Authorization Type [employer] MCR    Authorization Time Period 07/23/20-08/28/20    Authorization - Visit Number 6    Authorization - Number of Visits 12    PT Start Time 1628    PT Stop Time 1729    PT Time Calculation (min) 61 min    Activity Tolerance Patient limited by pain    Behavior During Therapy [facility] for tasks assessed/performed           Past Medical History:  Diagnosis Date  . Alopecia areata 10/2010 onset  . Diabetes mellitus, type 2 (HCC)   . Dyslipidemia   . Hypertension   . Hypothyroid   . Myasthenia gravis     Past Surgical History:  Procedure Laterality Date  . ABDOMINAL HYSTERECTOMY  1990    There were no vitals filed for this visit.   Subjective Assessment - 08/20/20 1632    Subjective 6/10 pain pre-tx. with pain radiating from back into left leg-mostly at knee with c/o "cramping" sensation but intermittently with pain into shin region. Pt. saw PCP earlier today and plan for now is to continue PT. Still pending scheduling of lumbar MRI.    Pertinent History previous history lumbar radiculopathy, diabetic, myasthenia gravis    Limitations Sitting;House hold activities;Lifting;Standing;Walking    Diagnostic tests previous X-rays    Patient Stated Goals Get rid of back/leg pain              [facility] PT Assessment - 08/20/20 0001      Observation/Other Assessments   Focus on Therapeutic Outcomes (FOTO)  57% limited                         [facility] Adult PT Treatment/Exercise - 08/20/20 0001       Lumbar Exercises: Stretches   Passive Hamstring Stretch Left;3 reps;10 seconds    Single Knee to Chest Stretch Left    Single Knee to Chest Stretch Limitations 10 reps PROM 5 sec holds    Double Knee to Chest Stretch Limitations DKTC x 15 reps with therapist assist with legs on 55 cm P-ball    Lower Trunk Rotation Limitations 10 reps with legs on 55 cm P-ball, therapist assistance    Pelvic Tilt 15 reps    Pelvic Tilt Limitations 3-5 sec holds    Piriformis Stretch Left;3 reps;20 seconds      Lumbar Exercises: Seated   Other Seated Lumbar Exercises seated trunk flexion with P-ball roll out, 85 cm ball x 15 reps      Cryotherapy   Number Minutes Cryotherapy 15 Minutes    Cryotherapy Location Lumbar Spine   left lower back, hip, lateral leg down to calf   Type of Cryotherapy Ice pack      Electrical Stimulation   Electrical Stimulation Location Left lumbar and upper gluteal region    Electrical Stimulation Action IFC 80-150 HZ x 15 min with cryo    Electrical Stimulation Parameters to patient tolerance    Electrical Stimulation Goals  Pain      Manual Therapy   Joint Mobilization Left hip LAD with gentle oscillations for multiple bouts    Soft tissue mobilization FR to lateral gluteal region/hip, lateral thigh and                  PT Education - 08/20/20 1719    Education Details HEP, POC    Person(s) Educated Patient    Methods Explanation;Demonstration;Verbal cues    Comprehension Verbalized understanding            PT Short Term Goals - 08/13/20 1735      PT SHORT TERM GOAL #1   Title Independent with initial HEP    Baseline Patient inconsistent with hep due to pain level    Time 3    Period Weeks    Status On-going    Target Date 08/13/20      PT SHORT TERM GOAL #2   Title Return to sleeping in bed/no further need to sleep in recliner    Baseline Patient continues to sleep in recliner, states better sleep last night    Time 3    Period Weeks    Status  On-going    Target Date 08/13/20             PT Long Term Goals - 08/20/20 1736      PT LONG TERM GOAL #1   Title Independent with advanced HEP for continued progress after d/c from therapy    Baseline ongoing for updates    Time 6    Period Weeks    Status On-going      PT LONG TERM GOAL #2   Title Improve FOTO outcome measure score to 44% or less impairment    Baseline 57% limited 08/20/20    Time 6    Period Weeks    Status On-going      PT LONG TERM GOAL #3   Title Increase trunk flexion AROM at least 20 deg to improve ability for bending for chores and activities such as donning shoes    Baseline 50 deg    Time 6    Period Weeks    Status On-going      PT LONG TERM GOAL #4   Title Tolerate standing/ambulation for periods at least 20-30 min for activities such as grocery shopping with LBP/LLE pain 3/10 or less    Baseline still ongoing    Time 6    Period Weeks    Status On-going      PT LONG TERM GOAL #5   Title Increase left LE strength at least grossly 1/2 MMT grade to improve ability for stair navigation and lifting for chores    Baseline see objective    Time 6    Period Weeks    Status On-going                 Plan - 08/20/20 1723    Clinical Impression Statement Pt. presents for 6th therapy visit today. She continues with high pain level and limited tolerance for activity/exercises but showing mild improvement today with exercise tolerance and also with 13% improvement from baseline status at eval in FOTO score. Still pending MRI with clinical presentation of left LE pain worse with standing and walking concerning for stenosis. Plan continue PT as tolerated pending MRI for further activity progession. Added seated trunk flexion stretch today also with good tolerance.    Personal Factors and Comorbidities Comorbidity 3+  Comorbidities diabetic, myasthenia gravis, HTN    Examination-Activity Limitations  Stand;Lift;Squat;Sleep;Hygiene/Grooming;Dressing;Bathing;Sit;Transfers;Carry;Locomotion Level;Stairs;Bed Mobility    Examination-Participation Restrictions Cleaning;Laundry;Shop;Meal Prep;Driving;Community Activity    Stability/Clinical Decision Making Evolving/Moderate complexity    Clinical Decision Making Moderate    Rehab Potential Fair    PT Frequency --   2-3x/week   PT Duration 6 weeks    PT Treatment/Interventions ADLs/Self Care Home Management;Cryotherapy;Traction;Therapeutic exercise;Ultrasound;Electrical Stimulation;Moist Heat;Therapeutic activities;Functional mobility training;Neuromuscular re-education;Patient/family education;Manual techniques;Dry needling;Taping    PT Next Visit Plan continue performance/progression of flexion bias trunk ROM, stretches, gentle core, hip LAD/manual and modalities prn for pain    PT Home Exercise Plan Access code: [email or web address]    Consulted and Agree with Plan of Care Patient           Patient will benefit from skilled therapeutic intervention in order to improve the following deficits and impairments:  Pain, Postural dysfunction, Impaired flexibility, Decreased strength, Decreased activity tolerance, Decreased range of motion, Difficulty walking, Hypomobility, Increased muscle spasms  Visit Diagnosis: Radiculopathy, lumbar region  Muscle weakness (generalized)  Difficulty in walking, not elsewhere classified     Problem List Patient Active Problem List   Diagnosis Date Noted  . Bradycardia 08/20/2020  . Acute cervical radiculopathy 07/09/2020  . Pruritus 02/06/2020  . Raynaud's phenomenon 01/06/2020  . Leg cramping 07/25/2018  . Left wrist tendinitis 04/30/2018  . Left-sided low back pain with sciatica 04/30/2018  . Hand numbness 01/24/2018  . Lumbar radiculopathy 12/23/2016  . Plantar fasciitis of right foot 06/22/2016  . Leg weakness, bilateral 06/22/2016  . Cough 12/19/2014  . Carpal tunnel syndrome of right wrist 03/07/2013  .  Myasthenia gravis without exacerbation (HCC) 12/31/2010  . ALOPECIA 12/31/2010  . Hypothyroidism 12/29/2010  . Type 2 diabetes, diet controlled (HCC) 12/29/2010  . Hyperlipidemia 12/29/2010  . Essential hypertension 12/29/2010  . Coronary atherosclerosis 12/29/2010  . OSTEOARTHRITIS 12/29/2010    Lazarus Gowda, PT, DPT 08/20/20 5:40 PM  [facility] Outpatient Rehabilitation [facility] [address] [place], [place], [address] Phone: [phone]   Fax:  [phone]  Name: Gloria [name] MRN: [id number] Date of Birth: 1951-02-10

## 2020-08-20 NOTE — Assessment & Plan Note (Signed)
Chronic  Clinically euthyroid Check tsh  Titrate med dose if needed  

## 2020-08-20 NOTE — Assessment & Plan Note (Signed)
Acute HR has been a little low in the past, but not this low ? Related to gabapentin, doans pain medication On amlodipine - but has been on that for a while.

## 2020-08-20 NOTE — Assessment & Plan Note (Addendum)
Chronic Seeing ortho - will have an MRI soon Taking gabapentin, doans otc pain meds - will stop them since she is taking meloxicam

## 2020-08-24 ENCOUNTER — Encounter: Payer: Self-pay | Admitting: Physical Therapy

## 2020-08-24 ENCOUNTER — Other Ambulatory Visit: Payer: Self-pay

## 2020-08-24 ENCOUNTER — Ambulatory Visit: Payer: Medicare HMO | Admitting: Physical Therapy

## 2020-08-24 DIAGNOSIS — M5416 Radiculopathy, lumbar region: Secondary | ICD-10-CM

## 2020-08-24 DIAGNOSIS — M6281 Muscle weakness (generalized): Secondary | ICD-10-CM

## 2020-08-24 DIAGNOSIS — R262 Difficulty in walking, not elsewhere classified: Secondary | ICD-10-CM | POA: Diagnosis not present

## 2020-08-24 NOTE — Therapy (Signed)
[facility] Outpatient Rehabilitation [facility] [address] [place], [place], [address] Phone: [phone]   Fax:  [phone]  Physical Therapy Treatment  Patient Details  Name: Gloria [name] MRN: [id number] Date of Birth: Aug 18, 1951 Referring Provider (PT): Cheryll Cockayne, MD   Encounter Date: 08/24/2020   PT End of Session - 08/24/20 1716    Visit Number 7    Number of Visits 12    Date for PT Re-Evaluation 09/03/20    Authorization Type [employer] MCR    Authorization Time Period 07/23/20-08/28/20    Authorization - Visit Number 7    Authorization - Number of Visits 12    PT Start Time 1650    PT Stop Time 1745   10 minutes post therapy   PT Time Calculation (min) 55 min    Activity Tolerance Patient limited by pain    Behavior During Therapy [facility] for tasks assessed/performed           Past Medical History:  Diagnosis Date  . Alopecia areata 10/2010 onset  . Diabetes mellitus, type 2 (HCC)   . Dyslipidemia   . Hypertension   . Hypothyroid   . Myasthenia gravis     Past Surgical History:  Procedure Laterality Date  . ABDOMINAL HYSTERECTOMY  1990    There were no vitals filed for this visit.   Subjective Assessment - 08/24/20 1655    Subjective Patient reports continued improvement in symptoms, she may have over-did it this morning with her stretches.    Patient Stated Goals Get rid of back/leg pain    Currently in Pain? Yes    Pain Score 6     Pain Location Back    Pain Orientation Left;Lower    Pain Descriptors / Indicators Sharp    Pain Type Acute pain    Pain Radiating Towards left lateral leg to shin/calf region; left knee    Pain Onset More than a month ago    Pain Frequency Constant                             [facility] Adult PT Treatment/Exercise - 08/24/20 0001      Exercises   Exercises Lumbar      Lumbar Exercises: Stretches   Passive Hamstring Stretch 4 reps;10 seconds    Single Knee to Chest Stretch 4  reps;10 seconds    Lower Trunk Rotation 4 reps;10 seconds    Pelvic Tilt 5 reps   2 sets   Pelvic Tilt Limitations 3 sec hold    Other Lumbar Stretch Exercise Seated lumbar flexion with physioball x 10 fwd, x 5 lateral each      Lumbar Exercises: Aerobic   Nustep L3 x 5 min with LE only       Lumbar Exercises: Supine   Bent Knee Raise 5 reps   2 sets   Bent Knee Raise Limitations alternating marching, cued for core engagement      Lumbar Exercises: Sidelying   Clam 5 reps   2 sets     Cryotherapy   Number Minutes Cryotherapy 10 Minutes    Cryotherapy Location Lumbar Spine   left lower back, hip, lateral leg down to calf   Type of Cryotherapy Ice pack      Manual Therapy   Manual Therapy Joint mobilization;Soft tissue mobilization    Joint Mobilization Left hip LAD with gentle oscillations for multiple bouts    Soft tissue mobilization FR  to lateral gluteal region/hip, lateral thigh and                  PT Education - 08/24/20 1715    Education Details HEP    Person(s) Educated Patient    Methods Explanation    Comprehension Verbalized understanding;Need further instruction            PT Short Term Goals - 08/13/20 1735      PT SHORT TERM GOAL #1   Title Independent with initial HEP    Baseline Patient inconsistent with hep due to pain level    Time 3    Period Weeks    Status On-going    Target Date 08/13/20      PT SHORT TERM GOAL #2   Title Return to sleeping in bed/no further need to sleep in recliner    Baseline Patient continues to sleep in recliner, states better sleep last night    Time 3    Period Weeks    Status On-going    Target Date 08/13/20             PT Long Term Goals - 08/20/20 1736      PT LONG TERM GOAL #1   Title Independent with advanced HEP for continued progress after d/c from therapy    Baseline ongoing for updates    Time 6    Period Weeks    Status On-going      PT LONG TERM GOAL #2   Title Improve FOTO outcome  measure score to 44% or less impairment    Baseline 57% limited 08/20/20    Time 6    Period Weeks    Status On-going      PT LONG TERM GOAL #3   Title Increase trunk flexion AROM at least 20 deg to improve ability for bending for chores and activities such as donning shoes    Baseline 50 deg    Time 6    Period Weeks    Status On-going      PT LONG TERM GOAL #4   Title Tolerate standing/ambulation for periods at least 20-30 min for activities such as grocery shopping with LBP/LLE pain 3/10 or less    Baseline still ongoing    Time 6    Period Weeks    Status On-going      PT LONG TERM GOAL #5   Title Increase left LE strength at least grossly 1/2 MMT grade to improve ability for stair navigation and lifting for chores    Baseline see objective    Time 6    Period Weeks    Status On-going                 Plan - 08/24/20 1718    Clinical Impression Statement Patient tolerated therapy well without adverse effects, she does continue to report increase pain level with all activity and has limited tolerance for exercise. Continued with light mobility and activation exercises with use of manual therapy for pain relief. She does report improvement in symptoms following therapy. Patient has not scheduled her MRI but does have a follow-up with with orthopedic surgeon on 08/26/2020. Patient would benefit from continued skilled PT to reduce pain and progress mobility to return to prior level of function.    PT Treatment/Interventions ADLs/Self Care Home Management;Cryotherapy;Traction;Therapeutic exercise;Ultrasound;Electrical Stimulation;Moist Heat;Therapeutic activities;Functional mobility training;Neuromuscular re-education;Patient/family education;Manual techniques;Dry needling;Taping    PT Next Visit Plan continue performance/progression of flexion bias trunk ROM, stretches,  gentle core, hip LAD/manual and modalities prn for pain    PT Home Exercise Plan Access code: [email or web address]     Consulted and Agree with Plan of Care Patient           Patient will benefit from skilled therapeutic intervention in order to improve the following deficits and impairments:  Pain, Postural dysfunction, Impaired flexibility, Decreased strength, Decreased activity tolerance, Decreased range of motion, Difficulty walking, Hypomobility, Increased muscle spasms  Visit Diagnosis: Radiculopathy, lumbar region  Muscle weakness (generalized)  Difficulty in walking, not elsewhere classified     Problem List Patient Active Problem List   Diagnosis Date Noted  . Bradycardia 08/20/2020  . Acute cervical radiculopathy 07/09/2020  . Pruritus 02/06/2020  . Raynaud's phenomenon 01/06/2020  . Leg cramping 07/25/2018  . Left wrist tendinitis 04/30/2018  . Left-sided low back pain with sciatica 04/30/2018  . Hand numbness 01/24/2018  . Lumbar radiculopathy 12/23/2016  . Plantar fasciitis of right foot 06/22/2016  . Leg weakness, bilateral 06/22/2016  . Cough 12/19/2014  . Carpal tunnel syndrome of right wrist 03/07/2013  . Myasthenia gravis without exacerbation (HCC) 12/31/2010  . ALOPECIA 12/31/2010  . Hypothyroidism 12/29/2010  . Type 2 diabetes, diet controlled (HCC) 12/29/2010  . Hyperlipidemia 12/29/2010  . Essential hypertension 12/29/2010  . Coronary atherosclerosis 12/29/2010  . OSTEOARTHRITIS 12/29/2010    Rosana Hoes, PT, DPT, LAT, ATC 08/24/20  5:37 PM Phone: [phone] Fax: [phone]   [facility] Outpatient Rehabilitation [facility] [address] [place], [place], [address] Phone: [phone]   Fax:  [phone]  Name: Gloria [name] MRN: [id number] Date of Birth: 12/02/1951

## 2020-08-26 ENCOUNTER — Ambulatory Visit: Payer: Medicare HMO | Admitting: Orthopaedic Surgery

## 2020-08-26 ENCOUNTER — Ambulatory Visit: Payer: Medicare HMO | Admitting: Physical Therapy

## 2020-08-26 ENCOUNTER — Encounter: Payer: Self-pay | Admitting: Physical Therapy

## 2020-08-26 ENCOUNTER — Other Ambulatory Visit: Payer: Self-pay

## 2020-08-26 DIAGNOSIS — M6281 Muscle weakness (generalized): Secondary | ICD-10-CM | POA: Diagnosis not present

## 2020-08-26 DIAGNOSIS — M5416 Radiculopathy, lumbar region: Secondary | ICD-10-CM

## 2020-08-26 DIAGNOSIS — R262 Difficulty in walking, not elsewhere classified: Secondary | ICD-10-CM

## 2020-08-26 NOTE — Therapy (Signed)
[facility] Outpatient Rehabilitation [facility] [address] [place], [place], [address] Phone: [phone]   Fax:  [phone]  Physical Therapy Treatment  Patient Details  Name: Gloria [name] MRN: [id number] Date of Birth: [birth year] Referring Provider (PT): Cheryll Cockayne, MD   Encounter Date: 08/26/2020   PT End of Session - 08/26/20 1630    Visit Number 8    Number of Visits 12    Date for PT Re-Evaluation 09/03/20    Authorization Type [employer] MCR    Authorization Time Period 07/23/20-08/28/20    Authorization - Visit Number 8    Authorization - Number of Visits 12    PT Start Time 1615    PT Stop Time 1705    PT Time Calculation (min) 50 min    Activity Tolerance Patient limited by pain    Behavior During Therapy [facility] for tasks assessed/performed           Past Medical History:  Diagnosis Date  . Alopecia areata 10/2010 onset  . Diabetes mellitus, type 2 (HCC)   . Dyslipidemia   . Hypertension   . Hypothyroid   . Myasthenia gravis     Past Surgical History:  Procedure Laterality Date  . ABDOMINAL HYSTERECTOMY  1990    There were no vitals filed for this visit.   Subjective Assessment - 08/26/20 1620    Subjective Patient reports she is doing well. No new issues.    Currently in Pain? Yes    Pain Score 6     Pain Location Back    Pain Orientation Left;Lower    Pain Descriptors / Indicators Sharp    Pain Radiating Towards left lateral leg to shin/calf region; left knee    Pain Onset More than a month ago    Pain Frequency Constant                             [facility] Adult PT Treatment/Exercise - 08/26/20 0001      Exercises   Exercises Lumbar      Lumbar Exercises: Stretches   Passive Hamstring Stretch 3 reps;20 seconds    Passive Hamstring Stretch Limitations limited range, slight bend in knee    Single Knee to Chest Stretch 3 reps;20 seconds    Single Knee to Chest Stretch Limitations limited range    Double  Knee to Chest Stretch Limitations Feet elevated on physioball x 15 reps    Lower Trunk Rotation 4 reps;10 seconds    Lower Trunk Rotation Limitations legs elevated on physioball    Pelvic Tilt 10 reps    Pelvic Tilt Limitations 3 sec hold    Other Lumbar Stretch Exercise Seated lumbar flexion with physioball x 10 fwd, x 5 lateral each      Lumbar Exercises: Aerobic   Nustep L3 x 5 min with LE only       Lumbar Exercises: Supine   Bent Knee Raise 10 reps    Bent Knee Raise Limitations alternating marching      Lumbar Exercises: Sidelying   Clam 10 reps      Cryotherapy   Number Minutes Cryotherapy 10 Minutes    Cryotherapy Location Lumbar Spine   left lower back, hip, lateral leg down to calf   Type of Cryotherapy Ice pack      Manual Therapy   Manual Therapy Joint mobilization;Soft tissue mobilization    Joint Mobilization Left hip LAD with gentle oscillations for multiple  bouts    Soft tissue mobilization FR to lateral gluteal region/hip, lateral thigh and                  PT Education - 08/26/20 1629    Education Details HEP    Person(s) Educated Patient    Methods Explanation    Comprehension Verbalized understanding;Need further instruction            PT Short Term Goals - 08/13/20 1735      PT SHORT TERM GOAL #1   Title Independent with initial HEP    Baseline Patient inconsistent with hep due to pain level    Time 3    Period Weeks    Status On-going    Target Date 08/13/20      PT SHORT TERM GOAL #2   Title Return to sleeping in bed/no further need to sleep in recliner    Baseline Patient continues to sleep in recliner, states better sleep last night    Time 3    Period Weeks    Status On-going    Target Date 08/13/20             PT Long Term Goals - 08/20/20 1736      PT LONG TERM GOAL #1   Title Independent with advanced HEP for continued progress after d/c from therapy    Baseline ongoing for updates    Time 6    Period Weeks     Status On-going      PT LONG TERM GOAL #2   Title Improve FOTO outcome measure score to 44% or less impairment    Baseline 57% limited 08/20/20    Time 6    Period Weeks    Status On-going      PT LONG TERM GOAL #3   Title Increase trunk flexion AROM at least 20 deg to improve ability for bending for chores and activities such as donning shoes    Baseline 50 deg    Time 6    Period Weeks    Status On-going      PT LONG TERM GOAL #4   Title Tolerate standing/ambulation for periods at least 20-30 min for activities such as grocery shopping with LBP/LLE pain 3/10 or less    Baseline still ongoing    Time 6    Period Weeks    Status On-going      PT LONG TERM GOAL #5   Title Increase left LE strength at least grossly 1/2 MMT grade to improve ability for stair navigation and lifting for chores    Baseline see objective    Time 6    Period Weeks    Status On-going                 Plan - 08/26/20 1633    Clinical Impression Statement Patient tolerated therapy well without adverse effects, she is tolerating greater levels of exercise in therapy and reporting improvement overall in symptoms. Most of therapy focused on progressing motion and gentle core activation. Cotninued with flexion focused treatment. She was able to schedule an MRI for 09/15/2020 but did not see her doctor today.Patient would benefit from continued skilled PT to reduce pain and progress mobility to return to prior level of function.    PT Treatment/Interventions ADLs/Self Care Home Management;Cryotherapy;Traction;Therapeutic exercise;Ultrasound;Electrical Stimulation;Moist Heat;Therapeutic activities;Functional mobility training;Neuromuscular re-education;Patient/family education;Manual techniques;Dry needling;Taping    PT Next Visit Plan Reassessment, continue performance/progression of flexion bias trunk ROM, stretches, gentle core,  hip LAD/manual and modalities prn for pain    PT Home Exercise Plan [email or web address]     Consulted and Agree with Plan of Care Patient           Patient will benefit from skilled therapeutic intervention in order to improve the following deficits and impairments:  Pain, Postural dysfunction, Impaired flexibility, Decreased strength, Decreased activity tolerance, Decreased range of motion, Difficulty walking, Hypomobility, Increased muscle spasms  Visit Diagnosis: Radiculopathy, lumbar region  Muscle weakness (generalized)  Difficulty in walking, not elsewhere classified     Problem List Patient Active Problem List   Diagnosis Date Noted  . Bradycardia 08/20/2020  . Acute cervical radiculopathy 07/09/2020  . Pruritus 02/06/2020  . Raynaud's phenomenon 01/06/2020  . Leg cramping 07/25/2018  . Left wrist tendinitis 04/30/2018  . Left-sided low back pain with sciatica 04/30/2018  . Hand numbness 01/24/2018  . Lumbar radiculopathy 12/23/2016  . Plantar fasciitis of right foot 06/22/2016  . Leg weakness, bilateral 06/22/2016  . Cough 12/19/2014  . Carpal tunnel syndrome of right wrist 03/07/2013  . Myasthenia gravis without exacerbation (HCC) 12/31/2010  . ALOPECIA 12/31/2010  . Hypothyroidism 12/29/2010  . Type 2 diabetes, diet controlled (HCC) 12/29/2010  . Hyperlipidemia 12/29/2010  . Essential hypertension 12/29/2010  . Coronary atherosclerosis 12/29/2010  . OSTEOARTHRITIS 12/29/2010    Rosana Hoes, PT, DPT, LAT, ATC 08/26/20  5:00 PM Phone: [phone] Fax: [phone]   [facility] Out[facility] [address] [place], [place], [address] Phone: [phone]   Fax:  [phone]  Name: Gloria [name] MRN: [id number] Date of Birth: Nov 14, 1951

## 2020-08-28 ENCOUNTER — Other Ambulatory Visit: Payer: Self-pay

## 2020-08-28 ENCOUNTER — Ambulatory Visit: Payer: Medicare HMO | Admitting: Physical Therapy

## 2020-08-28 ENCOUNTER — Encounter: Payer: Self-pay | Admitting: Physical Therapy

## 2020-08-28 DIAGNOSIS — M6281 Muscle weakness (generalized): Secondary | ICD-10-CM | POA: Diagnosis not present

## 2020-08-28 DIAGNOSIS — M5416 Radiculopathy, lumbar region: Secondary | ICD-10-CM

## 2020-08-28 DIAGNOSIS — R262 Difficulty in walking, not elsewhere classified: Secondary | ICD-10-CM | POA: Diagnosis not present

## 2020-08-28 NOTE — Therapy (Signed)
[facility] [address] [place], [place], [address] Phone: [phone]   Fax:  [phone]  Physical Therapy Treatment  Patient Details  Name: Gloria [name] MRN: [id number] Date of Birth: 1951-02-02 Referring Provider (PT): Cheryll Cockayne, MD   Encounter Date: 08/28/2020   PT End of Session - 08/28/20 1352    Visit Number 9    Number of Visits 12    Date for PT Re-Evaluation 09/03/20    Authorization Type [employer] MCR    Authorization Time Period 07/23/20-08/28/20    Authorization - Visit Number 9    Authorization - Number of Visits 12    PT Start Time 1345    PT Stop Time 1430    PT Time Calculation (min) 45 min    Activity Tolerance Patient limited by pain    Behavior During Therapy [facility] for tasks assessed/performed           Past Medical History:  Diagnosis Date  . Alopecia areata 10/2010 onset  . Diabetes mellitus, type 2 (HCC)   . Dyslipidemia   . Hypertension   . Hypothyroid   . Myasthenia gravis     Past Surgical History:  Procedure Laterality Date  . ABDOMINAL HYSTERECTOMY  1990    There were no vitals filed for this visit.   Subjective Assessment - 08/28/20 1350    Subjective Patient reports she continues to improve.    Patient Stated Goals Get rid of back/leg pain    Currently in Pain? Yes    Pain Score 5     Pain Location Back    Pain Orientation Lower;Left    Pain Descriptors / Indicators Sharp    Pain Type Acute pain    Pain Onset More than a month ago    Pain Frequency Constant              [facility] PT Assessment - 08/28/20 0001      Assessment   Medical Diagnosis Lumbar radiculopathy    Referring Provider (PT) Cheryll Cockayne, MD    Onset Date/Surgical Date 06/25/20      Prior Function   Level of Independence Independent with basic ADLs      Observation/Other Assessments   Focus on Therapeutic Outcomes (FOTO)  64% limitation      AROM   Lumbar Flexion 75%    Lumbar Extension 50%     Lumbar - Right Side Bend 50%    Lumbar - Left Side Bend 50%    Lumbar - Right Rotation 50%    Lumbar - Left Rotation 50%      Strength   Right Hip Flexion 4/5    Right Hip Extension 3+/5    Right Hip ABduction 3/5    Left Hip Flexion 4-/5    Left Hip Extension 3/5    Left Hip ABduction 3-/5    Right Knee Flexion 5/5    Right Knee Extension 5/5    Left Knee Flexion 4+/5    Left Knee Extension 4+/5                         [facility] Adult PT Treatment/Exercise - 08/28/20 0001      Exercises   Exercises Lumbar      Lumbar Exercises: Stretches   Passive Hamstring Stretch 2 reps;20 seconds    Passive Hamstring Stretch Limitations limited range, slight bend in knee    Single Knee to Chest Stretch 2 reps;20 seconds  Single Knee to Chest Stretch Limitations limited range    Lower Trunk Rotation 4 reps;10 seconds    Pelvic Tilt 10 reps    Pelvic Tilt Limitations 3 sec hold    Other Lumbar Stretch Exercise Seated lumbar flexion with physioball x 10 fwd, x 5 lateral each with 5 sec hold      Lumbar Exercises: Supine   Bent Knee Raise 10 reps    Bent Knee Raise Limitations alternating marching      Lumbar Exercises: Sidelying   Clam 10 reps      Cryotherapy   Number Minutes Cryotherapy 10 Minutes    Cryotherapy Location Lumbar Spine   left lower back, hip, lateral leg down to calf   Type of Cryotherapy Ice pack      Manual Therapy   Manual Therapy Joint mobilization;Soft tissue mobilization    Joint Mobilization Left hip LAD with gentle oscillations for multiple bouts    Soft tissue mobilization FR to lateral gluteal region/hip, lateral thigh and                  PT Education - 08/28/20 1350    Education Details HEP    Person(s) Educated Patient    Methods Explanation    Comprehension Verbalized understanding;Need further instruction            PT Short Term Goals - 08/28/20 1405      PT SHORT TERM GOAL #1   Title Independent with initial  HEP    Baseline Patient inconsistent with hep due to pain level    Time 3    Period Weeks    Status Achieved    Target Date 08/13/20      PT SHORT TERM GOAL #2   Title Return to sleeping in bed/no further need to sleep in recliner    Baseline Patient continues to sleep in recliner    Time 3    Period Weeks    Status On-going    Target Date 08/13/20             PT Long Term Goals - 08/28/20 1406      PT LONG TERM GOAL #1   Title Independent with advanced HEP for continued progress after d/c from therapy    Baseline ongoing for updates    Time 6    Period Weeks    Status On-going    Target Date 09/03/20      PT LONG TERM GOAL #2   Title Improve FOTO outcome measure score to 44% or less impairment    Baseline Reported worsening in FOTO score from 57% to 64% limitation    Time 6    Period Weeks    Status On-going    Target Date 09/03/20      PT LONG TERM GOAL #3   Title Increase trunk flexion AROM at least 20 deg to improve ability for bending for chores and activities such as donning shoes    Baseline Exhibits improve forward bend but continues to be limited    Time 6    Period Weeks    Status On-going    Target Date 09/03/20      PT LONG TERM GOAL #4   Title Tolerate standing/ambulation for periods at least 20-30 min for activities such as grocery shopping with LBP/LLE pain 3/10 or less    Baseline still ongoing    Time 6    Period Weeks    Status On-going    Target  Date 09/03/20      PT LONG TERM GOAL #5   Title Increase left LE strength at least grossly 1/2 MMT grade to improve ability for stair navigation and lifting for chores    Baseline Patient exhibits improve LE strength, see flowsheet    Time 6    Period Weeks    Status On-going    Target Date 09/03/20                 Plan - 08/28/20 1354    Clinical Impression Statement Patient tolerated therapy well without adverse effects, reduce volume of exercises this visit because patient did  report she had trouble moving day after last therapy visit. Overall patient continues to subjectively report improvement in symptoms and she does exhibit improvement in motion and strength. She did report worsening of functional level on FOTO compared to last assessment but patient reports improvement since evaluation. She is gradually making progress towards goals and would benefit from continued skilled PT to reduce pain and progress mobility to return to prior level of function.    PT Treatment/Interventions ADLs/Self Care Home Management;Cryotherapy;Traction;Therapeutic exercise;Ultrasound;Electrical Stimulation;Moist Heat;Therapeutic activities;Functional mobility training;Neuromuscular re-education;Patient/family education;Manual techniques;Dry needling;Taping    PT Next Visit Plan Continue performance/progression of flexion bias trunk ROM, stretches, gentle core, hip LAD/manual and modalities prn for pain    PT Home Exercise Plan [id number]    Consulted and Agree with Plan of Care Patient           Patient will benefit from skilled therapeutic intervention in order to improve the following deficits and impairments:  Pain, Postural dysfunction, Impaired flexibility, Decreased strength, Decreased activity tolerance, Decreased range of motion, Difficulty walking, Hypomobility, Increased muscle spasms   What was this (referring dx) caused by? []  Surgery []  Fall [x]  Ongoing issue []  Arthritis []  Other: ____________  Laterality: []  Rt [x]  Lt []  Both  Check all possible CPT codes:      []  97110 (Therapeutic Exercise)  []  92507 (SLP Treatment)  []  (Neuro Re-ed)   []  92526 (Swallowing Treatment)   []  97116 (Gait Training)   []  (Cognitive Training, 1st 15 minutes) []  (Manual Therapy)   []  97130 (Cognitive Training, each add'l 15 minutes)  []  97530 (Therapeutic Activities)  []  Other, List CPT Code ____________    []  97535 (Self Care)       [x]  All codes above (97110 -  97535)  []  97012 (Mechanical Traction)  [x]  97014 (E-stim Unattended)  []  97032 (E-stim manual)  []  97033 (Ionto)  []  97035 (Ultrasound)  []  97760 (Orthotic Fit) []  (Physical Performance Training) []  (Aquatic Therapy) []  97034 (Contrast Bath) []  (Paraffin) []  97597 (Wound Care 1st 20 sq cm) []  97598 (Wound Care each add'l 20 sq cm)   Visit Diagnosis: Radiculopathy, lumbar region  Muscle weakness (generalized)  Difficulty in walking, not elsewhere classified     Problem List Patient Active Problem List   Diagnosis Date Noted  . Bradycardia 08/20/2020  . Acute cervical radiculopathy 07/09/2020  . Pruritus 02/06/2020  . Raynaud's phenomenon 01/06/2020  . Leg cramping 07/25/2018  . Left wrist tendinitis 04/30/2018  . Left-sided low back pain with sciatica 04/30/2018  . Hand numbness 01/24/2018  . Lumbar radiculopathy 12/23/2016  . Plantar fasciitis of right foot 06/22/2016  . Leg weakness, bilateral 06/22/2016  . Cough 12/19/2014  . Carpal tunnel syndrome of right wrist 03/07/2013  . Myasthenia gravis without exacerbation (HCC) 12/31/2010  . ALOPECIA 12/31/2010  . Hypothyroidism  12/29/2010  . Type 2 diabetes, diet controlled (HCC) 12/29/2010  . Hyperlipidemia 12/29/2010  . Essential hypertension 12/29/2010  . Coronary atherosclerosis 12/29/2010  . OSTEOARTHRITIS 12/29/2010    Rosana Hoesampbell Sebert Stollings, PT, DPT, LAT, ATC 08/28/20  2:26 PM Phone: [phone] Fax: [phone]   [facility] [address] [place], [place], [address] Phone: [phone]   Fax:  [phone]  Name: Gloria [name] MRN: [id number] Date of Birth: 1951/07/11

## 2020-09-03 ENCOUNTER — Encounter: Payer: Self-pay | Admitting: Physical Therapy

## 2020-09-03 ENCOUNTER — Ambulatory Visit: Payer: Medicare HMO | Admitting: Physical Therapy

## 2020-09-03 ENCOUNTER — Other Ambulatory Visit: Payer: Self-pay

## 2020-09-03 DIAGNOSIS — M6281 Muscle weakness (generalized): Secondary | ICD-10-CM | POA: Diagnosis not present

## 2020-09-03 DIAGNOSIS — M5416 Radiculopathy, lumbar region: Secondary | ICD-10-CM | POA: Diagnosis not present

## 2020-09-03 DIAGNOSIS — R262 Difficulty in walking, not elsewhere classified: Secondary | ICD-10-CM | POA: Diagnosis not present

## 2020-09-03 NOTE — Therapy (Signed)
[facility] [address] [place], [place], [address] Phone: [phone]   Fax:  [phone]  Physical Therapy Treatment / ERO  Progress Note Reporting Period 07/23/2020 to 09/03/2020  See note below for Objective Data and Assessment of Progress/Goals.     Patient Details  Name: Gloria [name] MRN: [id number] Date of Birth: 1951-06-02 Referring Provider (PT): Cheryll Cockayne, MD   Encounter Date: 09/03/2020   PT End of Session - 09/03/20 1538    Visit Number 10    Number of Visits 22    Date for PT Re-Evaluation 10/15/20    Authorization Type [employer] MCR    Progress Note Due on Visit 20    PT Start Time 1530    PT Stop Time 1615    PT Time Calculation (min) 45 min    Activity Tolerance Patient limited by pain    Behavior During Therapy [facility] for tasks assessed/performed           Past Medical History:  Diagnosis Date  . Alopecia areata 10/2010 onset  . Diabetes mellitus, type 2 (HCC)   . Dyslipidemia   . Hypertension   . Hypothyroid   . Myasthenia gravis     Past Surgical History:  Procedure Laterality Date  . ABDOMINAL HYSTERECTOMY  1990    There were no vitals filed for this visit.   Subjective Assessment - 09/03/20 1534    Subjective Patient states she continues to improve and she notes her walking has gotten better. She was able to go into the store and use a cart to walk around. Patient also reports she was able to sleep in her bed.    Patient Stated Goals Get rid of back/leg pain    Currently in Pain? Yes    Pain Score 5     Pain Location Back    Pain Orientation Lower;Left    Pain Descriptors / Indicators Sharp    Pain Type Acute pain    Pain Radiating Towards left lateral leg to shin/calf region; left knee    Pain Onset More than a month ago    Pain Frequency Constant              [facility] PT Assessment - 09/03/20 0001      Assessment   Medical Diagnosis Lumbar radiculopathy    Referring  Provider (PT) Cheryll Cockayne, MD    Onset Date/Surgical Date 06/25/20      Precautions   Precautions None      Restrictions   Weight Bearing Restrictions No      Balance Screen   Has the patient fallen in the past 6 months No      Prior Function   Level of Independence Independent with basic ADLs      Cognition   Overall Cognitive Status Within Functional Limits for tasks assessed      Observation/Other Assessments   Focus on Therapeutic Outcomes (FOTO)  64% limitation      Sensation   Light Touch Appears Intact      Coordination   Gross Motor Movements are Fluid and Coordinated Yes      AROM   Lumbar Flexion 75%    Lumbar Extension 50%    Lumbar - Right Side Bend 50%    Lumbar - Left Side Bend 50%    Lumbar - Right Rotation 50%    Lumbar - Left Rotation 50%      Strength   Right Hip Flexion 4/5  Right Hip Extension 3+/5    Right Hip ABduction 3/5    Left Hip Flexion 4-/5    Left Hip Extension 3/5    Left Hip ABduction 3-/5    Right Knee Flexion 5/5    Right Knee Extension 5/5    Left Knee Flexion 4+/5    Left Knee Extension 4+/5                         [facility] Adult PT Treatment/Exercise - 09/03/20 0001      Lumbar Exercises: Stretches   Passive Hamstring Stretch 2 reps;20 seconds    Passive Hamstring Stretch Limitations PROM, limited range, slight bend in knee    Single Knee to Chest Stretch 2 reps;20 seconds    Single Knee to Chest Stretch Limitations PROM, limited range    Lower Trunk Rotation 4 reps;10 seconds    Pelvic Tilt 10 reps   patient needed multiple rest breaks   Pelvic Tilt Limitations 3 sec hold    Other Lumbar Stretch Exercise Seated lumbar flexion with physioball x 10 fwd, x 5 lateral each with 5 sec hold      Lumbar Exercises: Supine   Bent Knee Raise 10 reps    Bent Knee Raise Limitations alternating marching      Lumbar Exercises: Sidelying   Clam 10 reps      Modalities   Modalities Cryotherapy      Cryotherapy    Number Minutes Cryotherapy 7 Minutes    Cryotherapy Location Lumbar Spine   left lower back, hip, lateral leg down to calf   Type of Cryotherapy Ice pack      Manual Therapy   Manual Therapy Joint mobilization;Soft tissue mobilization    Joint Mobilization Left hip LAD with gentle oscillations for multiple bouts    Soft tissue mobilization FR to lateral gluteal region/hip, lateral thigh and                  PT Education - 09/03/20 1537    Education Details POC, HEP    Person(s) Educated Patient    Methods Explanation    Comprehension Verbalized understanding;Need further instruction            PT Short Term Goals - 09/03/20 1552      PT SHORT TERM GOAL #1   Title Independent with initial HEP    Baseline Patient inconsistent with hep due to pain level    Time 3    Period Weeks    Status Achieved    Target Date 08/13/20      PT SHORT TERM GOAL #2   Title Return to sleeping in bed/no further need to sleep in recliner    Baseline Patient continues to sleep in recliner    Time 3    Period Weeks    Status Achieved    Target Date 08/13/20             PT Long Term Goals - 09/03/20 1553      PT LONG TERM GOAL #1   Title Independent with advanced HEP for continued progress after d/c from therapy    Baseline ongoing for updates    Time 6    Period Weeks    Status On-going    Target Date 10/15/20      PT LONG TERM GOAL #2   Title Improve FOTO outcome measure score to 44% or less impairment    Baseline Reported worsening in FOTO  score from 57% to 64% limitation    Time 6    Period Weeks    Status On-going    Target Date 10/15/20      PT LONG TERM GOAL #3   Title Increase trunk flexion AROM at least 20 deg to improve ability for bending for chores and activities such as donning shoes    Baseline Exhibits improve forward bend but continues to be limited    Time 6    Period Weeks    Status On-going    Target Date 10/15/20      PT LONG TERM GOAL #4    Title Tolerate standing/ambulation for periods at least 20-30 min for activities such as grocery shopping with LBP/LLE pain 3/10 or less    Baseline still ongoing    Time 6    Period Weeks    Status On-going    Target Date 10/15/20      PT LONG TERM GOAL #5   Title Increase left LE strength at least grossly 1/2 MMT grade to improve ability for stair navigation and lifting for chores    Baseline Patient exhibits improve LE strength, see flowsheet    Time 6    Period Weeks    Status On-going    Target Date 10/15/20                 Plan - 09/03/20 1554    Clinical Impression Statement Patient tolerated therapy well without adverse effects. Patient continues to report increased left lower back pain with movement and has decresed tolerance for therapy. She does note improvement in her symptoms with LAD and soft tissue mobilization with FR. She does continue to state improvement in overall functional level and notes this visit improvement in walking ability. She is gradually progressing in motion and strength. She is gradually making progress towards goals and would benefit from continued skilled PT to reduce pain and progress mobility to return to prior level of function.    PT Frequency 2x / week    PT Duration 6 weeks    PT Treatment/Interventions ADLs/Self Care Home Management;Cryotherapy;Traction;Therapeutic exercise;Ultrasound;Electrical Stimulation;Moist Heat;Therapeutic activities;Functional mobility training;Neuromuscular re-education;Patient/family education;Manual techniques;Dry needling;Taping    PT Next Visit Plan Continue performance/progression of flexion bias trunk ROM, stretches, gentle core, hip LAD/manual and modalities prn for pain    PT Home Exercise Plan [id number]    Consulted and Agree with Plan of Care Patient           Patient will benefit from skilled therapeutic intervention in order to improve the following deficits and impairments:  Pain, Postural  dysfunction, Impaired flexibility, Decreased strength, Decreased activity tolerance, Decreased range of motion, Difficulty walking, Hypomobility, Increased muscle spasms  Visit Diagnosis: Radiculopathy, lumbar region  Muscle weakness (generalized)  Difficulty in walking, not elsewhere classified     Problem List Patient Active Problem List   Diagnosis Date Noted  . Bradycardia 08/20/2020  . Acute cervical radiculopathy 07/09/2020  . Pruritus 02/06/2020  . Raynaud's phenomenon 01/06/2020  . Leg cramping 07/25/2018  . Left wrist tendinitis 04/30/2018  . Left-sided low back pain with sciatica 04/30/2018  . Hand numbness 01/24/2018  . Lumbar radiculopathy 12/23/2016  . Plantar fasciitis of right foot 06/22/2016  . Leg weakness, bilateral 06/22/2016  . Cough 12/19/2014  . Carpal tunnel syndrome of right wrist 03/07/2013  . Myasthenia gravis without exacerbation (HCC) 12/31/2010  . ALOPECIA 12/31/2010  . Hypothyroidism 12/29/2010  . Type 2 diabetes, diet controlled (HCC) 12/29/2010  . Hyperlipidemia  12/29/2010  . Essential hypertension 12/29/2010  . Coronary atherosclerosis 12/29/2010  . OSTEOARTHRITIS 12/29/2010    Rosana Hoes, PT, DPT, LAT, ATC 09/03/20  4:13 PM Phone: [phone] Fax: [phone]   [facility] [address] [place], [place], [address] Phone: [phone]   Fax:  [phone]  Name: Gloria [name] MRN: [id number] Date of Birth: [birth year]

## 2020-09-08 ENCOUNTER — Ambulatory Visit: Payer: Medicare HMO | Admitting: Physical Therapy

## 2020-09-08 ENCOUNTER — Other Ambulatory Visit: Payer: Self-pay

## 2020-09-08 ENCOUNTER — Encounter: Payer: Self-pay | Admitting: Physical Therapy

## 2020-09-08 DIAGNOSIS — M6281 Muscle weakness (generalized): Secondary | ICD-10-CM | POA: Diagnosis not present

## 2020-09-08 DIAGNOSIS — M5416 Radiculopathy, lumbar region: Secondary | ICD-10-CM

## 2020-09-08 DIAGNOSIS — R262 Difficulty in walking, not elsewhere classified: Secondary | ICD-10-CM | POA: Diagnosis not present

## 2020-09-08 NOTE — Therapy (Signed)
[facility] [address] [place], [place], [address] Phone: [phone]   Fax:  [phone]  Physical Therapy Treatment  Patient Details  Name: Gloria [name] MRN: [id number] Date of Birth: [birth year] Referring Provider (PT): Cheryll Cockayne, MD   Encounter Date: 09/08/2020   PT End of Session - 09/08/20 1407    Visit Number 11    Number of Visits 22    Date for PT Re-Evaluation 10/15/20    Authorization Type MCR ?MCD    Progress Note Due on Visit 20    PT Start Time 1400    PT Stop Time 1450    PT Time Calculation (min) 50 min    Activity Tolerance Patient limited by pain;Patient tolerated treatment well    Behavior During Therapy [facility] for tasks assessed/performed           Past Medical History:  Diagnosis Date   Alopecia areata 10/2010 onset   Diabetes mellitus, type 2 (HCC)    Dyslipidemia    Hypertension    Hypothyroid    Myasthenia gravis     Past Surgical History:  Procedure Laterality Date   ABDOMINAL HYSTERECTOMY  1990    There were no vitals filed for this visit.   Subjective Assessment - 09/08/20 1403    Subjective Patient reports she is doing well today, feels she continues to improve. She notes that she hasn't had a spasm in a few days.    Patient Stated Goals Get rid of back/leg pain    Currently in Pain? Yes    Pain Score 5     Pain Location Back    Pain Orientation Left;Lower    Pain Descriptors / Indicators Sharp;Aching    Pain Type Acute pain    Pain Radiating Towards left lateral leg to shin/calf region    Pain Onset More than a month ago    Pain Frequency Constant                             [facility] Adult PT Treatment/Exercise - 09/08/20 0001      Lumbar Exercises: Stretches   Single Knee to Chest Stretch 3 reps;20 seconds    Single Knee to Chest Stretch Limitations PROM, limited range    Hip Flexor Stretch 3 reps;20 seconds    Hip Flexor Stretch  Limitations PROM edge of table    Other Lumbar Stretch Exercise Seated lumbar flexion with physioball x 10 fwd, x 5 lateral each with 5 sec hold      Lumbar Exercises: Supine   Pelvic Tilt 10 reps    Pelvic Tilt Limitations no hold, partial bridge    Clam 10 reps;3 seconds   2 sets   Bent Knee Raise 10 reps   2 sets   Bent Knee Raise Limitations alternating marching      Modalities   Modalities Cryotherapy      Cryotherapy   Number Minutes Cryotherapy 10 Minutes    Cryotherapy Location Lumbar Spine   left lower back, hip, lateral leg down to calf   Type of Cryotherapy Ice pack      Manual Therapy   Manual Therapy Joint mobilization;Soft tissue mobilization    Joint Mobilization Left hip LAD with gentle oscillations for multiple bouts    Soft tissue mobilization FR to lateral gluteal region/hip, lateral thigh and  PT Education - 09/08/20 1405    Education Details HEP    Person(s) Educated Patient    Methods Explanation    Comprehension Verbalized understanding;Need further instruction            PT Short Term Goals - 09/03/20 1552      PT SHORT TERM GOAL #1   Title Independent with initial HEP    Baseline Patient inconsistent with hep due to pain level    Time 3    Period Weeks    Status Achieved    Target Date 08/13/20      PT SHORT TERM GOAL #2   Title Return to sleeping in bed/no further need to sleep in recliner    Baseline Patient continues to sleep in recliner    Time 3    Period Weeks    Status Achieved    Target Date 08/13/20             PT Long Term Goals - 09/03/20 1553      PT LONG TERM GOAL #1   Title Independent with advanced HEP for continued progress after d/c from therapy    Baseline ongoing for updates    Time 6    Period Weeks    Status On-going    Target Date 10/15/20      PT LONG TERM GOAL #2   Title Improve FOTO outcome measure score to 44% or less impairment    Baseline Reported worsening in FOTO score  from 57% to 64% limitation    Time 6    Period Weeks    Status On-going    Target Date 10/15/20      PT LONG TERM GOAL #3   Title Increase trunk flexion AROM at least 20 deg to improve ability for bending for chores and activities such as donning shoes    Baseline Exhibits improve forward bend but continues to be limited    Time 6    Period Weeks    Status On-going    Target Date 10/15/20      PT LONG TERM GOAL #4   Title Tolerate standing/ambulation for periods at least 20-30 min for activities such as grocery shopping with LBP/LLE pain 3/10 or less    Baseline still ongoing    Time 6    Period Weeks    Status On-going    Target Date 10/15/20      PT LONG TERM GOAL #5   Title Increase left LE strength at least grossly 1/2 MMT grade to improve ability for stair navigation and lifting for chores    Baseline Patient exhibits improve LE strength, see flowsheet    Time 6    Period Weeks    Status On-going    Target Date 10/15/20                 Plan - 09/08/20 1408    Clinical Impression Statement Patient tolerated therapy well without adverse effects. She continues to report improvement and is tolerating greater level of exercises, but does have increased pain and difficulty with active exercise especially on left side. Therapy continues to work on more flexion biased stretching and did incorporate hip flexor stretching this visit. She would benefit from continued skilled PT to reduce pain and progress mobility to return to prior level of function.    PT Treatment/Interventions ADLs/Self Care Home Management;Cryotherapy;Traction;Therapeutic exercise;Ultrasound;Electrical Stimulation;Moist Heat;Therapeutic activities;Functional mobility training;Neuromuscular re-education;Patient/family education;Manual techniques;Dry needling;Taping    PT Next Visit Plan Continue performance/progression  of flexion bias trunk ROM, stretches, gentle core, hip LAD/manual and modalities prn for  pain    PT Home Exercise Plan [email or web address]    Consulted and Agree with Plan of Care Patient           Patient will benefit from skilled therapeutic intervention in order to improve the following deficits and impairments:  Pain, Postural dysfunction, Impaired flexibility, Decreased strength, Decreased activity tolerance, Decreased range of motion, Difficulty walking, Hypomobility, Increased muscle spasms  Visit Diagnosis: Radiculopathy, lumbar region  Muscle weakness (generalized)  Difficulty in walking, not elsewhere classified     Problem List Patient Active Problem List   Diagnosis Date Noted   Bradycardia 08/20/2020   Acute cervical radiculopathy 07/09/2020   Pruritus 02/06/2020   Raynaud's phenomenon 01/06/2020   Leg cramping 07/25/2018   Left wrist tendinitis 04/30/2018   Left-sided low back pain with sciatica 04/30/2018   Hand numbness 01/24/2018   Lumbar radiculopathy 12/23/2016   Plantar fasciitis of right foot 06/22/2016   Leg weakness, bilateral 06/22/2016   Cough 12/19/2014   Carpal tunnel syndrome of right wrist 03/07/2013   Myasthenia gravis without exacerbation (HCC) 12/31/2010   ALOPECIA 12/31/2010   Hypothyroidism 12/29/2010   Type 2 diabetes, diet controlled (HCC) 12/29/2010   Hyperlipidemia 12/29/2010   Essential hypertension 12/29/2010   Coronary atherosclerosis 12/29/2010   OSTEOARTHRITIS 12/29/2010    Rosana Hoes, PT, DPT, LAT, ATC 09/08/20  2:45 PM Phone: [phone] Fax: [phone]   [facility] [address] [place], [place], [address] Phone: [phone]   Fax:  [phone]  Name: Vaness Jelinski MRN: [id number] Date of Birth: 09/05/1951

## 2020-09-10 ENCOUNTER — Encounter: Payer: Self-pay | Admitting: Physical Therapy

## 2020-09-10 ENCOUNTER — Other Ambulatory Visit: Payer: Self-pay

## 2020-09-10 ENCOUNTER — Ambulatory Visit: Payer: Medicare HMO | Admitting: Physical Therapy

## 2020-09-10 DIAGNOSIS — R262 Difficulty in walking, not elsewhere classified: Secondary | ICD-10-CM | POA: Diagnosis not present

## 2020-09-10 DIAGNOSIS — M5416 Radiculopathy, lumbar region: Secondary | ICD-10-CM

## 2020-09-10 DIAGNOSIS — M6281 Muscle weakness (generalized): Secondary | ICD-10-CM | POA: Diagnosis not present

## 2020-09-10 NOTE — Therapy (Signed)
[facility] Outpatient Rehabilitation [facility] [address] [place], [place], [address] Phone: [phone]   Fax:  [phone]  Physical Therapy Treatment  Patient Details  Name: Gloria [name] MRN: [id number] Date of Birth: 27-Sep-1951 Referring Provider (PT): Cheryll Cockayne, MD   Encounter Date: 09/10/2020   PT End of Session - 09/10/20 1547    Visit Number 12    Number of Visits 22    Date for PT Re-Evaluation 10/15/20    Authorization Type MCR    Progress Note Due on Visit 20    PT Start Time 1531    PT Stop Time 1615    PT Time Calculation (min) 44 min    Activity Tolerance Patient limited by pain;Patient tolerated treatment well    Behavior During Therapy [facility] for tasks assessed/performed           Past Medical History:  Diagnosis Date  . Alopecia areata 10/2010 onset  . Diabetes mellitus, type 2 (HCC)   . Dyslipidemia   . Hypertension   . Hypothyroid   . Myasthenia gravis     Past Surgical History:  Procedure Laterality Date  . ABDOMINAL HYSTERECTOMY  1990    There were no vitals filed for this visit.   Subjective Assessment - 09/10/20 1546    Subjective Patient reports continued improvement. No spasms since last visit.    Patient Stated Goals Get rid of back/leg pain    Currently in Pain? Yes    Pain Score 5     Pain Location Back    Pain Orientation Lower;Left    Pain Descriptors / Indicators Aching;Sharp    Pain Type Acute pain    Pain Onset More than a month ago    Pain Frequency Constant              [facility] PT Assessment - 09/10/20 0001      AROM   Lumbar Flexion 75% - patient reports less pain                         [facility] Adult PT Treatment/Exercise - 09/10/20 0001      Exercises   Exercises Lumbar      Lumbar Exercises: Stretches   Single Knee to Chest Stretch 5 reps;10 seconds    Single Knee to Chest Stretch Limitations PROM, limited range    Lower Trunk Rotation 3 reps;10 seconds    Hip  Flexor Stretch 3 reps;20 seconds    Hip Flexor Stretch Limitations PROM edge of table    Other Lumbar Stretch Exercise Seated lumbar flexion with physioball x 10 fwd, x 5 lateral each with 5 sec hold      Lumbar Exercises: Aerobic   Nustep L3 x 4 min (UE and LE)      Lumbar Exercises: Supine   Pelvic Tilt 10 reps   2 sets   Pelvic Tilt Limitations no hold, partial bridge    Clam 10 reps;3 seconds   2 sets   Clam Limitations red band    Bent Knee Raise 10 reps   2 sets   Bent Knee Raise Limitations alternating marching, partial range on left      Cryotherapy   Number Minutes Cryotherapy 5 Minutes    Cryotherapy Location Lumbar Spine    Type of Cryotherapy Ice pack      Manual Therapy   Manual Therapy Joint mobilization;Soft tissue mobilization    Joint Mobilization Left hip LAD with gentle oscillations  for multiple bouts    Soft tissue mobilization FR to lateral gluteal region/hip, lateral thigh and                  PT Education - 09/10/20 1547    Education Details HEP    Person(s) Educated Patient    Methods Explanation    Comprehension Verbalized understanding;Need further instruction            PT Short Term Goals - 09/03/20 1552      PT SHORT TERM GOAL #1   Title Independent with initial HEP    Baseline Patient inconsistent with hep due to pain level    Time 3    Period Weeks    Status Achieved    Target Date 08/13/20      PT SHORT TERM GOAL #2   Title Return to sleeping in bed/no further need to sleep in recliner    Baseline Patient continues to sleep in recliner    Time 3    Period Weeks    Status Achieved    Target Date 08/13/20             PT Long Term Goals - 09/03/20 1553      PT LONG TERM GOAL #1   Title Independent with advanced HEP for continued progress after d/c from therapy    Baseline ongoing for updates    Time 6    Period Weeks    Status On-going    Target Date 10/15/20      PT LONG TERM GOAL #2   Title Improve FOTO  outcome measure score to 44% or less impairment    Baseline Reported worsening in FOTO score from 57% to 64% limitation    Time 6    Period Weeks    Status On-going    Target Date 10/15/20      PT LONG TERM GOAL #3   Title Increase trunk flexion AROM at least 20 deg to improve ability for bending for chores and activities such as donning shoes    Baseline Exhibits improve forward bend but continues to be limited    Time 6    Period Weeks    Status On-going    Target Date 10/15/20      PT LONG TERM GOAL #4   Title Tolerate standing/ambulation for periods at least 20-30 min for activities such as grocery shopping with LBP/LLE pain 3/10 or less    Baseline still ongoing    Time 6    Period Weeks    Status On-going    Target Date 10/15/20      PT LONG TERM GOAL #5   Title Increase left LE strength at least grossly 1/2 MMT grade to improve ability for stair navigation and lifting for chores    Baseline Patient exhibits improve LE strength, see flowsheet    Time 6    Period Weeks    Status On-going    Target Date 10/15/20                 Plan - 09/10/20 1548    Clinical Impression Statement Patient tolerated therapy well without adverse effects. Continued focus on use of manual to reduce pain and progressing movement and strengthening as tolerated. She did tolerate more exercise and movement this visit and reports reduced pain with therapy. She is scheduled for MRI on 09/15/2020. She would benefit from continued skilled PT to reduce pain and progress mobility to return to prior level of  function.    PT Treatment/Interventions ADLs/Self Care Home Management;Cryotherapy;Traction;Therapeutic exercise;Ultrasound;Electrical Stimulation;Moist Heat;Therapeutic activities;Functional mobility training;Neuromuscular re-education;Patient/family education;Manual techniques;Dry needling;Taping    PT Next Visit Plan Continue performance/progression of flexion bias trunk ROM, stretches, gentle  core, hip LAD/manual and modalities prn for pain    PT Home Exercise Plan [email or web address]    Consulted and Agree with Plan of Care Patient           Patient will benefit from skilled therapeutic intervention in order to improve the following deficits and impairments:  Pain, Postural dysfunction, Impaired flexibility, Decreased strength, Decreased activity tolerance, Decreased range of motion, Difficulty walking, Hypomobility, Increased muscle spasms  Visit Diagnosis: Radiculopathy, lumbar region  Muscle weakness (generalized)  Difficulty in walking, not elsewhere classified     Problem List Patient Active Problem List   Diagnosis Date Noted  . Bradycardia 08/20/2020  . Acute cervical radiculopathy 07/09/2020  . Pruritus 02/06/2020  . Raynaud's phenomenon 01/06/2020  . Leg cramping 07/25/2018  . Left wrist tendinitis 04/30/2018  . Left-sided low back pain with sciatica 04/30/2018  . Hand numbness 01/24/2018  . Lumbar radiculopathy 12/23/2016  . Plantar fasciitis of right foot 06/22/2016  . Leg weakness, bilateral 06/22/2016  . Cough 12/19/2014  . Carpal tunnel syndrome of right wrist 03/07/2013  . Myasthenia gravis without exacerbation (HCC) 12/31/2010  . ALOPECIA 12/31/2010  . Hypothyroidism 12/29/2010  . Type 2 diabetes, diet controlled (HCC) 12/29/2010  . Hyperlipidemia 12/29/2010  . Essential hypertension 12/29/2010  . Coronary atherosclerosis 12/29/2010  . OSTEOARTHRITIS 12/29/2010    Rosana Hoes, PT, DPT, LAT, ATC 09/10/20  4:15 PM Phone: [phone] Fax: [phone]   [facility] [address] [place], [place], [address] Phone: [phone]   Fax:  [phone]  Name: Gloria [name] MRN: [id number] Date of Birth: Sep 05, 1951

## 2020-09-14 ENCOUNTER — Telehealth: Payer: Self-pay | Admitting: Internal Medicine

## 2020-09-14 NOTE — Telephone Encounter (Signed)
Patient called and was wondering if we had received a letter from [facility] In regards to a mobile life alert. Please call the pt back [phone]

## 2020-09-15 ENCOUNTER — Other Ambulatory Visit: Payer: Self-pay

## 2020-09-15 ENCOUNTER — Encounter: Payer: Self-pay | Admitting: Physical Therapy

## 2020-09-15 ENCOUNTER — Ambulatory Visit: Payer: Medicare HMO | Attending: Internal Medicine | Admitting: Physical Therapy

## 2020-09-15 ENCOUNTER — Ambulatory Visit
Admission: RE | Admit: 2020-09-15 | Discharge: 2020-09-15 | Disposition: A | Discharge status: Home / Self Care | Payer: Medicare HMO | Source: Ambulatory Visit | Attending: Orthopaedic Surgery | Admitting: Orthopaedic Surgery

## 2020-09-15 DIAGNOSIS — M48061 Spinal stenosis, lumbar region without neurogenic claudication: Secondary | ICD-10-CM | POA: Diagnosis not present

## 2020-09-15 DIAGNOSIS — M4807 Spinal stenosis, lumbosacral region: Secondary | ICD-10-CM

## 2020-09-15 DIAGNOSIS — R262 Difficulty in walking, not elsewhere classified: Secondary | ICD-10-CM | POA: Diagnosis not present

## 2020-09-15 DIAGNOSIS — M5416 Radiculopathy, lumbar region: Secondary | ICD-10-CM | POA: Diagnosis not present

## 2020-09-15 DIAGNOSIS — M6281 Muscle weakness (generalized): Secondary | ICD-10-CM | POA: Insufficient documentation

## 2020-09-15 NOTE — Therapy (Signed)
[facility] Outpatient Rehabilitation [facility] [address] [place], [place], [address] Phone: [phone]   Fax:  [phone]  Physical Therapy Treatment  Patient Details  Name: Gloria [name] MRN: [id number] Date of Birth: [birth year] Referring Provider (PT): Cheryll Cockayne, MD   Encounter Date: 09/15/2020   PT End of Session - 09/15/20 1410    Visit Number 13    Number of Visits 22    Authorization Type MCR    Progress Note Due on Visit 20    PT Start Time 1404    PT Stop Time 1453    PT Time Calculation (min) 49 min    Activity Tolerance Patient limited by pain;Patient tolerated treatment well    Behavior During Therapy [facility] for tasks assessed/performed           Past Medical History:  Diagnosis Date  . Alopecia areata 10/2010 onset  . Diabetes mellitus, type 2 (HCC)   . Dyslipidemia   . Hypertension   . Hypothyroid   . Myasthenia gravis     Past Surgical History:  Procedure Laterality Date  . ABDOMINAL HYSTERECTOMY  1990    There were no vitals filed for this visit.   Subjective Assessment - 09/15/20 1408    Subjective Patient reports she is doing well today, she feels she continues to improve.    Patient Stated Goals Get rid of back/leg pain    Currently in Pain? Yes    Pain Score 4     Pain Location Back    Pain Orientation Lower;Left    Pain Descriptors / Indicators Aching    Pain Type Acute pain    Pain Radiating Towards left lateral leg to shin/calf region    Pain Onset More than a month ago    Pain Frequency Constant                             [facility] Adult PT Treatment/Exercise - 09/15/20 0001      Exercises   Exercises Lumbar      Lumbar Exercises: Stretches   Passive Hamstring Stretch 5 reps;10 seconds    Passive Hamstring Stretch Limitations PROM, limited range, slight bend in knee    Single Knee to Chest Stretch 5 reps;10 seconds    Single Knee to Chest Stretch Limitations PROM, limited range     Lower Trunk Rotation 4 reps;10 seconds    Hip Flexor Stretch 3 reps;20 seconds    Hip Flexor Stretch Limitations PROM edge of table    Other Lumbar Stretch Exercise Seated lumbar flexion with physioball x 10 fwd, x 5 lateral each with 5 sec hold      Lumbar Exercises: Aerobic   Nustep L4 x 4 min (UE and LE)      Lumbar Exercises: Supine   Pelvic Tilt 10 reps   2 sets   Pelvic Tilt Limitations no hold, partial bridge    Clam 10 reps;3 seconds   2 sets   Clam Limitations red band    Bent Knee Raise 10 reps   2 sets   Bent Knee Raise Limitations alternating marching, partial range on left      Cryotherapy   Number Minutes Cryotherapy 8 Minutes    Cryotherapy Location Lumbar Spine    Type of Cryotherapy Ice pack      Manual Therapy   Manual Therapy Joint mobilization;Soft tissue mobilization    Joint Mobilization Left hip LAD with gentle oscillations  for multiple bouts    Soft tissue mobilization FR to lateral gluteal region/hip, lateral thigh and                  PT Education - 09/15/20 1409    Education Details HEP    Person(s) Educated Patient    Methods Explanation    Comprehension Verbalized understanding;Need further instruction            PT Short Term Goals - 09/03/20 1552      PT SHORT TERM GOAL #1   Title Independent with initial HEP    Baseline Patient inconsistent with hep due to pain level    Time 3    Period Weeks    Status Achieved    Target Date 08/13/20      PT SHORT TERM GOAL #2   Title Return to sleeping in bed/no further need to sleep in recliner    Baseline Patient continues to sleep in recliner    Time 3    Period Weeks    Status Achieved    Target Date 08/13/20             PT Long Term Goals - 09/03/20 1553      PT LONG TERM GOAL #1   Title Independent with advanced HEP for continued progress after d/c from therapy    Baseline ongoing for updates    Time 6    Period Weeks    Status On-going    Target Date 10/15/20       PT LONG TERM GOAL #2   Title Improve FOTO outcome measure score to 44% or less impairment    Baseline Reported worsening in FOTO score from 57% to 64% limitation    Time 6    Period Weeks    Status On-going    Target Date 10/15/20      PT LONG TERM GOAL #3   Title Increase trunk flexion AROM at least 20 deg to improve ability for bending for chores and activities such as donning shoes    Baseline Exhibits improve forward bend but continues to be limited    Time 6    Period Weeks    Status On-going    Target Date 10/15/20      PT LONG TERM GOAL #4   Title Tolerate standing/ambulation for periods at least 20-30 min for activities such as grocery shopping with LBP/LLE pain 3/10 or less    Baseline still ongoing    Time 6    Period Weeks    Status On-going    Target Date 10/15/20      PT LONG TERM GOAL #5   Title Increase left LE strength at least grossly 1/2 MMT grade to improve ability for stair navigation and lifting for chores    Baseline Patient exhibits improve LE strength, see flowsheet    Time 6    Period Weeks    Status On-going    Target Date 10/15/20                 Plan - 09/15/20 1410    Clinical Impression Statement Patient tolerated therapy well without adverse effects. She is tolerating greater levels of exercise but continues to overall have a poor tolerance for therapy and activity. Continued with primary flexion bias exercise and light core strengthening. She reports continued improvement in symptoms following manual therapy and used ice following therapy as patient states this helps. She does have MRI scheduled later today so will  determine if need update to plan at next visit. She would benefit from continued skilled PT to reduce pain and progress mobility to return to prior level of function.    PT Treatment/Interventions ADLs/Self Care Home Management;Cryotherapy;Traction;Therapeutic exercise;Ultrasound;Electrical Stimulation;Moist Heat;Therapeutic  activities;Functional mobility training;Neuromuscular re-education;Patient/family education;Manual techniques;Dry needling;Taping    PT Next Visit Plan Continue performance/progression of flexion bias trunk ROM, stretches, gentle core, hip LAD/manual and modalities prn for pain    PT Home Exercise Plan [email or web address]    Consulted and Agree with Plan of Care Patient           Patient will benefit from skilled therapeutic intervention in order to improve the following deficits and impairments:  Pain, Postural dysfunction, Impaired flexibility, Decreased strength, Decreased activity tolerance, Decreased range of motion, Difficulty walking, Hypomobility, Increased muscle spasms  Visit Diagnosis: Radiculopathy, lumbar region  Muscle weakness (generalized)  Difficulty in walking, not elsewhere classified     Problem List Patient Active Problem List   Diagnosis Date Noted  . Bradycardia 08/20/2020  . Acute cervical radiculopathy 07/09/2020  . Pruritus 02/06/2020  . Raynaud's phenomenon 01/06/2020  . Leg cramping 07/25/2018  . Left wrist tendinitis 04/30/2018  . Left-sided low back pain with sciatica 04/30/2018  . Hand numbness 01/24/2018  . Lumbar radiculopathy 12/23/2016  . Plantar fasciitis of right foot 06/22/2016  . Leg weakness, bilateral 06/22/2016  . Cough 12/19/2014  . Carpal tunnel syndrome of right wrist 03/07/2013  . Myasthenia gravis without exacerbation (HCC) 12/31/2010  . ALOPECIA 12/31/2010  . Hypothyroidism 12/29/2010  . Type 2 diabetes, diet controlled (HCC) 12/29/2010  . Hyperlipidemia 12/29/2010  . Essential hypertension 12/29/2010  . Coronary atherosclerosis 12/29/2010  . OSTEOARTHRITIS 12/29/2010    Rosana Hoes, PT, DPT, LAT, ATC 09/15/20  2:53 PM Phone: [phone] Fax: [phone]   [facility] Outpatient Rehabilitation [facility] [address] [place], [place], [address] Phone: [phone]   Fax:  [phone]  Name: Gloria [name] MRN: [id number] Date of Birth: 1951-12-07

## 2020-09-17 ENCOUNTER — Ambulatory Visit: Payer: Medicare HMO | Admitting: Physical Therapy

## 2020-09-17 ENCOUNTER — Other Ambulatory Visit: Payer: Self-pay

## 2020-09-17 ENCOUNTER — Encounter: Payer: Self-pay | Admitting: Physical Therapy

## 2020-09-17 DIAGNOSIS — M6281 Muscle weakness (generalized): Secondary | ICD-10-CM | POA: Diagnosis not present

## 2020-09-17 DIAGNOSIS — M5416 Radiculopathy, lumbar region: Secondary | ICD-10-CM

## 2020-09-17 DIAGNOSIS — R262 Difficulty in walking, not elsewhere classified: Secondary | ICD-10-CM | POA: Diagnosis not present

## 2020-09-17 NOTE — Telephone Encounter (Signed)
Called and spoke with patient today. 

## 2020-09-17 NOTE — Therapy (Signed)
[facility] Outpatient Rehabilitation [facility] [address] [place], [place], [address] Phone: [phone]   Fax:  [phone]  Physical Therapy Treatment  Patient Details  Name: Gloria [name] MRN: [id number] Date of Birth: 04-13-1951 Referring Provider (PT): Cheryll Cockayne, MD   Encounter Date: 09/17/2020   PT End of Session - 09/17/20 1409    Visit Number 14    Number of Visits 22    Date for PT Re-Evaluation 10/15/20    Authorization Type MCR    Progress Note Due on Visit 20    PT Start Time 1403    PT Stop Time 1455    PT Time Calculation (min) 52 min    Activity Tolerance Patient limited by pain    Behavior During Therapy [facility] for tasks assessed/performed           Past Medical History:  Diagnosis Date  . Alopecia areata 10/2010 onset  . Diabetes mellitus, type 2 (HCC)   . Dyslipidemia   . Hypertension   . Hypothyroid   . Myasthenia gravis     Past Surgical History:  Procedure Laterality Date  . ABDOMINAL HYSTERECTOMY  1990    There were no vitals filed for this visit.   Subjective Assessment - 09/17/20 1406    Subjective Patient reports she had the MRI for her low back, hasn't hear back from her doctor. She also notes bruising around the left knee and is not sure if that had anything to do with her back pain. She is feeling a little more low back pain and tightness this visit.    Patient Stated Goals Get rid of back/leg pain    Currently in Pain? Yes    Pain Score 6     Pain Location Back    Pain Orientation Left;Lower    Pain Descriptors / Indicators Aching    Pain Type Acute pain    Pain Radiating Towards left lateral leg    Pain Onset More than a month ago    Pain Frequency Constant              [facility] PT Assessment - 09/17/20 0001      Assessment   Medical Diagnosis Lumbar radiculopathy    Referring Provider (PT) Cheryll Cockayne, MD    Onset Date/Surgical Date 06/25/20      AROM   Lumbar Flexion 75%    Lumbar Extension  50% - increased back and leg pain                         [facility] Adult PT Treatment/Exercise - 09/17/20 0001      Exercises   Exercises Lumbar      Lumbar Exercises: Stretches   Passive Hamstring Stretch 5 reps;10 seconds    Passive Hamstring Stretch Limitations PROM, limited range but improved from previous sessions    Double Knee to Chest Stretch 5 reps    Double Knee to Chest Stretch Limitations Feet elevated on physioball    Other Lumbar Stretch Exercise Seated lumbar flexion with physioball x 10 fwd    Other Lumbar Stretch Exercise Child's pose rock back x 10 for lumbar flexion      Lumbar Exercises: Standing   Other Standing Lumbar Exercises Trialed standing manual right shift correction x 10, against wall x5      Modalities   Modalities Cryotherapy      Cryotherapy   Number Minutes Cryotherapy 10 Minutes    Cryotherapy Location Lumbar  Spine   left lower back and hip region   Type of Cryotherapy Ice pack      Manual Therapy   Manual Therapy Manual Traction;Soft tissue mobilization;Joint mobilization    Joint Mobilization Lower lumbar CPA and UPA on left     Soft tissue mobilization FR to lateral gluteal region/hip, lateral thigh and    Manual Traction Left hip LAD with gentle oscillations, Hooklying manual traction using strap                    PT Short Term Goals - 09/03/20 1552      PT SHORT TERM GOAL #1   Title Independent with initial HEP    Baseline Patient inconsistent with hep due to pain level    Time 3    Period Weeks    Status Achieved    Target Date 08/13/20      PT SHORT TERM GOAL #2   Title Return to sleeping in bed/no further need to sleep in recliner    Baseline Patient continues to sleep in recliner    Time 3    Period Weeks    Status Achieved    Target Date 08/13/20             PT Long Term Goals - 09/03/20 1553      PT LONG TERM GOAL #1   Title Independent with advanced HEP for continued progress after d/c  from therapy    Baseline ongoing for updates    Time 6    Period Weeks    Status On-going    Target Date 10/15/20      PT LONG TERM GOAL #2   Title Improve FOTO outcome measure score to 44% or less impairment    Baseline Reported worsening in FOTO score from 57% to 64% limitation    Time 6    Period Weeks    Status On-going    Target Date 10/15/20      PT LONG TERM GOAL #3   Title Increase trunk flexion AROM at least 20 deg to improve ability for bending for chores and activities such as donning shoes    Baseline Exhibits improve forward bend but continues to be limited    Time 6    Period Weeks    Status On-going    Target Date 10/15/20      PT LONG TERM GOAL #4   Title Tolerate standing/ambulation for periods at least 20-30 min for activities such as grocery shopping with LBP/LLE pain 3/10 or less    Baseline still ongoing    Time 6    Period Weeks    Status On-going    Target Date 10/15/20      PT LONG TERM GOAL #5   Title Increase left LE strength at least grossly 1/2 MMT grade to improve ability for stair navigation and lifting for chores    Baseline Patient exhibits improve LE strength, see flowsheet    Time 6    Period Weeks    Status On-going    Target Date 10/15/20                 Plan - 09/17/20 1451    Clinical Impression Statement Patient reported increased left lower back pain this visit so trialed more manual therapy for lumbar spine. She did exhibit slight contralateral shift so trialed shift correction without much benefit, continued with flexion based treatment as extension continues to peripheralize symptoms. Trialed mobilizations for the  lumbar spine without much improvement, she does continue to report reduced left lower back discomfort with manual traction. Patient did note slight overall improvement in symptoms following therapy. She did have her MRI on 09/15/20 but has not heard back from her doctor. She would benefit from continued skilled PT to  reduce pain and progress mobility to return to prior level of function.    PT Treatment/Interventions ADLs/Self Care Home Management;Cryotherapy;Traction;Therapeutic exercise;Ultrasound;Electrical Stimulation;Moist Heat;Therapeutic activities;Functional mobility training;Neuromuscular re-education;Patient/family education;Manual techniques;Dry needling;Taping    PT Next Visit Plan Continue performance/progression of flexion bias trunk ROM, stretches, gentle core, hip LAD/manual and modalities prn for pain    PT Home Exercise Plan [email or web address]    Consulted and Agree with Plan of Care Patient           Patient will benefit from skilled therapeutic intervention in order to improve the following deficits and impairments:  Pain, Postural dysfunction, Impaired flexibility, Decreased strength, Decreased activity tolerance, Decreased range of motion, Difficulty walking, Hypomobility, Increased muscle spasms  Visit Diagnosis: Radiculopathy, lumbar region  Muscle weakness (generalized)  Difficulty in walking, not elsewhere classified     Problem List Patient Active Problem List   Diagnosis Date Noted  . Bradycardia 08/20/2020  . Acute cervical radiculopathy 07/09/2020  . Pruritus 02/06/2020  . Raynaud's phenomenon 01/06/2020  . Leg cramping 07/25/2018  . Left wrist tendinitis 04/30/2018  . Left-sided low back pain with sciatica 04/30/2018  . Hand numbness 01/24/2018  . Lumbar radiculopathy 12/23/2016  . Plantar fasciitis of right foot 06/22/2016  . Leg weakness, bilateral 06/22/2016  . Cough 12/19/2014  . Carpal tunnel syndrome of right wrist 03/07/2013  . Myasthenia gravis without exacerbation (HCC) 12/31/2010  . ALOPECIA 12/31/2010  . Hypothyroidism 12/29/2010  . Type 2 diabetes, diet controlled (HCC) 12/29/2010  . Hyperlipidemia 12/29/2010  . Essential hypertension 12/29/2010  . Coronary atherosclerosis 12/29/2010  . OSTEOARTHRITIS 12/29/2010    Rosana Hoes, PT, DPT, LAT,  ATC 09/17/20  3:05 PM Phone: [phone] Fax: [phone]   [facility] Outpatient Rehabilitation [facility] [address] [place], [place], [address] Phone: [phone]   Fax:  [phone]  Name: Gloria [name] MRN: [id number] Date of Birth: 06/06/1951

## 2020-09-18 ENCOUNTER — Telehealth: Payer: Self-pay | Admitting: Orthopaedic Surgery

## 2020-09-18 NOTE — Telephone Encounter (Signed)
Pt called wanting to know if she could have her MRI results read over the phone? Pt would like a CB with answer please   [phone]

## 2020-09-18 NOTE — Telephone Encounter (Signed)
IC s/w patient and advised per Dr Blackman.  

## 2020-09-18 NOTE — Telephone Encounter (Signed)
MRI results from study such as her are too difficult to describe over the phone.  I prefer she can then so we can actually show her using a spine model about what is going in terms of what is causing her back pain and other symptoms so we can then prescribe the appropriate treatment.

## 2020-09-22 ENCOUNTER — Encounter: Payer: Self-pay | Admitting: Physical Therapy

## 2020-09-22 ENCOUNTER — Ambulatory Visit: Payer: Medicare HMO | Admitting: Physical Therapy

## 2020-09-22 ENCOUNTER — Other Ambulatory Visit: Payer: Self-pay

## 2020-09-22 DIAGNOSIS — M5416 Radiculopathy, lumbar region: Secondary | ICD-10-CM

## 2020-09-22 DIAGNOSIS — R262 Difficulty in walking, not elsewhere classified: Secondary | ICD-10-CM | POA: Diagnosis not present

## 2020-09-22 DIAGNOSIS — M6281 Muscle weakness (generalized): Secondary | ICD-10-CM | POA: Diagnosis not present

## 2020-09-22 NOTE — Therapy (Signed)
[facility] Outpatient Rehabilitation [facility] [address] [place], [place], [address] Phone: [phone]   Fax:  [phone]  Physical Therapy Treatment  Patient Details  Name: Gloria [name] MRN: [id number] Date of Birth: 25-Apr-1951 Referring Provider (PT): Cheryll Cockayne, MD   Encounter Date: 09/22/2020   PT End of Session - 09/22/20 1059    Visit Number 15    Number of Visits 22    Date for PT Re-Evaluation 10/15/20    Authorization Type MCR    Progress Note Due on Visit 20    PT Start Time 1046    PT Stop Time 1135    PT Time Calculation (min) 49 min    Activity Tolerance Patient limited by pain;Patient tolerated treatment well    Behavior During Therapy [facility] for tasks assessed/performed           Past Medical History:  Diagnosis Date  . Alopecia areata 10/2010 onset  . Diabetes mellitus, type 2 (HCC)   . Dyslipidemia   . Hypertension   . Hypothyroid   . Myasthenia gravis     Past Surgical History:  Procedure Laterality Date  . ABDOMINAL HYSTERECTOMY  1990    There were no vitals filed for this visit.   Subjective Assessment - 09/22/20 1047    Subjective Patient reports she is doing well, states that her lower back is bothering her some. She has scheduled an appointment with her orthopedic on Thursday 09/24/2020.    Patient Stated Goals Get rid of back/leg pain    Currently in Pain? Yes    Pain Score 5     Pain Location Back    Pain Orientation Left;Lower    Pain Descriptors / Indicators Aching    Pain Type Acute pain    Pain Radiating Towards left lateral leg    Pain Onset More than a month ago    Pain Frequency Constant              [facility] PT Assessment - 09/22/20 0001      Assessment   Medical Diagnosis Lumbar radiculopathy    Referring Provider (PT) Cheryll Cockayne, MD    Onset Date/Surgical Date 06/25/20                         [facility] Adult PT Treatment/Exercise - 09/22/20 0001      Exercises    Exercises Lumbar      Lumbar Exercises: Stretches   Single Knee to Chest Stretch 5 reps;10 seconds    Single Knee to Chest Stretch Limitations supine using towel behind thigh for assist on left, hands behind thigh on right    Lower Trunk Rotation 5 reps;10 seconds    Other Lumbar Stretch Exercise Seated lumbar flexion with physioball x 10 fwd and side    Other Lumbar Stretch Exercise Child's pose rock back x 10 for lumbar flexion   on forearms     Lumbar Exercises: Supine   Pelvic Tilt 10 reps;5 seconds    Clam 10 reps;3 seconds    Clam Limitations red band    Bent Knee Raise 10 reps    Bent Knee Raise Limitations alternating marching, partial range on left      Lumbar Exercises: Quadruped   Madcat/Old Horse 5 reps    Madcat/Old Horse Limitations partial range      Modalities   Modalities Cryotherapy      Cryotherapy   Number Minutes Cryotherapy 10 Minutes  Cryotherapy Location Lumbar Spine   prone   Type of Cryotherapy Ice pack      Manual Therapy   Manual Therapy Manual Traction;Soft tissue mobilization;Joint mobilization    Soft tissue mobilization FR to lateral gluteal region/hip, lateral thigh and    Manual Traction Left hip LAD with gentle oscillations, Hooklying manual traction using strap                  PT Education - 09/22/20 1051    Education Details HEP    Person(s) Educated Patient    Methods Explanation    Comprehension Verbalized understanding;Need further instruction            PT Short Term Goals - 09/03/20 1552      PT SHORT TERM GOAL #1   Title Independent with initial HEP    Baseline Patient inconsistent with hep due to pain level    Time 3    Period Weeks    Status Achieved    Target Date 08/13/20      PT SHORT TERM GOAL #2   Title Return to sleeping in bed/no further need to sleep in recliner    Baseline Patient continues to sleep in recliner    Time 3    Period Weeks    Status Achieved    Target Date 08/13/20              PT Long Term Goals - 09/03/20 1553      PT LONG TERM GOAL #1   Title Independent with advanced HEP for continued progress after d/c from therapy    Baseline ongoing for updates    Time 6    Period Weeks    Status On-going    Target Date 10/15/20      PT LONG TERM GOAL #2   Title Improve FOTO outcome measure score to 44% or less impairment    Baseline Reported worsening in FOTO score from 57% to 64% limitation    Time 6    Period Weeks    Status On-going    Target Date 10/15/20      PT LONG TERM GOAL #3   Title Increase trunk flexion AROM at least 20 deg to improve ability for bending for chores and activities such as donning shoes    Baseline Exhibits improve forward bend but continues to be limited    Time 6    Period Weeks    Status On-going    Target Date 10/15/20      PT LONG TERM GOAL #4   Title Tolerate standing/ambulation for periods at least 20-30 min for activities such as grocery shopping with LBP/LLE pain 3/10 or less    Baseline still ongoing    Time 6    Period Weeks    Status On-going    Target Date 10/15/20      PT LONG TERM GOAL #5   Title Increase left LE strength at least grossly 1/2 MMT grade to improve ability for stair navigation and lifting for chores    Baseline Patient exhibits improve LE strength, see flowsheet    Time 6    Period Weeks    Status On-going    Target Date 10/15/20                 Plan - 09/22/20 1110    Clinical Impression Statement Patient with better tolerance for therapy this visit. She reported more lower back pain vs. leg pain this visit. Continued with  manual as patient reports reduction in low back pain with this, and with lumbar mobility and light core strengthening. She continues to have increased left lower back pain with most exercises but is gradually moving through greater ranges of motion and with reduced left leg referral. She would benefit from continued skilled PT to reduce pain and progress mobility  to return to prior level of function.    PT Treatment/Interventions ADLs/Self Care Home Management;Cryotherapy;Traction;Therapeutic exercise;Ultrasound;Electrical Stimulation;Moist Heat;Therapeutic activities;Functional mobility training;Neuromuscular re-education;Patient/family education;Manual techniques;Dry needling;Taping    PT Next Visit Plan Continue performance/progression of flexion bias trunk ROM, stretches, gentle core, hip LAD/manual and modalities prn for pain    PT Home Exercise Plan [email or web address]    Consulted and Agree with Plan of Care Patient           Patient will benefit from skilled therapeutic intervention in order to improve the following deficits and impairments:  Pain, Postural dysfunction, Impaired flexibility, Decreased strength, Decreased activity tolerance, Decreased range of motion, Difficulty walking, Hypomobility, Increased muscle spasms  Visit Diagnosis: Radiculopathy, lumbar region  Muscle weakness (generalized)  Difficulty in walking, not elsewhere classified     Problem List Patient Active Problem List   Diagnosis Date Noted  . Bradycardia 08/20/2020  . Acute cervical radiculopathy 07/09/2020  . Pruritus 02/06/2020  . Raynaud's phenomenon 01/06/2020  . Leg cramping 07/25/2018  . Left wrist tendinitis 04/30/2018  . Left-sided low back pain with sciatica 04/30/2018  . Hand numbness 01/24/2018  . Lumbar radiculopathy 12/23/2016  . Plantar fasciitis of right foot 06/22/2016  . Leg weakness, bilateral 06/22/2016  . Cough 12/19/2014  . Carpal tunnel syndrome of right wrist 03/07/2013  . Myasthenia gravis without exacerbation (HCC) 12/31/2010  . ALOPECIA 12/31/2010  . Hypothyroidism 12/29/2010  . Type 2 diabetes, diet controlled (HCC) 12/29/2010  . Hyperlipidemia 12/29/2010  . Essential hypertension 12/29/2010  . Coronary atherosclerosis 12/29/2010  . OSTEOARTHRITIS 12/29/2010    Rosana Hoes, PT, DPT, LAT, ATC 09/22/20  11:32 AM Phone:  [phone] Fax: [phone]   [facility] Outpatient Rehabilitation [facility] [address] [place], [place], [address] Phone: [phone]   Fax:  [phone]  Name: Gloria [name] MRN: [id number] Date of Birth: 03-Mar-1951

## 2020-09-23 ENCOUNTER — Ambulatory Visit: Payer: Medicare HMO | Admitting: Orthopaedic Surgery

## 2020-09-24 ENCOUNTER — Ambulatory Visit (INDEPENDENT_AMBULATORY_CARE_PROVIDER_SITE_OTHER): Payer: Medicare HMO | Admitting: Orthopaedic Surgery

## 2020-09-24 ENCOUNTER — Other Ambulatory Visit: Payer: Self-pay

## 2020-09-24 DIAGNOSIS — M4807 Spinal stenosis, lumbosacral region: Secondary | ICD-10-CM

## 2020-09-24 DIAGNOSIS — M5442 Lumbago with sciatica, left side: Secondary | ICD-10-CM | POA: Diagnosis not present

## 2020-09-24 DIAGNOSIS — G8929 Other chronic pain: Secondary | ICD-10-CM

## 2020-09-24 MED ORDER — DIAZEPAM 5 MG PO TABS: 5.0000 mg | ORAL_TABLET | Freq: Two times a day (BID) | ORAL | 0 refills | Status: DC | PRN

## 2020-09-24 NOTE — Progress Notes (Signed)
The patient comes in today to go over the MRI of the lumbar spine.  She is having significant low back pain more to the left with some radicular components of radiating into the sciatic region and down her leg.  She says today is more of just across her back and much more on the left than the right with no radicular component.  MRIs reviewed with her and it does show significant L4-L5 bilateral facet arthritic changes.  There is also impingement on the nerves at the L4-L5 level due to this arthritic changes combined with some spondylolisthesis and disc bulging.  She is interested in at least trying an intervention by Dr. Alvester Morin.  I believe this likely should be a L4-L5 facet joint injection to the left side as opposed to a transforaminal injection at that level.  I think this will depend on her symptoms the day that she sees Dr. Alvester Morin.  She is nervous about this I will send in a Valium for her to take before the procedure.  Her daughter will take her to the procedure as well.  All questions and concerns were answered and addressed.  We will call her when can have this scheduled.

## 2020-09-29 ENCOUNTER — Other Ambulatory Visit: Payer: Self-pay

## 2020-09-29 ENCOUNTER — Telehealth: Payer: Self-pay

## 2020-09-29 DIAGNOSIS — G8929 Other chronic pain: Secondary | ICD-10-CM

## 2020-09-29 NOTE — Telephone Encounter (Signed)
I put in an order for this.

## 2020-09-29 NOTE — Telephone Encounter (Signed)
Please advise. I do not see a referral in her chart.

## 2020-09-29 NOTE — Telephone Encounter (Signed)
patient called she wants to schedule appointment with Dr.Newton PER Magnus Ivan call [phone]

## 2020-09-30 NOTE — Telephone Encounter (Signed)
Pt has been approve [employer] [id number]

## 2020-10-02 ENCOUNTER — Ambulatory Visit: Payer: Medicare HMO | Admitting: Physical Therapy

## 2020-10-06 ENCOUNTER — Ambulatory Visit: Payer: Medicare HMO | Admitting: Physical Therapy

## 2020-10-06 ENCOUNTER — Encounter: Payer: Self-pay | Admitting: Physical Therapy

## 2020-10-06 ENCOUNTER — Other Ambulatory Visit: Payer: Self-pay

## 2020-10-06 DIAGNOSIS — M5416 Radiculopathy, lumbar region: Secondary | ICD-10-CM

## 2020-10-06 DIAGNOSIS — R262 Difficulty in walking, not elsewhere classified: Secondary | ICD-10-CM | POA: Diagnosis not present

## 2020-10-06 DIAGNOSIS — M6281 Muscle weakness (generalized): Secondary | ICD-10-CM

## 2020-10-06 NOTE — Therapy (Signed)
[facility] Outpatient Rehabilitation [facility] [address] [place], [place], [address] Phone: [phone]   Fax:  [phone]  Physical Therapy Treatment  Patient Details  Name: Gloria [name] MRN: [id number] Date of Birth: 1951-08-29 Referring Provider (PT): Cheryll Cockayne, MD   Encounter Date: 10/06/2020   PT End of Session - 10/06/20 1422    Visit Number 16    Number of Visits 22    Date for PT Re-Evaluation 10/15/20    Authorization Type MCR-add KX    Progress Note Due on Visit 20    PT Start Time 1420    PT Stop Time 1508    PT Time Calculation (min) 48 min    Activity Tolerance Patient limited by pain    Behavior During Therapy [facility] for tasks assessed/performed           Past Medical History:  Diagnosis Date   Alopecia areata 10/2010 onset   Diabetes mellitus, type 2 (HCC)    Dyslipidemia    Hypertension    Hypothyroid    Myasthenia gravis     Past Surgical History:  Procedure Laterality Date   ABDOMINAL HYSTERECTOMY  1990    There were no vitals filed for this visit.   Subjective Assessment - 10/06/20 1424    Subjective Pt. had follow up with Dr. Magnus Ivan who recommended injections (facet) now scheduled for 10/27/20. MRI results were reviewed at MD visit including facet arhritis and stenosis withsome disc bulging. Current pain in low back and into left posterior proximal ihp but nothing distal to this this PM but not distal to hip region on a regular basis/still experiences more distal symptoms occasionally.    Pertinent History previous history lumbar radiculopathy, diabetic, myasthenia gravis    Currently in Pain? Yes    Pain Score --   4-5   Pain Location Back    Pain Orientation Left;Lower    Pain Descriptors / Indicators Aching    Pain Type Acute pain    Pain Radiating Towards left posterior hip    Pain Onset More than a month ago    Pain Frequency Constant    Aggravating Factors  no specific aggs    Pain Relieving  Factors ice                             [facility] Adult PT Treatment/Exercise - 10/06/20 0001      Lumbar Exercises: Stretches   Single Knee to Chest Stretch Limitations 10 reps 5-10 sec holds on left, x 5 reps on right limited due to pain with therapist assistance    Other Lumbar Stretch Exercise standing lumbar flexion with 85 cm P-ball roll out x7 reps held due to increased LBP      Lumbar Exercises: Supine   Pelvic Tilt 15 reps    Clam 15 reps;3 seconds    Bent Knee Raise Limitations 12 reps alternating marches    Other Supine Lumbar Exercises hip adduction isometric with small ball squeeze 3 sec holds x 15 reps    Other Supine Lumbar Exercises abd. bracing with alt. UE raises with 2 lb. weights x 12 reps bilat.      Lumbar Exercises: Quadruped   Madcat/Old Horse 10 reps    Madcat/Old Horse Limitations partial range      Cryotherapy   Number Minutes Cryotherapy 10 Minutes    Cryotherapy Location Lumbar Spine    Type of Cryotherapy Ice pack  Manual Therapy   Manual Traction LAD bilat. hips grade I-III focus left side                    PT Short Term Goals - 09/03/20 1552      PT SHORT TERM GOAL #1   Title Independent with initial HEP    Baseline Patient inconsistent with hep due to pain level    Time 3    Period Weeks    Status Achieved    Target Date 08/13/20      PT SHORT TERM GOAL #2   Title Return to sleeping in bed/no further need to sleep in recliner    Baseline Patient continues to sleep in recliner    Time 3    Period Weeks    Status Achieved    Target Date 08/13/20             PT Long Term Goals - 09/03/20 1553      PT LONG TERM GOAL #1   Title Independent with advanced HEP for continued progress after d/c from therapy    Baseline ongoing for updates    Time 6    Period Weeks    Status On-going    Target Date 10/15/20      PT LONG TERM GOAL #2   Title Improve FOTO outcome measure score to 44% or less impairment      Baseline Reported worsening in FOTO score from 57% to 64% limitation    Time 6    Period Weeks    Status On-going    Target Date 10/15/20      PT LONG TERM GOAL #3   Title Increase trunk flexion AROM at least 20 deg to improve ability for bending for chores and activities such as donning shoes    Baseline Exhibits improve forward bend but continues to be limited    Time 6    Period Weeks    Status On-going    Target Date 10/15/20      PT LONG TERM GOAL #4   Title Tolerate standing/ambulation for periods at least 20-30 min for activities such as grocery shopping with LBP/LLE pain 3/10 or less    Baseline still ongoing    Time 6    Period Weeks    Status On-going    Target Date 10/15/20      PT LONG TERM GOAL #5   Title Increase left LE strength at least grossly 1/2 MMT grade to improve ability for stair navigation and lifting for chores    Baseline Patient exhibits improve LE strength, see flowsheet    Time 6    Period Weeks    Status On-going    Target Date 10/15/20                 Plan - 10/06/20 1449    Clinical Impression Statement Fair status with therapy with continued moderate to high pain level and associated functional limitations. Pt. has made improvements with decreased distal left leg symptoms/some centralization from baseline status but continues with more local lumbar pain and functional limitations. Plan finish out PT this week then hold further therapy to await response to injections.    Personal Factors and Comorbidities Comorbidity 3+    Comorbidities diabetic, myasthenia gravis, HTN    Examination-Activity Limitations Stand;Lift;Squat;Sleep;Hygiene/Grooming;Dressing;Bathing;Sit;Transfers;Carry;Locomotion Level;Stairs;Bed Mobility    Examination-Participation Restrictions Cleaning;Laundry;Shop;Meal Prep;Driving;Community Activity    Stability/Clinical Decision Making Evolving/Moderate complexity    Clinical Decision Making Moderate  Rehab Potential  Fair    PT Frequency 2x / week    PT Duration 6 weeks    PT Treatment/Interventions ADLs/Self Care Home Management;Cryotherapy;Traction;Therapeutic exercise;Ultrasound;Electrical Stimulation;Moist Heat;Therapeutic activities;Functional mobility training;Neuromuscular re-education;Patient/family education;Manual techniques;Dry needling;Taping    PT Next Visit Plan Plan 1 more visit then await status for response from injections, update HEP as needed, continue performance/progression of flexion bias trunk ROM, stretches, gentle core, hip LAD/manual and modalities prn for pain    PT Home Exercise Plan [email or web address]    Consulted and Agree with Plan of Care Patient           Patient will benefit from skilled therapeutic intervention in order to improve the following deficits and impairments:  Pain, Postural dysfunction, Impaired flexibility, Decreased strength, Decreased activity tolerance, Decreased range of motion, Difficulty walking, Hypomobility, Increased muscle spasms  Visit Diagnosis: Radiculopathy, lumbar region  Muscle weakness (generalized)  Difficulty in walking, not elsewhere classified     Problem List Patient Active Problem List   Diagnosis Date Noted   Bradycardia 08/20/2020   Acute cervical radiculopathy 07/09/2020   Pruritus 02/06/2020   Raynaud's phenomenon 01/06/2020   Leg cramping 07/25/2018   Left wrist tendinitis 04/30/2018   Left-sided low back pain with sciatica 04/30/2018   Hand numbness 01/24/2018   Lumbar radiculopathy 12/23/2016   Plantar fasciitis of right foot 06/22/2016   Leg weakness, bilateral 06/22/2016   Cough 12/19/2014   Carpal tunnel syndrome of right wrist 03/07/2013   Myasthenia gravis without exacerbation (HCC) 12/31/2010   ALOPECIA 12/31/2010   Hypothyroidism 12/29/2010   Type 2 diabetes, diet controlled (HCC) 12/29/2010   Hyperlipidemia 12/29/2010   Essential hypertension 12/29/2010   Coronary atherosclerosis  12/29/2010   OSTEOARTHRITIS 12/29/2010    Lazarus Gowda, PT, DPT 10/06/20 3:02 PM  [facility] Outpatient Rehabilitation [facility] [address] [place], [place], [address] Phone: [phone]   Fax:  [phone]  Name: Gloria [name] MRN: [id number] Date of Birth: 03-Jun-1951

## 2020-10-08 ENCOUNTER — Ambulatory Visit: Payer: Medicare HMO

## 2020-10-08 ENCOUNTER — Ambulatory Visit: Payer: Medicare HMO | Admitting: Physical Therapy

## 2020-10-08 ENCOUNTER — Other Ambulatory Visit: Payer: Self-pay

## 2020-10-08 DIAGNOSIS — R262 Difficulty in walking, not elsewhere classified: Secondary | ICD-10-CM

## 2020-10-08 DIAGNOSIS — M6281 Muscle weakness (generalized): Secondary | ICD-10-CM | POA: Diagnosis not present

## 2020-10-08 DIAGNOSIS — M5416 Radiculopathy, lumbar region: Secondary | ICD-10-CM | POA: Diagnosis not present

## 2020-10-08 NOTE — Therapy (Signed)
[facility] Outpatient Rehabilitation [facility] [address] [place], [place], [address] Phone: [phone]   Fax:  [phone]  Physical Therapy Treatment  Patient Details  Name: Gloria [name] MRN: [id number] Date of Birth: 1951/03/28 Referring Provider (PT): Cheryll Cockayne, MD   Encounter Date: 10/08/2020   PT End of Session - 10/08/20 1048    Visit Number 17    Number of Visits 22    Date for PT Re-Evaluation 10/15/20    Authorization Type MCR-add KX    Authorization Time Period 07/23/20-08/28/20    Authorization - Visit Number 10    Authorization - Number of Visits 12    Progress Note Due on Visit 20    PT Start Time 1048    PT Stop Time 1135    PT Time Calculation (min) 47 min    Activity Tolerance Patient limited by pain    Behavior During Therapy [facility] for tasks assessed/performed           Past Medical History:  Diagnosis Date  . Alopecia areata 10/2010 onset  . Diabetes mellitus, type 2 (HCC)   . Dyslipidemia   . Hypertension   . Hypothyroid   . Myasthenia gravis     Past Surgical History:  Procedure Laterality Date  . ABDOMINAL HYSTERECTOMY  1990    There were no vitals filed for this visit.   Subjective Assessment - 10/08/20 1051    Subjective She reports she can walk better and feels better.    MRI dione. She will be getting injections middle of next month.    Pertinent History previous history lumbar radiculopathy, diabetic, myasthenia gravis    Pain Score 6     Pain Location Back    Pain Orientation Left;Lower    Pain Descriptors / Indicators Aching    Pain Radiating Towards Lt posterior hip    Pain Onset More than a month ago    Pain Frequency Constant                             [facility] Adult PT Treatment/Exercise - 10/08/20 0001      Lumbar Exercises: Stretches   Single Knee to Chest Stretch Limitations 10 reps 5-10 sec holds on left, x 5 reps on right limited due to pain    Lower Trunk Rotation 5  reps;10 seconds      Lumbar Exercises: Supine   Pelvic Tilt 15 reps    Clam 15 reps;3 seconds    Clam Limitations yellow band    Bent Knee Raise Limitations 12 reps alternating marches    Other Supine Lumbar Exercises hip adduction isometric with small ball squeeze and short PPT 3 sec holds x 15 reps    Other Supine Lumbar Exercises abd. bracing with alt. UE raises ( bench pres with moving over head then down) with 2 lb. weights x 12 reps bilat.      Cryotherapy   Number Minutes Cryotherapy 10 Minutes    Cryotherapy Location Lumbar Spine    Type of Cryotherapy Ice pack      Manual Therapy   Manual Traction LAD bilat. hips grade I-III focus left side                    PT Short Term Goals - 09/03/20 1552      PT SHORT TERM GOAL #1   Title Independent with initial HEP    Baseline Patient inconsistent with hep  due to pain level    Time 3    Period Weeks    Status Achieved    Target Date 08/13/20      PT SHORT TERM GOAL #2   Title Return to sleeping in bed/no further need to sleep in recliner    Baseline Patient continues to sleep in recliner    Time 3    Period Weeks    Status Achieved    Target Date 08/13/20             PT Long Term Goals - 10/08/20 1130      PT LONG TERM GOAL #1   Title Independent with advanced HEP for continued progress after d/c from therapy    Status On-going      PT LONG TERM GOAL #2   Title Improve FOTO outcome measure score to 44% or less impairment    Baseline Score improved to 53% limited but goal was  44% and  she is 4 visits past expected length of stay to meet target    Status On-going      PT LONG TERM GOAL #3   Title Increase trunk flexion AROM at least 20 deg to improve ability for bending for chores and activities such as donning shoes    Status Unable to assess      PT LONG TERM GOAL #4   Title Tolerate standing/ambulation for periods at least 20-30 min for activities such as grocery shopping with LBP/LLE pain 3/10  or less    Baseline still ongoing    Status On-going      PT LONG TERM GOAL #5   Title Increase left LE strength at least grossly 1/2 MMT grade to improve ability for stair navigation and lifting for chores    Status Unable to assess                 Plan - 10/08/20 1053    Clinical Impression Statement She is the same and will have injections in next 2-3 weeks. She will call to schedule with regular PT's and see how they can progress  with her. Isuggested she schedule 4 visits if MD wants her to return but she will need reauth for new visits    Examination-Activity Limitations Stand;Lift;Squat;Sleep;Hygiene/Grooming;Dressing;Bathing;Sit;Transfers;Carry;Locomotion Level;Stairs;Bed Mobility    PT Treatment/Interventions ADLs/Self Care Home Management;Cryotherapy;Traction;Therapeutic exercise;Ultrasound;Electrical Stimulation;Moist Heat;Therapeutic activities;Functional mobility training;Neuromuscular re-education;Patient/family education;Manual techniques;Dry needling;Taping    PT Next Visit Plan On hold until post injections  Re-eval /renewal needed.    PT Home Exercise Plan [id number]    Consulted and Agree with Plan of Care Patient           Patient will benefit from skilled therapeutic intervention in order to improve the following deficits and impairments:  Pain, Postural dysfunction, Impaired flexibility, Decreased strength, Decreased activity tolerance, Decreased range of motion, Difficulty walking, Hypomobility, Increased muscle spasms  Visit Diagnosis: Radiculopathy, lumbar region  Muscle weakness (generalized)  Difficulty in walking, not elsewhere classified     Problem List Patient Active Problem List   Diagnosis Date Noted  . Bradycardia 08/20/2020  . Acute cervical radiculopathy 07/09/2020  . Pruritus 02/06/2020  . Raynaud's phenomenon 01/06/2020  . Leg cramping 07/25/2018  . Left wrist tendinitis 04/30/2018  . Left-sided low back pain with sciatica  04/30/2018  . Hand numbness 01/24/2018  . Lumbar radiculopathy 12/23/2016  . Plantar fasciitis of right foot 06/22/2016  . Leg weakness, bilateral 06/22/2016  . Cough 12/19/2014  . Carpal tunnel syndrome of right  wrist 03/07/2013  . Myasthenia gravis without exacerbation (HCC) 12/31/2010  . ALOPECIA 12/31/2010  . Hypothyroidism 12/29/2010  . Type 2 diabetes, diet controlled (HCC) 12/29/2010  . Hyperlipidemia 12/29/2010  . Essential hypertension 12/29/2010  . Coronary atherosclerosis 12/29/2010  . OSTEOARTHRITIS 12/29/2010    Caprice Red  PT 10/08/2020, 12:35 PM  [facility] Outpatient Rehabilitation [facility] [address] [place], [place], [address] Phone: [phone]   Fax:  [phone]  Name: Masha Orbach MRN: [id number] Date of Birth: 1951-08-09

## 2020-10-12 DIAGNOSIS — Z03818 Encounter for observation for suspected exposure to other biological agents ruled out: Secondary | ICD-10-CM | POA: Diagnosis not present

## 2020-10-12 DIAGNOSIS — Z20822 Contact with and (suspected) exposure to covid-19: Secondary | ICD-10-CM | POA: Diagnosis not present

## 2020-10-12 DIAGNOSIS — Z1152 Encounter for screening for COVID-19: Secondary | ICD-10-CM | POA: Diagnosis not present

## 2020-10-25 ENCOUNTER — Other Ambulatory Visit: Payer: Self-pay | Admitting: Internal Medicine

## 2020-10-27 ENCOUNTER — Ambulatory Visit (INDEPENDENT_AMBULATORY_CARE_PROVIDER_SITE_OTHER): Payer: Medicare HMO | Admitting: Physical Medicine and Rehabilitation

## 2020-10-27 ENCOUNTER — Ambulatory Visit: Payer: Self-pay

## 2020-10-27 ENCOUNTER — Encounter: Payer: Self-pay | Admitting: Physical Medicine and Rehabilitation

## 2020-10-27 ENCOUNTER — Other Ambulatory Visit: Payer: Self-pay

## 2020-10-27 VITALS — BP 158/83 | HR 52

## 2020-10-27 DIAGNOSIS — M47816 Spondylosis without myelopathy or radiculopathy, lumbar region: Secondary | ICD-10-CM | POA: Diagnosis not present

## 2020-10-27 MED ORDER — METHYLPREDNISOLONE ACETATE 80 MG/ML IJ SUSP
80.0000 mg | Freq: Once | INTRAMUSCULAR | Status: AC
  Administered 2020-10-27: 80 mg

## 2020-10-27 NOTE — Progress Notes (Signed)
Pt state lower back pain that travel mostly on her left side. Pt state bending and walking for a long period of time makes the pain worse. Pt state she would lay down and takes pain meds to help ease the pain.  Numeric Pain Rating Scale and Functional Assessment Average Pain 6   In the last MONTH (on 0-10 scale) has pain interfered with the following?  1. General activity like being  able to carry out your everyday physical activities such as walking, climbing stairs, carrying groceries, or moving a chair?  Rating(9)   +Driver, -BT, -Dye Allergies.

## 2020-11-02 NOTE — Progress Notes (Signed)
Gloria [name] - 68 y.o. female MRN [id number]  Date of birth: 1951/09/30  Office Visit Note: Visit Date: 10/27/2020 PCP: Pincus Sanes, MD Referred by: Pincus Sanes, MD  Subjective: Chief Complaint  Patient presents with  . Lower Back - Pain   HPI:  Gloria [name] is a 69 y.o. female who comes in today at the request of Dr. Doneen Poisson for planned Left  L4-L5 Lumbar facet/medial branch block with fluoroscopic guidance.  The patient has failed conservative care including home exercise, medications, time and activity modification.  This injection will be diagnostic and hopefully therapeutic.  Please see requesting physician notes for further details and justification.  Exam has shown concordant pain with facet joint loading.  She describes 6 out of 10 lower back pain on the left worse with bending and standing and walking.  No radicular pain.  No focal weakness.  Does get some relief at times with pain medication but cannot take opioid medications.  Case complicated by myasthenia gravis as well as type 2 diabetes.  MRI reviewed with images and spine model.  MRI reviewed in the note below.   ROS Otherwise per HPI.  Assessment & Plan: Visit Diagnoses:  1. Spondylosis without myelopathy or radiculopathy, lumbar region     Plan: No additional findings.   Meds & Orders:  Meds ordered this encounter  Medications  . methylPREDNISolone acetate (DEPO-MEDROL) injection 80 mg    Orders Placed This Encounter  Procedures  . Facet Injection  . XR C-ARM NO REPORT    Follow-up: Return if symptoms worsen or fail to improve.   Procedures: No procedures performed  Lumbar Facet Joint Intra-Articular Injection(s) with Fluoroscopic Guidance  Patient: Gloria [name]      Date of Birth: [birth year] MRN: [id number] PCP: Pincus Sanes, MD      Visit Date: 10/27/2020   Universal Protocol:    Date/Time: 10/27/2020  Consent Given By: the patient  Position: PRONE    Additional Comments: Vital signs were monitored before and after the procedure. Patient was prepped and draped in the usual sterile fashion. The correct patient, procedure, and site was verified.   Injection Procedure Details:  Procedure Site One Meds Administered:  Meds ordered this encounter  Medications  . methylPREDNISolone acetate (DEPO-MEDROL) injection 80 mg     Laterality: Left  Location/Site:  L4-L5  Needle size: 22 guage  Needle type: Spinal  Needle Placement: Articular  Findings:  -Comments: Excellent flow of contrast producing a partial arthrogram.  Procedure Details: The fluoroscope beam is vertically oriented in AP, and the inferior recess is visualized beneath the lower pole of the inferior apophyseal process, which represents the target point for needle insertion. When direct visualization is difficult the target point is located at the medial projection of the vertebral pedicle. The region overlying each aforementioned target is locally anesthetized with a 1 to 2 ml. volume of 1% Lidocaine without Epinephrine.   The spinal needle was inserted into each of the above mentioned facet joints using biplanar fluoroscopic guidance. A 0.25 to 0.5 ml. volume of Isovue-250 was injected and a partial facet joint arthrogram was obtained. A single spot film was obtained of the resulting arthrogram.    One to 1.25 ml of the steroid/anesthetic solution was then injected into each of the facet joints noted above.   Additional Comments:  The patient tolerated the procedure well Dressing: 2 x 2 sterile gauze and Band-Aid    Post-procedure details: Patient was  observed during the procedure. Post-procedure instructions were reviewed.  Patient left the clinic in stable condition.     Clinical History: MRI LUMBAR SPINE WITHOUT CONTRAST  TECHNIQUE: Multiplanar, multisequence MR imaging of the lumbar spine was performed. No intravenous contrast was  administered.  COMPARISON:  Radiography 04/30/2018  FINDINGS: Segmentation:  5 lumbar type vertebrae  Alignment:  Grade 1 anterolisthesis at L4-5  Vertebrae: Marrow edema about the left L4-5 facet. No acute fracture, discitis, or aggressive bone lesion  Conus medullaris and cauda equina: Conus extends to the L2 level. Conus and cauda equina appear normal.  Paraspinal and other soft tissues: Negative  Disc levels:  T12- L1: Unremarkable.  L1-L2: Disc narrowing and bulging. Mild facet spurring. Mild bilateral foraminal narrowing  L2-L3: Unremarkable.  L3-L4: Mild disc narrowing and bulging. Degenerative facet spurring and ligamentum flavum thickening. No neural impingement  L4-L5: Facet osteoarthritis which is advanced with bulky bony and ligamentous hypertrophy, anterolisthesis, and active facet arthritis on the left. The disc is narrowed and bulging and there is advanced spinal stenosis and biforaminal L4 root flattening.  L5-S1:Disc narrowing and bulging with endplate and facet spurring. High-grade Biforaminal impingement. Patent spinal canal.  IMPRESSION: 1. 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 Combined with disc bulging there is compressive spinal and biforaminal stenosis. 2. L5-S1 degenerative biforaminal impingement. 3. L1-2 noncompressive disc bulging.   Electronically Signed   By: Marnee Spring M.D.   On: 09/16/2020 09:36     Objective:  VS:  HT:    WT:   BMI:     BP:(!) 158/83  HR:(!) 52bpm  TEMP: ( )  RESP:  Physical Exam Constitutional:      General: She is not in acute distress.    Appearance: Normal appearance. She is not ill-appearing.  HENT:     Head: Normocephalic and atraumatic.     Right Ear: External ear normal.     Left Ear: External ear normal.  Eyes:     Extraocular Movements: Extraocular movements intact.  Cardiovascular:     Rate and Rhythm: Normal rate.      Pulses: Normal pulses.  Musculoskeletal:     Right lower leg: No edema.     Left lower leg: No edema.     Comments: Patient has good distal strength with no pain over the greater trochanters.  No clonus or focal weakness. Patient somewhat slow to rise from a seated position to full extension.  There is concordant low back pain with facet loading and lumbar spine extension rotation.  There are no definitive trigger points but the patient is somewhat tender across the lower back and PSIS.  There is no pain with hip rotation.  Skin:    Findings: No erythema, lesion or rash.  Neurological:     General: No focal deficit present.     Mental Status: She is alert and oriented to person, place, and time.     Sensory: No sensory deficit.     Motor: No weakness or abnormal muscle tone.     Coordination: Coordination normal.  Psychiatric:        Mood and Affect: Mood normal.        Behavior: Behavior normal.      Imaging: No results found.

## 2020-11-02 NOTE — Procedures (Signed)
Lumbar Facet Joint Intra-Articular Injection(s) with Fluoroscopic Guidance  Patient: Gloria [name]      Date of Birth: [birth year] MRN: [id number] PCP: Pincus Sanes, MD      Visit Date: 10/27/2020   Universal Protocol:    Date/Time: 10/27/2020  Consent Given By: the patient  Position: PRONE   Additional Comments: Vital signs were monitored before and after the procedure. Patient was prepped and draped in the usual sterile fashion. The correct patient, procedure, and site was verified.   Injection Procedure Details:  Procedure Site One Meds Administered:  Meds ordered this encounter  Medications  . methylPREDNISolone acetate (DEPO-MEDROL) injection 80 mg     Laterality: Left  Location/Site:  L4-L5  Needle size: 22 guage  Needle type: Spinal  Needle Placement: Articular  Findings:  -Comments: Excellent flow of contrast producing a partial arthrogram.  Procedure Details: The fluoroscope beam is vertically oriented in AP, and the inferior recess is visualized beneath the lower pole of the inferior apophyseal process, which represents the target point for needle insertion. When direct visualization is difficult the target point is located at the medial projection of the vertebral pedicle. The region overlying each aforementioned target is locally anesthetized with a 1 to 2 ml. volume of 1% Lidocaine without Epinephrine.   The spinal needle was inserted into each of the above mentioned facet joints using biplanar fluoroscopic guidance. A 0.25 to 0.5 ml. volume of Isovue-250 was injected and a partial facet joint arthrogram was obtained. A single spot film was obtained of the resulting arthrogram.    One to 1.25 ml of the steroid/anesthetic solution was then injected into each of the facet joints noted above.   Additional Comments:  The patient tolerated the procedure well Dressing: 2 x 2 sterile gauze and Band-Aid    Post-procedure details: Patient was observed  during the procedure. Post-procedure instructions were reviewed.  Patient left the clinic in stable condition.

## 2020-11-11 ENCOUNTER — Other Ambulatory Visit: Payer: Self-pay | Admitting: Internal Medicine

## 2020-11-20 ENCOUNTER — Other Ambulatory Visit: Payer: Self-pay | Admitting: Internal Medicine

## 2020-11-20 DIAGNOSIS — Z1231 Encounter for screening mammogram for malignant neoplasm of breast: Secondary | ICD-10-CM

## 2020-11-20 NOTE — Therapy (Signed)
[place], [place], [address] Phone: [phone]   Fax:  [phone]  Physical Therapy Treatment/Discharge  Patient Details  Name: Gloria [name] MRN: [id number] Date of Birth: 02-09-1951 Referring Provider (PT): Billey Gosling, MD   Encounter Date: 10/08/2020    Past Medical History:  Diagnosis Date  . Alopecia areata 10/2010 onset  . Diabetes mellitus, type 2 ([place])   . Dyslipidemia   . Hypertension   . Hypothyroid   . Myasthenia gravis     Past Surgical History:  Procedure Laterality Date  . ABDOMINAL HYSTERECTOMY  1990    There were no vitals filed for this visit.                                PT Short Term Goals - 09/03/20 1552      PT SHORT TERM GOAL #1   Title Independent with initial HEP    Baseline Patient inconsistent with hep due to pain level    Time 3    Period Weeks    Status Achieved    Target Date 08/13/20      PT SHORT TERM GOAL #2   Title Return to sleeping in bed/no further need to sleep in recliner    Baseline Patient continues to sleep in recliner    Time 3    Period Weeks    Status Achieved    Target Date 08/13/20             PT Long Term Goals - 10/08/20 1130      PT LONG TERM GOAL #1   Title Independent with advanced HEP for continued progress after d/c from therapy    Status On-going      PT LONG TERM GOAL #2   Title Improve FOTO outcome measure score to 44% or less impairment    Baseline Score improved to 53% limited but goal was  44% and  she is 4 visits past expected length of stay to meet target    Status On-going      PT LONG TERM GOAL #3   Title Increase trunk flexion AROM at least 20 deg to improve ability for bending for chores and activities such as donning shoes    Status Unable to assess      PT LONG TERM GOAL #4   Title Tolerate standing/ambulation for periods at least 20-30 min for activities such as  grocery shopping with LBP/LLE pain 3/10 or less    Baseline still ongoing    Status On-going      PT LONG TERM GOAL #5   Title Increase left LE strength at least grossly 1/2 MMT grade to improve ability for stair navigation and lifting for chores    Status Unable to assess                  Patient will benefit from skilled therapeutic intervention in order to improve the following deficits and impairments:  Pain,Postural dysfunction,Impaired flexibility,Decreased strength,Decreased activity tolerance,Decreased range of motion,Difficulty walking,Hypomobility,Increased muscle spasms  Visit Diagnosis: Radiculopathy, lumbar region  Muscle weakness (generalized)  Difficulty in walking, not elsewhere classified     Problem List Patient Active Problem List   Diagnosis Date Noted  . Bradycardia 08/20/2020  . Acute cervical radiculopathy 07/09/2020  . Pruritus 02/06/2020  . Raynaud's phenomenon 01/06/2020  . Leg cramping 07/25/2018  . Left wrist tendinitis 04/30/2018  . Left-sided  low back pain with sciatica 04/30/2018  . Hand numbness 01/24/2018  . Lumbar radiculopathy 12/23/2016  . Plantar fasciitis of right foot 06/22/2016  . Leg weakness, bilateral 06/22/2016  . Cough 12/19/2014  . Carpal tunnel syndrome of right wrist 03/07/2013  . Myasthenia gravis without exacerbation ([facility]) 12/31/2010  . ALOPECIA 12/31/2010  . Hypothyroidism 12/29/2010  . Type 2 diabetes, diet controlled ([facility]) 12/29/2010  . Hyperlipidemia 12/29/2010  . Essential hypertension 12/29/2010  . Coronary atherosclerosis 12/29/2010  . OSTEOARTHRITIS 12/29/2010       PHYSICAL THERAPY DISCHARGE SUMMARY  Visits from Start of Care: 17  Current functional level related to goals / functional outcomes: Patient did not return for further therapy after last session 10/08/20-she was following up with MD for further treatment options including injections. No further PT planned at this time.   Remaining  deficits: Unable to update current status/no further PT after 10/08/20   Education / Equipment: HEP Plan: Patient agrees to discharge.  Patient goals were not met. Patient is being discharged due to not returning since the last visit.  ?????           Beaulah Dinning, PT, DPT 11/20/20 9:03 AM       [facility] [address] [place], [place], [address] Phone: [phone]   Fax:  [phone]  Name: Gloria [name] MRN: [id number] Date of Birth: Nov 02, 1951

## 2020-12-02 ENCOUNTER — Telehealth: Payer: Self-pay | Admitting: Internal Medicine

## 2020-12-02 NOTE — Progress Notes (Signed)
  Chronic Care Management   Outreach Note  12/02/2020 Name: Gloria [name] MRN: [id number] DOB: [birth year]  Referred by: Pincus Sanes, MD Reason for referral : No chief complaint on file.   An unsuccessful telephone outreach was attempted today. The patient was referred to the pharmacist for assistance with care management and care coordination.   Follow Up Plan:   Carley Perdue [employer] Scheduler

## 2020-12-07 ENCOUNTER — Telehealth: Payer: Self-pay | Admitting: Internal Medicine

## 2020-12-07 NOTE — Progress Notes (Signed)
  Chronic Care Management   Note  12/07/2020 Name: Gloria [name] MRN: [id number] DOB: September 09, 1951  Gloria [name] is a 69 y.o. year old female who is a primary care patient of Burns, Bobette Mo, MD. I reached out to Heath Lark Vanderweele by phone today in response to a referral sent by [name]'s PCP, Lawerance Bach, Bobette Mo, MD.   [name] was given information about Chronic Care Management services today including:  1. CCM service includes personalized support from designated clinical staff supervised by her physician, including individualized plan of care and coordination with other care providers 2. 24/7 contact phone numbers for assistance for urgent and routine care needs. 3. Service will only be billed when office clinical staff spend 20 minutes or more in a month to coordinate care. 4. Only one practitioner may furnish and bill the service in a calendar month. 5. The patient may stop CCM services at any time (effective at the end of the month) by phone call to the office staff.   Patient agreed to services and verbal consent obtained.   Follow up plan:   Carley Perdue [employer] Scheduler

## 2020-12-09 ENCOUNTER — Other Ambulatory Visit: Payer: Self-pay | Admitting: Internal Medicine

## 2020-12-21 ENCOUNTER — Ambulatory Visit: Payer: Medicare HMO

## 2020-12-21 NOTE — Progress Notes (Signed)
Subjective:    Patient ID: Gloria [name], female    DOB: Apr 30, 1951, 70 y.o.   MRN: [id number]  HPI The patient is here for an acute visit.   Left hip and leg pain -   It started the beginning of last week.  She has this several times in the past.  In the past she did PT and had a shot and they both worked great.  She went to work out at the gym and did leg lifts and the next morning she had left leg pain.  She has left butt pain to the ankle.    No N/T, no weakness, no changes in bowel/bladder.   She started taking the gabapentin 300 mg, but can only take it at night.  It has helped.  She is still having the pain.     She takes the meloxicam daily.  Her pain can reach an 8/10.    Medications and allergies reviewed with patient and updated if appropriate.  Patient Active Problem List   Diagnosis Date Noted  . Bradycardia 08/20/2020  . Acute cervical radiculopathy 07/09/2020  . Pruritus 02/06/2020  . Raynaud's phenomenon 01/06/2020  . Leg cramping 07/25/2018  . Left wrist tendinitis 04/30/2018  . Left-sided low back pain with sciatica 04/30/2018  . Hand numbness 01/24/2018  . Lumbar radiculopathy 12/23/2016  . Plantar fasciitis of right foot 06/22/2016  . Leg weakness, bilateral 06/22/2016  . Cough 12/19/2014  . Carpal tunnel syndrome of right wrist 03/07/2013  . Myasthenia gravis without exacerbation ([facility]) 12/31/2010  . ALOPECIA 12/31/2010  . Hypothyroidism 12/29/2010  . Type 2 diabetes, diet controlled ([facility]) 12/29/2010  . Hyperlipidemia 12/29/2010  . Essential hypertension 12/29/2010  . Coronary atherosclerosis 12/29/2010  . OSTEOARTHRITIS 12/29/2010    Current Outpatient Medications on File Prior to Visit  Medication Sig Dispense Refill  . amLODipine (NORVASC) 5 MG tablet TAKE 1 TABLET(5 MG) BY MOUTH DAILY 90 tablet 1  . atorvastatin (LIPITOR) 10 MG tablet TAKE 1 TABLET BY MOUTH DAILY AT 6 PM 90 tablet 0  . diazepam (VALIUM) 5 MG tablet Take 1 tablet (5 mg  total) by mouth 2 (two) times daily as needed for anxiety. Take 1 tablet 30 minutes before your procedure.  Repeat once if needed. 2 tablet 0  . diclofenac sodium (VOLTAREN) 1 % GEL Apply 4 g topically 4 (four) times daily. 100 g 5  . gabapentin (NEURONTIN) 300 MG capsule TAKE 1 CAPSULE(300 MG) BY MOUTH THREE TIMES DAILY 90 capsule 2  . levothyroxine (SYNTHROID) 100 MCG tablet TAKE 1 TABLET BY MOUTH DAILY 90 tablet 0  . losartan (COZAAR) 100 MG tablet TAKE 1 TABLET(100 MG) BY MOUTH DAILY 90 tablet 1  . meloxicam (MOBIC) 15 MG tablet TAKE 1 TABLET(15 MG) BY MOUTH DAILY 90 tablet 1  . pyridostigmine (MESTINON) 60 MG tablet Take 60 mg by mouth 3 (three) times daily. Per Neurologist    . ST JOSEPH ASPIRIN PO Take 81 mg by mouth daily.    Marland Kitchen triamcinolone cream (KENALOG) 0.1 % APPLY EXTERNALLY TO THE AFFECTED AREA TWICE DAILY 30 g 0   No current facility-administered medications on file prior to visit.    Past Medical History:  Diagnosis Date  . Alopecia areata 10/2010 onset  . Diabetes mellitus, type 2 ([facility])   . Dyslipidemia   . Hypertension   . Hypothyroid   . Myasthenia gravis     Past Surgical History:  Procedure Laterality Date  . ABDOMINAL HYSTERECTOMY  1990    Social History   Socioeconomic History  . Marital status: Divorced    Spouse name: Not on file  . Number of children: 3  . Years of education: Not on file  . Highest education level: Not on file  Occupational History  . Occupation: retired  Tobacco Use  . Smoking status: Never Smoker  . Smokeless tobacco: Never Used  Vaping Use  . Vaping Use: Never used  Substance and Sexual Activity  . Alcohol use: No  . Drug use: Never  . Sexual activity: Never  Other Topics Concern  . Not on file  Social History Narrative  . Not on file   Social Determinants of Health   Financial Resource Strain: Low Risk   . Difficulty of Paying Living Expenses: Not hard at all  Food Insecurity: No Food Insecurity  . Worried About  Programme researcher, broadcasting/film/video in the Last Year: Never true  . Ran Out of Food in the Last Year: Never true  Transportation Needs: No Transportation Needs  . Lack of Transportation (Medical): No  . Lack of Transportation (Non-Medical): No  Physical Activity: Inactive  . Days of Exercise per Week: 0 days  . Minutes of Exercise per Session: 0 min  Stress: No Stress Concern Present  . Feeling of Stress : Only a little  Social Connections: Not on file    Family History  Adopted: Yes  Problem Relation Age of Onset  . Breast cancer Neg Hx     Review of Systems Per HPI    Objective:   Vitals:   12/22/20 1122  BP: 124/70  Pulse: (!) 50  Temp: 97.9 F (36.6 C)  SpO2: 99%   BP Readings from Last 3 Encounters:  12/22/20 124/70  10/27/20 (!) 158/83  08/20/20 128/72   Wt Readings from Last 3 Encounters:  12/22/20 167 lb (75.8 kg)  08/20/20 170 lb 6.4 oz (77.3 kg)  07/22/20 164 lb (74.4 kg)   Body mass index is 26.95 kg/m.   Physical Exam Constitutional:      General: She is not in acute distress.    Appearance: Normal appearance. She is not ill-appearing.  HENT:     Head: Normocephalic and atraumatic.  Musculoskeletal:        General: Tenderness (Slight tenderness lumbar spine to left buttock region) present.  Skin:    General: Skin is warm and dry.  Neurological:     General: No focal deficit present.     Mental Status: She is alert.     Sensory: No sensory deficit.     Gait: Gait normal.            Assessment & Plan:    See Problem List for Assessment and Plan of chronic medical problems.    This visit occurred during the SARS-CoV-2 public health emergency.  Safety protocols were in place, including screening questions prior to the visit, additional usage of staff PPE, and extensive cleaning of exam room while observing appropriate contact time as indicated for disinfecting solutions.

## 2020-12-22 ENCOUNTER — Ambulatory Visit (INDEPENDENT_AMBULATORY_CARE_PROVIDER_SITE_OTHER): Payer: Medicare HMO | Admitting: Internal Medicine

## 2020-12-22 ENCOUNTER — Encounter: Payer: Self-pay | Admitting: Internal Medicine

## 2020-12-22 ENCOUNTER — Other Ambulatory Visit: Payer: Self-pay

## 2020-12-22 VITALS — BP 124/70 | HR 50 | Temp 97.9°F | Ht 66.0 in | Wt 167.0 lb

## 2020-12-22 DIAGNOSIS — M5416 Radiculopathy, lumbar region: Secondary | ICD-10-CM | POA: Diagnosis not present

## 2020-12-22 MED ORDER — METHYLPREDNISOLONE ACETATE 80 MG/ML IJ SUSP
80.0000 mg | Freq: Once | INTRAMUSCULAR | Status: AC
  Administered 2020-12-22: 80 mg via INTRAMUSCULAR

## 2020-12-22 NOTE — Patient Instructions (Addendum)
  You received a steroid injection today.       A referral was ordered for physical therapy.     Someone from their office will call you to schedule an appointment.    Please call if there is no improvement in your symptoms.

## 2020-12-22 NOTE — Assessment & Plan Note (Signed)
Acute Has had several episodes in the past Last year did physical therapy and had an injection and she was pain-free for a while until she went back to the gym and this reaggravated her symptoms Started last week-lower back pain with radiculopathy, no concerning symptoms Depo-Medrol 80 mg IM x1 today Continue gabapentin 300 mg 1-2 times a day Continue meloxicam 15 mg daily Referred to PT  Call if no improvement

## 2021-01-12 ENCOUNTER — Other Ambulatory Visit: Payer: Self-pay

## 2021-01-12 ENCOUNTER — Ambulatory Visit: Payer: Medicare HMO | Attending: Internal Medicine | Admitting: Physical Therapy

## 2021-01-12 DIAGNOSIS — M6281 Muscle weakness (generalized): Secondary | ICD-10-CM | POA: Diagnosis not present

## 2021-01-12 DIAGNOSIS — R252 Cramp and spasm: Secondary | ICD-10-CM

## 2021-01-12 DIAGNOSIS — R262 Difficulty in walking, not elsewhere classified: Secondary | ICD-10-CM

## 2021-01-12 DIAGNOSIS — M5416 Radiculopathy, lumbar region: Secondary | ICD-10-CM | POA: Diagnosis not present

## 2021-01-12 NOTE — Therapy (Addendum)
[facility] [address] [place], [place], [address] Phone: [phone]   Fax:  [phone]  Physical Therapy Evaluation  Patient Details  Name: Stephenie AcresCynthia Lowery Schweiger MRN: [id number] Date of Birth: July 21, 1951 Referring Provider (PT): Cheryll CockayneBurns ,Stacy MD   Encounter Date: 01/12/2021   PT End of Session - 01/12/21 1246    Visit Number 1    Number of Visits 12    Date for PT Re-Evaluation 02/23/21    Authorization Type [employer] MCR    PT Start Time 1145    PT Stop Time 1233    PT Time Calculation (min) 48 min    Activity Tolerance Patient limited by pain    Behavior During Therapy Anxious           Past Medical History:  Diagnosis Date  . Alopecia areata 10/2010 onset  . Diabetes mellitus, type 2 (HCC)   . Dyslipidemia   . Hypertension   . Hypothyroid   . Myasthenia gravis     Past Surgical History:  Procedure Laterality Date  . ABDOMINAL HYSTERECTOMY  1990    There were no vitals filed for this visit.    Subjective Assessment - 01/12/21 1151    Subjective I normally exercise about 3 days a week [facility] [employer] and I was doing my exercises on a leg machine/leg press and I pushed wrong and hurt my lower back about 2-3 weeks ago.  I went to the doctor.  I have recieved a cortisone shot before I even hurt my back. I have been in this clinic before for low back pain last fall and before.    Pertinent History DM, HTN hyperlipidemia, Myasthenia Gravis, previous hx of lumbar radiculopathy  See med history    How long can you sit comfortably? 25 minutes    How long can you stand comfortably? 15 minuttes    How long can you walk comfortably? 10 minutes and then need a break    Diagnostic tests MRI 10/01/20. L4-5 severe facet osteoarthritis with anterolisthesis and  left-sided marrow edema. Combined with disc bulging there is  compressive spinal and biforaminal stenosis.  2. L5-S1 degenerative biforaminal impingement.  3. L1-2  noncompressive disc bulging.    Patient Stated Goals Get back to the gym pain free    Currently in Pain? Yes    Pain Score 7     Pain Location Back    Pain Orientation Left;Lower    Pain Descriptors / Indicators Aching    Pain Type Acute pain   acute pain /chronic back problem   Pain Onset 1 to 4 weeks ago    Pain Frequency Intermittent    Aggravating Factors  If I sit to long without support    Multiple Pain Sites Yes              [facility] - 01/12/21 0001      Assessment   Medical Diagnosis Lumbar radiculopathy    Referring Provider (PT) Cheryll CockayneBurns ,Stacy MD    Onset Date/Surgical Date 12/28/20   aproximately 2-3 weeks   Hand Dominance Right    Next MD Visit nothing scheduled    Prior Therapy in clinic for back in October 2021      Precautions   Precautions None      Restrictions   Weight Bearing Restrictions No      Balance Screen   Has the patient fallen in the past 6 months No    Has the patient  had a decrease in activity level because of a fear of falling?  No    Is the patient reluctant to leave their home because of a fear of falling?  No      Home Environment   Living Environment Private residence    Living Arrangements Alone    Type of Home House    Home Access Level entry    Home Layout Two level    Alternate Level Stairs-Number of Steps 13      Prior Function   Level of Independence Independent      Cognition   Overall Cognitive Status Within Functional Limits for tasks assessed      Observation/Other Assessments   Focus on Therapeutic Outcomes (FOTO)  FOTO Intake 28% predicted 48%      ROM / Strength   AROM / PROM / Strength AROM;Strength      AROM   Overall AROM  Deficits    Lumbar Flexion 40    Lumbar Extension 0   tends to lean away from left side  and flexed forward   Lumbar - Right Side Bend 10    Lumbar - Left Side Bend 15    Lumbar - Right Rotation 25%    Lumbar - Left Rotation 50%      Strength   Overall Strength Deficits     Right Hip Flexion 4/5    Right Hip Extension 4-/5    Right Hip External Rotation  4/5    Right Hip Internal Rotation 4+/5    Right Hip ABduction 3/5    Left Hip Flexion 4-/5    Left Hip Extension 3-/5    Left Hip External Rotation 4/5    Left Hip Internal Rotation 4/5    Left Hip ABduction 3-/5    Right Knee Flexion 5/5    Right Knee Extension 5/5    Left Knee Flexion 4+/5    Left Knee Extension 4-/5    Right Ankle Dorsiflexion 4+/5    Left Ankle Dorsiflexion 4-/5      Flexibility   Hamstrings Left SLR limited to 25 degrees due to pain   R 60 degrees      Palpation   Palpation comment tenderness over Left Qaudratus lumborum and spasms over bil lumbar paraspinals and Left gluteals      Special Tests    Special Tests Lumbar      Straight Leg Raise   Findings Positive    Side  Left    Comment 25 degrees      Ambulation/Gait   Gait Comments Pt ambulates slowly wihtout AD with forward flexed gait but has RW at home. Pt able to walk with decreased pain after RX and exercises in clinic                      Objective measurements completed on examination: See above findings.       [facility] Adult PT Treatment/Exercise - 01/12/21 0001      Transfers   Transfers Sit to Stand    Five time sit to stand comments  unable to test due to pain in low back      Ambulation/Gait   Gait Pattern Antalgic;Lateral trunk lean to right;Trunk flexed;Decreased hip/knee flexion - left    Ambulation Surface Level      Posture/Postural Control   Posture/Postural Control Postural limitations    Postural Limitations Flexed trunk    Posture Comments Pt must constantly shift in chair due to  discomfort especiall to left side and trying to unweight left buttock with R side lean elevated Left pelvic level.      Modalities   Modalities Moist Heat      Moist Heat Therapy   Number Minutes Moist Heat 15 Minutes   concurrent with exericise   Moist Heat Location Lumbar Spine                   PT Education - 01/12/21 1216    Education Details POC  Explanation of findings  intial HEP  introduction of Heat for relaxing spasming  muscles as  shown in clinic. Went over [employer] report at [employer]) Educated Patient    Methods Explanation;Demonstration;Tactile cues;Verbal cues;Handout    Comprehension Verbalized understanding;Returned demonstration            PT Short Term Goals - 01/12/21 1247      PT SHORT TERM GOAL #1   Title Independent with intial HEP    Baseline Pt with difficulty moving due to pain but responds to heat with exercise    Time 3    Period Weeks    Status New    Target Date 02/02/21      PT SHORT TERM GOAL #2   Title Pt unable to sleep without pain with any movement    Baseline pt unable to sleep greater than 2-3 hours at a time    Time 3    Period Weeks    Status New    Target Date 02/02/21      PT SHORT TERM GOAL #3   Title Pt unable to dress self without increased pain ( putting on shoes and socks)    Baseline pt needs extra time and assistance with shoes    Time 3    Period Weeks    Status New    Target Date 02/02/21             PT Long Term Goals - 01/12/21 1229      PT LONG TERM GOAL #1   Title Independent with advanced HEP as given including proper use of gym equimpment    Time 6    Period Weeks    Status New    Target Date 02/23/21      PT LONG TERM GOAL #2   Title Improve FOTO intake from 28% to at least 48%    Baseline eval intake 28%    Time 6    Period Weeks    Status New    Target Date 02/23/21      PT LONG TERM GOAL #3   Title Increase trunk flexion AROM at least 20 deg to improve ability for bending for chores and activities such as donning shoes    Baseline Pt unable to bed forward without increase pain/ difficulty donning clothing    Time 6    Period Weeks    Status New    Target Date 02/23/21      PT LONG TERM GOAL #4   Title Pt will be able to stand and lift items overhead to top  shelf in kitchen without exacerbating back pain    Baseline unable to lift items due to back pain    Time 6    Period Weeks    Status New    Target Date 02/23/21      PT LONG TERM GOAL #5   Title Pt will improve L/R hip ext and hip abd / knee  flex/ext  strength to >/= 4+/5 with </= without exacerbation of back pain to promote correct use of gym equipment and promote injurty prevention    Baseline See flowsheet for MMT    Time 6    Period Weeks    Status New      Additional Long Term Goals   Additional Long Term Goals Yes      PT LONG TERM GOAL #6   Title Pt will be able to demo lifting groceries from floor to show proper technique and practice injury prevention with household chores    Time 6    Period Weeks    Status New    Target Date 02/23/21                  Plan - 01/12/21 1257    Clinical Impression Statement Ms Sienkiewicz has been a pt at clinic before for low back issues.  Pt presents with recent exacerbation and Left low back spasm due to working out on leg press machine in gym.  Pt with immediate pain and spasm in low back.  Pt presented to MD office and recommended course of PT. pt with acute on chronic LBP with left radicular pain.  Limited tolerance today with mobility and sitting iwth R trunk flexion and lean during evaluaion.  Pt with  pain in all movement but unable to come into extension.  Pt is unable to sit for greater than 5 minutes without support. Pt recieved relief with moist hot pack and gentle flexion, SKTC exercise and trunk rotation. . Pt with + SLR test.  Pt also with elevated L pelvic level and palpation of left paraspinals in spasm over Quadratus Lumborum.  Pt would benefit from skilled PT 2 x a week for 6 weeks to address impairments and strengthen and educate onproper use of gym equipment for injury prevention.    Personal Factors and Comorbidities Comorbidity 1    Comorbidities DM, HTN hyperlipidemia, Myasthenia Gravis  See med history     Examination-Activity Limitations Bend;Carry;Dressing;Lift;Locomotion Level;Sit;Squat;Stairs;Stand;Transfers    Examination-Participation Restrictions Cleaning;Driving    Stability/Clinical Decision Making Evolving/Moderate complexity    Clinical Decision Making Moderate    Rehab Potential Good    PT Frequency 2x / week    PT Duration 6 weeks    PT Treatment/Interventions ADLs/Self Care Home Management;Aquatic Therapy;Cryotherapy;Electrical Stimulation;Moist Heat;Traction;Ultrasound;Gait training;Stair training;Functional mobility training;Therapeutic activities;Therapeutic exercise;Balance training;Neuromuscular re-education;Manual techniques;Patient/family education;Passive range of motion;Dry needling;Taping;Joint Manipulations;Spinal Manipulations    PT Next Visit Plan Manual  progress exercises HEP    PT Home Exercise Plan [email or web address]    Consulted and Agree with Plan of Care Patient           Patient will benefit from skilled therapeutic intervention in order to improve the following deficits and impairments:  Difficulty walking,Decreased mobility,Decreased range of motion,Decreased strength,Hypomobility,Increased muscle spasms,Postural dysfunction,Improper body mechanics,Impaired flexibility,Pain  Visit Diagnosis: Radiculopathy, lumbar region - Plan: PT plan of care cert/re-cert  Muscle weakness (generalized) - Plan: PT plan of care cert/re-cert  Difficulty in walking, not elsewhere classified - Plan: PT plan of care cert/re-cert  Cramp and spasm - Plan: PT plan of care cert/re-cert   Access Code: [email or web address]: [email or web address]: 02/01/2022Prepared by: Wayland Denis BeardsleyExercises  Supine Posterior Pelvic Tilt - 2-3 x daily - 7 x weekly - 2 sets - 10 reps - 5 seconds hold  Lower Trunk Rotations - 2-3 x daily - 7 x weekly - 1-2 sets - 10 reps - 20 seconds hold  Hooklying Single Knee  to Chest Stretch - 2-3 x daily - 7 x weekly - 1-2 sets - 10 reps - 10 seconds hold   Seated Lumbar Flexion Stretch - 5 x daily - 10 reps    Problem List Patient Active Problem List   Diagnosis Date Noted  . Bradycardia 08/20/2020  . Acute cervical radiculopathy 07/09/2020  . Pruritus 02/06/2020  . Raynaud's phenomenon 01/06/2020  . Leg cramping 07/25/2018  . Left wrist tendinitis 04/30/2018  . Left-sided low back pain with sciatica 04/30/2018  . Hand numbness 01/24/2018  . Lumbar radiculopathy 12/23/2016  . Plantar fasciitis of right foot 06/22/2016  . Cough 12/19/2014  . Carpal tunnel syndrome of right wrist 03/07/2013  . Myasthenia gravis without exacerbation (HCC) 12/31/2010  . ALOPECIA 12/31/2010  . Hypothyroidism 12/29/2010  . Type 2 diabetes, diet controlled (HCC) 12/29/2010  . Hyperlipidemia 12/29/2010  . Essential hypertension 12/29/2010  . Coronary atherosclerosis 12/29/2010  . OSTEOARTHRITIS 12/29/2010   Garen Lah, PT, ATRIC Certified Exercise Expert for the Aging Adult  01/12/21 2:14 PM Phone: [phone] Fax: [phone]  [facility] [address] [place], [place], [address] Phone: [phone]   Fax:  [phone]  Name: Anajulia Leyendecker MRN: [id number] Date of Birth: December 28, 1950   Referring diagnosis? Lumbar Radiculopathy Treatment diagnosis? (if different than referring diagnosis)  What was this (referring dx) caused by? []  Surgery []  Fall []  Ongoing issue [x]  Arthritis [x]  Other: ___injury at gym_________  Laterality: []  Rt [x]  Lt []  Both  Check all possible CPT codes:      []  97110 (Therapeutic Exercise)  []  (SLP Treatment)  []  97112 (Neuro Re-ed)   []  92526 (Swallowing Treatment)   []  97116 (Gait Training)   []  (Cognitive Training, 1st 15 minutes) []  97140 (Manual Therapy)   []  97130 (Cognitive Training, each add'l 15 minutes)  []  97530 (Therapeutic Activities)  []  Other, List CPT Code ____________    []  97535 (Self Care)       [x]  All codes above  (97110 - 97535)  [x]  97012 (Mechanical Traction)  [x]  97014 (E-stim Unattended)  [x]  97032 (E-stim manual)  []  97033 (Ionto)  [x]  97035 (Ultrasound)  []  97760 (Orthotic Fit) []  (Physical Performance Training) [x]  (Aquatic Therapy) []  97034 (Contrast Bath) []  (Paraffin) []  97597 (Wound Care 1st 20 sq cm) []  97598 (Wound Care each add'l 20 sq cm) []  97016 (Vasopneumatic Device) []  (Orthotic Training) []  (Prosthetic Training)

## 2021-01-12 NOTE — Patient Instructions (Signed)
    Heat Therapy Heat therapy can help make painful, stiff muscles and joints feel better. Do not use heat on new injuries. Wait at least 48 hours after an injury to use heat. Do not use heat when you have aches or pains right after an activity. If you still have pain 3 hours after stopping the activity, then you may use heat. HOME CARE Wet heat pack  Soak a clean towel in warm water. Squeeze out the extra water.  Put the warm, wet towel in a plastic bag.  Place a thin, dry towel between your skin and the bag.  Put the heat pack on the area for 5 minutes, and check your skin. Your skin may be pink, but it should not be red.  Leave the heat pack on the area for 15 to 30 minutes.  Repeat this every 2 to 4 hours while awake. Do not use heat while you are sleeping. Warm water bath  Fill a tub with warm water.  Place the affected body part in the tub.  Soak the area for 20 to 40 minutes.  Repeat as needed. Hot water bottle  Fill the water bottle half full with hot water.  Press out the extra air. Close the cap tightly.  Place a dry towel between your skin and the bottle.heat  Put the bottle on the area for 5 minutes, and check your skin. Your skin may be pink, but it should not be red.  Leave the bottle on the area for 15 to 30 minutes.  Repeat this every 2 to 4 hours while awake. Electric heating pad  Place a dry towel between your skin and the heating pad.  Set the heating pad on low heat.  Put the heating pad on the area for 10 minutes, and check your skin. Your skin may be pink, but it should not be red.  Leave the heating pad on the area for 20 to 40 minutes.  Repeat this every 2 to 4 hours while awake.  Do not lie on the heating pad.  Do not fall asleep while using the heating pad.  Do not use the heating pad near water. GET HELP RIGHT AWAY IF:  You get blisters or red skin.  Your skin is puffy (swollen), or you lose feeling (numbness) in the affected  area.  You have any new problems.  Your problems are getting worse.  You have any questions or concerns. If you have any problems, stop using heat therapy until you see your doctor. MAKE SURE YOU:  Understand these instructions.  Will watch your condition.  Will get help right away if you are not doing well or get worse. Document Released: 02/20/2012 Document Reviewed: 01/21/2014 [facility] Patient Information 2015 [place], [place]. This information is not intended to replace advice given to you by your health care provider. Make sure you discuss any questions you have with your health care provider.    Garen Lah, PT, ATRIC Certified Exercise Expert for the Aging Adult  01/12/21 12:16 PM Phone: [phone] Fax: [phone]

## 2021-01-13 ENCOUNTER — Ambulatory Visit: Payer: Medicare HMO | Admitting: Physical Therapy

## 2021-01-13 ENCOUNTER — Encounter: Payer: Self-pay | Admitting: Physical Therapy

## 2021-01-13 DIAGNOSIS — M5416 Radiculopathy, lumbar region: Secondary | ICD-10-CM

## 2021-01-13 DIAGNOSIS — M6281 Muscle weakness (generalized): Secondary | ICD-10-CM | POA: Diagnosis not present

## 2021-01-13 DIAGNOSIS — R252 Cramp and spasm: Secondary | ICD-10-CM

## 2021-01-13 DIAGNOSIS — R262 Difficulty in walking, not elsewhere classified: Secondary | ICD-10-CM | POA: Diagnosis not present

## 2021-01-13 NOTE — Patient Instructions (Signed)
Leg Lengthener: Full    Straighten one leg. Pull toes AND forefoot toward knee, extend heel. Lengthen leg by pulling pelvis away from ribs. Hold _5__ seconds. Relax. Repeat 5-10 times. Re-bend knee. Do other leg. Can reach same arm over head at same time.

## 2021-01-13 NOTE — Therapy (Signed)
[facility] Outpatient Rehabilitation [facility] [address] [place], [place], [address] Phone: [phone]   Fax:  [phone]  Physical Therapy Treatment  Patient Details  Name: Gloria [name] MRN: [id number] Date of Birth: 07/29/1951 Referring Provider (PT): Cheryll Cockayne MD   Encounter Date: 01/13/2021   PT End of Session - 01/13/21 1504    Visit Number 2    Number of Visits 12    Date for PT Re-Evaluation 02/23/21    Authorization Type [employer] MCR    PT Start Time 1504    PT Stop Time 1546    PT Time Calculation (min) 42 min    Activity Tolerance Patient limited by pain    Behavior During Therapy WNL           Past Medical History:  Diagnosis Date  . Alopecia areata 10/2010 onset  . Diabetes mellitus, type 2 (HCC)   . Dyslipidemia   . Hypertension   . Hypothyroid   . Myasthenia gravis     Past Surgical History:  Procedure Laterality Date  . ABDOMINAL HYSTERECTOMY  1990    There were no vitals filed for this visit.   Subjective Assessment - 01/13/21 1504    Subjective No changes since eval yesterday.    Pertinent History DM, HTN hyperlipidemia, Myasthenia Gravis, previous hx of lumbar radiculopathy  See med history    Diagnostic tests MRI 10/01/20. L4-5 severe facet osteoarthritis with anterolisthesis and  left-sided marrow edema. Combined with disc bulging there is  compressive spinal and biforaminal stenosis.  2. L5-S1 degenerative biforaminal impingement.  3. L1-2 noncompressive disc bulging.    Currently in Pain? Yes    Pain Score 6     Pain Location Back    Pain Orientation Left;Lower    Pain Descriptors / Indicators Aching    Aggravating Factors  unexpected movements, bending over.    Pain Relieving Factors heat, ice, stretches              [facility] PT Assessment - 01/13/21 0001      Assessment   Medical Diagnosis Lumbar radiculopathy    Referring Provider (PT) Cheryll Cockayne MD    Onset Date/Surgical Date 12/28/20    aproximately 2-3 weeks   Hand Dominance Right    Next MD Visit nothing scheduled    Prior Therapy in clinic for back in October 2021            [facility] Adult PT Treatment/Exercise - 01/13/21 0001      Exercises   Exercises Lumbar      Lumbar Exercises: Stretches   Passive Hamstring Stretch Limitations unable to tolerate hooklying stretch with strap;  switched to nerve flossing in supine with hip/ knee 90/90 with knee flex/ext x 5 reps - limited tolerance due to increased pain.    Single Knee to Chest Stretch Right;1 rep    Single Knee to Chest Stretch Limitations unable to tolerate in hooklying    Lower Trunk Rotation 3 reps;10 seconds   arms in T   Standing Extension 2 reps;5 seconds    Piriformis Stretch Right;Left;1 rep;20 seconds    Figure 4 Stretch 20 seconds;2 reps   supine   Other Lumbar Stretch Exercise forward fold for lumbar flexion stretch (per HEP) x 5 reps    Other Lumbar Stretch Exercise leg/arm lengthener x 5 sec x 5 reps each side.      Lumbar Exercises: Aerobic   Nustep L4: legs only x 5 min for warm up; cues  to increase speed (moving at 25-30 spm)      Lumbar Exercises: Seated   Sit to Stand 5 reps   with ab set   Other Seated Lumbar Exercises pt instructed in sit to/from supine via sidelying to log roll.  Pt returned demo with cues x 3      Lumbar Exercises: Supine   Ab Set 5 reps;5 seconds   neutral spine   Pelvic Tilt 5 reps;5 seconds    Bent Knee Raise 5 reps;3 seconds   with ab set   Bridge 5 reps      Modalities   Modalities Moist Heat      Moist Heat Therapy   Number Minutes Moist Heat 15 Minutes   used during supine exercises.   Moist Heat Location Lumbar Spine           Education:  HEP  (VHI exercise of Leg lengthener) - handout given.  Updated current Medbridge exercises. Pt verbalized understanding and returned demo.          PT Short Term Goals - 01/12/21 1247      PT SHORT TERM GOAL #1   Title Independent with intial HEP     Baseline Pt with difficulty moving due to pain but responds to heat with exercise    Time 3    Period Weeks    Status New    Target Date 02/02/21      PT SHORT TERM GOAL #2   Title Pt unable to sleep without pain with any movement    Baseline pt unable to sleep greater than 2-3 hours at a time    Time 3    Period Weeks    Status New    Target Date 02/02/21      PT SHORT TERM GOAL #3   Title Pt unable to dress self without increased pain ( putting on shoes and socks)    Baseline pt needs extra time and assistance with shoes    Time 3    Period Weeks    Status New    Target Date 02/02/21             PT Long Term Goals - 01/12/21 1229      PT LONG TERM GOAL #1   Title Independent with advanced HEP as given including proper use of gym equimpment    Time 6    Period Weeks    Status New    Target Date 02/23/21      PT LONG TERM GOAL #2   Title Improve FOTO intake from 28% to at least 48%    Baseline eval intake 28%    Time 6    Period Weeks    Status New    Target Date 02/23/21      PT LONG TERM GOAL #3   Title Increase trunk flexion AROM at least 20 deg to improve ability for bending for chores and activities such as donning shoes    Baseline Pt unable to bed forward without increase pain/ difficulty donning clothing    Time 6    Period Weeks    Status New    Target Date 02/23/21      PT LONG TERM GOAL #4   Title Pt will be able to stand and lift items overhead to top shelf in kitchen without exacerbating back pain    Baseline unable to lift items due to back pain    Time 6    Period Weeks  Status New    Target Date 02/23/21      PT LONG TERM GOAL #5   Title Pt will improve L/R hip ext and hip abd / knee flex/ext  strength to >/= 4+/5 with </= without exacerbation of back pain to promote correct use of gym equipment and promote injurty prevention    Baseline See flowsheet for MMT    Time 6    Period Weeks    Status New      Additional Long Term Goals    Additional Long Term Goals Yes      PT LONG TERM GOAL #6   Title Pt will be able to demo lifting groceries from floor to show proper technique and practice injury prevention with household chores    Time 6    Period Weeks    Status New    Target Date 02/23/21                 Plan - 01/13/21 1549    Clinical Impression Statement Pt observed moving slowly and cautiously during session.  She was unable to tolerate knee to chest stretch and hamstring stretch, but all other exercises were tolerated without increase in pain.  Pt able to demonstrate good mechanics for supine to/from sit and sit to/from stand with less pain with cues on form and to engage core muscles.  Pt reported slight decrease in pain at end of session.  Goals are ongoing.    Personal Factors and Comorbidities Comorbidity 1    Comorbidities DM, HTN hyperlipidemia, Myasthenia Gravis  See med history    Examination-Activity Limitations Bend;Carry;Dressing;Lift;Locomotion Level;Sit;Squat;Stairs;Stand;Transfers    Examination-Participation Restrictions Cleaning;Driving    Stability/Clinical Decision Making Evolving/Moderate complexity    Rehab Potential Good    PT Frequency 2x / week    PT Duration 6 weeks    PT Treatment/Interventions ADLs/Self Care Home Management;Aquatic Therapy;Cryotherapy;Electrical Stimulation;Moist Heat;Traction;Ultrasound;Gait training;Stair training;Functional mobility training;Therapeutic activities;Therapeutic exercise;Balance training;Neuromuscular re-education;Manual techniques;Patient/family education;Passive range of motion;Dry needling;Taping;Joint Manipulations;Spinal Manipulations    PT Next Visit Plan Manual therapy as indicated; body mechanics review,; progress exercises HEP    PT Home Exercise Plan [email or web address]    Consulted and Agree with Plan of Care Patient           Patient will benefit from skilled therapeutic intervention in order to improve the following deficits and impairments:   Difficulty walking,Decreased mobility,Decreased range of motion,Decreased strength,Hypomobility,Increased muscle spasms,Postural dysfunction,Improper body mechanics,Impaired flexibility,Pain  Visit Diagnosis: Radiculopathy, lumbar region  Muscle weakness (generalized)  Difficulty in walking, not elsewhere classified  Cramp and spasm     Problem List Patient Active Problem List   Diagnosis Date Noted  . Bradycardia 08/20/2020  . Acute cervical radiculopathy 07/09/2020  . Pruritus 02/06/2020  . Raynaud's phenomenon 01/06/2020  . Leg cramping 07/25/2018  . Left wrist tendinitis 04/30/2018  . Left-sided low back pain with sciatica 04/30/2018  . Hand numbness 01/24/2018  . Lumbar radiculopathy 12/23/2016  . Plantar fasciitis of right foot 06/22/2016  . Cough 12/19/2014  . Carpal tunnel syndrome of right wrist 03/07/2013  . Myasthenia gravis without exacerbation (HCC) 12/31/2010  . ALOPECIA 12/31/2010  . Hypothyroidism 12/29/2010  . Type 2 diabetes, diet controlled (HCC) 12/29/2010  . Hyperlipidemia 12/29/2010  . Essential hypertension 12/29/2010  . Coronary atherosclerosis 12/29/2010  . OSTEOARTHRITIS 12/29/2010   Mayer Camel, PTA 01/13/21 5:03 PM  [facility] Outpatient Rehabilitation [facility] [address] [place], [place], [address] Phone: [phone]   Fax:  [phone]  Name: Mavis  Honora Gwilt MRN: [id number] Date of Birth: 1951-01-27

## 2021-01-19 DIAGNOSIS — G7000 Myasthenia gravis without (acute) exacerbation: Secondary | ICD-10-CM | POA: Diagnosis not present

## 2021-01-19 DIAGNOSIS — I10 Essential (primary) hypertension: Secondary | ICD-10-CM | POA: Diagnosis not present

## 2021-01-19 DIAGNOSIS — Z008 Encounter for other general examination: Secondary | ICD-10-CM | POA: Diagnosis not present

## 2021-01-21 ENCOUNTER — Other Ambulatory Visit: Payer: Self-pay

## 2021-01-21 ENCOUNTER — Ambulatory Visit: Payer: Medicare HMO | Admitting: Physical Therapy

## 2021-01-21 ENCOUNTER — Encounter: Payer: Self-pay | Admitting: Physical Therapy

## 2021-01-21 DIAGNOSIS — R252 Cramp and spasm: Secondary | ICD-10-CM | POA: Diagnosis not present

## 2021-01-21 DIAGNOSIS — M6281 Muscle weakness (generalized): Secondary | ICD-10-CM | POA: Diagnosis not present

## 2021-01-21 DIAGNOSIS — M5416 Radiculopathy, lumbar region: Secondary | ICD-10-CM

## 2021-01-21 DIAGNOSIS — G7000 Myasthenia gravis without (acute) exacerbation: Secondary | ICD-10-CM | POA: Diagnosis not present

## 2021-01-21 DIAGNOSIS — Z79899 Other long term (current) drug therapy: Secondary | ICD-10-CM | POA: Diagnosis not present

## 2021-01-21 DIAGNOSIS — R262 Difficulty in walking, not elsewhere classified: Secondary | ICD-10-CM | POA: Diagnosis not present

## 2021-01-21 NOTE — Therapy (Addendum)
[facility] Outpatient Rehabilitation [facility] [address] [place], [place], [address] Phone: [phone]   Fax:  [phone]  Physical Therapy Treatment  Patient Details  Name: Gloria [name] MRN: [id number] Date of Birth: 03-Jul-1951 Referring Provider (PT): Cheryll Cockayne MD   Encounter Date: 01/21/2021   PT End of Session - 01/21/21 1049    Visit Number 3   Number of Visits 12    Date for PT Re-Evaluation 02/23/21    Authorization Type [employer] MCR    PT Start Time 1022    PT Stop Time 1111    PT Time Calculation (min) 49 min    Activity Tolerance Patient limited by pain    Behavior During Therapy [facility] for tasks assessed/performed           Past Medical History:  Diagnosis Date  . Alopecia areata 10/2010 onset  . Diabetes mellitus, type 2 (HCC)   . Dyslipidemia   . Hypertension   . Hypothyroid   . Myasthenia gravis     Past Surgical History:  Procedure Laterality Date  . ABDOMINAL HYSTERECTOMY  1990    There were no vitals filed for this visit.   Subjective Assessment - 01/21/21 1038    Subjective Pt. reports doing better today than at status last visit. Pain 5/10 this AM left lumbar and upper gluteal region.    Pertinent History DM, HTN hyperlipidemia, Myasthenia Gravis, previous hx of lumbar radiculopathy  See med history    How long can you sit comfortably? 25 minutes    How long can you stand comfortably? 15 minuttes    How long can you walk comfortably? 10 minutes and then need a break    Diagnostic tests MRI 10/01/20. L4-5 severe facet osteoarthritis with anterolisthesis and  left-sided marrow edema. Combined with disc bulging there is  compressive spinal and biforaminal stenosis.  2. L5-S1 degenerative biforaminal impingement.  3. L1-2 noncompressive disc bulging.    Patient Stated Goals Get back to the gym pain free    Currently in Pain? Yes    Pain Score 5     Pain Location Back    Pain Orientation Left;Lower    Pain Descriptors  / Indicators Aching    Pain Type Acute pain    Pain Onset More than a month ago    Pain Frequency Constant    Aggravating Factors  reaching, activity, prolonged sitting    Pain Relieving Factors heat    Effect of Pain on Daily Activities limits activity and positional tolerance                             [facility] Adult PT Treatment/Exercise - 01/21/21 0001      Lumbar Exercises: Stretches   Lower Trunk Rotation 5 reps    Lower Trunk Rotation Limitations 5-10 second holds      Lumbar Exercises: Aerobic   Nustep L4 x 5 min UE/LE      Lumbar Exercises: Supine   Pelvic Tilt 10 reps    Pelvic Tilt Limitations 3 sec holds    Bent Knee Raise 10 reps    Bent Knee Raise Limitations 2 sets of 5 reps ea. side    Bridge 10 reps    Bridge Limitations partial bridge 2 sets of 5    Other Supine Lumbar Exercises hip adduction isometric from hooklying 3 sec x 10 reps      Moist Heat Therapy   Number Minutes  Moist Heat 10 Minutes    Moist Heat Location Lumbar Spine   post-tx.     Manual Therapy   Manual Therapy Soft tissue mobilization    Soft tissue mobilization STM/IASTM left lumbar parapsinals and upper gluteal region including brief foam roll use left gluts                    PT Short Term Goals - 01/12/21 1247      PT SHORT TERM GOAL #1   Title Independent with intial HEP    Baseline Pt with difficulty moving due to pain but responds to heat with exercise    Time 3    Period Weeks    Status New    Target Date 02/02/21      PT SHORT TERM GOAL #2   Title Pt unable to sleep without pain with any movement    Baseline pt unable to sleep greater than 2-3 hours at a time    Time 3    Period Weeks    Status New    Target Date 02/02/21      PT SHORT TERM GOAL #3   Title Pt unable to dress self without increased pain ( putting on shoes and socks)    Baseline pt needs extra time and assistance with shoes    Time 3    Period Weeks    Status New    Target  Date 02/02/21             PT Long Term Goals - 01/12/21 1229      PT LONG TERM GOAL #1   Title Independent with advanced HEP as given including proper use of gym equimpment    Time 6    Period Weeks    Status New    Target Date 02/23/21      PT LONG TERM GOAL #2   Title Improve FOTO intake from 28% to at least 48%    Baseline eval intake 28%    Time 6    Period Weeks    Status New    Target Date 02/23/21      PT LONG TERM GOAL #3   Title Increase trunk flexion AROM at least 20 deg to improve ability for bending for chores and activities such as donning shoes    Baseline Pt unable to bed forward without increase pain/ difficulty donning clothing    Time 6    Period Weeks    Status New    Target Date 02/23/21      PT LONG TERM GOAL #4   Title Pt will be able to stand and lift items overhead to top shelf in kitchen without exacerbating back pain    Baseline unable to lift items due to back pain    Time 6    Period Weeks    Status New    Target Date 02/23/21      PT LONG TERM GOAL #5   Title Pt will improve L/R hip ext and hip abd / knee flex/ext  strength to >/= 4+/5 with </= without exacerbation of back pain to promote correct use of gym equipment and promote injurty prevention    Baseline See flowsheet for MMT    Time 6    Period Weeks    Status New      Additional Long Term Goals   Additional Long Term Goals Yes      PT LONG TERM GOAL #6   Title Pt  will be able to demo lifting groceries from floor to show proper technique and practice injury prevention with household chores    Time 6    Period Weeks    Status New    Target Date 02/23/21                 Plan - 01/21/21 1044    Clinical Impression Statement Still with LBP and associated functoinal limitations for mobility and positional tolerance but improving from status at eval and last therapy visit. Added gentle manual for STM left lumbar paraspinals and glut region with good tx. effect noted for  ease of pain. Therapy goals still ongoing for progress with status impacted by previous recent history LBP issues.    Personal Factors and Comorbidities Comorbidity 1    Comorbidities DM, HTN hyperlipidemia, Myasthenia Gravis  See med history    Examination-Activity Limitations Bend;Carry;Dressing;Lift;Locomotion Level;Sit;Squat;Stairs;Stand;Transfers    Examination-Participation Restrictions Cleaning;Driving    Stability/Clinical Decision Making Evolving/Moderate complexity    Clinical Decision Making Moderate    Rehab Potential Good    PT Frequency 2x / week    PT Duration 6 weeks    PT Treatment/Interventions ADLs/Self Care Home Management;Aquatic Therapy;Cryotherapy;Electrical Stimulation;Moist Heat;Traction;Ultrasound;Gait training;Stair training;Functional mobility training;Therapeutic activities;Therapeutic exercise;Balance training;Neuromuscular re-education;Manual techniques;Patient/family education;Passive range of motion;Dry needling;Taping;Joint Manipulations;Spinal Manipulations    PT Next Visit Plan Manual therapy as indicated; body mechanics review,; progress exercises HEP    PT Home Exercise Plan [email or web address]    Consulted and Agree with Plan of Care Patient           Patient will benefit from skilled therapeutic intervention in order to improve the following deficits and impairments:  Difficulty walking,Decreased mobility,Decreased range of motion,Decreased strength,Hypomobility,Increased muscle spasms,Postural dysfunction,Improper body mechanics,Impaired flexibility,Pain  Visit Diagnosis: Radiculopathy, lumbar region  Muscle weakness (generalized)  Difficulty in walking, not elsewhere classified  Cramp and spasm     Problem List Patient Active Problem List   Diagnosis Date Noted  . Bradycardia 08/20/2020  . Acute cervical radiculopathy 07/09/2020  . Pruritus 02/06/2020  . Raynaud's phenomenon 01/06/2020  . Leg cramping 07/25/2018  . Left wrist tendinitis  04/30/2018  . Left-sided low back pain with sciatica 04/30/2018  . Hand numbness 01/24/2018  . Lumbar radiculopathy 12/23/2016  . Plantar fasciitis of right foot 06/22/2016  . Cough 12/19/2014  . Carpal tunnel syndrome of right wrist 03/07/2013  . Myasthenia gravis without exacerbation (HCC) 12/31/2010  . ALOPECIA 12/31/2010  . Hypothyroidism 12/29/2010  . Type 2 diabetes, diet controlled (HCC) 12/29/2010  . Hyperlipidemia 12/29/2010  . Essential hypertension 12/29/2010  . Coronary atherosclerosis 12/29/2010  . OSTEOARTHRITIS 12/29/2010    Lazarus Gowda, PT, DPT 01/21/21 11:05 AM  [facility] [address] [place], [place], [address] Phone: [phone]   Fax:  [phone]  Name: Lynea Rollison MRN: [id number] Date of Birth: [birth year]

## 2021-01-22 ENCOUNTER — Other Ambulatory Visit: Payer: Self-pay | Admitting: Internal Medicine

## 2021-01-26 ENCOUNTER — Encounter: Payer: Self-pay | Admitting: Physical Therapy

## 2021-01-26 ENCOUNTER — Other Ambulatory Visit: Payer: Self-pay

## 2021-01-26 ENCOUNTER — Ambulatory Visit: Payer: Medicare HMO | Admitting: Physical Therapy

## 2021-01-26 DIAGNOSIS — R252 Cramp and spasm: Secondary | ICD-10-CM | POA: Diagnosis not present

## 2021-01-26 DIAGNOSIS — M5416 Radiculopathy, lumbar region: Secondary | ICD-10-CM | POA: Diagnosis not present

## 2021-01-26 DIAGNOSIS — M6281 Muscle weakness (generalized): Secondary | ICD-10-CM

## 2021-01-26 DIAGNOSIS — R262 Difficulty in walking, not elsewhere classified: Secondary | ICD-10-CM

## 2021-01-26 NOTE — Patient Instructions (Addendum)

## 2021-01-26 NOTE — Therapy (Signed)
[facility] [address] [place], [place], [address] Phone: [phone]   Fax:  [phone]  Physical Therapy Treatment  Patient Details  Name: Gloria [name] MRN: [id number] Date of Birth: 02/28/1951 Referring Provider (PT): Cheryll Cockayne MD   Encounter Date: 01/26/2021   PT End of Session - 01/26/21 1235    Visit Number 4    Number of Visits 12    Date for PT Re-Evaluation 02/23/21    Authorization Type [employer] MCR    PT Start Time 1147    PT Stop Time 1230    PT Time Calculation (min) 43 min    Activity Tolerance Patient limited by pain    Behavior During Therapy [facility] for tasks assessed/performed           Past Medical History:  Diagnosis Date  . Alopecia areata 10/2010 onset  . Diabetes mellitus, type 2 (HCC)   . Dyslipidemia   . Hypertension   . Hypothyroid   . Myasthenia gravis     Past Surgical History:  Procedure Laterality Date  . ABDOMINAL HYSTERECTOMY  1990    There were no vitals filed for this visit.   Subjective Assessment - 01/26/21 1157    Subjective Pt reports she is doing exercises every day  She is a 6/10 pain level in the left lumbar and upper gluteal region    Pertinent History DM, HTN hyperlipidemia, Myasthenia Gravis, previous hx of lumbar radiculopathy  See med history    Diagnostic tests MRI 10/01/20. L4-5 severe facet osteoarthritis with anterolisthesis and  left-sided marrow edema. Combined with disc bulging there is  compressive spinal and biforaminal stenosis.  2. L5-S1 degenerative biforaminal impingement.  3. L1-2 noncompressive disc bulging.    Patient Stated Goals Get back to the gym pain free    Currently in Pain? Yes    Pain Score 6     Pain Location Back    Pain Orientation Left;Lower    Pain Descriptors / Indicators Aching    Pain Type Acute pain                             [facility] Adult PT Treatment/Exercise - 01/26/21 0001      Self-Care    Self-Care Lifting;ADL's;Posture    ADL's handout and suggestions for household chores    Lifting demo lifting stool    Posture sitting and standing and sleeping posture to reduce pain      Lumbar Exercises: Stretches   Single Knee to Chest Stretch 30 seconds;1 rep;Left;Right    Single Knee to Chest Stretch Limitations able to tolerate today    Lower Trunk Rotation 5 reps    Lower Trunk Rotation Limitations 5-10 second holds    Standing Extension 10 reps    Standing Extension Limitations R and L takes extra time to complete    Piriformis Stretch Right;Left;1 rep;20 seconds    Figure 4 Stretch 20 seconds;2 reps   supine     Lumbar Exercises: Aerobic   Nustep L4 x 5 min UE/LE   very slow moving     Lumbar Exercises: Standing   Other Standing Lumbar Exercises standing hip ext R and L x 10 and hip abd x 10 R and L, marching 10 x R and L at counter    Other Standing Lumbar Exercises sit to stand at sink  10 x  with UE support      Lumbar Exercises:  Seated   Other Seated Lumbar Exercises pt instructed in sit to/from supine via sidelying to log roll.  Pt returned demo with cues x 2      Lumbar Exercises: Supine   Pelvic Tilt 10 reps    Pelvic Tilt Limitations 3 sec holds    Bridge 10 reps    Bridge Limitations partial bridge 2 sets of 5      Manual Therapy   Manual Therapy Soft tissue mobilization    Manual therapy comments LAD and AP hip mobs of bil hips,    Soft tissue mobilization STM/IASTM left lumbar parapsinals and upper gluteal region including brief foam roll use left gluts                  PT Education - 01/26/21 1212    Education Details added standing exercises to HEP   went over posture, lifting and ADL /pain management strategies with Household  chores    Person(s) Educated Patient    Methods Explanation;Demonstration;Tactile cues;Verbal cues;Handout    Comprehension Verbalized understanding;Returned demonstration            PT Short Term Goals - 01/12/21  1247      PT SHORT TERM GOAL #1   Title Independent with intial HEP    Baseline Pt with difficulty moving due to pain but responds to heat with exercise    Time 3    Period Weeks    Status New    Target Date 02/02/21      PT SHORT TERM GOAL #2   Title Pt unable to sleep without pain with any movement    Baseline pt unable to sleep greater than 2-3 hours at a time    Time 3    Period Weeks    Status New    Target Date 02/02/21      PT SHORT TERM GOAL #3   Title Pt unable to dress self without increased pain ( putting on shoes and socks)    Baseline pt needs extra time and assistance with shoes    Time 3    Period Weeks    Status New    Target Date 02/02/21             PT Long Term Goals - 01/12/21 1229      PT LONG TERM GOAL #1   Title Independent with advanced HEP as given including proper use of gym equimpment    Time 6    Period Weeks    Status New    Target Date 02/23/21      PT LONG TERM GOAL #2   Title Improve FOTO intake from 28% to at least 48%    Baseline eval intake 28%    Time 6    Period Weeks    Status New    Target Date 02/23/21      PT LONG TERM GOAL #3   Title Increase trunk flexion AROM at least 20 deg to improve ability for bending for chores and activities such as donning shoes    Baseline Pt unable to bed forward without increase pain/ difficulty donning clothing    Time 6    Period Weeks    Status New    Target Date 02/23/21      PT LONG TERM GOAL #4   Title Pt will be able to stand and lift items overhead to top shelf in kitchen without exacerbating back pain    Baseline unable to lift items due  to back pain    Time 6    Period Weeks    Status New    Target Date 02/23/21      PT LONG TERM GOAL #5   Title Pt will improve L/R hip ext and hip abd / knee flex/ext  strength to >/= 4+/5 with </= without exacerbation of back pain to promote correct use of gym equipment and promote injurty prevention    Baseline See flowsheet for MMT     Time 6    Period Weeks    Status New      Additional Long Term Goals   Additional Long Term Goals Yes      PT LONG TERM GOAL #6   Title Pt will be able to demo lifting groceries from floor to show proper technique and practice injury prevention with household chores    Time 6    Period Weeks    Status New    Target Date 02/23/21                 Plan - 01/26/21 1151    Clinical Impression Statement Pt enters clinic with 6/10 and exits with a 4/10 pain.  Pt is hesitant to move initially due to pain but was able to participate fully with exercise and RX. Pt was able to stand for standing hip marching , extension and abd as well as sit to stand exercises.  Pt needs encouragement to complete 10 reps of each. Pt was educated on pain management, ADL and household chore body mechanics that she could apply today.    Personal Factors and Comorbidities Comorbidity 1    Comorbidities DM, HTN hyperlipidemia, Myasthenia Gravis  See med history    Examination-Activity Limitations Bend;Carry;Dressing;Lift;Locomotion Level;Sit;Squat;Stairs;Stand;Transfers    Examination-Participation Restrictions Cleaning;Driving    PT Frequency 2x / week    PT Duration 6 weeks    PT Treatment/Interventions ADLs/Self Care Home Management;Aquatic Therapy;Cryotherapy;Electrical Stimulation;Moist Heat;Traction;Ultrasound;Gait training;Stair training;Functional mobility training;Therapeutic activities;Therapeutic exercise;Balance training;Neuromuscular re-education;Manual techniques;Patient/family education;Passive range of motion;Dry needling;Taping;Joint Manipulations;Spinal Manipulations    PT Next Visit Plan Manual therapy as indicated;; progress exercises HEP LOad sit to stand,  2 min walk test try to walk faster with arm swing    PT Home Exercise Plan [email or web address]    Consulted and Agree with Plan of Care Patient           Patient will benefit from skilled therapeutic intervention in order to improve the  following deficits and impairments:  Difficulty walking,Decreased mobility,Decreased range of motion,Decreased strength,Hypomobility,Increased muscle spasms,Postural dysfunction,Improper body mechanics,Impaired flexibility,Pain  Visit Diagnosis: Radiculopathy, lumbar region  Muscle weakness (generalized)  Difficulty in walking, not elsewhere classified  Cramp and spasm  Access Code: [email or web address]: [email or web address]: 02/15/2022Prepared by: Wayland DenisLawrie BeardsleyExercises   Standing March with Counter Support - 1 x daily - 7 x weekly - 3 sets - 10 reps  Sit to Stand with Counter Support - 1 x daily - 7 x weekly - 3 sets - 10 reps  Standing Hip Abduction with Counter Support - 1 x daily - 7 x weekly - 3 sets - 10 reps  Standing Hip Extension with Counter Support - 1 x daily - 7 x weekly - 3 sets - 10 reps    Problem List Patient Active Problem List   Diagnosis Date Noted  . Bradycardia 08/20/2020  . Acute cervical radiculopathy 07/09/2020  . Pruritus 02/06/2020  . Raynaud's phenomenon 01/06/2020  . Leg cramping 07/25/2018  . Left wrist tendinitis 04/30/2018  . Left-sided  low back pain with sciatica 04/30/2018  . Hand numbness 01/24/2018  . Lumbar radiculopathy 12/23/2016  . Plantar fasciitis of right foot 06/22/2016  . Cough 12/19/2014  . Carpal tunnel syndrome of right wrist 03/07/2013  . Myasthenia gravis without exacerbation (HCC) 12/31/2010  . ALOPECIA 12/31/2010  . Hypothyroidism 12/29/2010  . Type 2 diabetes, diet controlled (HCC) 12/29/2010  . Hyperlipidemia 12/29/2010  . Essential hypertension 12/29/2010  . Coronary atherosclerosis 12/29/2010  . OSTEOARTHRITIS 12/29/2010    Garen Lah, PT, ATRIC Certified Exercise Expert for the Aging Adult  01/26/21 12:43 PM Phone: [phone] Fax: [phone]  [facility] [address] [place], [place], [address] Phone: [phone]   Fax:   [phone]  Name: Gloria [name] MRN: [id number] Date of Birth: 06-Jan-1951

## 2021-01-28 ENCOUNTER — Encounter: Payer: Self-pay | Admitting: Physical Therapy

## 2021-01-28 ENCOUNTER — Ambulatory Visit
Admission: RE | Admit: 2021-01-28 | Discharge: 2021-01-28 | Disposition: A | Discharge status: Home / Self Care | Payer: Medicare HMO | Source: Ambulatory Visit | Attending: Internal Medicine | Admitting: Internal Medicine

## 2021-01-28 ENCOUNTER — Ambulatory Visit: Payer: Medicare HMO | Admitting: Physical Therapy

## 2021-01-28 ENCOUNTER — Other Ambulatory Visit: Payer: Self-pay

## 2021-01-28 DIAGNOSIS — R252 Cramp and spasm: Secondary | ICD-10-CM | POA: Diagnosis not present

## 2021-01-28 DIAGNOSIS — M5416 Radiculopathy, lumbar region: Secondary | ICD-10-CM | POA: Diagnosis not present

## 2021-01-28 DIAGNOSIS — M6281 Muscle weakness (generalized): Secondary | ICD-10-CM | POA: Diagnosis not present

## 2021-01-28 DIAGNOSIS — Z1231 Encounter for screening mammogram for malignant neoplasm of breast: Secondary | ICD-10-CM

## 2021-01-28 DIAGNOSIS — R262 Difficulty in walking, not elsewhere classified: Secondary | ICD-10-CM

## 2021-01-28 NOTE — Therapy (Signed)
[facility] [address] [place], [place], [address] Phone: [phone]   Fax:  [phone]  Physical Therapy Treatment  Patient Details  Name: Gloria [name] MRN: [id number] Date of Birth: [birth year] Referring Provider (PT): Cheryll Cockayne MD   Encounter Date: 01/28/2021   PT End of Session - 01/28/21 1406    Visit Number 5    Number of Visits 12    Date for PT Re-Evaluation 02/23/21    Authorization Type [employer] MCR    PT Start Time 1403    PT Stop Time 1457    PT Time Calculation (min) 54 min    Activity Tolerance Patient limited by pain    Behavior During Therapy [facility] for tasks assessed/performed           Past Medical History:  Diagnosis Date  . Alopecia areata 10/2010 onset  . Diabetes mellitus, type 2 (HCC)   . Dyslipidemia   . Hypertension   . Hypothyroid   . Myasthenia gravis     Past Surgical History:  Procedure Laterality Date  . ABDOMINAL HYSTERECTOMY  1990    There were no vitals filed for this visit.   Subjective Assessment - 01/28/21 1404    Subjective Pt states that the exercises help. Back pain is low back today and from middle to L side    Currently in Pain? Yes    Pain Score 6     Pain Location Back    Pain Orientation Left;Mid;Lower    Pain Descriptors / Indicators Nagging    Pain Type Acute pain    Pain Onset More than a month ago    Pain Frequency Constant                             [facility] Adult PT Treatment/Exercise - 01/28/21 0001      Lumbar Exercises: Stretches   Single Knee to Chest Stretch 5 reps;10 seconds    Lower Trunk Rotation 5 reps    Lower Trunk Rotation Limitations 5-10 second holds      Lumbar Exercises: Aerobic   Nustep L4 x 5 min UE/LE      Lumbar Exercises: Standing   Other Standing Lumbar Exercises standing hip ext R and L x 12 and hip abd x 12 R and L, marching 12 x R and L at counter    Other Standing Lumbar Exercises sit to stand at  sink 12 x  with UE support      Lumbar Exercises: Supine   Pelvic Tilt 10 reps    Pelvic Tilt Limitations 3 sec holds    Bent Knee Raise Limitations 2 sets of 5 reps ea. side    Bridge Limitations partial bridge 2 sets of 5      Moist Heat Therapy   Number Minutes Moist Heat 10 Minutes    Moist Heat Location Lumbar Spine      Manual Therapy   Manual Therapy Soft tissue mobilization    Manual therapy comments LAD bil hips    Soft tissue mobilization STM/IASTM left lumbar parapsinals and upper gluteal region including brief foam roll use left gluts                  PT Education - 01/28/21 1402    Education Details HEP    Person(s) Educated Patient    Methods Explanation;Handout    Comprehension Verbalized understanding;Need further instruction  PT Short Term Goals - 01/12/21 1247      PT SHORT TERM GOAL #1   Title Independent with intial HEP    Baseline Pt with difficulty moving due to pain but responds to heat with exercise    Time 3    Period Weeks    Status New    Target Date 02/02/21      PT SHORT TERM GOAL #2   Title Pt unable to sleep without pain with any movement    Baseline pt unable to sleep greater than 2-3 hours at a time    Time 3    Period Weeks    Status New    Target Date 02/02/21      PT SHORT TERM GOAL #3   Title Pt unable to dress self without increased pain ( putting on shoes and socks)    Baseline pt needs extra time and assistance with shoes    Time 3    Period Weeks    Status New    Target Date 02/02/21             PT Long Term Goals - 01/12/21 1229      PT LONG TERM GOAL #1   Title Independent with advanced HEP as given including proper use of gym equimpment    Time 6    Period Weeks    Status New    Target Date 02/23/21      PT LONG TERM GOAL #2   Title Improve FOTO intake from 28% to at least 48%    Baseline eval intake 28%    Time 6    Period Weeks    Status New    Target Date 02/23/21      PT LONG  TERM GOAL #3   Title Increase trunk flexion AROM at least 20 deg to improve ability for bending for chores and activities such as donning shoes    Baseline Pt unable to bed forward without increase pain/ difficulty donning clothing    Time 6    Period Weeks    Status New    Target Date 02/23/21      PT LONG TERM GOAL #4   Title Pt will be able to stand and lift items overhead to top shelf in kitchen without exacerbating back pain    Baseline unable to lift items due to back pain    Time 6    Period Weeks    Status New    Target Date 02/23/21      PT LONG TERM GOAL #5   Title Pt will improve L/R hip ext and hip abd / knee flex/ext  strength to >/= 4+/5 with </= without exacerbation of back pain to promote correct use of gym equipment and promote injurty prevention    Baseline See flowsheet for MMT    Time 6    Period Weeks    Status New      Additional Long Term Goals   Additional Long Term Goals Yes      PT LONG TERM GOAL #6   Title Pt will be able to demo lifting groceries from floor to show proper technique and practice injury prevention with household chores    Time 6    Period Weeks    Status New    Target Date 02/23/21                 Plan - 01/28/21 1406    Clinical Impression Statement Pt  slow to move with all exercises secondary to pain, but was able to tolerate, perform, and slightly progress all supine and standing exercises that was done last visit. Pt noted decrease of sx with manual this session. Session was ended with moist hot pack as pts back was bothering her. Pt continues to benefit from skilled therapy in order to further educate on pain management, body mechanics, increasing core and LE strength, as well as increasing LE ROM to help decrease pain and increase functional ability.    Comorbidities DM, HTN hyperlipidemia, Myasthenia Gravis  See med history    PT Treatment/Interventions ADLs/Self Care Home Management;Aquatic Therapy;Cryotherapy;Electrical  Stimulation;Moist Heat;Traction;Ultrasound;Gait training;Stair training;Functional mobility training;Therapeutic activities;Therapeutic exercise;Balance training;Neuromuscular re-education;Manual techniques;Patient/family education;Passive range of motion;Dry needling;Taping;Joint Manipulations;Spinal Manipulations    PT Next Visit Plan manual PRN, progress supine and standing strengthening exercises as well as stretching    PT Home Exercise Plan [email or web address]    Consulted and Agree with Plan of Care Patient           Patient will benefit from skilled therapeutic intervention in order to improve the following deficits and impairments:  Difficulty walking,Decreased mobility,Decreased range of motion,Decreased strength,Hypomobility,Increased muscle spasms,Postural dysfunction,Improper body mechanics,Impaired flexibility,Pain  Visit Diagnosis: Radiculopathy, lumbar region  Muscle weakness (generalized)  Difficulty in walking, not elsewhere classified  Cramp and spasm     Problem List Patient Active Problem List   Diagnosis Date Noted  . Bradycardia 08/20/2020  . Acute cervical radiculopathy 07/09/2020  . Pruritus 02/06/2020  . Raynaud's phenomenon 01/06/2020  . Leg cramping 07/25/2018  . Left wrist tendinitis 04/30/2018  . Left-sided low back pain with sciatica 04/30/2018  . Hand numbness 01/24/2018  . Lumbar radiculopathy 12/23/2016  . Plantar fasciitis of right foot 06/22/2016  . Cough 12/19/2014  . Carpal tunnel syndrome of right wrist 03/07/2013  . Myasthenia gravis without exacerbation (HCC) 12/31/2010  . ALOPECIA 12/31/2010  . Hypothyroidism 12/29/2010  . Type 2 diabetes, diet controlled (HCC) 12/29/2010  . Hyperlipidemia 12/29/2010  . Essential hypertension 12/29/2010  . Coronary atherosclerosis 12/29/2010  . OSTEOARTHRITIS 12/29/2010    Gloria [name] 01/28/2021, 5:25 PM  [facility] [address] [place], [place],  [address] Phone: [phone]   Fax:  [phone]  Name: Gloria [name] MRN: [id number] Date of Birth: [birth year]

## 2021-02-01 ENCOUNTER — Other Ambulatory Visit: Payer: Self-pay | Admitting: Internal Medicine

## 2021-02-02 ENCOUNTER — Encounter: Payer: Self-pay | Admitting: Physical Therapy

## 2021-02-02 ENCOUNTER — Ambulatory Visit: Payer: Medicare HMO | Admitting: Physical Therapy

## 2021-02-02 ENCOUNTER — Telehealth: Payer: Self-pay | Admitting: Pharmacist

## 2021-02-02 ENCOUNTER — Other Ambulatory Visit: Payer: Self-pay

## 2021-02-02 DIAGNOSIS — R252 Cramp and spasm: Secondary | ICD-10-CM

## 2021-02-02 DIAGNOSIS — M5416 Radiculopathy, lumbar region: Secondary | ICD-10-CM

## 2021-02-02 DIAGNOSIS — M6281 Muscle weakness (generalized): Secondary | ICD-10-CM

## 2021-02-02 DIAGNOSIS — R262 Difficulty in walking, not elsewhere classified: Secondary | ICD-10-CM | POA: Diagnosis not present

## 2021-02-02 NOTE — Therapy (Addendum)
[facility] [address] [place], [place], [address] Phone: [phone]   Fax:  [phone]  Physical Therapy Treatment  Patient Details  Name: Gloria [name] MRN: [id number] Date of Birth: [birth year] Referring Provider (PT): Cheryll Cockayne MD   Encounter Date: 02/02/2021   PT End of Session - 02/02/21 1211     Visit Number 6    Number of Visits 12    Date for PT Re-Evaluation 02/23/21    Authorization Type [employer] MCR    PT Start Time 1142   pt arrived late   PT Stop Time 1232    PT Time Calculation (min) 50 min    Activity Tolerance Patient limited by pain    Behavior During Therapy [facility] for tasks assessed/performed             Past Medical History:  Diagnosis Date   Alopecia areata 10/2010 onset   Diabetes mellitus, type 2 (HCC)    Dyslipidemia    Hypertension    Hypothyroid    Myasthenia gravis     Past Surgical History:  Procedure Laterality Date   ABDOMINAL HYSTERECTOMY  1990    There were no vitals filed for this visit.   Subjective Assessment - 02/02/21 1144     Subjective Pt states that her back is a little stiffer than usual, not sure if it is because of the weather. Painful all across the lower back.    Currently in Pain? Yes    Pain Score 7     Pain Location Back    Pain Orientation Lower;Right;Left    Pain Descriptors / Indicators Tightness                                               [facility] Adult PT Treatment/Exercise - 02/02/21 0001       Lumbar Exercises: Stretches   Single Knee to Chest Stretch 5 reps;10 seconds    Lower Trunk Rotation 5 reps    Lower Trunk Rotation Limitations 5-10 second holds      Lumbar Exercises: Aerobic   Nustep L5 x 4 min UE/LE      Lumbar Exercises: Standing   Other Standing Lumbar Exercises 3 way standing hip x10 each way    Other Standing Lumbar Exercises sit to stand at sink 10 x  with heavier reliance UE support this session       Lumbar Exercises: Seated   Other Seated Lumbar Exercises physio ball isometrics front and laterally x10      Lumbar Exercises: Supine   Pelvic Tilt 10 reps    Pelvic Tilt Limitations 3 sec holds    Bent Knee Raise Limitations 2 sets of 5 reps ea. side alternatiting    Bridge Limitations partial bridge x10      Moist Heat Therapy   Number Minutes Moist Heat 10 Minutes    Moist Heat Location Lumbar Spine      Manual Therapy   Manual therapy comments LAD bil hips                          PT Education - 02/02/21 1331     Education Details HEP    Person(s) Educated Patient    Methods Explanation;Demonstration    Comprehension Verbalized understanding;Need further instruction  PT Short Term Goals - 01/12/21 1247       PT SHORT TERM GOAL #1   Title Independent with intial HEP    Baseline Pt with difficulty moving due to pain but responds to heat with exercise    Time 3    Period Weeks    Status New    Target Date 02/02/21      PT SHORT TERM GOAL #2   Title Pt unable to sleep without pain with any movement    Baseline pt unable to sleep greater than 2-3 hours at a time    Time 3    Period Weeks    Status New    Target Date 02/02/21      PT SHORT TERM GOAL #3   Title Pt unable to dress self without increased pain ( putting on shoes and socks)    Baseline pt needs extra time and assistance with shoes    Time 3    Period Weeks    Status New    Target Date 02/02/21                PT Long Term Goals - 01/12/21 1229       PT LONG TERM GOAL #1   Title Independent with advanced HEP as given including proper use of gym equimpment    Time 6    Period Weeks    Status New    Target Date 02/23/21      PT LONG TERM GOAL #2   Title Improve FOTO intake from 28% to at least 48%    Baseline eval intake 28%    Time 6    Period Weeks    Status New    Target Date 02/23/21      PT LONG TERM GOAL #3   Title Increase trunk flexion AROM  at least 20 deg to improve ability for bending for chores and activities such as donning shoes    Baseline Pt unable to bed forward without increase pain/ difficulty donning clothing    Time 6    Period Weeks    Status New    Target Date 02/23/21      PT LONG TERM GOAL #4   Title Pt will be able to stand and lift items overhead to top shelf in kitchen without exacerbating back pain    Baseline unable to lift items due to back pain    Time 6    Period Weeks    Status New    Target Date 02/23/21      PT LONG TERM GOAL #5   Title Pt will improve L/R hip ext and hip abd / knee flex/ext  strength to >/= 4+/5 with </= without exacerbation of back pain to promote correct use of gym equipment and promote injurty prevention    Baseline See flowsheet for MMT    Time 6    Period Weeks    Status New      Additional Long Term Goals   Additional Long Term Goals Yes      PT LONG TERM GOAL #6   Title Pt will be able to demo lifting groceries from floor to show proper technique and practice injury prevention with household chores    Time 6    Period Weeks    Status New    Target Date 02/23/21  Plan - 02/02/21 1332     Clinical Impression Statement Pt tolerated tx this session with no adverse effects. Pt slow with movement this session secondary to LBP/stiffness. Physio ball 3 way isometrics implemented this session to help with sitting core stability with positive effects. Pt showed increased reliance on UE support and functional knee valgus during sit to stands this session. Quick to fatigue with 3 way hip during standing. Session ended with LAD and moist heat per pt request. Pt continues to benefit skilled PT in order to help increase core stability, lumbar ROM, and overall LE functional strength to increase standing functional ability and decrease pain.    PT Treatment/Interventions ADLs/Self Care Home Management;Aquatic Therapy;Cryotherapy;Electrical  Stimulation;Moist Heat;Traction;Ultrasound;Gait training;Stair training;Functional mobility training;Therapeutic activities;Therapeutic exercise;Balance training;Neuromuscular re-education;Manual techniques;Patient/family education;Passive range of motion;Dry needling;Taping;Joint Manipulations;Spinal Manipulations    PT Next Visit Plan manual PRN, progress physio ball isometrics, progress standing strengthening exercises    PT Home Exercise Plan [id number]    Consulted and Agree with Plan of Care Patient             Patient will benefit from skilled therapeutic intervention in order to improve the following deficits and impairments:  Difficulty walking,Decreased mobility,Decreased range of motion,Decreased strength,Hypomobility,Increased muscle spasms,Postural dysfunction,Improper body mechanics,Impaired flexibility,Pain  Visit Diagnosis: Radiculopathy, lumbar region  Muscle weakness (generalized)  Difficulty in walking, not elsewhere classified  Cramp and spasm      Problem List Patient Active Problem List   Diagnosis Date Noted   Bradycardia 08/20/2020   Acute cervical radiculopathy 07/09/2020   Pruritus 02/06/2020   Raynaud's phenomenon 01/06/2020   Leg cramping 07/25/2018   Left wrist tendinitis 04/30/2018   Left-sided low back pain with sciatica 04/30/2018   Hand numbness 01/24/2018   Lumbar radiculopathy 12/23/2016   Plantar fasciitis of right foot 06/22/2016   Cough 12/19/2014   Carpal tunnel syndrome of right wrist 03/07/2013   Myasthenia gravis without exacerbation (HCC) 12/31/2010   ALOPECIA 12/31/2010   Hypothyroidism 12/29/2010   Type 2 diabetes, diet controlled (HCC) 12/29/2010   Hyperlipidemia 12/29/2010   Essential hypertension 12/29/2010   Coronary atherosclerosis 12/29/2010   OSTEOARTHRITIS 12/29/2010    Jeri Cos, SPT 02/02/2021, 3:12 PM  [facility] [address] [place], [place],  [address] Phone: [phone]   Fax:  [phone]  Name: Adonis Ryther MRN: [id number] Date of Birth: [birth year]

## 2021-02-02 NOTE — Progress Notes (Signed)
Chronic Care Management Pharmacy Assistant   Name: Gloria [name]  MRN: [id number] DOB: May 02, 1951  Reason for Encounter: Initial Questions   PCP : Pincus Sanes, MD  Allergies:   Allergies  Allergen Reactions  . Vicodin [Hydrocodone-Acetaminophen]     Facial swelling    Medications: Outpatient Encounter Medications as of 02/02/2021  Medication Sig  . amLODipine (NORVASC) 5 MG tablet TAKE 1 TABLET(5 MG) BY MOUTH DAILY  . atorvastatin (LIPITOR) 10 MG tablet TAKE 1 TABLET BY MOUTH DAILY AT 6 PM  . diazepam (VALIUM) 5 MG tablet Take 1 tablet (5 mg total) by mouth 2 (two) times daily as needed for anxiety. Take 1 tablet 30 minutes before your procedure.  Repeat once if needed.  . diclofenac sodium (VOLTAREN) 1 % GEL Apply 4 g topically 4 (four) times daily.  Marland Kitchen gabapentin (NEURONTIN) 300 MG capsule TAKE 1 CAPSULE(300 MG) BY MOUTH THREE TIMES DAILY  . levothyroxine (SYNTHROID) 100 MCG tablet TAKE 1 TABLET BY MOUTH DAILY  . losartan (COZAAR) 100 MG tablet TAKE 1 TABLET(100 MG) BY MOUTH DAILY  . meloxicam (MOBIC) 15 MG tablet TAKE 1 TABLET(15 MG) BY MOUTH DAILY  . pyridostigmine (MESTINON) 60 MG tablet Take 60 mg by mouth 3 (three) times daily. Per Neurologist  . ST JOSEPH ASPIRIN PO Take 81 mg by mouth daily.  Marland Kitchen triamcinolone cream (KENALOG) 0.1 % APPLY EXTERNALLY TO THE AFFECTED AREA TWICE DAILY   No facility-administered encounter medications on file as of 02/02/2021.    Current Diagnosis: Patient Active Problem List   Diagnosis Date Noted  . Bradycardia 08/20/2020  . Acute cervical radiculopathy 07/09/2020  . Pruritus 02/06/2020  . Raynaud's phenomenon 01/06/2020  . Leg cramping 07/25/2018  . Left wrist tendinitis 04/30/2018  . Left-sided low back pain with sciatica 04/30/2018  . Hand numbness 01/24/2018  . Lumbar radiculopathy 12/23/2016  . Plantar fasciitis of right foot 06/22/2016  . Cough 12/19/2014  . Carpal tunnel syndrome of right wrist 03/07/2013  .  Myasthenia gravis without exacerbation (HCC) 12/31/2010  . ALOPECIA 12/31/2010  . Hypothyroidism 12/29/2010  . Type 2 diabetes, diet controlled (HCC) 12/29/2010  . Hyperlipidemia 12/29/2010  . Essential hypertension 12/29/2010  . Coronary atherosclerosis 12/29/2010  . OSTEOARTHRITIS 12/29/2010    Goals Addressed   None     Follow-Up:  Pharmacist Review    Have you seen any other providers since your last visit? The patient states that she saw her urologist about a week ago  Any changes in your medications or health? The patient states that there has been no changes in medication or health  Any side effects from any medications? The patient states that she has not had any side effects from medications  Do you have an symptoms or problems not managed by your medications? The patient states that she does not have any symptoms or problems not managed by medications  Any concerns about your health right now? The patient states that she is concerned that she is taking to much medication  Has your provider asked that you check blood pressure, blood sugar, or follow special diet at home? The patient states that she was checking her blood pressure at home and it was in the normal range but then cuff broke. She states that at the urologist office when they checked her blood pressure it was elevated in the 160s/90s she thinks  Do you get any type of exercise on a regular basis? The patient states that she does exercise  at the gym, walking and other activities  Can you think of a goal you would like to reach for your health? The patient states that her goal is to try and stay as healthy as she can  Do you have any problems getting your medications? The patient states that she does not have any problems with getting medication from the pharmacy  Is there anything that you would like to discuss during the appointment? The patient states that she would like to discuss coming off some of her  medications  Patient having phone visit, asked to have medications and supplements at appointment   Horald Pollen, Clinical Pharmacist Assistant [facility] [phone]   Time spent:

## 2021-02-04 ENCOUNTER — Other Ambulatory Visit: Payer: Self-pay

## 2021-02-04 ENCOUNTER — Encounter: Payer: Self-pay | Admitting: Physical Therapy

## 2021-02-04 ENCOUNTER — Ambulatory Visit: Payer: Medicare HMO | Admitting: Physical Therapy

## 2021-02-04 DIAGNOSIS — M5416 Radiculopathy, lumbar region: Secondary | ICD-10-CM | POA: Diagnosis not present

## 2021-02-04 DIAGNOSIS — R262 Difficulty in walking, not elsewhere classified: Secondary | ICD-10-CM

## 2021-02-04 DIAGNOSIS — R252 Cramp and spasm: Secondary | ICD-10-CM

## 2021-02-04 DIAGNOSIS — M6281 Muscle weakness (generalized): Secondary | ICD-10-CM | POA: Diagnosis not present

## 2021-02-04 NOTE — Therapy (Addendum)
[facility] [address] [place], [place], [address] Phone: [phone]   Fax:  [phone]  Physical Therapy Treatment  Patient Details  Name: Gloria [name] MRN: [id number] Date of Birth: 02-21-1951 Referring Provider (PT): Cheryll Cockayne MD   Encounter Date: 02/04/2021   PT End of Session - 02/04/21 1628     Visit Number 7    Number of Visits 12    Date for PT Re-Evaluation 02/23/21    Authorization Type [employer] MCR    PT Start Time 1623    PT Stop Time 1720    PT Time Calculation (min) 57 min    Activity Tolerance Patient limited by pain    Behavior During Therapy [facility] for tasks assessed/performed             Past Medical History:  Diagnosis Date   Alopecia areata 10/2010 onset   Diabetes mellitus, type 2 (HCC)    Dyslipidemia    Hypertension    Hypothyroid    Myasthenia gravis     Past Surgical History:  Procedure Laterality Date   ABDOMINAL HYSTERECTOMY  1990    There were no vitals filed for this visit.   Subjective Assessment - 02/04/21 1628     Subjective Pt states that her back is really bothering her today, thinks its because of the rain.    Currently in Pain? Yes    Pain Score 7     Pain Location Back   hip bilaterally too   Pain Descriptors / Indicators Nagging;Other (Comment)   aggravating that stays with me   Pain Type Acute pain    Pain Onset More than a month ago    Pain Frequency Constant                                               [facility] Adult PT Treatment/Exercise - 02/04/21 0001       Lumbar Exercises: Stretches   Single Knee to Chest Stretch --   10 reps 10 seconds   Lower Trunk Rotation 5 reps    Lower Trunk Rotation Limitations 5-10 second holds    Other Lumbar Stretch Exercise seated ball rollouts 3 ways x10      Lumbar Exercises: Aerobic   Nustep L5 x 5 min UE/LE      Lumbar Exercises: Standing   Row 5 reps;Other (comment);Both   2 sets    Theraband Level (Row) Level 1 (Yellow)    Row Limitations focus on core stabilization    Shoulder Extension 5 reps;Both   2 sets   Theraband Level (Shoulder Extension) Level 1 (Yellow)    Shoulder Extension Limitations focus on core stablization    Other Standing Lumbar Exercises ER 2x5 each side, focusing on core stablization      Lumbar Exercises: Seated   Other Seated Lumbar Exercises physio ball isometrics front and laterally x10      Lumbar Exercises: Supine   Pelvic Tilt 10 reps    Pelvic Tilt Limitations 3 sec holds    Bent Knee Raise 10 reps    Bent Knee Raise Limitations need pelvic tilts to prime, unable to do bent knee raises initially before just doing pelvic tilts    Bridge Limitations partial bridge x10      Moist Heat Therapy   Number Minutes Moist Heat 10 Minutes  Moist Heat Location Lumbar Spine      Manual Therapy   Manual therapy comments LAD bil hips                          PT Education - 02/04/21 1710     Education Details HEP    Person(s) Educated Patient    Methods Explanation;Demonstration    Comprehension Verbalized understanding;Need further instruction              PT Short Term Goals - 02/04/21 1712       PT SHORT TERM GOAL #1   Title Independent with intial HEP    Baseline Pt with difficulty moving due to pain but responds to heat with exercise    Status Achieved      PT SHORT TERM GOAL #2   Title Pt unable to sleep without pain with any movement    Baseline pt unable to sleep greater than 2-3 hours at a time    Time 3    Period Weeks    Status On-going    Target Date 02/02/21      PT SHORT TERM GOAL #3   Title Pt unable to dress self without increased pain ( putting on shoes and socks)    Baseline pt needs extra time and assistance with shoes    Time 3    Period Weeks    Status On-going    Target Date 02/02/21                PT Long Term Goals - 01/12/21 1229       PT LONG TERM GOAL #1   Title  Independent with advanced HEP as given including proper use of gym equimpment    Time 6    Period Weeks    Status New    Target Date 02/23/21      PT LONG TERM GOAL #2   Title Improve FOTO intake from 28% to at least 48%    Baseline eval intake 28%    Time 6    Period Weeks    Status New    Target Date 02/23/21      PT LONG TERM GOAL #3   Title Increase trunk flexion AROM at least 20 deg to improve ability for bending for chores and activities such as donning shoes    Baseline Pt unable to bed forward without increase pain/ difficulty donning clothing    Time 6    Period Weeks    Status New    Target Date 02/23/21      PT LONG TERM GOAL #4   Title Pt will be able to stand and lift items overhead to top shelf in kitchen without exacerbating back pain    Baseline unable to lift items due to back pain    Time 6    Period Weeks    Status New    Target Date 02/23/21      PT LONG TERM GOAL #5   Title Pt will improve L/R hip ext and hip abd / knee flex/ext  strength to >/= 4+/5 with </= without exacerbation of back pain to promote correct use of gym equipment and promote injurty prevention    Baseline See flowsheet for MMT    Time 6    Period Weeks    Status New      Additional Long Term Goals   Additional Long Term Goals Yes  PT LONG TERM GOAL #6   Title Pt will be able to demo lifting groceries from floor to show proper technique and practice injury prevention with household chores    Time 6    Period Weeks    Status New    Target Date 02/23/21                        Plan - 02/04/21 1715     Clinical Impression Statement Pt limited by pain this session so standing LE exercises not performed this session. Instead, session focused on core stablization in standing and supine, as well as stretching the lower back and posterior musculature. Standing theraband exercises that worked on postural control and core stablility while performing UE resisted movements were  tolerated well this session with limited repetitions. Pt responded well to LAD as well as moist heat at the end of the session. Continues to benefit from PT in order to increase functional LE and core strength, as well as lumbar/LE ROM, in order to decrease pain and increase function.    PT Treatment/Interventions ADLs/Self Care Home Management;Aquatic Therapy;Cryotherapy;Electrical Stimulation;Moist Heat;Traction;Ultrasound;Gait training;Stair training;Functional mobility training;Therapeutic activities;Therapeutic exercise;Balance training;Neuromuscular re-education;Manual techniques;Patient/family education;Passive range of motion;Dry needling;Taping;Joint Manipulations;Spinal Manipulations    PT Next Visit Plan standing postural theraband exercises, LE strengthening standing exercises, physio ball isometrics, manual PRN    PT Home Exercise Plan [email or web address]    Consulted and Agree with Plan of Care Patient             Patient will benefit from skilled therapeutic intervention in order to improve the following deficits and impairments:  Difficulty walking,Decreased mobility,Decreased range of motion,Decreased strength,Hypomobility,Increased muscle spasms,Postural dysfunction,Improper body mechanics,Impaired flexibility,Pain  Visit Diagnosis: Radiculopathy, lumbar region  Muscle weakness (generalized)  Difficulty in walking, not elsewhere classified  Cramp and spasm      Problem List Patient Active Problem List   Diagnosis Date Noted   Bradycardia 08/20/2020   Acute cervical radiculopathy 07/09/2020   Pruritus 02/06/2020   Raynaud's phenomenon 01/06/2020   Leg cramping 07/25/2018   Left wrist tendinitis 04/30/2018   Left-sided low back pain with sciatica 04/30/2018   Hand numbness 01/24/2018   Lumbar radiculopathy 12/23/2016   Plantar fasciitis of right foot 06/22/2016   Cough 12/19/2014   Carpal tunnel syndrome of right wrist 03/07/2013   Myasthenia gravis without exacerbation  (HCC) 12/31/2010   ALOPECIA 12/31/2010   Hypothyroidism 12/29/2010   Type 2 diabetes, diet controlled (HCC) 12/29/2010   Hyperlipidemia 12/29/2010   Essential hypertension 12/29/2010   Coronary atherosclerosis 12/29/2010   OSTEOARTHRITIS 12/29/2010    Jeri Cos, SPT 02/04/2021, 5:26 PM  [facility] [address] [place], [place], [address] Phone: [phone]   Fax:  [phone]  Name: Chandelle Harkey MRN: [id number] Date of Birth: 08-11-1951

## 2021-02-05 ENCOUNTER — Telehealth: Payer: Medicare HMO

## 2021-02-09 ENCOUNTER — Ambulatory Visit: Payer: Medicare HMO | Attending: Internal Medicine | Admitting: Physical Therapy

## 2021-02-09 ENCOUNTER — Encounter: Payer: Self-pay | Admitting: Physical Therapy

## 2021-02-09 ENCOUNTER — Other Ambulatory Visit: Payer: Self-pay

## 2021-02-09 DIAGNOSIS — R262 Difficulty in walking, not elsewhere classified: Secondary | ICD-10-CM | POA: Diagnosis not present

## 2021-02-09 DIAGNOSIS — R252 Cramp and spasm: Secondary | ICD-10-CM | POA: Insufficient documentation

## 2021-02-09 DIAGNOSIS — M6281 Muscle weakness (generalized): Secondary | ICD-10-CM | POA: Diagnosis not present

## 2021-02-09 DIAGNOSIS — M5416 Radiculopathy, lumbar region: Secondary | ICD-10-CM | POA: Diagnosis not present

## 2021-02-09 NOTE — Therapy (Signed)
[facility] [address] [place], [place], [address] Phone: [phone]   Fax:  [phone]  Physical Therapy Treatment  Patient Details  Name: Jennae Hakeem MRN: [id number] Date of Birth: Apr 21, 1951 Referring Provider (PT): Cheryll Cockayne MD   Encounter Date: 02/09/2021   PT End of Session - 02/09/21 1204    Visit Number 8    Number of Visits 12    Date for PT Re-Evaluation 02/23/21    Authorization Type [employer] MCR    PT Start Time 1151    PT Stop Time 1246    PT Time Calculation (min) 55 min    Activity Tolerance Patient limited by pain    Behavior During Therapy [facility] for tasks assessed/performed           Past Medical History:  Diagnosis Date  . Alopecia areata 10/2010 onset  . Diabetes mellitus, type 2 (HCC)   . Dyslipidemia   . Hypertension   . Hypothyroid   . Myasthenia gravis     Past Surgical History:  Procedure Laterality Date  . ABDOMINAL HYSTERECTOMY  1990    There were no vitals filed for this visit.   Subjective Assessment - 02/09/21 1158    Subjective My back is really bothering me today.  It hurts right across the lower back.  Doing exercise daily and it helps loosen things up. I am a 7 today  it is so hard to move    Pertinent History DM, HTN hyperlipidemia, Myasthenia Gravis, previous hx of lumbar radiculopathy  See med history    How long can you sit comfortably? 30 mminutes to watch a show    How long can you stand comfortably? 15-20 minutes    How long can you walk comfortably? 10-15 minutes and then I take a break    Diagnostic tests MRI 10/01/20. L4-5 severe facet osteoarthritis with anterolisthesis and  left-sided marrow edema. Combined with disc bulging there is  compressive spinal and biforaminal stenosis.  2. L5-S1 degenerative biforaminal impingement.  3. L1-2 noncompressive disc bulging.    Patient Stated Goals Get back to the gym pain free    Currently in Pain? Yes    Pain Score 6      Pain Location Back   back and upper hip   Pain Orientation Right;Left;Lower    Pain Descriptors / Indicators Nagging;Throbbing                             [facility] Adult PT Treatment/Exercise - 02/09/21 0001      Lumbar Exercises: Stretches   Single Knee to Chest Stretch 5 reps;10 seconds    Lower Trunk Rotation 5 reps    Lower Trunk Rotation Limitations 5-10 second holds      Lumbar Exercises: Aerobic   Nustep L5 x 5 min UE/LE      Lumbar Exercises: Standing   Row 10 reps    Theraband Level (Row) Level 1 (Yellow)    Row Limitations VC for core VC posture    Shoulder Extension 10 reps;Strengthening    Theraband Level (Shoulder Extension) Level 1 (Yellow)    Shoulder Extension Limitations VC for core engagement    Other Standing Lumbar Exercises ER 2x5 each side, focusing on core stablization    Other Standing Lumbar Exercises sit to stand  UE suppport on back of chair x 10 and then 5 STS without UE support  hips slightly higer than knees  on high low table      Lumbar Exercises: Supine   Pelvic Tilt 10 reps    Pelvic Tilt Limitations 3 sec holds    Bent Knee Raise 10 reps    Bent Knee Raise Limitations able to do post TPDN    Bridge with [employer] Limitations partial bridge with ball squeeze x 10      Moist Heat Therapy   Number Minutes Moist Heat 15 Minutes   concurrent with HEP   Moist Heat Location Lumbar Spine      Manual Therapy   Manual Therapy Myofascial release;Soft tissue mobilization    Manual therapy comments skilled palpation with TPDN    Soft tissue mobilization STW to bil QL and buttocks    Myofascial Release Left QL            Trigger Point Dry Needling - 02/09/21 0001    Consent Given? Yes    Education Handout Provided Yes    Muscles Treated Back/Hip Gluteus maximus;Gluteus minimus;Quadratus lumborum;Lumbar multifidi    Dry Needling Comments .30 gage    Gluteus Minimus Response Twitch response elicited;Palpable increased  muscle length   Left only   Gluteus Maximus Response Twitch response elicited;Palpable increased muscle length   Left only   Lumbar multifidi Response Twitch response elicited;Palpable increased muscle length   Left only L4 and 5   Quadratus Lumborum Response Twitch response elicited;Palpable increased muscle length   bil               PT Education - 02/09/21 1205    Education Details TPDN educaton aftercare and precautions    Person(s) Educated Patient    Methods Explanation;Demonstration;Handout    Comprehension Verbalized understanding;Returned demonstration            PT Short Term Goals - 02/04/21 1712      PT SHORT TERM GOAL #1   Title Independent with intial HEP    Baseline Pt with difficulty moving due to pain but responds to heat with exercise    Status Achieved      PT SHORT TERM GOAL #2   Title Pt unable to sleep without pain with any movement    Baseline pt unable to sleep greater than 2-3 hours at a time    Time 3    Period Weeks    Status On-going    Target Date 02/02/21      PT SHORT TERM GOAL #3   Title Pt unable to dress self without increased pain ( putting on shoes and socks)    Baseline pt needs extra time and assistance with shoes    Time 3    Period Weeks    Status On-going    Target Date 02/02/21             PT Long Term Goals - 01/12/21 1229      PT LONG TERM GOAL #1   Title Independent with advanced HEP as given including proper use of gym equimpment    Time 6    Period Weeks    Status New    Target Date 02/23/21      PT LONG TERM GOAL #2   Title Improve FOTO intake from 28% to at least 48%    Baseline eval intake 28%    Time 6    Period Weeks    Status New    Target Date 02/23/21      PT LONG TERM GOAL #3   Title Increase  trunk flexion AROM at least 20 deg to improve ability for bending for chores and activities such as donning shoes    Baseline Pt unable to bed forward without increase pain/ difficulty donning clothing     Time 6    Period Weeks    Status New    Target Date 02/23/21      PT LONG TERM GOAL #4   Title Pt will be able to stand and lift items overhead to top shelf in kitchen without exacerbating back pain    Baseline unable to lift items due to back pain    Time 6    Period Weeks    Status New    Target Date 02/23/21      PT LONG TERM GOAL #5   Title Pt will improve L/R hip ext and hip abd / knee flex/ext  strength to >/= 4+/5 with </= without exacerbation of back pain to promote correct use of gym equipment and promote injurty prevention    Baseline See flowsheet for MMT    Time 6    Period Weeks    Status New      Additional Long Term Goals   Additional Long Term Goals Yes      PT LONG TERM GOAL #6   Title Pt will be able to demo lifting groceries from floor to show proper technique and practice injury prevention with household chores    Time 6    Period Weeks    Status New    Target Date 02/23/21                 Plan - 02/09/21 1219    Clinical Impression Statement Pt walking slowly into clinic and stating she is 7/10 pain and hard to do anything because of the pain.  Pt reports she does her exercises daily but pt still reporting high pain levele.  Pt consented to TPDN and was closely monitored throughout session.  Pt had immediated relaxation of muscle tissue tension.  And reported a 5/10 at end of session.  Pt performs all exercises after TPDN except for nustep.  Pt reports more ease of movement post TPDN but still performs slowly and cautiously.  Pt will benefit from progressive overloading as she is able to build strength in core.  Will continue with one more TPDN session scheduled and exercises for POC    Personal Factors and Comorbidities Comorbidity 1    Comorbidities DM, HTN hyperlipidemia, Myasthenia Gravis  See med history    Examination-Activity Limitations Bend;Carry;Dressing;Lift;Locomotion Level;Sit;Squat;Stairs;Stand;Transfers    Examination-Participation  Restrictions Cleaning;Driving    PT Frequency 2x / week    PT Duration 6 weeks    PT Treatment/Interventions ADLs/Self Care Home Management;Aquatic Therapy;Cryotherapy;Electrical Stimulation;Moist Heat;Traction;Ultrasound;Gait training;Stair training;Functional mobility training;Therapeutic activities;Therapeutic exercise;Balance training;Neuromuscular re-education;Manual techniques;Patient/family education;Passive range of motion;Dry needling;Taping;Joint Manipulations;Spinal Manipulations    PT Next Visit Plan FOTO and goals next visit.standing postural theraband exercises, LE strengthening standing exercises, physio ball isometrics, manual PRN. Assess TPDN   has a TPDN on 02-16-21    PT Home Exercise Plan [email or web address]    Consulted and Agree with Plan of Care Patient           Patient will benefit from skilled therapeutic intervention in order to improve the following deficits and impairments:  Difficulty walking,Decreased mobility,Decreased range of motion,Decreased strength,Hypomobility,Increased muscle spasms,Postural dysfunction,Improper body mechanics,Impaired flexibility,Pain  Visit Diagnosis: Radiculopathy, lumbar region  Muscle weakness (generalized)  Difficulty in walking, not elsewhere classified  Cramp and spasm  Problem List Patient Active Problem List   Diagnosis Date Noted  . Bradycardia 08/20/2020  . Acute cervical radiculopathy 07/09/2020  . Pruritus 02/06/2020  . Raynaud's phenomenon 01/06/2020  . Leg cramping 07/25/2018  . Left wrist tendinitis 04/30/2018  . Left-sided low back pain with sciatica 04/30/2018  . Hand numbness 01/24/2018  . Lumbar radiculopathy 12/23/2016  . Plantar fasciitis of right foot 06/22/2016  . Cough 12/19/2014  . Carpal tunnel syndrome of right wrist 03/07/2013  . Myasthenia gravis without exacerbation (HCC) 12/31/2010  . ALOPECIA 12/31/2010  . Hypothyroidism 12/29/2010  . Type 2 diabetes, diet controlled (HCC) 12/29/2010  .  Hyperlipidemia 12/29/2010  . Essential hypertension 12/29/2010  . Coronary atherosclerosis 12/29/2010  . OSTEOARTHRITIS 12/29/2010   Garen LahLawrie Joya Willmott, PT, ATRIC Certified Exercise Expert for the Aging Adult  02/09/21 1:02 PM Phone: [phone] Fax: [phone]  [facility] [address] [place], [place], [address] Phone: [phone]   Fax:  [phone]  Name: Stephenie AcresCynthia Lowery Burnsed MRN: [id number] Date of Birth: [birth year]

## 2021-02-09 NOTE — Patient Instructions (Addendum)
Trigger Point Dry Needling  . What is Trigger Point Dry Needling (DN)? o DN is a physical therapy technique used to treat muscle pain and dysfunction. Specifically, DN helps deactivate muscle trigger points (muscle knots).  o A thin filiform needle is used to penetrate the skin and stimulate the underlying trigger point. The goal is for a local twitch response (LTR) to occur and for the trigger point to relax. No medication of any kind is injected during the procedure.   . What Does Trigger Point Dry Needling Feel Like?  o The procedure feels different for each individual patient. Some patients report that they do not actually feel the needle enter the skin and overall the process is not painful. Very mild bleeding may occur. However, many patients feel a deep cramping in the muscle in which the needle was inserted. This is the local twitch response.   Marland Kitchen How Will I feel after the treatment? o Soreness is normal, and the onset of soreness may not occur for a few hours. Typically this soreness does not last longer than two days.  o Bruising is uncommon, however; ice can be used to decrease any possible bruising.  o In rare cases feeling tired or nauseous after the treatment is normal. In addition, your symptoms may get worse before they get better, this period will typically not last longer than 24 hours.   . What Can I do After My Treatment? o Increase your hydration by drinking more water for the next 24 hours. o You may place ice or heat on the areas treated that have become sore, however, do not use heat on inflamed or bruised areas. Heat often brings more relief post needling. o You can continue your regular activities, but vigorous activity is not recommended initially after the treatment for 24 hours. o DN is best combined with other physical therapy such as strengthening, stretching, and other therapies.   Garen Lah, PT, ATRIC Certified Exercise Expert for the Aging Adult  02/09/21  12:05 PM Phone: [phone] Fax: [phone]

## 2021-02-11 ENCOUNTER — Ambulatory Visit: Payer: Medicare HMO | Admitting: Physical Therapy

## 2021-02-11 ENCOUNTER — Encounter: Payer: Self-pay | Admitting: Physical Therapy

## 2021-02-11 ENCOUNTER — Other Ambulatory Visit: Payer: Self-pay

## 2021-02-11 DIAGNOSIS — R252 Cramp and spasm: Secondary | ICD-10-CM | POA: Diagnosis not present

## 2021-02-11 DIAGNOSIS — M5416 Radiculopathy, lumbar region: Secondary | ICD-10-CM

## 2021-02-11 DIAGNOSIS — R262 Difficulty in walking, not elsewhere classified: Secondary | ICD-10-CM | POA: Diagnosis not present

## 2021-02-11 DIAGNOSIS — M6281 Muscle weakness (generalized): Secondary | ICD-10-CM | POA: Diagnosis not present

## 2021-02-11 NOTE — Therapy (Addendum)
[place], [place], [address] Phone: [phone]   Fax:  [phone]  Physical Therapy Treatment  Patient Details  Name: Gloria [name] MRN: [id number] Date of Birth: [birth year] Referring Provider (PT): Billey Gosling MD   Encounter Date: 02/11/2021   PT End of Session - 02/11/21 1628     Visit Number 9    Number of Visits 12    Date for PT Re-Evaluation 02/23/21    Authorization Type [employer] MCR    PT Start Time 1619   pt arrived late   PT Stop Time 1711    PT Time Calculation (min) 52 min    Activity Tolerance Patient limited by pain    Behavior During Therapy [facility] for tasks assessed/performed             Past Medical History:  Diagnosis Date   Alopecia areata 10/2010 onset   Diabetes mellitus, type 2 ([place])    Dyslipidemia    Hypertension    Hypothyroid    Myasthenia gravis     Past Surgical History:  Procedure Laterality Date   ABDOMINAL HYSTERECTOMY  1990    There were no vitals filed for this visit.   Subjective Assessment - 02/11/21 1621     Subjective Back is tender to the middle left today. Thought the dry needling was helpful.    Currently in Pain? Yes    Pain Score 6     Pain Location Back    Pain Orientation Left    Pain Descriptors / Indicators Nagging    Pain Type Chronic pain    Pain Onset More than a month ago    Pain Frequency Constant                                               [facility] Adult PT Treatment/Exercise - 02/11/21 0001       Self-Care   Self-Care Other Self-Care Comments    Other Self-Care Comments  address short term goals, assess FOTO      Lumbar Exercises: Stretches   Single Knee to Chest Stretch 5 reps;10 seconds    Other Lumbar Stretch Exercise seated ball rollouts 3 ways x10      Lumbar Exercises: Aerobic   Nustep L5 x 5 min UE/LE      Lumbar Exercises: Standing   Other Standing Lumbar Exercises sit to stand UE's  on thighs x5, x5 with hands on counter      Lumbar Exercises: Seated   Other Seated Lumbar Exercises physio ball isometrics front and laterally x10      Lumbar Exercises: Supine   Bent Knee Raise --   12 reps   Bent Knee Raise Limitations needed hand placement under back    Bridge with [employer] Limitations partial bridge with ball squeeze x 10      Moist Heat Therapy   Number Minutes Moist Heat 10 Minutes    Moist Heat Location Lumbar Spine      Manual Therapy   Manual therapy comments LAD bil hips                          PT Education - 02/11/21 1628     Education Details HEP    Person(s) Educated Patient    Methods Explanation;Demonstration  Comprehension Verbalized understanding;Need further instruction              PT Short Term Goals - 02/11/21 1626       PT SHORT TERM GOAL #1   Title Independent with intial HEP    Baseline Pt with difficulty moving due to pain but responds to heat with exercise    Status Achieved      PT SHORT TERM GOAL #2   Title Pt unable to sleep without pain with any movement    Baseline 2-4 hours    Time --    Period --    Status On-going    Target Date --      PT SHORT TERM GOAL #3   Title Pt unable to dress self without increased pain ( putting on shoes and socks)    Baseline needs assistance/prop feet up on stool to put shoes on and sometimes pain still when she does this.    Time --    Period --    Status On-going    Target Date --                PT Long Term Goals - 02/11/21 1638       PT LONG TERM GOAL #1   Title Independent with advanced HEP as given including proper use of gym equimpment    Time 6    Period Weeks    Status New    Target Date 02/23/21      PT LONG TERM GOAL #2   Title Improve FOTO intake from 28% to at least 48%    Baseline currently 40%    Time 6    Period Weeks    Status On-going    Target Date 02/23/21      PT LONG TERM GOAL #3   Title Increase trunk flexion AROM at  least 20 deg to improve ability for bending for chores and activities such as donning shoes    Baseline Pt unable to bed forward without increase pain/ difficulty donning clothing    Time 6    Period Weeks    Status New    Target Date 02/23/21      PT LONG TERM GOAL #4   Title Pt will be able to stand and lift items overhead to top shelf in kitchen without exacerbating back pain    Baseline unable to lift items due to back pain    Time 6    Period Weeks    Status New    Target Date 02/23/21      PT LONG TERM GOAL #5   Title Pt will improve L/R hip ext and hip abd / knee flex/ext  strength to >/= 4+/5 with </= without exacerbation of back pain to promote correct use of gym equipment and promote injurty prevention    Baseline See flowsheet for MMT    Time 6    Period Weeks    Status New    Target Date 02/23/21      PT LONG TERM GOAL #6   Title Pt will be able to demo lifting groceries from floor to show proper technique and practice injury prevention with household chores    Time 6    Period Weeks    Status New    Target Date 02/23/21                        Plan - 02/11/21 1706  Clinical Impression Statement Pt walking slowly today, was at a 7/10 pain, and was slow and hesistant to perform exercises today. FOTO improved this session since last measurement but pt still limited and hasn't met short term goals. Session focused on stretching hips/lower back as well as core strengthening. PT tried to load sit to stands but pt was not tolerant towards it today due to pain. Session ended with LAD manual and heat to help with pain modulation. Pt would continue to benefit from skilled PT in order to help build core strengthening, LE/functoinal strength, and increase ROM without pain in order to help with basic ADLs/IADLs and increase ambulation.    PT Treatment/Interventions ADLs/Self Care Home Management;Aquatic Therapy;Cryotherapy;Electrical Stimulation;Moist  Heat;Traction;Ultrasound;Gait training;Stair training;Functional mobility training;Therapeutic activities;Therapeutic exercise;Balance training;Neuromuscular re-education;Manual techniques;Patient/family education;Passive range of motion;Dry needling;Taping;Joint Manipulations;Spinal Manipulations    PT Next Visit Plan standing postural theraband exercises, LE strengthening standing exercises, physio ball isometrics, manual PRN    PT Home Exercise Plan [email or web address]    Consulted and Agree with Plan of Care Patient             Patient will benefit from skilled therapeutic intervention in order to improve the following deficits and impairments:  Difficulty walking,Decreased mobility,Decreased range of motion,Decreased strength,Hypomobility,Increased muscle spasms,Postural dysfunction,Improper body mechanics,Impaired flexibility,Pain  Visit Diagnosis: Radiculopathy, lumbar region  Muscle weakness (generalized)  Difficulty in walking, not elsewhere classified  Cramp and spasm      Problem List Patient Active Problem List   Diagnosis Date Noted   Bradycardia 08/20/2020   Acute cervical radiculopathy 07/09/2020   Pruritus 02/06/2020   Raynaud's phenomenon 01/06/2020   Leg cramping 07/25/2018   Left wrist tendinitis 04/30/2018   Left-sided low back pain with sciatica 04/30/2018   Hand numbness 01/24/2018   Lumbar radiculopathy 12/23/2016   Plantar fasciitis of right foot 06/22/2016   Cough 12/19/2014   Carpal tunnel syndrome of right wrist 03/07/2013   Myasthenia gravis without exacerbation ([place]) 12/31/2010   ALOPECIA 12/31/2010   Hypothyroidism 12/29/2010   Type 2 diabetes, diet controlled ([place]) 12/29/2010   Hyperlipidemia 12/29/2010   Essential hypertension 12/29/2010   Coronary atherosclerosis 12/29/2010   OSTEOARTHRITIS 12/29/2010    Caleb Popp, SPT 02/11/2021, 5:11 PM  [facility] [address] [place], [place],  [address] Phone: [phone]   Fax:  [phone]  Name: Gloria [name] MRN: [id number] Date of Birth: 1951-11-11

## 2021-02-16 ENCOUNTER — Encounter: Payer: Medicare HMO | Admitting: Physical Therapy

## 2021-02-16 ENCOUNTER — Other Ambulatory Visit: Payer: Self-pay

## 2021-02-16 ENCOUNTER — Ambulatory Visit: Payer: Medicare HMO | Admitting: Physical Therapy

## 2021-02-16 DIAGNOSIS — R262 Difficulty in walking, not elsewhere classified: Secondary | ICD-10-CM

## 2021-02-16 DIAGNOSIS — R252 Cramp and spasm: Secondary | ICD-10-CM | POA: Diagnosis not present

## 2021-02-16 DIAGNOSIS — M5416 Radiculopathy, lumbar region: Secondary | ICD-10-CM

## 2021-02-16 DIAGNOSIS — M6281 Muscle weakness (generalized): Secondary | ICD-10-CM | POA: Diagnosis not present

## 2021-02-16 NOTE — Patient Instructions (Signed)
      Garen Lah, PT, ATRIC Certified Exercise Expert for the Aging Adult  02/16/21 9:48 AM Phone: [phone] Fax: [phone]

## 2021-02-16 NOTE — Therapy (Signed)
[facility] [address] [place], [place], [address] Phone: [phone]   Fax:  [phone]  Physical Therapy Treatment/Progress Note  Progress Note Reporting Period 01-13-21 to 02-16-21  See note below for Objective Data and Assessment of Progress/Goals.        Patient Details  Name: Stephenie AcresCynthia Lowery Huwe MRN: [id number] Date of Birth: 1950-12-21 Referring Provider (PT): Cheryll CockayneBurns ,Stacy MD   Encounter Date: 02/16/2021   PT End of Session - 02/16/21 0904    Visit Number 10    Number of Visits 12    Date for PT Re-Evaluation 02/23/21    Authorization Type [employer] MCR    PT Start Time 0903    PT Stop Time 0955    PT Time Calculation (min) 52 min    Activity Tolerance Patient tolerated treatment well    Behavior During Therapy [facility] for tasks assessed/performed           Past Medical History:  Diagnosis Date  . Alopecia areata 10/2010 onset  . Diabetes mellitus, type 2 (HCC)   . Dyslipidemia   . Hypertension   . Hypothyroid   . Myasthenia gravis     Past Surgical History:  Procedure Laterality Date  . ABDOMINAL HYSTERECTOMY  1990    There were no vitals filed for this visit.   Subjective Assessment - 02/16/21 0904    Subjective I have an appt at 10:30.  I am always around a 6/10 in pain.  I do think the needling does help but I don't really like needles   I want to be able to return to the gym    Pertinent History DM, HTN hyperlipidemia, Myasthenia Gravis, previous hx of lumbar radiculopathy  See med history    How long can you sit comfortably? 30 mminutes to watch a show    How long can you stand comfortably? 15-20 minutes    How long can you walk comfortably? 10-15 minutes and then I take a break    Diagnostic tests MRI 10/01/20. L4-5 severe facet osteoarthritis with anterolisthesis and  left-sided marrow edema. Combined with disc bulging there is  compressive spinal and biforaminal stenosis.  2. L5-S1 degenerative  biforaminal impingement.  3. L1-2 noncompressive disc bulging.    Patient Stated Goals Get back to the gym pain free    Currently in Pain? Yes    Pain Score 6     Pain Location Back    Pain Orientation Left    Pain Descriptors / Indicators Nagging    Pain Type Chronic pain    Pain Onset More than a month ago              [facility] - 02/16/21 0001      Assessment   Medical Diagnosis Lumbar radiculopathy    Referring Provider (PT) Cheryll CockayneBurns ,Stacy MD    Onset Date/Surgical Date 12/28/20   aproximately 2-3 weeks     Observation/Other Assessments   Focus on Therapeutic Outcomes (FOTO)  FOTO Intake 40% predicted 48%   taken 02-11-21     Functional Tests   Functional tests Sit to Stand      Sit to Stand   Comments 39.78      AROM   Overall AROM  Deficits    Lumbar Flexion 70   able to touch shins   Lumbar Extension 10   10   Lumbar - Right Side Bend 22    Lumbar - Left Side Bend 19  more pain to left   Lumbar - Right Rotation 50%    Lumbar - Left Rotation 50%      Strength   Overall Strength Deficits    Right Hip Flexion 4/5    Right Hip Extension 4/5    Right Hip External Rotation  4+/5    Right Hip Internal Rotation 4+/5    Right Hip ABduction 4-/5    Left Hip Flexion 4/5    Left Hip Extension 4-/5    Left Hip External Rotation 4+/5    Left Hip Internal Rotation 4+/5    Left Hip ABduction 3+/5    Right Knee Flexion 5/5    Right Knee Extension 5/5    Left Knee Flexion 4+/5    Left Knee Extension 4/5    Right Ankle Dorsiflexion 4+/5    Left Ankle Dorsiflexion 4-/5      Flexibility   Hamstrings Rt 60 Lt 70                         [facility] Adult PT Treatment/Exercise - 02/16/21 0001      Self-Care   Self-Care Lifting;Posture    Lifting 10 lb KB proper lift with 8 inch elevated surface  lifting principles    Posture proper posture for hip hinging      Lumbar Exercises: Standing   Other Standing Lumbar Exercises goblet squat with 10 lb x  10,  deadlift on 8 inch elevated surface iwth VC and TC for proper lifting standing with red t band at counter for R and L hip ext with red t band,   R and L hip abd   R and L marching  10 each    Other Standing Lumbar Exercises sit to stand with 10 lb KB x 10   hip hinge with dowel 15 x and 15 x with wall as external cue      Moist Heat Therapy   Number Minutes Moist Heat 15 Minutes    Moist Heat Location Lumbar Spine      Manual Therapy   Manual Therapy Myofascial release;Soft tissue mobilization    Manual therapy comments skilled palpation with TPDN    Soft tissue mobilization STW to bil QL and buttocks    Myofascial Release bil QL            Trigger Point Dry Needling - 02/16/21 0001    Consent Given? Yes    Education Handout Provided No    Muscles Treated Back/Hip Gluteus maximus;Gluteus minimus;Quadratus lumborum;Lumbar multifidi    Dry Needling Comments .30 gage . 50 mm 30 gage    Gluteus Minimus Response Twitch response elicited;Palpable increased muscle length    Gluteus Maximus Response Palpable increased muscle length;Twitch response elicited    Lumbar multifidi Response Palpable increased muscle length    Quadratus Lumborum Response Twitch response elicited;Palpable increased muscle length                PT Education - 02/16/21 0948    Education Details Progress note to MD, added to HEP for goblet squat at counter, deadlift on elevated surface and hip hinge    Person(s) Educated Patient    Methods Explanation;Demonstration;Tactile cues;Verbal cues;Handout    Comprehension Verbalized understanding;Returned demonstration            PT Short Term Goals - 02/16/21 0905      PT SHORT TERM GOAL #1   Title Independent with intial HEP    Baseline  able to be independent with exercises in the AM and at PM    Time 3    Period Weeks    Status Achieved    Target Date 02/02/21      PT SHORT TERM GOAL #2   Title Pt unable to sleep without pain with any  movement    Baseline I am able to sleep up to sleep 5 hours now    Time 3    Period Weeks    Status Achieved      PT SHORT TERM GOAL #3   Title Pt unable to dress self without increased pain ( putting on shoes and socks)    Baseline pt able to get R ankle up on left knee. Pt able to dress with socks and shoes but still painful on R >L    Time 3    Period Weeks    Status Achieved             PT Long Term Goals - 02/16/21 0908      PT LONG TERM GOAL #1   Title Independent with advanced HEP as given including proper use of gym equimpment    Baseline beginning to instruct for going back to the gym iniital strength with hip hinge, deadlift and squat    Time 6    Period Weeks    Status On-going      PT LONG TERM GOAL #2   Title Improve FOTO intake from 28% to at least 48%    Baseline currently 40% on 02-11-21    Time 6    Period Weeks    Status On-going      PT LONG TERM GOAL #3   Title Increase trunk flexion AROM at least 20 deg to improve ability for bending for chores and activities such as donning shoes    Baseline Pt able to bend forward 70 degrees   able to touch shins also able to sit and bring ankle to knee, does complain about pain but able to do it    Time 6    Period Weeks    Status Achieved      PT LONG TERM GOAL #4   Title Pt will be able to stand and lift items overhead to top shelf in kitchen without exacerbating back pain    Time 6    Period Weeks    Status Unable to assess      PT LONG TERM GOAL #5   Title Pt will improve L/R hip ext and hip abd / knee flex/ext  strength to >/= 4+/5 with </= without exacerbation of back pain to promote correct use of gym equipment and promote injurty prevention    Baseline See flowsheet for MMT    Time 6    Period Weeks    Status On-going      PT LONG TERM GOAL #6   Title Pt will be able to demo lifting groceries from floor to show proper technique and practice injury prevention with household chores    Baseline began  deadlifting on elevated surface today    Time 6    Period Weeks    Status On-going                 Plan - 02/16/21 0929    Clinical Impression Statement Ms Marcoe is able to walk more briskly into clinic without AD today. Pt complains about 6/10 pain but is now able to lift ankle to knee to don  shoes and socks.  Pt also able to flex forward 70 degrees.   Pt able to perform 5 x STS correctly in 39.78 sec ( eval was only able to peform 1 x in 30 sec.   Pt has gained in MMT and has been educated in deadlift, hip hinge and squat and will benefit from continued skilled Pt for pain management and strengthening.    All STG acheived and one LTG achieved.  will continue POC    Personal Factors and Comorbidities Comorbidity 1    Comorbidities DM, HTN hyperlipidemia, Myasthenia Gravis  See med history    Examination-Activity Limitations Bend;Carry;Dressing;Lift;Locomotion Level;Sit;Squat;Stairs;Stand;Transfers    Examination-Participation Restrictions Cleaning;Driving    PT Treatment/Interventions ADLs/Self Care Home Management;Aquatic Therapy;Cryotherapy;Electrical Stimulation;Moist Heat;Traction;Ultrasound;Gait training;Stair training;Functional mobility training;Therapeutic activities;Therapeutic exercise;Balance training;Neuromuscular re-education;Manual techniques;Patient/family education;Passive range of motion;Dry needling;Taping;Joint Manipulations;Spinal Manipulations    PT Next Visit Plan standing postural theraband exercises, LE strengthening standing exercises, physio ball isometrics, manual PRN reniforce deadlift , squat and hip hinge  teach gym equipment and proper use   pain management    PT Home Exercise Plan [email or web address]    Consulted and Agree with Plan of Care Patient           Patient will benefit from skilled therapeutic intervention in order to improve the following deficits and impairments:  Difficulty walking,Decreased mobility,Decreased range of motion,Decreased  strength,Hypomobility,Increased muscle spasms,Postural dysfunction,Improper body mechanics,Impaired flexibility,Pain  Visit Diagnosis: Radiculopathy, lumbar region  Muscle weakness (generalized)  Difficulty in walking, not elsewhere classified  Cramp and spasm Access Code: [email or web address]: [email or web address]: 03/08/2022Prepared by: Wayland Denis BeardsleyExercises   Standing Hip Hinge with Dowel - 1 x daily - 7 x weekly - 10 reps - 3 sets  Goblet Squat with Kettlebell - 1 x daily - 7 x weekly - 3 sets - 10 reps  Kettlebell Deadlift - 1 x daily - 7 x weekly - 3 sets     Problem List Patient Active Problem List   Diagnosis Date Noted  . Bradycardia 08/20/2020  . Acute cervical radiculopathy 07/09/2020  . Pruritus 02/06/2020  . Raynaud's phenomenon 01/06/2020  . Leg cramping 07/25/2018  . Left wrist tendinitis 04/30/2018  . Left-sided low back pain with sciatica 04/30/2018  . Hand numbness 01/24/2018  . Lumbar radiculopathy 12/23/2016  . Plantar fasciitis of right foot 06/22/2016  . Cough 12/19/2014  . Carpal tunnel syndrome of right wrist 03/07/2013  . Myasthenia gravis without exacerbation (HCC) 12/31/2010  . ALOPECIA 12/31/2010  . Hypothyroidism 12/29/2010  . Type 2 diabetes, diet controlled (HCC) 12/29/2010  . Hyperlipidemia 12/29/2010  . Essential hypertension 12/29/2010  . Coronary atherosclerosis 12/29/2010  . OSTEOARTHRITIS 12/29/2010    Garen Lah, PT, ATRIC Certified Exercise Expert for the Aging Adult  02/16/21 10:14 AM Phone: [phone] Fax: [phone]  [facility] [address] [place], [place], [address] Phone: [phone]   Fax:  [phone]  Name: Amoura Ransier MRN: [id number] Date of Birth: 03/06/1951

## 2021-02-17 ENCOUNTER — Ambulatory Visit: Payer: Medicare HMO | Admitting: Internal Medicine

## 2021-02-18 ENCOUNTER — Ambulatory Visit: Payer: Medicare HMO | Admitting: Physical Therapy

## 2021-02-18 ENCOUNTER — Other Ambulatory Visit: Payer: Self-pay

## 2021-02-18 ENCOUNTER — Encounter: Payer: Self-pay | Admitting: Physical Therapy

## 2021-02-18 DIAGNOSIS — R262 Difficulty in walking, not elsewhere classified: Secondary | ICD-10-CM

## 2021-02-18 DIAGNOSIS — M5416 Radiculopathy, lumbar region: Secondary | ICD-10-CM | POA: Diagnosis not present

## 2021-02-18 DIAGNOSIS — M6281 Muscle weakness (generalized): Secondary | ICD-10-CM

## 2021-02-18 DIAGNOSIS — R252 Cramp and spasm: Secondary | ICD-10-CM

## 2021-02-19 ENCOUNTER — Other Ambulatory Visit: Payer: Self-pay

## 2021-02-19 NOTE — Therapy (Addendum)
[facility] [address] [place], [place], [address] Phone: [phone]   Fax:  [phone]  Physical Therapy Treatment / ERO  Patient Details  Name: Stephenie AcresCynthia Lowery Oscar MRN: [id number] Date of Birth: 1951-08-07 Referring Provider (PT): Cheryll CockayneBurns ,Stacy MD   Encounter Date: 02/18/2021   PT End of Session - 02/19/21 0827     Visit Number 11    Number of Visits 17    Date for PT Re-Evaluation 04/01/21    Authorization Type [employer] MCR    Progress Note Due on Visit 20    PT Start Time 1623    PT Stop Time 1715    PT Time Calculation (min) 52 min    Activity Tolerance Patient tolerated treatment well    Behavior During Therapy [facility] for tasks assessed/performed             Past Medical History:  Diagnosis Date   Alopecia areata 10/2010 onset   Diabetes mellitus, type 2 (HCC)    Dyslipidemia    Hypertension    Hypothyroid    Myasthenia gravis     Past Surgical History:  Procedure Laterality Date   ABDOMINAL HYSTERECTOMY  1990    There were no vitals filed for this visit.   Subjective Assessment - 02/18/21 1630     Subjective I liked the needling but don't think I want to do it again because I don't like needles. Getting better since last visit and doing the lifting.    How long can you sit comfortably? 30 minutes to watch a show    How long can you stand comfortably? 15-20 minutes    How long can you walk comfortably? 10-15 minutes and then I take a break    Patient Stated Goals Get back to the gym pain free    Currently in Pain? Yes    Pain Score 5     Pain Location Back    Pain Orientation Lower    Pain Descriptors / Indicators --   'it's just there'   Pain Type Chronic pain    Pain Onset More than a month ago    Pain Frequency Constant                  [facility] PT Assessment - 02/19/21 0001       Assessment   Medical Diagnosis Lumbar radiculopathy    Referring Provider (PT) Cheryll CockayneBurns ,Stacy MD    Onset  Date/Surgical Date 12/28/20      Precautions   Precautions None      Restrictions   Weight Bearing Restrictions No      Prior Function   Level of Independence Independent      Observation/Other Assessments   Focus on Therapeutic Outcomes (FOTO)  FOTO 40% functional status   assessed 02/11/21                                        [facility] Adult PT Treatment/Exercise - 02/19/21 0001       Self-Care   Other Self-Care Comments  Update POC      Lumbar Exercises: Aerobic   Nustep L3 x 5 min LE      Lumbar Exercises: Standing   Row --   12 reps   Theraband Level (Row) Level 1 (Yellow)    Shoulder Extension --   12 reps   Theraband Level (Shoulder Extension) Level  1 (Yellow)    Other Standing Lumbar Exercises goblet squat 10 # x10; hip hinge with dowel 2x10, one set with red theraband about knees to prevent valgus, deadlift x12 with 10# to 8" box with red band around knees to prevent valgus    Other Standing Lumbar Exercises sit to stand with 10# KB; x10 hip extension and abduction with 2# ankle weight; palloff press yellbow double band x10 bilaterally      Lumbar Exercises: Supine   Bridge with Ball Squeeze Limitations partial bridge x10    Other Supine Lumbar Exercises 90/90 pelvic tilts holds 10 seconds x3      Moist Heat Therapy   Number Minutes Moist Heat 10 Minutes    Moist Heat Location Lumbar Spine      Manual Therapy   Manual therapy comments LAD bilateral LE                          PT Education - 02/19/21 0837     Education Details continue HEP at home    Person(s) Educated Patient    Methods Explanation;Demonstration;Tactile cues    Comprehension Verbalized understanding;Returned demonstration              PT Short Term Goals - 02/16/21 0905       PT SHORT TERM GOAL #1   Title Independent with intial HEP    Baseline able to be independent with exercises in the AM and at PM    Time 3    Period Weeks    Status Achieved     Target Date 02/02/21      PT SHORT TERM GOAL #2   Title Pt unable to sleep without pain with any movement    Baseline I am able to sleep up to sleep 5 hours now    Time 3    Period Weeks    Status Achieved      PT SHORT TERM GOAL #3   Title Pt unable to dress self without increased pain ( putting on shoes and socks)    Baseline pt able to get R ankle up on left knee. Pt able to dress with socks and shoes but still painful on R >L    Time 3    Period Weeks    Status Achieved                PT Long Term Goals - 02/19/21 0931       PT LONG TERM GOAL #1   Title Independent with advanced HEP as given including proper use of gym equimpment    Baseline beginning to instruct for going back to the gym iniital strength with hip hinge, deadlift and squat    Time 6    Period Weeks    Status On-going    Target Date 04/01/21      PT LONG TERM GOAL #2   Title Improve FOTO intake from 28% to at least 48%    Baseline 40% on 02-11-21    Time 6    Period Weeks    Status On-going    Target Date 04/01/21      PT LONG TERM GOAL #3   Title Increase trunk flexion AROM at least 20 deg to improve ability for bending for chores and activities such as donning shoes    Baseline Pt able to bend forward 70 degrees   able to touch shins also able to sit and bring ankle to  knee, does complain about pain but able to do it    Time 6    Period Weeks    Status Achieved    Target Date 04/01/21      PT LONG TERM GOAL #4   Title Pt will be able to stand and lift items overhead to top shelf in kitchen without exacerbating back pain    Baseline Pt reports continued to have back pain lifting overhead    Time 6    Period Weeks    Status On-going    Target Date 04/01/21      PT LONG TERM GOAL #5   Title Pt will improve L/R hip ext and hip abd / knee flex/ext  strength to >/= 4+/5 with </= without exacerbation of back pain to promote correct use of gym equipment and promote injury prevention    Baseline  Strength assessed 02/16/21, pt continues to have deficits with hip and knee strength    Time 6    Period Weeks    Status On-going    Target Date 04/01/21      PT LONG TERM GOAL #6   Title Pt will be able to demo lifting groceries from floor to show proper technique and practice injury prevention with household chores    Baseline Pt progressing with deadlifting from elevated surface    Time 6    Period Weeks    Status On-going    Target Date 04/01/21                        Plan - 02/19/21 4709     Clinical Impression Statement Pt tolerated therapy well and is progressing well toward established goals. Pt walks into PT this session at a faster gait speed this session. Session focused on loading pt on standing exercises, such as squats and deadlifts.  On Nustep, pt was able to for the first time to not use the UE supports and was able to move LE at a quicker speed. Pt continues to benefit from skilled PT in order to further progress lumbar/core stabilization exercises, LE exercises, and continue to progress therapeutic lifting activities in order to allow the pt to get back into the gym, decrease pain, teach proper lifting mechanics, and increase ambulation ability. Pt was scheduled for 1x/week for 4 more weeks to progress her.    PT Frequency 1x / week    PT Duration 6 weeks    PT Treatment/Interventions ADLs/Self Care Home Management;Aquatic Therapy;Cryotherapy;Electrical Stimulation;Moist Heat;Traction;Ultrasound;Gait training;Stair training;Functional mobility training;Therapeutic activities;Therapeutic exercise;Balance training;Neuromuscular re-education;Manual techniques;Patient/family education;Passive range of motion;Dry needling;Taping;Joint Manipulations;Spinal Manipulations    PT Next Visit Plan further load squats, deadlifts, sit to stands, LE ankle weight exercises, continue nustep without UE's    PT Home Exercise Plan [email or web address]    Consulted and Agree with Plan of Care Patient              Patient will benefit from skilled therapeutic intervention in order to improve the following deficits and impairments:  Difficulty walking,Decreased mobility,Decreased range of motion,Decreased strength,Hypomobility,Increased muscle spasms,Postural dysfunction,Improper body mechanics,Impaired flexibility,Pain  Visit Diagnosis: Radiculopathy, lumbar region  Muscle weakness (generalized)  Difficulty in walking, not elsewhere classified  Cramp and spasm      Problem List Patient Active Problem List   Diagnosis Date Noted   Bradycardia 08/20/2020   Acute cervical radiculopathy 07/09/2020   Pruritus 02/06/2020   Raynaud's phenomenon 01/06/2020   Leg cramping 07/25/2018   Left wrist tendinitis 04/30/2018  Left-sided low back pain with sciatica 04/30/2018   Hand numbness 01/24/2018   Lumbar radiculopathy 12/23/2016   Plantar fasciitis of right foot 06/22/2016   Cough 12/19/2014   Carpal tunnel syndrome of right wrist 03/07/2013   Myasthenia gravis without exacerbation (HCC) 12/31/2010   ALOPECIA 12/31/2010   Hypothyroidism 12/29/2010   Type 2 diabetes, diet controlled (HCC) 12/29/2010   Hyperlipidemia 12/29/2010   Essential hypertension 12/29/2010   Coronary atherosclerosis 12/29/2010   OSTEOARTHRITIS 12/29/2010    Jeri Cos, SPT 02/19/2021, 9:40 AM  [facility] [address] [place], [place], [address] Phone: [phone]   Fax:  [phone]  Name: Nayeli Calvert MRN: [id number] Date of Birth: [birth year]   Referring diagnosis? M54.16 Treatment diagnosis? (if different than referring diagnosis) M54.16 What was this (referring dx) caused by? []  Surgery []  Fall [x]  Ongoing issue []  Arthritis []  Other: ____________  Laterality: []  Rt []  Lt [x]  Both  Check all possible CPT codes:      []  97110 (Therapeutic Exercise)  []  (SLP Treatment)  []  97112 (Neuro Re-ed)   []  92526  (Swallowing Treatment)   []  97116 (Gait Training)   []  (Cognitive Training, 1st 15 minutes) []  97140 (Manual Therapy)   []  97130 (Cognitive Training, each add'l 15 minutes)  []  97530 (Therapeutic Activities)  []  Other, List CPT Code ____________    []  97535 (Self Care)       [x]  All codes above (97110 - 97535)  []  97012 (Mechanical Traction)  [x]  97014 (E-stim Unattended)  []  97032 (E-stim manual)  []  97033 (Ionto)  []  97035 (Ultrasound)  []  97760 (Orthotic Fit) []  97750 (Physical Performance Training) []  (Aquatic Therapy) []  97034 (Contrast Bath) []  (Paraffin) []  97597 (Wound Care 1st 20 sq cm) []  97598 (Wound Care each add'l 20 sq cm) []  97016 (Vasopneumatic Device) []   ) []  (Prosthetic Training)

## 2021-02-22 ENCOUNTER — Ambulatory Visit (INDEPENDENT_AMBULATORY_CARE_PROVIDER_SITE_OTHER): Payer: Medicare HMO | Admitting: Pharmacist

## 2021-02-22 ENCOUNTER — Other Ambulatory Visit: Payer: Self-pay

## 2021-02-22 DIAGNOSIS — I2510 Atherosclerotic heart disease of native coronary artery without angina pectoris: Secondary | ICD-10-CM

## 2021-02-22 DIAGNOSIS — E039 Hypothyroidism, unspecified: Secondary | ICD-10-CM | POA: Diagnosis not present

## 2021-02-22 DIAGNOSIS — G7000 Myasthenia gravis without (acute) exacerbation: Secondary | ICD-10-CM

## 2021-02-22 DIAGNOSIS — I10 Essential (primary) hypertension: Secondary | ICD-10-CM

## 2021-02-22 DIAGNOSIS — E119 Type 2 diabetes mellitus without complications: Secondary | ICD-10-CM | POA: Diagnosis not present

## 2021-02-22 DIAGNOSIS — E7849 Other hyperlipidemia: Secondary | ICD-10-CM | POA: Diagnosis not present

## 2021-02-22 NOTE — Progress Notes (Signed)
Chronic Care Management Pharmacy Note  02/22/2021 Name:  Gloria [name] MRN:  [id number] DOB:  13-Feb-1951  Subjective: Gloria [name] is an 70 y.o. year old female who is a primary patient of Burns, Claudina Lick, MD.  The CCM team was consulted for assistance with disease management and care coordination needs.    Engaged with patient by telephone for initial visit in response to provider referral for pharmacy case management and/or care coordination services.   Consent to Services:  The patient was given the following information about Chronic Care Management services today, agreed to services, and gave verbal consent: 1. CCM service includes personalized support from designated clinical staff supervised by the primary care provider, including individualized plan of care and coordination with other care providers 2. 24/7 contact phone numbers for assistance for urgent and routine care needs. 3. Service will only be billed when office clinical staff spend 20 minutes or more in a month to coordinate care. 4. Only one practitioner may furnish and bill the service in a calendar month. 5.The patient may stop CCM services at any time (effective at the end of the month) by phone call to the office staff. 6. The patient will be responsible for cost sharing (co-pay) of up to 20% of the service fee (after annual deductible is met). Patient agreed to services and consent obtained.  Patient Care Team: Binnie Rail, MD as PCP - General (Internal Medicine) Caress, Jeneen Rinks, MD (Neurology) Gardiner Barefoot, DPM (Podiatry) Lavonna Monarch, MD (Dermatology) Clarene Essex, MD (Gastroenterology) Warden Fillers, MD as Consulting Physician (Ophthalmology) Charlton Haws, [facility] as Pharmacist (Pharmacist)  Recent office visits: 12/22/20 Dr Quay Burow OV: acute visit for hip/leg pain x 1 week. Given steroid injection, referred to PT.  Recent consult visits: 02/2021 PT for back pain  01/21/21 Dr Vallarie Mare [facility]  neurology): pt stopped Mestinon and slightly worse symptoms, MMT stable however. Advised to take Mestinon as needed.  10/27/20 Dr Ernestina Patches (ortho): given steroid injection for back pain  Hospital visits: None in previous 6 months  Objective:  Lab Results  Component Value Date   CREATININE 1.03 02/06/2020   BUN 15 02/06/2020   GFR 64.38 02/06/2020   GFRNONAA >60 08/24/2010   GFRAA  08/24/2010    >60        The eGFR has been calculated using the MDRD equation. This calculation has not been validated in all clinical situations. eGFR's persistently <60 mL/min signify possible Chronic Kidney Disease.   NA 139 02/06/2020   K 3.8 02/06/2020   CALCIUM 9.6 02/06/2020   CO2 30 02/06/2020    Lab Results  Component Value Date/Time   HGBA1C 5.6 02/06/2020 11:59 AM   HGBA1C 5.8 07/25/2018 11:29 AM   GFR 64.38 02/06/2020 11:59 AM   GFR 63.05 07/25/2018 11:29 AM   MICROALBUR 2.0 (H) 12/23/2016 11:50 AM   MICROALBUR 4.8 (H) 06/16/2015 11:11 AM    Last diabetic Eye exam:  Lab Results  Component Value Date/Time   HMDIABEYEEXA No Retinopathy 06/29/2017 12:00 AM    Last diabetic Foot exam: No results found for: HMDIABFOOTEX   Lab Results  Component Value Date   CHOL 150 02/06/2020   HDL 44.50 02/06/2020   LDLCALC 90 02/06/2020   TRIG 78.0 02/06/2020   CHOLHDL 3 02/06/2020    Hepatic Function Latest Ref Rng & Units 02/06/2020 07/25/2018 01/24/2018  Total Protein 6.0 - 8.3 g/dL 7.6 7.5 7.6  Albumin 3.5 - 5.2 g/dL 4.0 4.1 4.1  AST 0 -  37 U/L '19 22 23  ' ALT 0 - 35 U/L '11 15 18  ' Alk Phosphatase 39 - 117 U/L 73 60 60  Total Bilirubin 0.2 - 1.2 mg/dL 0.5 0.6 0.4  Bilirubin, Direct 0.0 - 0.3 mg/dL - - -    Lab Results  Component Value Date/Time   TSH 0.36 02/06/2020 11:59 AM   TSH 0.39 07/25/2018 11:29 AM   FREET4 0.90 02/11/2011 11:01 AM    CBC Latest Ref Rng & Units 02/06/2020 07/25/2018 12/23/2016  WBC 4.0 - 10.5 K/uL 4.4 5.5 5.7  Hemoglobin 12.0 - 15.0 g/dL 12.3 12.8 12.6   Hematocrit 36.0 - 46.0 % 37.3 38.1 36.7  Platelets 150.0 - 400.0 K/uL 178.0 202.0 252.0    No results found for: VD25OH  Clinical ASCVD: No  The 10-year ASCVD risk score Mikey Bussing DC Jr., et al., 2013) is: 30.6%   Values used to calculate the score:     Age: 49 years     Sex: Female     Is Non-Hispanic African American: Yes     Diabetic: Yes     Tobacco smoker: No     Systolic Blood Pressure: 147 mmHg     Is BP treated: Yes     HDL Cholesterol: 44.5 mg/dL     Total Cholesterol: 150 mg/dL    Depression screen [facility] 2/9 08/19/2020 01/06/2020 08/12/2019  Decreased Interest 0 0 0  Down, Depressed, Hopeless 1 0 0  PHQ - 2 Score 1 0 0     Social History   Tobacco Use  Smoking Status Never Smoker  Smokeless Tobacco Never Used   BP Readings from Last 3 Encounters:  12/22/20 124/70  10/27/20 (!) 158/83  08/20/20 128/72   Pulse Readings from Last 3 Encounters:  12/22/20 (!) 50  10/27/20 (!) 52  08/20/20 (!) 43   Wt Readings from Last 3 Encounters:  12/22/20 167 lb (75.8 kg)  08/20/20 170 lb 6.4 oz (77.3 kg)  07/22/20 164 lb (74.4 kg)    Assessment/Interventions: Review of patient past medical history, allergies, medications, health status, including review of consultants reports, laboratory and other test data, was performed as part of comprehensive evaluation and provision of chronic care management services.   SDOH:  (Social Determinants of Health) assessments and interventions performed: Yes   SDOH Screenings   Alcohol Screen: Low Risk   . Last Alcohol Screening Score (AUDIT): 0  Depression (PHQ2-9): Low Risk   . PHQ-2 Score: 1  Financial Resource Strain: Low Risk   . Difficulty of Paying Living Expenses: Not hard at all  Food Insecurity: No Food Insecurity  . Worried About Charity fundraiser in the Last Year: Never true  . Ran Out of Food in the Last Year: Never true  Housing: Low Risk   . Last Housing Risk Score: 0  Physical Activity: Inactive  . Days of Exercise  per Week: 0 days  . Minutes of Exercise per Session: 0 min  Social Connections: Not on file  Stress: No Stress Concern Present  . Feeling of Stress : Only a little  Tobacco Use: Low Risk   . Smoking Tobacco Use: Never Smoker  . Smokeless Tobacco Use: Never Used  Transportation Needs: No Transportation Needs  . Lack of Transportation (Medical): No  . Lack of Transportation (Non-Medical): No    CCM Care Plan  Allergies  Allergen Reactions  . Vicodin [Hydrocodone-Acetaminophen]     Facial swelling    Medications Reviewed Today    Reviewed  by Charlton Haws, [facility] (Pharmacist) on 02/22/21 at [phone]  Med List Status: <None>  Medication Order Taking? Sig Documenting Provider Last Dose Status Informant  amLODipine (NORVASC) 5 MG tablet [id number] Yes TAKE 1 TABLET(5 MG) BY MOUTH DAILY Burns, Claudina Lick, MD Taking Active   atorvastatin (LIPITOR) 10 MG tablet [id number] Yes TAKE 1 TABLET BY MOUTH DAILY AT 6 PM Burns, Claudina Lick, MD Taking Active   Biotin 10 MG TABS [id number] Yes Take by mouth. [provider] Taking Active   cholecalciferol (VITAMIN D3) 25 MCG (1000 UNIT) tablet [id number] Yes Take 1,000 Units by mouth daily. [provider] Taking Active   diclofenac sodium (VOLTAREN) 1 % GEL [id number] Yes Apply 4 g topically 4 (four) times daily. Binnie Rail, MD Taking Active   gabapentin (NEURONTIN) 300 MG capsule [id number] No TAKE 1 CAPSULE(300 MG) BY MOUTH THREE TIMES DAILY  Patient not taking: Reported on 02/22/2021   Binnie Rail, MD Not Taking Active   levothyroxine (SYNTHROID) 100 MCG tablet [id number] Yes TAKE 1 TABLET BY MOUTH DAILY Burns, Claudina Lick, MD Taking Active   losartan (COZAAR) 100 MG tablet [id number] Yes TAKE 1 TABLET(100 MG) BY MOUTH DAILY Burns, Claudina Lick, MD Taking Active   meloxicam (MOBIC) 15 MG tablet [id number] Yes TAKE 1 TABLET(15 MG) BY MOUTH DAILY  Patient taking differently: Take 15 mg by mouth daily as needed.   Binnie Rail, MD Taking Active    Omega-3 1000 MG CAPS [id number] Yes Take 1 capsule by mouth daily. [provider] Taking Active   pyridostigmine (MESTINON) 60 MG tablet [id number] No Take 60 mg by mouth 3 (three) times daily. Per Neurologist  Patient not taking: Reported on 02/22/2021   [provider] Not Taking Active   ST JOSEPH ASPIRIN PO [id number] Yes Take 81 mg by mouth daily. [provider] Taking Active           Patient Active Problem List   Diagnosis Date Noted  . Bradycardia 08/20/2020  . Acute cervical radiculopathy 07/09/2020  . Pruritus 02/06/2020  . Raynaud's phenomenon 01/06/2020  . Leg cramping 07/25/2018  . Left wrist tendinitis 04/30/2018  . Left-sided low back pain with sciatica 04/30/2018  . Hand numbness 01/24/2018  . Lumbar radiculopathy 12/23/2016  . Plantar fasciitis of right foot 06/22/2016  . Cough 12/19/2014  . Carpal tunnel syndrome of right wrist 03/07/2013  . Myasthenia gravis without exacerbation ([place]) 12/31/2010  . ALOPECIA 12/31/2010  . Hypothyroidism 12/29/2010  . Type 2 diabetes, diet controlled ([place]) 12/29/2010  . Hyperlipidemia 12/29/2010  . Essential hypertension 12/29/2010  . Coronary atherosclerosis 12/29/2010  . OSTEOARTHRITIS 12/29/2010    Immunization History  Administered Date(s) Administered  . Influenza Split 09/05/2012  . Influenza,inj,Quad PF,6+ Mos 11/28/2013, 10/17/2014  . PFIZER(Purple Top)SARS-COV-2 Vaccination 01/23/2020, 02/17/2020  . Pneumococcal Conjugate-13 01/06/2020  . Pneumococcal Polysaccharide-23 09/05/2012  . Td 12/12/2009    Conditions to be addressed/monitored:  Hypertension, Hyperlipidemia, Diabetes, Hypothyroidism, Osteoarthritis and Myasthenia gravis  Care Plan : CCM Pharmacy Care Plan  Updates made by Charlton Haws, [place] since 02/22/2021 12:00 AM    Problem: Hypertension, Hyperlipidemia, Diabetes, Hypothyroidism, Osteoarthritis and Myasthenia gravis   Priority: High    Long-Range Goal: Disease  management   Start Date: 02/22/2021  Expected End Date: 08/25/2021  This Visit's Progress: On track  Priority: High  Note:   Current Barriers:  . Unable to independently monitor therapeutic efficacy . Unable to maintain control of HTN  Pharmacist Clinical Goal(s):  .  Over the next 90 days, patient will achieve adherence to monitoring guidelines and medication adherence to achieve therapeutic efficacy . maintain control of HTN as evidenced by home BP readings  through collaboration with PharmD and provider.   Interventions: . 1:1 collaboration with Binnie Rail, MD regarding development and update of comprehensive plan of care as evidenced by provider attestation and co-signature . Inter-disciplinary care team collaboration (see longitudinal plan of care) . Comprehensive medication review performed; medication list updated in electronic medical record  Hypertension (BP goal <130/80) -Not ideally controlled - per pt home BP has been elevated lately -Current treatment: . Amlodipine 5 mg daily lunch . Losartan 100 mg daily lunch -Current home readings: 140-150/70 (AM) -Denies hypotensive/hypertensive symptoms -Educated on BP goals and benefits of medications for prevention of heart attack, stroke and kidney damage; Daily salt intake goal < 2300 mg; Exercise goal of 150 minutes per week; Importance of home blood pressure monitoring; -Counseled to monitor BP at home daily (varying times), document, and provide log at future appointments -Recommended to move BP medications to bedtime  Hyperlipidemia: (LDL goal < 100) -hx coronary atherosclerosis -Controlled - LDL 1 year ago was at goal -Current treatment: . Atorvastatin 10 mg daily . Aspirin 81 daily  . OTC Omega-3 Fish Oil -Current dietary patterns: tries to limit meat, fried foods; eats salads, shrimp, pasta -Current exercise habits: workout 3 days/week (walking, stretching) -Educated on Cholesterol goals;  Benefits of statin  for ASCVD risk reduction; Exercise goal of 150 minutes per week; -Recommended to continue current medication  Diabetes (A1c goal <7%) -Diet-controlled -Current home glucose readings: not checking -Denies hypoglycemic/hyperglycemic symptoms -Educated on Exercise goal of 150 minutes per week; -Counseled on diet and exercise extensively  Myesthenia gravis (Goal: manage symptoms) -Controlled - per patient and neurology notes. She reports gabapentin is not very helpful for nerve pain -Current treatment: . Gabapentin 300 mg TID prn - not taking . Pyridostigmine 60 mg TID prn - not taking -Counseled on range of gabapentin doses 100 - 3600 mg per day; pt is currently satisfied with symptom management without medication -Recommended to continue current medication  Hypothyroidism (Goal: TSH in target range) -Controlled - TSH has been low end of normal range past 3 years; it is possible TSH is too low and contributing to elevated BP -Current treatment  . Levothyroxine 100 mcg daily - pt has been taking with breakfast -Recommended to continue current medication - pt has PCP visit tomorrow and TSH will be checked again, will defer management to PCP  Osteoarthritis (Goal: manage pain) -Controlled - pt reports using meloxicam less than daily -Current treatment  . Diclofenac 1% gel PRN . Meloxicam 15 mg daily PRN (back pain, joint pain) . Aleve PRN -Counseled on interaction between Aleve and meloxicam; advised to use one or the other -Recommended to continue current medication  Health Maintenance -Vaccine gaps: TDAP, flu, covid booster -Current therapy:  Marland Kitchen Vitamin D3  . Multivitamin  . Vitamin E  . Biotin  -Patient is satisfied with current therapy and denies issues  -Discussed likelihood of Vitamin E deficiency - pt is looking to cut down on pills, she may stop taking Vitamin E -Recommended to continue current medication  Patient Goals/Self-Care Activities . Over the next 90 days,  patient will:  - take medications as prescribed focus on medication adherence by routine check blood pressure daily (varying times), document, and provide at future appointments target a minimum of 150 minutes of moderate intensity exercise weekly  -take  all medications at bedtime except levothyroxine (take in AM on empty stomach)  Follow Up Plan: Telephone follow up appointment with care management team member scheduled for: 3 months      Medication Assistance: None required.  Patient affirms current coverage meets needs.  Patient's preferred pharmacy is:  [id number] [address], [address] [place] [address] Phone: [phone] Fax: [address], [place] [address] Phone: [phone] Fax: [phone]  Uses pill box? No - prefers bottles Pt endorses 100% compliance  We discussed: Current pharmacy is preferred with insurance plan and patient is satisfied with pharmacy services Patient decided to: Continue current medication management strategy  Care Plan and Follow Up Patient Decision:  Patient agrees to Care Plan and Follow-up.  Plan: Telephone follow up appointment with care management team member scheduled for:  3 months  Charlene Brooke, PharmD, [facility] Clinical Pharmacist [facility] at [facility] [phone]

## 2021-02-22 NOTE — Patient Instructions (Signed)
Visit Information  Phone number for Pharmacist: [phone]  Thank you for meeting with me to discuss your medications! I look forward to working with you to achieve your health care goals. Below is a summary of what we talked about during the visit:  Goals Addressed            This Visit's Progress   . Manage My Medicine       Timeframe:  Long-Range Goal Priority:  Medium Start Date:          02/22/21                   Expected End Date:       08/25/21                Follow Up Date 06/10/21   - call for medicine refill 2 or 3 days before it runs out - call if I am sick and can't take my medicine - keep a list of all the medicines I take; vitamins and herbals too  -take all medications at bedtime except levothyroxine (take in AM on empty stomach)  Why is this important?   . These steps will help you keep on track with your medicines.   Notes:       Patient Care Plan: CCM Pharmacy Care Plan    Problem Identified: Hypertension, Hyperlipidemia, Diabetes, Hypothyroidism, Osteoarthritis and Myasthenia gravis   Priority: High    Long-Range Goal: Disease management   Start Date: 02/22/2021  Expected End Date: 08/25/2021  This Visit's Progress: On track  Priority: High  Note:   Current Barriers:  . Unable to independently monitor therapeutic efficacy . Unable to maintain control of HTN  Pharmacist Clinical Goal(s):  Marland Kitchen Over the next 90 days, patient will achieve adherence to monitoring guidelines and medication adherence to achieve therapeutic efficacy . maintain control of HTN as evidenced by home BP readings  through collaboration with PharmD and provider.   Interventions: . 1:1 collaboration with Pincus Sanes, MD regarding development and update of comprehensive plan of care as evidenced by provider attestation and co-signature . Inter-disciplinary care team collaboration (see longitudinal plan of care) . Comprehensive medication review performed; medication list updated  in electronic medical record  Hypertension (BP goal <130/80) -Not ideally controlled - per pt home BP has been elevated lately -Current treatment: . Amlodipine 5 mg daily lunch . Losartan 100 mg daily lunch -Current home readings: 140-150/70 (AM) -Denies hypotensive/hypertensive symptoms -Educated on BP goals and benefits of medications for prevention of heart attack, stroke and kidney damage; Daily salt intake goal < 2300 mg; Exercise goal of 150 minutes per week; Importance of home blood pressure monitoring; -Counseled to monitor BP at home daily (varying times), document, and provide log at future appointments -Recommended to move BP medications to bedtime  Hyperlipidemia: (LDL goal < 100) -hx coronary atherosclerosis -Controlled - LDL 1 year ago was at goal -Current treatment: . Atorvastatin 10 mg daily . Aspirin 81 daily  . OTC Omega-3 Fish Oil -Current dietary patterns: tries to limit meat, fried foods; eats salads, shrimp, pasta -Current exercise habits: workout 3 days/week (walking, stretching) -Educated on Cholesterol goals;  Benefits of statin for ASCVD risk reduction; Exercise goal of 150 minutes per week; -Recommended to continue current medication  Diabetes (A1c goal <7%) -Diet-controlled -Current home glucose readings: not checking -Denies hypoglycemic/hyperglycemic symptoms -Educated on Exercise goal of 150 minutes per week; -Counseled on diet and exercise extensively  Myesthenia gravis (Goal: manage symptoms) -  Controlled - per patient and neurology notes. She reports gabapentin is not very helpful for nerve pain -Current treatment: . Gabapentin 300 mg TID prn - not taking . Pyridostigmine 60 mg TID prn - not taking -Counseled on range of gabapentin doses 100 - 3600 mg per day; pt is currently satisfied with symptom management without medication -Recommended to continue current medication  Hypothyroidism (Goal: TSH in target range) -Controlled - TSH has  been low end of normal range past 3 years; it is possible TSH is too low and contributing to elevated BP -Current treatment  . Levothyroxine 100 mcg daily - pt has been taking with breakfast -Recommended to continue current medication - pt has PCP visit tomorrow and TSH will be checked again, will defer management to PCP  Osteoarthritis (Goal: manage pain) -Controlled - pt reports using meloxicam less than daily -Current treatment  . Diclofenac 1% gel PRN . Meloxicam 15 mg daily PRN (back pain, joint pain) . Aleve PRN -Counseled on interaction between Aleve and meloxicam; advised to use one or the other -Recommended to continue current medication  Health Maintenance -Vaccine gaps: TDAP, flu, covid booster -Current therapy:  Marland Kitchen Vitamin D3  . Multivitamin  . Vitamin E  . Biotin  -Patient is satisfied with current therapy and denies issues  -Discussed likelihood of Vitamin E deficiency - pt is looking to cut down on pills, she may stop taking Vitamin E -Recommended to continue current medication  Patient Goals/Self-Care Activities . Over the next 90 days, patient will:  - take medications as prescribed focus on medication adherence by routine check blood pressure daily (varying times), document, and provide at future appointments target a minimum of 150 minutes of moderate intensity exercise weekly  -take all medications at bedtime except levothyroxine (take in AM on empty stomach)  Follow Up Plan: Telephone follow up appointment with care management team member scheduled for: 3 months      Ms. Hainer was given information about Chronic Care Management services today including:  1. CCM service includes personalized support from designated clinical staff supervised by her physician, including individualized plan of care and coordination with other care providers 2. 24/7 contact phone numbers for assistance for urgent and routine care needs. 3. Standard insurance, coinsurance, copays  and deductibles apply for chronic care management only during months in which we provide at least 20 minutes of these services. Most insurances cover these services at 100%, however patients may be responsible for any copay, coinsurance and/or deductible if applicable. This service may help you avoid the need for more expensive face-to-face services. 4. Only one practitioner may furnish and bill the service in a calendar month. 5. The patient may stop CCM services at any time (effective at the end of the month) by phone call to the office staff.  Patient agreed to services and verbal consent obtained.   The patient verbalized understanding of instructions, educational materials, and care plan provided today and agreed to receive a mailed copy of patient instructions, educational materials, and care plan.  Telephone follow up appointment with pharmacy team member scheduled for: 3 months  Al Corpus, PharmD, [facility] Clinical Pharmacist [facility] [phone]

## 2021-02-23 ENCOUNTER — Encounter: Payer: Self-pay | Admitting: Internal Medicine

## 2021-02-23 ENCOUNTER — Ambulatory Visit (INDEPENDENT_AMBULATORY_CARE_PROVIDER_SITE_OTHER): Payer: Medicare HMO | Admitting: Internal Medicine

## 2021-02-23 VITALS — BP 128/80 | HR 62 | Temp 98.0°F | Ht 66.0 in | Wt 164.0 lb

## 2021-02-23 DIAGNOSIS — E039 Hypothyroidism, unspecified: Secondary | ICD-10-CM

## 2021-02-23 DIAGNOSIS — E7849 Other hyperlipidemia: Secondary | ICD-10-CM

## 2021-02-23 DIAGNOSIS — I10 Essential (primary) hypertension: Secondary | ICD-10-CM | POA: Diagnosis not present

## 2021-02-23 DIAGNOSIS — E119 Type 2 diabetes mellitus without complications: Secondary | ICD-10-CM | POA: Diagnosis not present

## 2021-02-23 DIAGNOSIS — G7000 Myasthenia gravis without (acute) exacerbation: Secondary | ICD-10-CM

## 2021-02-23 DIAGNOSIS — M5416 Radiculopathy, lumbar region: Secondary | ICD-10-CM | POA: Diagnosis not present

## 2021-02-23 LAB — CBC WITH DIFFERENTIAL/PLATELET
Basophils Absolute: 0 10*3/uL (ref 0.0–0.1)
Basophils Relative: 0.5 % (ref 0.0–3.0)
Eosinophils Absolute: 0.4 10*3/uL (ref 0.0–0.7)
Eosinophils Relative: 7.1 % — ABNORMAL HIGH (ref 0.0–5.0)
HCT: 38.7 % (ref 36.0–46.0)
Hemoglobin: 12.9 g/dL (ref 12.0–15.0)
Lymphocytes Relative: 26 % (ref 12.0–46.0)
Lymphs Abs: 1.3 10*3/uL (ref 0.7–4.0)
MCHC: 33.3 g/dL (ref 30.0–36.0)
MCV: 94.7 fl (ref 78.0–100.0)
Monocytes Absolute: 0.6 10*3/uL (ref 0.1–1.0)
Monocytes Relative: 12.3 % — ABNORMAL HIGH (ref 3.0–12.0)
Neutro Abs: 2.7 10*3/uL (ref 1.4–7.7)
Neutrophils Relative %: 54.1 % (ref 43.0–77.0)
Platelets: 174 10*3/uL (ref 150.0–400.0)
RBC: 4.09 Mil/uL (ref 3.87–5.11)
RDW: 13.2 % (ref 11.5–15.5)
WBC: 5.1 10*3/uL (ref 4.0–10.5)

## 2021-02-23 LAB — COMPREHENSIVE METABOLIC PANEL
ALT: 16 U/L (ref 0–35)
AST: 24 U/L (ref 0–37)
Albumin: 4 g/dL (ref 3.5–5.2)
Alkaline Phosphatase: 59 U/L (ref 39–117)
BUN: 19 mg/dL (ref 6–23)
CO2: 31 mEq/L (ref 19–32)
Calcium: 9.8 mg/dL (ref 8.4–10.5)
Chloride: 103 mEq/L (ref 96–112)
Creatinine, Ser: 1.16 mg/dL (ref 0.40–1.20)
GFR: 48.08 mL/min — ABNORMAL LOW (ref >60.00)
Glucose, Bld: 84 mg/dL (ref 70–99)
Potassium: 4 mEq/L (ref 3.5–5.1)
Sodium: 139 mEq/L (ref 135–145)
Total Bilirubin: 0.5 mg/dL (ref 0.2–1.2)
Total Protein: 7.4 g/dL (ref 6.0–8.3)

## 2021-02-23 LAB — LIPID PANEL
Cholesterol: 148 mg/dL (ref 0–200)
HDL: 56.6 mg/dL (ref >39.00)
LDL Cholesterol: 76 mg/dL (ref 0–99)
NonHDL: 91.63
Total CHOL/HDL Ratio: 3
Triglycerides: 76 mg/dL (ref 0.0–149.0)
VLDL: 15.2 mg/dL (ref 0.0–40.0)

## 2021-02-23 LAB — HEMOGLOBIN A1C: Hgb A1c MFr Bld: 5.8 % (ref 4.6–6.5)

## 2021-02-23 LAB — TSH: TSH: 0.41 u[IU]/mL (ref 0.35–4.50)

## 2021-02-23 NOTE — Assessment & Plan Note (Signed)
Chronic BP well controlled Continue amlodipine 5 mg qd, losartan 100 mg qd cmp  

## 2021-02-23 NOTE — Assessment & Plan Note (Signed)
Chronic Well controlled  Taking mestinon 60 mg TID

## 2021-02-23 NOTE — Assessment & Plan Note (Signed)
Doing PT Taking meloxicam 15 mg daily as needed Taking gabapentin as needed  Continue above

## 2021-02-23 NOTE — Patient Instructions (Addendum)
  Blood work was ordered.       Medications changes include :   None     Please followup in 6 months   

## 2021-02-23 NOTE — Assessment & Plan Note (Signed)
Chronic Check lipid panel  Continue atorvastatin 10 mg daily Regular exercise and healthy diet encouraged  

## 2021-02-23 NOTE — Addendum Note (Signed)
Addended by: Waldemar Dickens B on: 02/23/2021 03:03 PM   Modules accepted: Orders

## 2021-02-23 NOTE — Assessment & Plan Note (Signed)
Chronic Diet controlled a1c today Stressed regular exercise and compliance diabetic diet

## 2021-02-23 NOTE — Progress Notes (Signed)
Subjective:    Patient ID: Gloria [name], female    DOB: [birth year], 70 y.o.   MRN: [id number]  HPI The patient is here for follow up of their chronic medical problems, including htn, hypothyroidism, hyperlipidemia, DM, MG, lumbar radiculopathy   The back is doing ok - she is doing PT and it is helping.     Medications and allergies reviewed with patient and updated if appropriate.  Patient Active Problem List   Diagnosis Date Noted  . Bradycardia 08/20/2020  . Acute cervical radiculopathy 07/09/2020  . Pruritus 02/06/2020  . Raynaud's phenomenon 01/06/2020  . Leg cramping 07/25/2018  . Left wrist tendinitis 04/30/2018  . Left-sided low back pain with sciatica 04/30/2018  . Hand numbness 01/24/2018  . Lumbar radiculopathy 12/23/2016  . Plantar fasciitis of right foot 06/22/2016  . Cough 12/19/2014  . Carpal tunnel syndrome of right wrist 03/07/2013  . Myasthenia gravis without exacerbation (HCC) 12/31/2010  . ALOPECIA 12/31/2010  . Hypothyroidism 12/29/2010  . Type 2 diabetes, diet controlled (HCC) 12/29/2010  . Hyperlipidemia 12/29/2010  . Essential hypertension 12/29/2010  . Coronary atherosclerosis 12/29/2010  . OSTEOARTHRITIS 12/29/2010    Current Outpatient Medications on File Prior to Visit  Medication Sig Dispense Refill  . amLODipine (NORVASC) 5 MG tablet TAKE 1 TABLET(5 MG) BY MOUTH DAILY 90 tablet 1  . atorvastatin (LIPITOR) 10 MG tablet TAKE 1 TABLET BY MOUTH DAILY AT 6 PM 90 tablet 0  . Biotin 10 MG TABS Take by mouth.    . cholecalciferol (VITAMIN D3) 25 MCG (1000 UNIT) tablet Take 1,000 Units by mouth daily.    . diclofenac sodium (VOLTAREN) 1 % GEL Apply 4 g topically 4 (four) times daily. 100 g 5  . gabapentin (NEURONTIN) 300 MG capsule TAKE 1 CAPSULE(300 MG) BY MOUTH THREE TIMES DAILY 90 capsule 2  . levothyroxine (SYNTHROID) 100 MCG tablet TAKE 1 TABLET BY MOUTH DAILY 90 tablet 0  . losartan (COZAAR) 100 MG tablet TAKE 1 TABLET(100 MG) BY  MOUTH DAILY 90 tablet 1  . meloxicam (MOBIC) 15 MG tablet TAKE 1 TABLET(15 MG) BY MOUTH DAILY (Patient taking differently: Take 15 mg by mouth daily as needed.) 90 tablet 1  . Omega-3 1000 MG CAPS Take 1 capsule by mouth daily.    Marland Kitchen pyridostigmine (MESTINON) 60 MG tablet Take 60 mg by mouth 3 (three) times daily. Per Neurologist    . ST JOSEPH ASPIRIN PO Take 81 mg by mouth daily.     No current facility-administered medications on file prior to visit.    Past Medical History:  Diagnosis Date  . Alopecia areata 10/2010 onset  . Diabetes mellitus, type 2 (HCC)   . Dyslipidemia   . Hypertension   . Hypothyroid   . Myasthenia gravis     Past Surgical History:  Procedure Laterality Date  . ABDOMINAL HYSTERECTOMY  1990    Social History   Socioeconomic History  . Marital status: Divorced    Spouse name: Not on file  . Number of children: 3  . Years of education: Not on file  . Highest education level: Not on file  Occupational History  . Occupation: retired  Tobacco Use  . Smoking status: Never Smoker  . Smokeless tobacco: Never Used  Vaping Use  . Vaping Use: Never used  Substance and Sexual Activity  . Alcohol use: No  . Drug use: Never  . Sexual activity: Never  Other Topics Concern  . Not on file  Social  History Narrative  . Not on file   Social Determinants of Health   Financial Resource Strain: Low Risk   . Difficulty of Paying Living Expenses: Not hard at all  Food Insecurity: No Food Insecurity  . Worried About Programme researcher, broadcasting/film/video in the Last Year: Never true  . Ran Out of Food in the Last Year: Never true  Transportation Needs: No Transportation Needs  . Lack of Transportation (Medical): No  . Lack of Transportation (Non-Medical): No  Physical Activity: Inactive  . Days of Exercise per Week: 0 days  . Minutes of Exercise per Session: 0 min  Stress: No Stress Concern Present  . Feeling of Stress : Only a little  Social Connections: Not on file     Family History  Adopted: Yes  Problem Relation Age of Onset  . Breast cancer Neg Hx     Review of Systems  Constitutional: Negative for chills and fever.  Respiratory: Negative for cough, shortness of breath and wheezing.   Cardiovascular: Negative for chest pain, palpitations and leg swelling.  Musculoskeletal: Positive for back pain.  Neurological: Negative for light-headedness and headaches.       Equilibrium off a little when she first wakes  - does not last long       Objective:   Vitals:   02/23/21 1428  BP: 128/80  Pulse: 62  Temp: 98 F (36.7 C)  SpO2: 98%   BP Readings from Last 3 Encounters:  02/23/21 128/80  12/22/20 124/70  10/27/20 (!) 158/83   Wt Readings from Last 3 Encounters:  02/23/21 164 lb (74.4 kg)  12/22/20 167 lb (75.8 kg)  08/20/20 170 lb 6.4 oz (77.3 kg)   Body mass index is 26.47 kg/m.   Physical Exam    Constitutional: Appears well-developed and well-nourished. No distress.  HENT:  Head: Normocephalic and atraumatic.  Neck: Neck supple. No tracheal deviation present. No thyromegaly present.  No cervical lymphadenopathy Cardiovascular: Normal rate, regular rhythm and normal heart sounds.   No murmur heard. No carotid bruit .  No edema Pulmonary/Chest: Effort normal and breath sounds normal. No respiratory distress. No has no wheezes. No rales.  Skin: Skin is warm and dry. Not diaphoretic.  Psychiatric: Normal mood and affect. Behavior is normal.      Assessment & Plan:    See Problem List for Assessment and Plan of chronic medical problems.    This visit occurred during the SARS-CoV-2 public health emergency.  Safety protocols were in place, including screening questions prior to the visit, additional usage of staff PPE, and extensive cleaning of exam room while observing appropriate contact time as indicated for disinfecting solutions.

## 2021-02-23 NOTE — Assessment & Plan Note (Signed)
Chronic  Clinically euthyroid Currently taking levothyroxine 100 mcg daily Check tsh  Titrate med dose if needed  

## 2021-02-25 ENCOUNTER — Other Ambulatory Visit: Payer: Self-pay

## 2021-02-25 ENCOUNTER — Encounter: Payer: Self-pay | Admitting: Physical Therapy

## 2021-02-25 ENCOUNTER — Ambulatory Visit: Payer: Medicare HMO | Admitting: Physical Therapy

## 2021-02-25 DIAGNOSIS — M5416 Radiculopathy, lumbar region: Secondary | ICD-10-CM | POA: Diagnosis not present

## 2021-02-25 DIAGNOSIS — R262 Difficulty in walking, not elsewhere classified: Secondary | ICD-10-CM | POA: Diagnosis not present

## 2021-02-25 DIAGNOSIS — M6281 Muscle weakness (generalized): Secondary | ICD-10-CM

## 2021-02-25 DIAGNOSIS — R252 Cramp and spasm: Secondary | ICD-10-CM

## 2021-02-25 NOTE — Therapy (Addendum)
[facility] Outpatient Rehabilitation [facility] [address] [place], [place], [address] Phone: [phone]   Fax:  [phone]  Physical Therapy Treatment  Patient Details  Name: Gloria [name] MRN: [id number] Date of Birth: 1951-08-09 Referring Provider (PT): Cheryll Cockayne MD   Encounter Date: 02/25/2021   PT End of Session - 02/25/21 1533     Visit Number 12    Number of Visits 17    Date for PT Re-Evaluation 04/01/21    Authorization Type [employer] MCR    PT Start Time 1531    PT Stop Time 1625    PT Time Calculation (min) 54 min    Activity Tolerance Patient tolerated treatment well    Behavior During Therapy [facility] for tasks assessed/performed             Past Medical History:  Diagnosis Date   Alopecia areata 10/2010 onset   Diabetes mellitus, type 2 (HCC)    Dyslipidemia    Hypertension    Hypothyroid    Myasthenia gravis     Past Surgical History:  Procedure Laterality Date   ABDOMINAL HYSTERECTOMY  1990    There were no vitals filed for this visit.   Subjective Assessment - 02/25/21 1541     Subjective I walked 3 laps at the track today. I was sore after it but I felt good. My doctors appointment for my hypertension went well, my blood pressure was good. It's amazing what exercise can do for your blood pressure and vitals. My achilles tendon has been hurting lately over the past month.    Currently in Pain? Yes    Pain Score 5     Pain Location Back    Pain Orientation Lower    Pain Descriptors / Indicators Nagging                                               [facility] Adult PT Treatment/Exercise - 02/25/21 0001       Lumbar Exercises: Stretches   Other Lumbar Stretch Exercise calf stretch 2x30 seconds      Lumbar Exercises: Aerobic   Nustep L4 x 5 min LE      Lumbar Exercises: Standing   Heel Raises 10 reps    Row 10 reps    Theraband Level (Row) Level 2 (Red)    Shoulder Extension 10 reps;Strengthening     Theraband Level (Shoulder Extension) Level 2 (Red)    Other Standing Lumbar Exercises goblet squat 10 # x10; deadlift x12 with 10# to 8" box, no knee valgus noted this time; paloff press x10 red,    Other Standing Lumbar Exercises sit to stand x10 10#, forward lunges x10, hip extension and abduction 3#; palloff press red x10; arms straight, turn, pull, push, arms straight turn back yellow x6      Moist Heat Therapy   Number Minutes Moist Heat 10 Minutes    Moist Heat Location Lumbar Spine      Manual Therapy   Manual therapy comments LAD bilateral LE                          PT Education - 02/25/21 1615     Education Details continue HEP at home              PT Short Term Goals -  02/16/21 0905       PT SHORT TERM GOAL #1   Title Independent with intial HEP    Baseline able to be independent with exercises in the AM and at PM    Time 3    Period Weeks    Status Achieved    Target Date 02/02/21      PT SHORT TERM GOAL #2   Title Pt unable to sleep without pain with any movement    Baseline I am able to sleep up to sleep 5 hours now    Time 3    Period Weeks    Status Achieved      PT SHORT TERM GOAL #3   Title Pt unable to dress self without increased pain ( putting on shoes and socks)    Baseline pt able to get R ankle up on left knee. Pt able to dress with socks and shoes but still painful on R >L    Time 3    Period Weeks    Status Achieved                PT Long Term Goals - 02/19/21 0931       PT LONG TERM GOAL #1   Title Independent with advanced HEP as given including proper use of gym equimpment    Baseline beginning to instruct for going back to the gym iniital strength with hip hinge, deadlift and squat    Time 6    Period Weeks    Status On-going    Target Date 04/01/21      PT LONG TERM GOAL #2   Title Improve FOTO intake from 28% to at least 48%    Baseline 40% on 02-11-21    Time 6    Period Weeks    Status On-going     Target Date 04/01/21      PT LONG TERM GOAL #3   Title Increase trunk flexion AROM at least 20 deg to improve ability for bending for chores and activities such as donning shoes    Baseline Pt able to bend forward 70 degrees   able to touch shins also able to sit and bring ankle to knee, does complain about pain but able to do it    Time 6    Period Weeks    Status Achieved    Target Date 04/01/21      PT LONG TERM GOAL #4   Title Pt will be able to stand and lift items overhead to top shelf in kitchen without exacerbating back pain    Baseline Pt reports continued to have back pain lifting overhead    Time 6    Period Weeks    Status On-going    Target Date 04/01/21      PT LONG TERM GOAL #5   Title Pt will improve L/R hip ext and hip abd / knee flex/ext  strength to >/= 4+/5 with </= without exacerbation of back pain to promote correct use of gym equipment and promote injury prevention    Baseline Strength assessed 02/16/21, pt continues to have deficits with hip and knee strength    Time 6    Period Weeks    Status On-going    Target Date 04/01/21      PT LONG TERM GOAL #6   Title Pt will be able to demo lifting groceries from floor to show proper technique and practice injury prevention with household chores    Baseline  Pt progressing with deadlifting from elevated surface    Time 6    Period Weeks    Status On-going    Target Date 04/01/21                        Plan - 02/25/21 1642     Clinical Impression Statement Pt tolerated tx well with no adverse effects. Pt further moving faster today and shows more enthusiasm with exercises. She is returning to walking for working out per report without any pain. Session focused on further progressing CKC exercises in order to strengthen LE/posterior chain. Further theraband exercises were progressed/introduced in order to help with core stablization. Continues to benefit from skilled PT in order to work on posterior  chain/core strengthening, LE exercises, and progress therapeutic lifting exercises so that pt can return to the gym independently, increase ambulation, and return to PLOF.    PT Treatment/Interventions ADLs/Self Care Home Management;Aquatic Therapy;Cryotherapy;Electrical Stimulation;Moist Heat;Traction;Ultrasound;Gait training;Stair training;Functional mobility training;Therapeutic activities;Therapeutic exercise;Balance training;Neuromuscular re-education;Manual techniques;Patient/family education;Passive range of motion;Dry needling;Taping;Joint Manipulations;Spinal Manipulations    PT Next Visit Plan further load squats, deadlifts, sit to stands, lunges LE ankle weight exercises, continue nustep without UE's    PT Home Exercise Plan [email or web address]    Consulted and Agree with Plan of Care Patient             Patient will benefit from skilled therapeutic intervention in order to improve the following deficits and impairments:     Visit Diagnosis: Radiculopathy, lumbar region  Muscle weakness (generalized)  Difficulty in walking, not elsewhere classified  Cramp and spasm      Problem List Patient Active Problem List   Diagnosis Date Noted   Bradycardia 08/20/2020   Acute cervical radiculopathy 07/09/2020   Pruritus 02/06/2020   Raynaud's phenomenon 01/06/2020   Leg cramping 07/25/2018   Left wrist tendinitis 04/30/2018   Left-sided low back pain with sciatica 04/30/2018   Hand numbness 01/24/2018   Lumbar radiculopathy 12/23/2016   Plantar fasciitis of right foot 06/22/2016   Cough 12/19/2014   Carpal tunnel syndrome of right wrist 03/07/2013   Myasthenia gravis without exacerbation (HCC) 12/31/2010   ALOPECIA 12/31/2010   Hypothyroidism 12/29/2010   Type 2 diabetes, diet controlled (HCC) 12/29/2010   Hyperlipidemia 12/29/2010   Essential hypertension 12/29/2010   Coronary atherosclerosis 12/29/2010   OSTEOARTHRITIS 12/29/2010    Jeri Cos, SPT 02/25/2021, 4:46  PM  [facility] [address] [place], [place], [address] Phone: [phone]   Fax:  [phone]  Name: Geonna Lockyer MRN: [id number] Date of Birth: 09-Jul-1951

## 2021-03-03 ENCOUNTER — Encounter: Payer: Self-pay | Admitting: Physical Therapy

## 2021-03-10 ENCOUNTER — Other Ambulatory Visit: Payer: Self-pay

## 2021-03-10 ENCOUNTER — Ambulatory Visit: Payer: Medicare HMO | Admitting: Physical Therapy

## 2021-03-10 ENCOUNTER — Encounter: Payer: Self-pay | Admitting: Physical Therapy

## 2021-03-10 DIAGNOSIS — M5416 Radiculopathy, lumbar region: Secondary | ICD-10-CM | POA: Diagnosis not present

## 2021-03-10 DIAGNOSIS — R252 Cramp and spasm: Secondary | ICD-10-CM | POA: Diagnosis not present

## 2021-03-10 DIAGNOSIS — M6281 Muscle weakness (generalized): Secondary | ICD-10-CM

## 2021-03-10 DIAGNOSIS — R262 Difficulty in walking, not elsewhere classified: Secondary | ICD-10-CM | POA: Diagnosis not present

## 2021-03-10 NOTE — Therapy (Signed)
[facility] Outpatient Rehabilitation [facility] [address] [place], [place], [address] Phone: [phone]   Fax:  [phone]  Physical Therapy Treatment  Patient Details  Name: Gloria [name] MRN: [id number] Date of Birth: 31-Mar-1951 Referring Provider (PT): Cheryll Cockayne MD   Encounter Date: 03/10/2021   PT End of Session - 03/10/21 1714    Visit Number 13    Number of Visits 17    Date for PT Re-Evaluation 04/01/21    Authorization Type [employer] MCR    PT Start Time 1630    PT Stop Time 1712    PT Time Calculation (min) 42 min    Activity Tolerance Patient tolerated treatment well    Behavior During Therapy [facility] for tasks assessed/performed           Past Medical History:  Diagnosis Date  . Alopecia areata 10/2010 onset  . Diabetes mellitus, type 2 (HCC)   . Dyslipidemia   . Hypertension   . Hypothyroid   . Myasthenia gravis     Past Surgical History:  Procedure Laterality Date  . ABDOMINAL HYSTERECTOMY  1990    There were no vitals filed for this visit.   Subjective Assessment - 03/10/21 1636    Subjective "everything is ok, still having problems with the low back.I think I really need some heat today. I did more walking which I think is what made me sore."    Currently in Pain? Yes    Pain Score 7     Pain Location Back    Pain Orientation Lower              [facility] PT Assessment - 03/10/21 0001      Assessment   Medical Diagnosis Lumbar radiculopathy    Referring Provider (PT) Cheryll Cockayne MD    Onset Date/Surgical Date 12/28/20                         [facility] Adult PT Treatment/Exercise - 03/10/21 0001      Lumbar Exercises: Stretches   Hip Flexor Stretch 2 reps;Right;Left;30 seconds      Lumbar Exercises: Aerobic   Nustep L5 x 5 min UE/LE      Lumbar Exercises: Standing   Row Strengthening;10 reps;Theraband    Theraband Level (Row) Level 2 (Red)    Shoulder Extension 10 reps;Theraband    Theraband  Level (Shoulder Extension) Level 2 (Red)    Other Standing Lumbar Exercises staing bil UE pressiong down into red physioball for core activation 2 x 10    Other Standing Lumbar Exercises TRX squats 2 x 10, TRX hip hing 1 x 10 bil      Manual Therapy   Manual therapy comments self trigger point release using tennis against the wall                    PT Short Term Goals - 02/16/21 0905      PT SHORT TERM GOAL #1   Title Independent with intial HEP    Baseline able to be independent with exercises in the AM and at PM    Time 3    Period Weeks    Status Achieved    Target Date 02/02/21      PT SHORT TERM GOAL #2   Title Pt unable to sleep without pain with any movement    Baseline I am able to sleep up to sleep 5 hours now    Time  3    Period Weeks    Status Achieved      PT SHORT TERM GOAL #3   Title Pt unable to dress self without increased pain ( putting on shoes and socks)    Baseline pt able to get R ankle up on left knee. Pt able to dress with socks and shoes but still painful on R >L    Time 3    Period Weeks    Status Achieved             PT Long Term Goals - 02/19/21 0931      PT LONG TERM GOAL #1   Title Independent with advanced HEP as given including proper use of gym equimpment    Baseline beginning to instruct for going back to the gym iniital strength with hip hinge, deadlift and squat    Time 6    Period Weeks    Status On-going    Target Date 04/01/21      PT LONG TERM GOAL #2   Title Improve FOTO intake from 28% to at least 48%    Baseline 40% on 02-11-21    Time 6    Period Weeks    Status On-going    Target Date 04/01/21      PT LONG TERM GOAL #3   Title Increase trunk flexion AROM at least 20 deg to improve ability for bending for chores and activities such as donning shoes    Baseline Pt able to bend forward 70 degrees   able to touch shins also able to sit and bring ankle to knee, does complain about pain but able to do it    Time  6    Period Weeks    Status Achieved    Target Date 04/01/21      PT LONG TERM GOAL #4   Title Pt will be able to stand and lift items overhead to top shelf in kitchen without exacerbating back pain    Baseline Pt reports continued to have back pain lifting overhead    Time 6    Period Weeks    Status On-going    Target Date 04/01/21      PT LONG TERM GOAL #5   Title Pt will improve L/R hip ext and hip abd / knee flex/ext  strength to >/= 4+/5 with </= without exacerbation of back pain to promote correct use of gym equipment and promote injury prevention    Baseline Strength assessed 02/16/21, pt continues to have deficits with hip and knee strength    Time 6    Period Weeks    Status On-going    Target Date 04/01/21      PT LONG TERM GOAL #6   Title Pt will be able to demo lifting groceries from floor to show proper technique and practice injury prevention with household chores    Baseline Pt progressing with deadlifting from elevated surface    Time 6    Period Weeks    Status On-going    Target Date 04/01/21                 Plan - 03/10/21 1712    Clinical Impression Statement pt reported having increased low back pain which she states she feels is from her doing more walking. Used self trigger point release with tennis ball which she noted some relief, continued working on core, hip and low back strengthening in standing using TRX system. she noted feeling  looser but continued soreness in the low back.    PT Treatment/Interventions ADLs/Self Care Home Management;Aquatic Therapy;Cryotherapy;Electrical Stimulation;Moist Heat;Traction;Ultrasound;Gait training;Stair training;Functional mobility training;Therapeutic activities;Therapeutic exercise;Balance training;Neuromuscular re-education;Manual techniques;Patient/family education;Passive range of motion;Dry needling;Taping;Joint Manipulations;Spinal Manipulations    PT Next Visit Plan further load squats, deadlifts, sit to  stands, lunges LE ankle weight exercises, continue nustep without UE's    Consulted and Agree with Plan of Care Patient           Patient will benefit from skilled therapeutic intervention in order to improve the following deficits and impairments:  Difficulty walking,Decreased mobility,Decreased range of motion,Decreased strength,Hypomobility,Increased muscle spasms,Postural dysfunction,Improper body mechanics,Impaired flexibility,Pain  Visit Diagnosis: Radiculopathy, lumbar region  Muscle weakness (generalized)  Difficulty in walking, not elsewhere classified  Cramp and spasm     Problem List Patient Active Problem List   Diagnosis Date Noted  . Bradycardia 08/20/2020  . Acute cervical radiculopathy 07/09/2020  . Pruritus 02/06/2020  . Raynaud's phenomenon 01/06/2020  . Leg cramping 07/25/2018  . Left wrist tendinitis 04/30/2018  . Left-sided low back pain with sciatica 04/30/2018  . Hand numbness 01/24/2018  . Lumbar radiculopathy 12/23/2016  . Plantar fasciitis of right foot 06/22/2016  . Cough 12/19/2014  . Carpal tunnel syndrome of right wrist 03/07/2013  . Myasthenia gravis without exacerbation (HCC) 12/31/2010  . ALOPECIA 12/31/2010  . Hypothyroidism 12/29/2010  . Type 2 diabetes, diet controlled (HCC) 12/29/2010  . Hyperlipidemia 12/29/2010  . Essential hypertension 12/29/2010  . Coronary atherosclerosis 12/29/2010  . OSTEOARTHRITIS 12/29/2010   Lulu Riding PT, DPT, LAT, ATC  03/10/21  5:18 PM      [facility] Health Outpatient Rehabilitation [facility] [address] [place], [place], [address] Phone: [phone]   Fax:  [phone]  Name: Michella Detjen MRN: [id number] Date of Birth: [birth year]

## 2021-03-16 ENCOUNTER — Other Ambulatory Visit: Payer: Self-pay

## 2021-03-16 ENCOUNTER — Encounter: Payer: Self-pay | Admitting: Physical Therapy

## 2021-03-16 ENCOUNTER — Ambulatory Visit: Payer: Medicare HMO | Attending: Internal Medicine | Admitting: Physical Therapy

## 2021-03-16 DIAGNOSIS — R252 Cramp and spasm: Secondary | ICD-10-CM

## 2021-03-16 DIAGNOSIS — M6281 Muscle weakness (generalized): Secondary | ICD-10-CM | POA: Diagnosis not present

## 2021-03-16 DIAGNOSIS — R262 Difficulty in walking, not elsewhere classified: Secondary | ICD-10-CM

## 2021-03-16 DIAGNOSIS — M5416 Radiculopathy, lumbar region: Secondary | ICD-10-CM | POA: Diagnosis not present

## 2021-03-17 ENCOUNTER — Other Ambulatory Visit: Payer: Self-pay

## 2021-03-17 NOTE — Patient Instructions (Signed)
Access Code: [email or web address] URL: [email or web address] Date: 03/17/2021 Prepared by: Jeri Cos  Exercises Supine Posterior Pelvic Tilt - 2-3 x daily - 7 x weekly - 2 sets - 10 reps - 5 seconds hold Lower Trunk Rotations - 2-3 x daily - 7 x weekly - 1-2 sets - 10 reps - 20 seconds hold Seated Lumbar Flexion Stretch - 5 x daily - 10 reps Supine Figure 4 Piriformis Stretch - 2 x daily - 7 x weekly - 1 sets - 2 reps - 15-30 hold Hooklying Transversus Abdominis Palpation - 1 x daily - 7 x weekly - 1 sets - 10 reps - 5 hold Standing March with Counter Support - 1 x daily - 7 x weekly - 3 sets - 10 reps Sit to Stand with Counter Support - 1 x daily - 7 x weekly - 3 sets - 10 reps Standing Hip Abduction with Counter Support - 1 x daily - 7 x weekly - 3 sets - 10 reps Standing Hip Extension with Counter Support - 1 x daily - 7 x weekly - 3 sets - 10 reps Standing Hip Hinge with Dowel - 1 x daily - 7 x weekly - 10 reps - 3 sets Goblet Squat with Kettlebell - 1 x daily - 7 x weekly - 3 sets - 10 reps Kettlebell Deadlift - 1 x daily - 7 x weekly - 3 sets

## 2021-03-17 NOTE — Therapy (Addendum)
[facility] Outpatient Rehabilitation [facility] [address] [place], [place], [address] Phone: [phone]   Fax:  [phone]  Physical Therapy Treatment  Patient Details  Name: Gloria [name] MRN: [id number] Date of Birth: April 02, 1951 Referring Provider (PT): Cheryll Cockayne MD   Encounter Date: 03/16/2021   PT End of Session - 03/16/21 1703     Visit Number 14    Number of Visits 17    Date for PT Re-Evaluation 04/01/21    Authorization Type [employer] MCR    Progress Note Due on Visit 20    PT Start Time 1617    PT Stop Time 1703    PT Time Calculation (min) 46 min    Activity Tolerance Patient tolerated treatment well    Behavior During Therapy [facility] for tasks assessed/performed             Past Medical History:  Diagnosis Date   Alopecia areata 10/2010 onset   Diabetes mellitus, type 2 (HCC)    Dyslipidemia    Hypertension    Hypothyroid    Myasthenia gravis     Past Surgical History:  Procedure Laterality Date   ABDOMINAL HYSTERECTOMY  1990    There were no vitals filed for this visit.   Subjective Assessment - 03/16/21 1621     Subjective I was sore after last session since I didn't get the leg pull or heating pad.    Currently in Pain? Yes    Pain Score 6     Pain Location Back    Pain Orientation Lower    Pain Descriptors / Indicators Aching    Pain Type Chronic pain    Pain Onset More than a month ago    Pain Frequency Constant                                               [facility] Adult PT Treatment/Exercise - 03/17/21 0001       Self-Care   Self-Care Other Self-Care Comments    Other Self-Care Comments  education on benefit of sitting on physio ball at home for posture and core endurance and education on where to find one      Lumbar Exercises: Aerobic   Nustep L5 x 5 min LE      Lumbar Exercises: Standing   Other Standing Lumbar Exercises TRX squats 2 x 10, TRX hip hing 1 x 10 bil      Lumbar  Exercises: Seated   Other Seated Lumbar Exercises on physio green ball: palloff press red x10, rows 2x12, extension 2x12 with red band    Other Seated Lumbar Exercises sit to stands x10 10#      Manual Therapy   Manual therapy comments self trigger point release using tennis against the wall                          PT Education - 03/17/21 0855     Education Details continue HEP at home but add 8-10# weights with goblet squats and deadlifts, see self care    Person(s) Educated Patient    Methods Explanation;Demonstration;Tactile cues;Verbal cues    Comprehension Verbalized understanding;Returned demonstration;Need further instruction              PT Short Term Goals - 02/16/21 8502       PT  SHORT TERM GOAL #1   Title Independent with intial HEP    Baseline able to be independent with exercises in the AM and at PM    Time 3    Period Weeks    Status Achieved    Target Date 02/02/21      PT SHORT TERM GOAL #2   Title Pt unable to sleep without pain with any movement    Baseline I am able to sleep up to sleep 5 hours now    Time 3    Period Weeks    Status Achieved      PT SHORT TERM GOAL #3   Title Pt unable to dress self without increased pain ( putting on shoes and socks)    Baseline pt able to get R ankle up on left knee. Pt able to dress with socks and shoes but still painful on R >L    Time 3    Period Weeks    Status Achieved                PT Long Term Goals - 02/19/21 0931       PT LONG TERM GOAL #1   Title Independent with advanced HEP as given including proper use of gym equimpment    Baseline beginning to instruct for going back to the gym iniital strength with hip hinge, deadlift and squat    Time 6    Period Weeks    Status On-going    Target Date 04/01/21      PT LONG TERM GOAL #2   Title Improve FOTO intake from 28% to at least 48%    Baseline 40% on 02-11-21    Time 6    Period Weeks    Status On-going    Target Date  04/01/21      PT LONG TERM GOAL #3   Title Increase trunk flexion AROM at least 20 deg to improve ability for bending for chores and activities such as donning shoes    Baseline Pt able to bend forward 70 degrees   able to touch shins also able to sit and bring ankle to knee, does complain about pain but able to do it    Time 6    Period Weeks    Status Achieved    Target Date 04/01/21      PT LONG TERM GOAL #4   Title Pt will be able to stand and lift items overhead to top shelf in kitchen without exacerbating back pain    Baseline Pt reports continued to have back pain lifting overhead    Time 6    Period Weeks    Status On-going    Target Date 04/01/21      PT LONG TERM GOAL #5   Title Pt will improve L/R hip ext and hip abd / knee flex/ext  strength to >/= 4+/5 with </= without exacerbation of back pain to promote correct use of gym equipment and promote injury prevention    Baseline Strength assessed 02/16/21, pt continues to have deficits with hip and knee strength    Time 6    Period Weeks    Status On-going    Target Date 04/01/21      PT LONG TERM GOAL #6   Title Pt will be able to demo lifting groceries from floor to show proper technique and practice injury prevention with household chores    Baseline Pt progressing with deadlifting from elevated surface  Time 6    Period Weeks    Status On-going    Target Date 04/01/21                        Plan - 03/17/21 0856     Clinical Impression Statement Pt tolerated tx well with no adverse effects. Reeducated on tennis ball self TrP release as pt stated she didn't feel like she was doing it correctly at home. Used TrX for core and LE strengthening. Physio ball incorporated with all theraband exercises to promote further core stabilization/strengthening. Continues to benefit from skilled PT in order to promote indepenence with core/LE strengthening so that she can return to the gym and increase ambulation ability.     PT Treatment/Interventions ADLs/Self Care Home Management;Aquatic Therapy;Cryotherapy;Electrical Stimulation;Moist Heat;Traction;Ultrasound;Gait training;Stair training;Functional mobility training;Therapeutic activities;Therapeutic exercise;Balance training;Neuromuscular re-education;Manual techniques;Patient/family education;Passive range of motion;Dry needling;Taping;Joint Manipulations;Spinal Manipulations    PT Next Visit Plan further load squats, deadlifts, sit to stands, lunges LE ankle weight exercises, continue nustep without UE's    PT Home Exercise Plan [email or web address]             Patient will benefit from skilled therapeutic intervention in order to improve the following deficits and impairments:  Difficulty walking,Decreased mobility,Decreased range of motion,Decreased strength,Hypomobility,Increased muscle spasms,Postural dysfunction,Improper body mechanics,Impaired flexibility,Pain  Visit Diagnosis: Radiculopathy, lumbar region  Muscle weakness (generalized)  Difficulty in walking, not elsewhere classified  Cramp and spasm      Problem List Patient Active Problem List   Diagnosis Date Noted   Bradycardia 08/20/2020   Acute cervical radiculopathy 07/09/2020   Pruritus 02/06/2020   Raynaud's phenomenon 01/06/2020   Leg cramping 07/25/2018   Left wrist tendinitis 04/30/2018   Left-sided low back pain with sciatica 04/30/2018   Hand numbness 01/24/2018   Lumbar radiculopathy 12/23/2016   Plantar fasciitis of right foot 06/22/2016   Cough 12/19/2014   Carpal tunnel syndrome of right wrist 03/07/2013   Myasthenia gravis without exacerbation (HCC) 12/31/2010   ALOPECIA 12/31/2010   Hypothyroidism 12/29/2010   Type 2 diabetes, diet controlled (HCC) 12/29/2010   Hyperlipidemia 12/29/2010   Essential hypertension 12/29/2010   Coronary atherosclerosis 12/29/2010   OSTEOARTHRITIS 12/29/2010    Jeri Cos, SPT 03/17/2021, 8:59 AM  [facility] [address] [place], [place], [address] Phone: [phone]   Fax:  [phone]  Name: Gloria [name] MRN: [id number] Date of Birth: 12-Jul-1951

## 2021-03-23 ENCOUNTER — Encounter: Payer: Self-pay | Admitting: Physical Therapy

## 2021-03-23 ENCOUNTER — Ambulatory Visit: Payer: Medicare HMO | Admitting: Physical Therapy

## 2021-03-23 ENCOUNTER — Other Ambulatory Visit: Payer: Self-pay

## 2021-03-23 DIAGNOSIS — M5416 Radiculopathy, lumbar region: Secondary | ICD-10-CM

## 2021-03-23 DIAGNOSIS — M6281 Muscle weakness (generalized): Secondary | ICD-10-CM | POA: Diagnosis not present

## 2021-03-23 DIAGNOSIS — R252 Cramp and spasm: Secondary | ICD-10-CM | POA: Diagnosis not present

## 2021-03-23 DIAGNOSIS — R262 Difficulty in walking, not elsewhere classified: Secondary | ICD-10-CM

## 2021-03-24 NOTE — Therapy (Addendum)
[facility] Outpatient Rehabilitation [facility] [address] [place], [place], [address] Phone: [phone]   Fax:  [phone]  Physical Therapy Treatment  Patient Details  Name: Gloria [name] MRN: [id number] Date of Birth: 02-15-1951 Referring Provider (PT): Cheryll Cockayne MD   Encounter Date: 03/23/2021   PT End of Session - 03/23/21 1624     Visit Number 15    Number of Visits 17    Date for PT Re-Evaluation 04/01/21    Authorization Type [employer] MCR    Progress Note Due on Visit 20    PT Start Time 1615    PT Stop Time 1700    PT Time Calculation (min) 45 min    Activity Tolerance Patient tolerated treatment well    Behavior During Therapy [facility] for tasks assessed/performed             Past Medical History:  Diagnosis Date   Alopecia areata 10/2010 onset   Diabetes mellitus, type 2 (HCC)    Dyslipidemia    Hypertension    Hypothyroid    Myasthenia gravis     Past Surgical History:  Procedure Laterality Date   ABDOMINAL HYSTERECTOMY  1990    There were no vitals filed for this visit.   Subjective Assessment - 03/23/21 1622     Subjective I already did some exercises today. Walking 1/2 mile now, I think my back feels better after I do it.    Currently in Pain? Yes    Pain Score 6    5-6   Pain Location Back    Pain Orientation Lower    Pain Descriptors / Indicators Other (Comment)   stiff   Pain Type Chronic pain    Pain Onset More than a month ago    Pain Frequency Constant                  [facility] PT Assessment - 03/24/21 0001       Observation/Other Assessments   Focus on Therapeutic Outcomes (FOTO)  53% functional status (predicted 48%)                                        [facility] Adult PT Treatment/Exercise - 03/24/21 0001       Self-Care   Self-Care Other Self-Care Comments    Other Self-Care Comments  FOTO discussion, d/c discussion      Lumbar Exercises: Aerobic   Nustep L6 x 5 min LE       Lumbar Exercises: Standing   Other Standing Lumbar Exercises deadlifts x15 10#; deadlifts x10 15#      Lumbar Exercises: Seated   Other Seated Lumbar Exercises on physio green ball: palloff press red 2x12, rows red 2x15    Other Seated Lumbar Exercises sit to stand x10 15#, x12 10#                          PT Education - 03/24/21 0837     Education Details HEP update    Person(s) Educated Patient    Methods Explanation;Demonstration;Handout    Comprehension Verbalized understanding;Returned demonstration              PT Short Term Goals - 02/16/21 0905       PT SHORT TERM GOAL #1   Title Independent with intial HEP    Baseline able to be independent  with exercises in the AM and at PM    Time 3    Period Weeks    Status Achieved    Target Date 02/02/21      PT SHORT TERM GOAL #2   Title Pt unable to sleep without pain with any movement    Baseline I am able to sleep up to sleep 5 hours now    Time 3    Period Weeks    Status Achieved      PT SHORT TERM GOAL #3   Title Pt unable to dress self without increased pain ( putting on shoes and socks)    Baseline pt able to get R ankle up on left knee. Pt able to dress with socks and shoes but still painful on R >L    Time 3    Period Weeks    Status Achieved                PT Long Term Goals - 03/23/21 1709       PT LONG TERM GOAL #1   Title Independent with advanced HEP as given including proper use of gym equimpment    Baseline beginning to instruct for going back to the gym iniital strength with hip hinge, deadlift and squat    Time 6    Period Weeks    Status On-going    Target Date 04/01/21      PT LONG TERM GOAL #2   Title Improve FOTO intake from 28% to at least 48%    Baseline 53% functional status (predicted 48%)    Status Achieved      PT LONG TERM GOAL #3   Title Increase trunk flexion AROM at least 20 deg to improve ability for bending for chores and activities such as donning shoes     Baseline Pt able to bend forward 70 degrees   able to touch shins also able to sit and bring ankle to knee, does complain about pain but able to do it    Time 6    Period Weeks    Status Achieved      PT LONG TERM GOAL #4   Title Pt will be able to stand and lift items overhead to top shelf in kitchen without exacerbating back pain    Baseline Pt reports continued to have back pain lifting overhead    Time 6    Period Weeks    Status On-going    Target Date 04/01/21      PT LONG TERM GOAL #5   Title Pt will improve L/R hip ext and hip abd / knee flex/ext  strength to >/= 4+/5 with </= without exacerbation of back pain to promote correct use of gym equipment and promote injury prevention    Baseline Strength assessed 02/16/21, pt continues to have deficits with hip and knee strength    Time 6    Period Weeks    Status On-going    Target Date 04/01/21      PT LONG TERM GOAL #6   Title Pt will be able to demo lifting groceries from floor to show proper technique and practice injury prevention with household chores    Baseline pt deadlifting 15# from 6" box    Time 6    Period Weeks    Status On-going    Target Date 04/01/21  Plan - 03/23/21 1655     Clinical Impression Statement Pt tolerated tx well with no adverse effects. Further loading of sit to stands, deadlifts, and squats performed this session. FOTO performed thi ssession and pt had exceeded FOTO goal of 48% and is currently now at 53%. Further physio ball core stablization with theraband resistance for postural control performed this session with increased muscular endurance. Pt states that she is in the works of getting a physio ball to do her exercises at home and to increase postural endurance with sitting. Pt agreeable to have 2 more visits until d/c in order to finalize HEP, continue to load LE/core and make sure she is independent in the gym.    PT Treatment/Interventions ADLs/Self Care  Home Management;Aquatic Therapy;Cryotherapy;Electrical Stimulation;Moist Heat;Traction;Ultrasound;Gait training;Stair training;Functional mobility training;Therapeutic activities;Therapeutic exercise;Balance training;Neuromuscular re-education;Manual techniques;Patient/family education;Passive range of motion;Dry needling;Taping;Joint Manipulations;Spinal Manipulations    PT Next Visit Plan further load squats, deadlifts, sit to stands, lunges LE ankle weight exercises, continue nustep without UE's    PT Home Exercise Plan [email or web address]    Consulted and Agree with Plan of Care Patient             Patient will benefit from skilled therapeutic intervention in order to improve the following deficits and impairments:  Difficulty walking,Decreased mobility,Decreased range of motion,Decreased strength,Hypomobility,Increased muscle spasms,Postural dysfunction,Improper body mechanics,Impaired flexibility,Pain  Visit Diagnosis: Radiculopathy, lumbar region  Muscle weakness (generalized)  Difficulty in walking, not elsewhere classified  Cramp and spasm      Problem List Patient Active Problem List   Diagnosis Date Noted   Bradycardia 08/20/2020   Acute cervical radiculopathy 07/09/2020   Pruritus 02/06/2020   Raynaud's phenomenon 01/06/2020   Leg cramping 07/25/2018   Left wrist tendinitis 04/30/2018   Left-sided low back pain with sciatica 04/30/2018   Hand numbness 01/24/2018   Lumbar radiculopathy 12/23/2016   Plantar fasciitis of right foot 06/22/2016   Cough 12/19/2014   Carpal tunnel syndrome of right wrist 03/07/2013   Myasthenia gravis without exacerbation (HCC) 12/31/2010   ALOPECIA 12/31/2010   Hypothyroidism 12/29/2010   Type 2 diabetes, diet controlled (HCC) 12/29/2010   Hyperlipidemia 12/29/2010   Essential hypertension 12/29/2010   Coronary atherosclerosis 12/29/2010   OSTEOARTHRITIS 12/29/2010    Jeri Cos, SPT 03/24/2021, 12:55 PM  [facility] Outpatient  Rehabilitation [facility] [address] [place], [place], [address] Phone: [phone]   Fax:  [phone]  Name: Gloria [name] MRN: [id number] Date of Birth: [birth year]

## 2021-03-24 NOTE — Patient Instructions (Signed)
Access Code: [email or web address] URL: [email or web address] Date: 03/24/2021 Prepared by: Jeri Cos  Exercises Supine Posterior Pelvic Tilt - 2-3 x daily - 7 x weekly - 2 sets - 10 reps - 5 seconds hold Lower Trunk Rotations - 2-3 x daily - 7 x weekly - 1-2 sets - 10 reps - 20 seconds hold Seated Lumbar Flexion Stretch - 5 x daily - 10 reps Supine Figure 4 Piriformis Stretch - 2 x daily - 7 x weekly - 1 sets - 2 reps - 15-30 hold Hooklying Transversus Abdominis Palpation - 1 x daily - 7 x weekly - 1 sets - 10 reps - 5 hold Standing March with Counter Support - 1 x daily - 7 x weekly - 3 sets - 10 reps Standing Hip Abduction with Counter Support - 1 x daily - 7 x weekly - 3 sets - 10 reps Standing Hip Extension with Counter Support - 1 x daily - 7 x weekly - 3 sets - 10 reps Standing Hip Hinge with Dowel - 1 x daily - 7 x weekly - 10 reps - 3 sets Goblet Squat with Kettlebell - 1 x daily - 7 x weekly - 3 sets - 10 reps Kettlebell Deadlift - 1 x daily - 7 x weekly - 3 sets Standing Row with Anchored Resistance - 1 x daily - 7 x weekly - 2-3 sets - 10 reps Standing Anti-Rotation Press with Anchored Resistance - 1 x daily - 7 x weekly - 2-3 sets - 10 reps Shoulder Extension with Resistance - 1 x daily - 7 x weekly - 2-3 sets - 10 reps Sit to Stand Without Arm Support - 1 x daily - 7 x weekly - 2-3 sets - 10 reps

## 2021-03-30 ENCOUNTER — Ambulatory Visit: Payer: Medicare HMO | Admitting: Physical Therapy

## 2021-03-30 ENCOUNTER — Other Ambulatory Visit: Payer: Self-pay

## 2021-03-30 ENCOUNTER — Encounter: Payer: Self-pay | Admitting: Physical Therapy

## 2021-03-30 DIAGNOSIS — R262 Difficulty in walking, not elsewhere classified: Secondary | ICD-10-CM | POA: Diagnosis not present

## 2021-03-30 DIAGNOSIS — M6281 Muscle weakness (generalized): Secondary | ICD-10-CM

## 2021-03-30 DIAGNOSIS — R252 Cramp and spasm: Secondary | ICD-10-CM | POA: Diagnosis not present

## 2021-03-30 DIAGNOSIS — M5416 Radiculopathy, lumbar region: Secondary | ICD-10-CM

## 2021-03-30 NOTE — Patient Instructions (Signed)
Access Code: [email or web address] URL: [email or web address] Date: 03/30/2021 Prepared by: Jeri Cos  Exercises Supine Posterior Pelvic Tilt - 2-3 x daily - 7 x weekly - 2 sets - 10 reps - 5 seconds hold Lower Trunk Rotations - 2-3 x daily - 7 x weekly - 1-2 sets - 10 reps - 20 seconds hold Seated Lumbar Flexion Stretch - 5 x daily - 10 reps Supine Figure 4 Piriformis Stretch - 2 x daily - 7 x weekly - 1 sets - 2 reps - 15-30 hold Hooklying Transversus Abdominis Palpation - 1 x daily - 7 x weekly - 1 sets - 10 reps - 5 hold Standing March with Counter Support - 1 x daily - 7 x weekly - 3 sets - 10 reps Standing Hip Abduction with Counter Support - 1 x daily - 7 x weekly - 3 sets - 10 reps Standing Hip Extension with Counter Support - 1 x daily - 7 x weekly - 3 sets - 10 reps Standing Hip Hinge with Dowel - 1 x daily - 7 x weekly - 10 reps - 3 sets Goblet Squat with Kettlebell - 1 x daily - 7 x weekly - 3 sets - 10 reps Kettlebell Deadlift - 1 x daily - 7 x weekly - 3 sets Standing Row with Anchored Resistance - 1 x daily - 7 x weekly - 2-3 sets - 10 reps Standing Anti-Rotation Press with Anchored Resistance - 1 x daily - 7 x weekly - 2-3 sets - 10 reps Shoulder Extension with Resistance - 1 x daily - 7 x weekly - 2-3 sets - 10 reps Sit to Stand Without Arm Support - 1 x daily - 7 x weekly - 2-3 sets - 10 reps

## 2021-03-30 NOTE — Therapy (Addendum)
[place], [place], [address] Phone: [phone]   Fax:  [phone]  Physical Therapy Treatment/Discharge  Patient Details  Name: Gloria [name] MRN: [id number] Date of Birth: Jul 20, 1951 Referring Provider (PT): Billey Gosling MD   Encounter Date: 03/30/2021   PT End of Session - 03/30/21 1618     Visit Number 16    Number of Visits 17    Date for PT Re-Evaluation 04/01/21    Authorization Type [employer] MCR    Progress Note Due on Visit 20    PT Start Time 1616    PT Stop Time 1656    PT Time Calculation (min) 40 min    Activity Tolerance Patient tolerated treatment well    Behavior During Therapy [facility] for tasks assessed/performed             Past Medical History:  Diagnosis Date   Alopecia areata 10/2010 onset   Diabetes mellitus, type 2 ([place])    Dyslipidemia    Hypertension    Hypothyroid    Myasthenia gravis     Past Surgical History:  Procedure Laterality Date   ABDOMINAL HYSTERECTOMY  1990    There were no vitals filed for this visit.   Subjective Assessment - 03/30/21 1619     Subjective Did half a mile today. I felt tired after but it felt good. I'm good with this being my last appt.    Currently in Pain? Yes    Pain Score 5     Pain Location Back    Pain Orientation Lower    Pain Descriptors / Indicators Other (Comment)   stiffness   Pain Type Chronic pain    Pain Onset More than a month ago    Pain Frequency Constant                  [facility] PT Assessment - 03/30/21 0001       Strength   Right Hip Flexion 4/5    Right Hip Extension 4/5    Right Hip ABduction 4/5    Left Hip Flexion 5/5    Left Hip Extension 4/5    Left Hip ABduction 4/5    Right Knee Flexion 5/5    Right Knee Extension 5/5    Left Knee Flexion 4+/5    Left Knee Extension 4+/5                                        [facility] Adult PT Treatment/Exercise - 03/30/21 0001        Self-Care   Self-Care Other Self-Care Comments    Other Self-Care Comments  see education, d/c discussion, LTG assessment      Lumbar Exercises: Aerobic   Nustep L5 x 5 min LE      Lumbar Exercises: Standing   Other Standing Lumbar Exercises deadlifts x15 15#, deadlifts x10 15# without box, goblet squats x10 15#   cued to not round back during deadlift     Lumbar Exercises: Seated   Other Seated Lumbar Exercises on physio green ball: palloff press red 2x12, rows red 2x12, shoulder extension 2x12                          PT Education - 03/30/21 1659     Education Details finalize HEP, educated to call if status  changes, educated on how to progress self with bands, progressive overload with walking    Person(s) Educated Patient    Methods Explanation    Comprehension Verbalized understanding;Returned demonstration              PT Short Term Goals - 02/16/21 0905       PT SHORT TERM GOAL #1   Title Independent with intial HEP    Baseline able to be independent with exercises in the AM and at PM    Time 3    Period Weeks    Status Achieved    Target Date 02/02/21      PT SHORT TERM GOAL #2   Title Pt unable to sleep without pain with any movement    Baseline I am able to sleep up to sleep 5 hours now    Time 3    Period Weeks    Status Achieved      PT SHORT TERM GOAL #3   Title Pt unable to dress self without increased pain ( putting on shoes and socks)    Baseline pt able to get R ankle up on left knee. Pt able to dress with socks and shoes but still painful on R >L    Time 3    Period Weeks    Status Achieved                PT Long Term Goals - 03/30/21 1621       PT LONG TERM GOAL #1   Title Independent with advanced HEP as given including proper use of gym equimpment    Baseline given this visit    Status Achieved      PT LONG TERM GOAL #2   Title Improve FOTO intake from 28% to at least 48%    Baseline 53% functional status  (predicted 48%)    Status Achieved      PT LONG TERM GOAL #3   Title Increase trunk flexion AROM at least 20 deg to improve ability for bending for chores and activities such as donning shoes    Baseline Pt able to bend forward 70 degrees   able to touch shins also able to sit and bring ankle to knee, does complain about pain but able to do it    Status Achieved      PT LONG TERM GOAL #4   Title Pt will be able to stand and lift items overhead to top shelf in kitchen without exacerbating back pain    Baseline pt reports little pain with lifting items overhead, but has self strategies to bring the pain back down    Time --    Period --    Status Partially Met      PT LONG TERM GOAL #5   Title Pt will improve L/R hip ext and hip abd / knee flex/ext  strength to >/= 4+/5 with </= without exacerbation of back pain to promote correct use of gym equipment and promote injury prevention    Baseline strength bilaterally 4/5 or above    Time --    Period --    Status Partially Met      PT LONG TERM GOAL #6   Title Pt will be able to demo lifting groceries from floor to show proper technique and practice injury prevention with household chores    Baseline pt deadlifting 15#    Status Achieved  Plan - 03/30/21 1641     Clinical Impression Statement Pt tolerated tx well with no adverse effects. Pt agreeable to d/c this visit and states she is independent with exercises. Pt instructed to get another doctor referral if her status changes and she needs further PT. HEP finalized this session and appropriate load given. Pt instruccted on how to progress herself as appropriate. All LTG's met except 2 that were partially met due to strength and increase in back pain with some activity.    Personal Factors and Comorbidities Comorbidity 1    Comorbidities DM, HTN hyperlipidemia, Myasthenia Gravis  See med history    Examination-Activity Limitations  Bend;Carry;Dressing;Lift;Locomotion Level;Sit;Squat;Stairs;Stand;Transfers    Examination-Participation Restrictions Cleaning;Driving    Stability/Clinical Decision Making Evolving/Moderate complexity    Clinical Decision Making Moderate    Rehab Potential Good    PT Treatment/Interventions ADLs/Self Care Home Management;Aquatic Therapy;Cryotherapy;Electrical Stimulation;Moist Heat;Traction;Ultrasound;Gait training;Stair training;Functional mobility training;Therapeutic activities;Therapeutic exercise;Balance training;Neuromuscular re-education;Manual techniques;Patient/family education;Passive range of motion;Dry needling;Taping;Joint Manipulations;Spinal Manipulations    PT Next Visit Plan d/c    PT Home Exercise Plan [id number]    Consulted and Agree with Plan of Care Patient             Patient will benefit from skilled therapeutic intervention in order to improve the following deficits and impairments:  Difficulty walking,Decreased mobility,Decreased range of motion,Decreased strength,Hypomobility,Increased muscle spasms,Postural dysfunction,Improper body mechanics,Impaired flexibility,Pain  Visit Diagnosis: Radiculopathy, lumbar region  Muscle weakness (generalized)  Difficulty in walking, not elsewhere classified  Cramp and spasm      Problem List Patient Active Problem List   Diagnosis Date Noted   Bradycardia 08/20/2020   Acute cervical radiculopathy 07/09/2020   Pruritus 02/06/2020   Raynaud's phenomenon 01/06/2020   Leg cramping 07/25/2018   Left wrist tendinitis 04/30/2018   Left-sided low back pain with sciatica 04/30/2018   Hand numbness 01/24/2018   Lumbar radiculopathy 12/23/2016   Plantar fasciitis of right foot 06/22/2016   Cough 12/19/2014   Carpal tunnel syndrome of right wrist 03/07/2013   Myasthenia gravis without exacerbation ([place]) 12/31/2010   ALOPECIA 12/31/2010   Hypothyroidism 12/29/2010   Type 2 diabetes, diet controlled ([place]) 12/29/2010    Hyperlipidemia 12/29/2010   Essential hypertension 12/29/2010   Coronary atherosclerosis 12/29/2010   OSTEOARTHRITIS 12/29/2010    Caleb Popp, SPT 03/30/2021, 5:04 PM  [facility] [address] [place], [place], [address] Phone: [phone]   Fax:  [phone]  Name: Feiga Nadel MRN: [id number] Date of Birth: 1950-12-19   PHYSICAL THERAPY DISCHARGE SUMMARY  Visits from Start of Care: 16  Current functional level related to goals / functional outcomes: See above   Remaining deficits: See above   Education / Equipment: HEP Plan: Patient agrees to discharge.  Patient goals were partially met. Patient is being discharged due to being pleased with current functional status.

## 2021-03-31 ENCOUNTER — Telehealth: Payer: Self-pay | Admitting: Pharmacist

## 2021-03-31 NOTE — Progress Notes (Addendum)
  Chronic Care Management Pharmacy Assistant   Name: Gloria [name]  MRN: [id number] DOB: 04/25/1951   Reason for Encounter: Hypertension Disease State Call   Conditions to be addressed/monitored: HTN   Recent office visits:  02/23/21 Dr. Burns, no medication changes  Recent consult visits:  None ID  Hospital visits:  None in previous 6 months  Medications: Outpatient Encounter Medications as of 03/31/2021  Medication Sig   amLODipine (NORVASC) 5 MG tablet TAKE 1 TABLET(5 MG) BY MOUTH DAILY   atorvastatin (LIPITOR) 10 MG tablet TAKE 1 TABLET BY MOUTH DAILY AT 6 PM   Biotin 10 MG TABS Take by mouth.   cholecalciferol (VITAMIN D3) 25 MCG (1000 UNIT) tablet Take 1,000 Units by mouth daily.   diclofenac sodium (VOLTAREN) 1 % GEL Apply 4 g topically 4 (four) times daily.   gabapentin (NEURONTIN) 300 MG capsule TAKE 1 CAPSULE(300 MG) BY MOUTH THREE TIMES DAILY   levothyroxine (SYNTHROID) 100 MCG tablet TAKE 1 TABLET BY MOUTH DAILY   losartan (COZAAR) 100 MG tablet TAKE 1 TABLET(100 MG) BY MOUTH DAILY   meloxicam (MOBIC) 15 MG tablet TAKE 1 TABLET(15 MG) BY MOUTH DAILY (Patient taking differently: Take 15 mg by mouth daily as needed.)   Omega-3 1000 MG CAPS Take 1 capsule by mouth daily.   pyridostigmine (MESTINON) 60 MG tablet Take 60 mg by mouth 3 (three) times daily. Per Neurologist   ST JOSEPH ASPIRIN PO Take 81 mg by mouth daily.   No facility-administered encounter medications on file as of 03/31/2021.   Reviewed chart prior to disease state call. Spoke with patient regarding BP  Recent Office Vitals: BP Readings from Last 3 Encounters:  02/23/21 128/80  12/22/20 124/70  10/27/20 (!) 158/83   Pulse Readings from Last 3 Encounters:  02/23/21 62  12/22/20 (!) 50  10/27/20 (!) 52    Wt Readings from Last 3 Encounters:  02/23/21 164 lb (74.4 kg)  12/22/20 167 lb (75.8 kg)  08/20/20 170 lb 6.4 oz (77.3 kg)     Kidney Function Lab Results  Component Value  Date/Time   CREATININE 1.16 02/23/2021 03:03 PM   CREATININE 1.03 02/06/2020 11:59 AM   GFR 48.08 (L) 02/23/2021 03:03 PM   GFRNONAA >60 08/24/2010 02:38 PM   GFRAA  08/24/2010 02:38 PM    >60        The eGFR has been calculated using the MDRD equation. This calculation has not been validated in all clinical situations. eGFR's persistently <60 mL/min signify possible Chronic Kidney Disease.    BMP Latest Ref Rng & Units 02/23/2021 02/06/2020 07/25/2018  Glucose 70 - 99 mg/dL 84 108(H) 99  BUN 6 - 23 mg/dL 19 15 18  Creatinine 0.40 - 1.20 mg/dL 1.16 1.03 1.11  Sodium 135 - 145 mEq/L 139 139 139  Potassium 3.5 - 5.1 mEq/L 4.0 3.8 3.8  Chloride 96 - 112 mEq/L 103 103 104  CO2 19 - 32 mEq/L 31 30 30  Calcium 8.4 - 10.5 mg/dL 9.8 9.6 9.7    Current antihypertensive regimen: Patient states she is taking amlodipine 5 mg 1 tab daily and losartan 100 mg 1 tab daily  How often are you checking your Blood Pressure? Patient states that she has not taken blood pressure in a few days.  Current home BP readings: Patient was able to take this morning while on the phone and it was 153/86  What recent interventions/DTPs have been made by any provider to improve Blood Pressure control   since last CPP Visit: Patient to continue taking current blood pressure medications  Any recent hospitalizations or ED visits since last visit with CPP? Patient has not had any recent hospital or ED visits  What diet changes have been made to improve Blood Pressure Control?  Patient states that she tries to eat healthy every day, vegetable, fruits, oatmeal  What exercise is being done to improve your Blood Pressure Control?  Patient states that she works out 3 to 4 times a week  Adherence Review: Is the patient currently on ACE/ARB medication? Yes, Losartan Does the patient have >5 day gap between last estimated fill dates? No   Star Rating Drugs: Atorvastatin 01/22/21 90 ds Losartan 02/08/21 90 ds   [place] Pharmacist Assistant [phone]

## 2021-04-01 NOTE — Telephone Encounter (Signed)
Patient's BP was elevated while on the phone with pharmacy assistant. However BP was controlled during last office visit < 130/80.  No changes indicated.  BP Readings from Last 3 Encounters:  02/23/21 128/80  12/22/20 124/70  10/27/20 (!) 158/83

## 2021-04-08 ENCOUNTER — Encounter: Payer: Self-pay | Admitting: Physical Therapy

## 2021-04-17 ENCOUNTER — Other Ambulatory Visit: Payer: Self-pay | Admitting: Internal Medicine

## 2021-05-11 NOTE — Progress Notes (Signed)
Subjective:    Patient ID: Gloria [name], female    DOB: 1951/06/27, 70 y.o.   MRN: [id number]  HPI The patient is here for an acute visit.   She was bitten by something on her butt.  She got up yesterday and she felt a large bump on her left buttock.  It was hard and tender.  It does not itch.    She put on triamcinolone and alcohol on it.  It has decreased a lot.    She denies fever or chills.       Medications and allergies reviewed with patient and updated if appropriate.  Patient Active Problem List   Diagnosis Date Noted  . Insect bite of buttock with local reaction 05/12/2021  . Bradycardia 08/20/2020  . Acute cervical radiculopathy 07/09/2020  . Pruritus 02/06/2020  . Raynaud's phenomenon 01/06/2020  . Leg cramping 07/25/2018  . Left wrist tendinitis 04/30/2018  . Left-sided low back pain with sciatica 04/30/2018  . Hand numbness 01/24/2018  . Lumbar radiculopathy 12/23/2016  . Plantar fasciitis of right foot 06/22/2016  . Cough 12/19/2014  . Carpal tunnel syndrome of right wrist 03/07/2013  . Myasthenia gravis without exacerbation ([facility]) 12/31/2010  . ALOPECIA 12/31/2010  . Hypothyroidism 12/29/2010  . Type 2 diabetes, diet controlled ([facility]) 12/29/2010  . Hyperlipidemia 12/29/2010  . Essential hypertension 12/29/2010  . Coronary atherosclerosis 12/29/2010  . OSTEOARTHRITIS 12/29/2010    Current Outpatient Medications on File Prior to Visit  Medication Sig Dispense Refill  . amLODipine (NORVASC) 5 MG tablet TAKE 1 TABLET(5 MG) BY MOUTH DAILY 90 tablet 1  . atorvastatin (LIPITOR) 10 MG tablet TAKE 1 TABLET BY MOUTH DAILY AT 6 PM 90 tablet 0  . Biotin 10 MG TABS Take by mouth.    . cholecalciferol (VITAMIN D3) 25 MCG (1000 UNIT) tablet Take 1,000 Units by mouth daily.    . diclofenac sodium (VOLTAREN) 1 % GEL Apply 4 g topically 4 (four) times daily. 100 g 5  . gabapentin (NEURONTIN) 300 MG capsule TAKE 1 CAPSULE(300 MG) BY MOUTH THREE TIMES DAILY 90  capsule 2  . levothyroxine (SYNTHROID) 100 MCG tablet TAKE 1 TABLET BY MOUTH DAILY 90 tablet 0  . losartan (COZAAR) 100 MG tablet TAKE 1 TABLET(100 MG) BY MOUTH DAILY 90 tablet 1  . meloxicam (MOBIC) 15 MG tablet TAKE 1 TABLET(15 MG) BY MOUTH DAILY (Patient taking differently: Take 15 mg by mouth daily as needed.) 90 tablet 1  . Omega-3 1000 MG CAPS Take 1 capsule by mouth daily.    Marland Kitchen pyridostigmine (MESTINON) 60 MG tablet Take 60 mg by mouth 3 (three) times daily. Per Neurologist    . ST JOSEPH ASPIRIN PO Take 81 mg by mouth daily.     No current facility-administered medications on file prior to visit.    Past Medical History:  Diagnosis Date  . Alopecia areata 10/2010 onset  . Diabetes mellitus, type 2 ([facility])   . Dyslipidemia   . Hypertension   . Hypothyroid   . Myasthenia gravis     Past Surgical History:  Procedure Laterality Date  . ABDOMINAL HYSTERECTOMY  1990    Social History   Socioeconomic History  . Marital status: Divorced    Spouse name: Not on file  . Number of children: 3  . Years of education: Not on file  . Highest education level: Not on file  Occupational History  . Occupation: retired  Tobacco Use  . Smoking status: Never Smoker  .  Smokeless tobacco: Never Used  Vaping Use  . Vaping Use: Never used  Substance and Sexual Activity  . Alcohol use: No  . Drug use: Never  . Sexual activity: Never  Other Topics Concern  . Not on file  Social History Narrative  . Not on file   Social Determinants of Health   Financial Resource Strain: Low Risk   . Difficulty of Paying Living Expenses: Not hard at all  Food Insecurity: No Food Insecurity  . Worried About Programme researcher, broadcasting/film/video in the Last Year: Never true  . Ran Out of Food in the Last Year: Never true  Transportation Needs: No Transportation Needs  . Lack of Transportation (Medical): No  . Lack of Transportation (Non-Medical): No  Physical Activity: Inactive  . Days of Exercise per Week: 0 days   . Minutes of Exercise per Session: 0 min  Stress: No Stress Concern Present  . Feeling of Stress : Only a little  Social Connections: Not on file    Family History  Adopted: Yes  Problem Relation Age of Onset  . Breast cancer Neg Hx     Review of Systems     Objective:   Vitals:   05/12/21 1435  BP: 124/80  Pulse: 62  Temp: 98.5 F (36.9 C)  SpO2: 95%   BP Readings from Last 3 Encounters:  05/12/21 124/80  02/23/21 128/80  12/22/20 124/70   Wt Readings from Last 3 Encounters:  05/12/21 164 lb 3.2 oz (74.5 kg)  02/23/21 164 lb (74.4 kg)  12/22/20 167 lb (75.8 kg)   Body mass index is 26.5 kg/m.   Physical Exam Constitutional:      General: She is not in acute distress.    Appearance: Normal appearance. She is not ill-appearing.  HENT:     Head: Normocephalic and atraumatic.  Skin:    General: Skin is warm and dry.     Findings: No bruising or lesion.     Comments: Area of erythema left buttock approximately the size of an orange that is minimally tender, nonswollen.  Lower back area of patchy erythema, slightly raised, area mid back walnut sized area of erythema slightly raised  Neurological:     Mental Status: She is alert.            Assessment & Plan:    See Problem List for Assessment and Plan of chronic medical problems.    This visit occurred during the SARS-CoV-2 public health emergency.  Safety protocols were in place, including screening questions prior to the visit, additional usage of staff PPE, and extensive cleaning of exam room while observing appropriate contact time as indicated for disinfecting solutions.

## 2021-05-12 ENCOUNTER — Other Ambulatory Visit: Payer: Self-pay

## 2021-05-12 ENCOUNTER — Ambulatory Visit (INDEPENDENT_AMBULATORY_CARE_PROVIDER_SITE_OTHER): Payer: Medicare HMO | Admitting: Internal Medicine

## 2021-05-12 ENCOUNTER — Encounter: Payer: Self-pay | Admitting: Internal Medicine

## 2021-05-12 DIAGNOSIS — S30860A Insect bite (nonvenomous) of lower back and pelvis, initial encounter: Secondary | ICD-10-CM | POA: Insufficient documentation

## 2021-05-12 DIAGNOSIS — W57XXXA Bitten or stung by nonvenomous insect and other nonvenomous arthropods, initial encounter: Secondary | ICD-10-CM

## 2021-05-12 NOTE — Assessment & Plan Note (Addendum)
Acute Presumed insect bite-she thinks that is what happened overnight 2 areas of redness on left buttock and one in mid back Improved a lot per patient No evidence of infection Continue triamcinolone cream as needed She will call or return if her symptoms do not continue to improve/resolve

## 2021-05-12 NOTE — Patient Instructions (Signed)
   Continue the triamcinolone cream for a couple of more days.  If your symptoms do not resolve please let me know.

## 2021-05-17 ENCOUNTER — Telehealth: Payer: Self-pay | Admitting: Internal Medicine

## 2021-05-17 MED ORDER — TRIAMCINOLONE ACETONIDE 0.1 % EX CREA: 1.0000 "application " | TOPICAL_CREAM | Freq: Two times a day (BID) | CUTANEOUS | 0 refills | Status: DC

## 2021-05-17 NOTE — Telephone Encounter (Signed)
1.Medication Requested: triamcinolone cream (KENALOG) 0.1 % - patient said that she is requesting the medicine that is in the red and white box    2. Pharmacy (Name, Street, [place]): [facility] [phone] - , [place] - [address] AT [facility] [address]  3. On Med List: no   4. Last Visit with PCP: 05-12-21  5. Next visit date with PCP: 08-24-21   Agent: Please be advised that RX refills may take up to 3 business days. We ask that you follow-up with your pharmacy.

## 2021-05-24 ENCOUNTER — Telehealth: Payer: Medicare HMO

## 2021-05-24 ENCOUNTER — Telehealth: Payer: Self-pay | Admitting: Pharmacist

## 2021-05-24 NOTE — Telephone Encounter (Signed)
  Chronic Care Management   Outreach Note  05/24/2021 Name: Gloria [name] MRN: [id number] DOB: [birth year]  Referred by: Pincus Sanes, MD  Patient had a phone appointment scheduled with clinical pharmacist today.  An unsuccessful telephone outreach was attempted today. The patient was referred to the pharmacist for assistance with medications, care management and care coordination.   Patient will NOT be penalized in any way for missing a CCM appointment. The no-show fee does not apply.  If possible, a message was left to return call to: [phone] or to [facility]: [phone]   Al Corpus, PharmD, P[place]y [place], CPP Clinical Pharmacist [facility] [phone]

## 2021-05-24 NOTE — Progress Notes (Deleted)
Chronic Care Management Pharmacy Note  05/24/2021 Name:  Gloria [name] MRN:  [id number] DOB:  11/02/1951  Subjective: Bevely Hackbart [name] is an 70 y.o. year old female who is a primary patient of Burns, Claudina Lick, MD.  The CCM team was consulted for assistance with disease management and care coordination needs.    Engaged with patient by telephone for follow up visit in response to provider referral for pharmacy case management and/or care coordination services.   Consent to Services:  The patient was given information about Chronic Care Management services, agreed to services, and gave verbal consent prior to initiation of services.  Please see initial visit note for detailed documentation.   Patient Care Team: Binnie Rail, MD as PCP - General (Internal Medicine) Caress, Jeneen Rinks, MD (Neurology) Gardiner Barefoot, DPM (Podiatry) Lavonna Monarch, MD (Dermatology) Clarene Essex, MD (Gastroenterology) Warden Fillers, MD as Consulting Physician (Ophthalmology) Charlton Haws, [facility] as Pharmacist (Pharmacist)  Recent office visits: 05/12/21 Dr Quay Burow OV: acute visit for insect bite. Continue triamcinolone cream.  02/23/21 Dr. Quay Burow, no medication changes  12/22/20 Dr Quay Burow OV: acute visit for hip/leg pain x 1 week. Given steroid injection, referred to PT.  Recent consult visits: 02/2021 PT for back pain  01/21/21 Dr Vallarie Mare [facility] neurology): pt stopped Mestinon and slightly worse symptoms, MMT stable however. Advised to take Mestinon as needed.  10/27/20 Dr Ernestina Patches (ortho): given steroid injection for back pain  Hospital visits: None in previous 6 months  Objective:  Lab Results  Component Value Date   CREATININE 1.16 02/23/2021   BUN 19 02/23/2021   GFR 48.08 (L) 02/23/2021   GFRNONAA >60 08/24/2010   GFRAA  08/24/2010    >60        The eGFR has been calculated using the MDRD equation. This calculation has not been validated in all clinical situations. eGFR's  persistently <60 mL/min signify possible Chronic Kidney Disease.   NA 139 02/23/2021   K 4.0 02/23/2021   CALCIUM 9.8 02/23/2021   CO2 31 02/23/2021    Lab Results  Component Value Date/Time   HGBA1C 5.8 02/23/2021 03:03 PM   HGBA1C 5.6 02/06/2020 11:59 AM   GFR 48.08 (L) 02/23/2021 03:03 PM   GFR 64.38 02/06/2020 11:59 AM   MICROALBUR 2.0 (H) 12/23/2016 11:50 AM   MICROALBUR 4.8 (H) 06/16/2015 11:11 AM    Last diabetic Eye exam:  Lab Results  Component Value Date/Time   HMDIABEYEEXA No Retinopathy 06/29/2017 12:00 AM    Last diabetic Foot exam: No results found for: HMDIABFOOTEX   Lab Results  Component Value Date   CHOL 148 02/23/2021   HDL 56.60 02/23/2021   LDLCALC 76 02/23/2021   TRIG 76.0 02/23/2021   CHOLHDL 3 02/23/2021    Hepatic Function Latest Ref Rng & Units 02/23/2021 02/06/2020 07/25/2018  Total Protein 6.0 - 8.3 g/dL 7.4 7.6 7.5  Albumin 3.5 - 5.2 g/dL 4.0 4.0 4.1  AST 0 - 37 U/L '24 19 22  ' ALT 0 - 35 U/L '16 11 15  ' Alk Phosphatase 39 - 117 U/L 59 73 60  Total Bilirubin 0.2 - 1.2 mg/dL 0.5 0.5 0.6  Bilirubin, Direct 0.0 - 0.3 mg/dL - - -    Lab Results  Component Value Date/Time   TSH 0.41 02/23/2021 03:03 PM   TSH 0.36 02/06/2020 11:59 AM   FREET4 0.90 02/11/2011 11:01 AM    CBC Latest Ref Rng & Units 02/23/2021 02/06/2020 07/25/2018  WBC 4.0 - 10.5 K/uL 5.1  4.4 5.5  Hemoglobin 12.0 - 15.0 g/dL 12.9 12.3 12.8  Hematocrit 36.0 - 46.0 % 38.7 37.3 38.1  Platelets 150.0 - 400.0 K/uL 174.0 178.0 202.0    No results found for: VD25OH  Clinical ASCVD: No  The 10-year ASCVD risk score Mikey Bussing DC Jr., et al., 2013) is: 18.5%   Values used to calculate the score:     Age: 40 years     Sex: Female     Is Non-Hispanic African American: Yes     Diabetic: Yes     Tobacco smoker: No     Systolic Blood Pressure: 175 mmHg     Is BP treated: Yes     HDL Cholesterol: 56.6 mg/dL     Total Cholesterol: 148 mg/dL    Depression screen [facility] 2/9 08/19/2020 01/06/2020  08/12/2019  Decreased Interest 0 0 0  Down, Depressed, Hopeless 1 0 0  PHQ - 2 Score 1 0 0     Social History   Tobacco Use  Smoking Status Never  Smokeless Tobacco Never   BP Readings from Last 3 Encounters:  05/12/21 124/80  02/23/21 128/80  12/22/20 124/70   Pulse Readings from Last 3 Encounters:  05/12/21 62  02/23/21 62  12/22/20 (!) 50   Wt Readings from Last 3 Encounters:  05/12/21 164 lb 3.2 oz (74.5 kg)  02/23/21 164 lb (74.4 kg)  12/22/20 167 lb (75.8 kg)   BMI Readings from Last 3 Encounters:  05/12/21 26.50 kg/m  02/23/21 26.47 kg/m  12/22/20 26.95 kg/m    Assessment/Interventions: Review of patient past medical history, allergies, medications, health status, including review of consultants reports, laboratory and other test data, was performed as part of comprehensive evaluation and provision of chronic care management services.   SDOH:  (Social Determinants of Health) assessments and interventions performed: Yes   SDOH Screenings   Alcohol Screen: Low Risk    Last Alcohol Screening Score (AUDIT): 0  Depression (PHQ2-9): Low Risk    PHQ-2 Score: 1  Financial Resource Strain: Low Risk    Difficulty of Paying Living Expenses: Not hard at all  Food Insecurity: No Food Insecurity   Worried About Charity fundraiser in the Last Year: Never true   Ran Out of Food in the Last Year: Never true  Housing: Low Risk    Last Housing Risk Score: 0  Physical Activity: Inactive   Days of Exercise per Week: 0 days   Minutes of Exercise per Session: 0 min  Social Connections: Not on file  Stress: No Stress Concern Present   Feeling of Stress : Only a little  Tobacco Use: Low Risk    Smoking Tobacco Use: Never   Smokeless Tobacco Use: Never  Transportation Needs: No Transportation Needs   Lack of Transportation (Medical): No   Lack of Transportation (Non-Medical): No    CCM Care Plan  Allergies  Allergen Reactions   Vicodin [Hydrocodone-Acetaminophen]      Facial swelling    Medications Reviewed Today     Reviewed by Binnie Rail, MD (Physician) on 05/12/21 at 1440  Med List Status: <None>   Medication Order Taking? Sig Documenting Provider Last Dose Status Informant  amLODipine (NORVASC) 5 MG tablet [id number] Yes TAKE 1 TABLET(5 MG) BY MOUTH DAILY Burns, Claudina Lick, MD Taking Active   atorvastatin (LIPITOR) 10 MG tablet [id number] Yes TAKE 1 TABLET BY MOUTH DAILY AT 6 PM Binnie Rail, MD Taking Active   Biotin 10 MG TABS [id number]  Yes Take by mouth. [provider] Taking Active   cholecalciferol (VITAMIN D3) 25 MCG (1000 UNIT) tablet [id number] Yes Take 1,000 Units by mouth daily. [provider] Taking Active   diclofenac sodium (VOLTAREN) 1 % GEL [id number] Yes Apply 4 g topically 4 (four) times daily. Binnie Rail, MD Taking Active   gabapentin (NEURONTIN) 300 MG capsule [id number] Yes TAKE 1 CAPSULE(300 MG) BY MOUTH THREE TIMES DAILY Burns, Claudina Lick, MD Taking Active   levothyroxine (SYNTHROID) 100 MCG tablet [id number] Yes TAKE 1 TABLET BY MOUTH DAILY Burns, Claudina Lick, MD Taking Active   losartan (COZAAR) 100 MG tablet [id number] Yes TAKE 1 TABLET(100 MG) BY MOUTH DAILY Burns, Claudina Lick, MD Taking Active   meloxicam (MOBIC) 15 MG tablet [id number] Yes TAKE 1 TABLET(15 MG) BY MOUTH DAILY  Patient taking differently: Take 15 mg by mouth daily as needed.   Binnie Rail, MD Taking Active   Omega-3 1000 MG CAPS [id number] Yes Take 1 capsule by mouth daily. [provider] Taking Active   pyridostigmine (MESTINON) 60 MG tablet [id number] Yes Take 60 mg by mouth 3 (three) times daily. Per Neurologist [provider] Taking Active   ST JOSEPH ASPIRIN PO [id number] Yes Take 81 mg by mouth daily. [provider] Taking Active             Patient Active Problem List   Diagnosis Date Noted   Insect bite of buttock with local reaction 05/12/2021   Bradycardia 08/20/2020   Acute cervical radiculopathy  07/09/2020   Pruritus 02/06/2020   Raynaud's phenomenon 01/06/2020   Leg cramping 07/25/2018   Left wrist tendinitis 04/30/2018   Left-sided low back pain with sciatica 04/30/2018   Hand numbness 01/24/2018   Lumbar radiculopathy 12/23/2016   Plantar fasciitis of right foot 06/22/2016   Cough 12/19/2014   Carpal tunnel syndrome of right wrist 03/07/2013   Myasthenia gravis without exacerbation ([place]) 12/31/2010   ALOPECIA 12/31/2010   Hypothyroidism 12/29/2010   Type 2 diabetes, diet controlled ([place]) 12/29/2010   Hyperlipidemia 12/29/2010   Essential hypertension 12/29/2010   Coronary atherosclerosis 12/29/2010   OSTEOARTHRITIS 12/29/2010    Immunization History  Administered Date(s) Administered   Influenza Split 09/05/2012   Influenza,inj,Quad PF,6+ Mos 11/28/2013, 10/17/2014   PFIZER(Purple Top)SARS-COV-2 Vaccination 01/23/2020, 02/17/2020   Pneumococcal Conjugate-13 01/06/2020   Pneumococcal Polysaccharide-23 09/05/2012   Td 12/12/2009    Conditions to be addressed/monitored:  Hypertension, Hyperlipidemia, Diabetes, Hypothyroidism, Osteoarthritis and Myasthenia gravis  Patient Care Plan: CCM Pharmacy Care Plan     Problem Identified: Hypertension, Hyperlipidemia, Diabetes, Hypothyroidism, Osteoarthritis and Myasthenia gravis   Priority: High     Long-Range Goal: Disease management   Start Date: 02/22/2021  Expected End Date: 08/25/2021  This Visit's Progress: On track  Priority: High  Note:   Current Barriers:  Unable to independently monitor therapeutic efficacy Unable to maintain control of HTN  Pharmacist Clinical Goal(s):  Over the next 90 days, patient will achieve adherence to monitoring guidelines and medication adherence to achieve therapeutic efficacy maintain control of HTN as evidenced by home BP readings  through collaboration with PharmD and provider.   Interventions: 1:1 collaboration with Binnie Rail, MD regarding development and update of  comprehensive plan of care as evidenced by provider attestation and co-signature Inter-disciplinary care team collaboration (see longitudinal plan of care) Comprehensive medication review performed; medication list updated in electronic medical record  Hypertension (BP goal <130/80) -Not ideally controlled - per pt home BP has been  elevated lately -Current treatment: Amlodipine 5 mg daily lunch Losartan 100 mg daily lunch -Current home readings: 140-150/70 (AM) -Denies hypotensive/hypertensive symptoms -Educated on BP goals and benefits of medications for prevention of heart attack, stroke and kidney damage; Daily salt intake goal < 2300 mg; Exercise goal of 150 minutes per week; Importance of home blood pressure monitoring; -Counseled to monitor BP at home daily (varying times), document, and provide log at future appointments -Recommended to move BP medications to bedtime  Hyperlipidemia: (LDL goal < 100) -hx coronary atherosclerosis -Controlled - LDL 1 year ago was at goal -Current treatment: Atorvastatin 10 mg daily Aspirin 81 daily  OTC Omega-3 Fish Oil -Current dietary patterns: tries to limit meat, fried foods; eats salads, shrimp, pasta -Current exercise habits: workout 3 days/week (walking, stretching) -Educated on Cholesterol goals;  Benefits of statin for ASCVD risk reduction; Exercise goal of 150 minutes per week; -Recommended to continue current medication  Diabetes (A1c goal <7%) -Diet-controlled -Current home glucose readings: not checking -Denies hypoglycemic/hyperglycemic symptoms -Educated on Exercise goal of 150 minutes per week; -Counseled on diet and exercise extensively  Myesthenia gravis (Goal: manage symptoms) -Controlled - per patient and neurology notes. She reports gabapentin is not very helpful for nerve pain -Current treatment: Gabapentin 300 mg TID prn - not taking Pyridostigmine 60 mg TID prn - not taking -Counseled on range of gabapentin  doses 100 - 3600 mg per day; pt is currently satisfied with symptom management without medication -Recommended to continue current medication  Hypothyroidism (Goal: TSH in target range) -Controlled - TSH has been low end of normal range past 3 years; it is possible TSH is too low and contributing to elevated BP -Current treatment  Levothyroxine 100 mcg daily - pt has been taking with breakfast -Recommended to continue current medication - pt has PCP visit tomorrow and TSH will be checked again, will defer management to PCP  Osteoarthritis (Goal: manage pain) -Controlled - pt reports using meloxicam less than daily -Current treatment  Diclofenac 1% gel PRN Meloxicam 15 mg daily PRN (back pain, joint pain) Aleve PRN -Counseled on interaction between Aleve and meloxicam; advised to use one or the other -Recommended to continue current medication  Health Maintenance -Vaccine gaps: TDAP, flu, covid booster -Current therapy:  Vitamin D3  Multivitamin  Vitamin E  Biotin  -Patient is satisfied with current therapy and denies issues  -Discussed likelihood of Vitamin E deficiency - pt is looking to cut down on pills, she may stop taking Vitamin E -Recommended to continue current medication  Patient Goals/Self-Care Activities Over the next 90 days, patient will:  - take medications as prescribed focus on medication adherence by routine check blood pressure daily (varying times), document, and provide at future appointments target a minimum of 150 minutes of moderate intensity exercise weekly  -take all medications at bedtime except levothyroxine (take in AM on empty stomach)  Follow Up Plan: Telephone follow up appointment with care management team member scheduled for: 3 months      Medication Assistance: None required.  Patient affirms current coverage meets needs.  Compliance/Adherence/Medication fill history: Care Gaps: Shingrix TDAP (due 12/13/19) Covid Booster (due  03/16/20) PPSV 23 (after-65 dose - due 01/05/21) Eye exam Foot exam  Star-Rating Drugs: Atorvastatin - LF 01/22/21 x 90 ds Losartan - LF 02/08/21 x 90 ds  Patient's preferred pharmacy is:  [facility] [place] [place]  [place] [address] Phone: [phone] Fax: [phone]  [place] [place] [address] Phone: [phone] Fax: [phone]  Uses pill box? No - prefers bottles Pt endorses 100% compliance  We discussed: Current pharmacy is preferred with insurance plan and patient is satisfied with pharmacy services Patient decided to: Continue current medication management strategy  Care Plan and Follow Up Patient Decision:  Patient agrees to Care Plan and Follow-up.  Plan: Telephone follow up appointment with care management team member scheduled for:  3 months  Charlene Brooke, PharmD, [place], CPP Clinical Pharmacist [facility] [phone]

## 2021-06-17 ENCOUNTER — Other Ambulatory Visit: Payer: Self-pay | Admitting: Internal Medicine

## 2021-06-24 ENCOUNTER — Telehealth: Payer: Self-pay | Admitting: Pharmacist

## 2021-06-25 NOTE — Progress Notes (Signed)
  Chronic Care Management Pharmacy Assistant   Name: Gloria [name]  MRN: [id number] DOB: 06/23/1951   Reason for Encounter: Disease State   Conditions to be addressed/monitored: HTN   Recent office visits:  05/12/21 Dr. Burns Internal Medicine  Recent consult visits:  None ID  Hospital visits:  None in previous 6 months  Medications: Outpatient Encounter Medications as of 06/24/2021  Medication Sig   amLODipine (NORVASC) 5 MG tablet TAKE 1 TABLET(5 MG) BY MOUTH DAILY   atorvastatin (LIPITOR) 10 MG tablet TAKE 1 TABLET BY MOUTH DAILY AT 6 PM   Biotin 10 MG TABS Take by mouth.   cholecalciferol (VITAMIN D3) 25 MCG (1000 UNIT) tablet Take 1,000 Units by mouth daily.   diclofenac sodium (VOLTAREN) 1 % GEL Apply 4 g topically 4 (four) times daily.   gabapentin (NEURONTIN) 300 MG capsule TAKE 1 CAPSULE(300 MG) BY MOUTH THREE TIMES DAILY   levothyroxine (SYNTHROID) 100 MCG tablet TAKE 1 TABLET BY MOUTH DAILY   losartan (COZAAR) 100 MG tablet TAKE 1 TABLET(100 MG) BY MOUTH DAILY   Omega-3 1000 MG CAPS Take 1 capsule by mouth daily.   pyridostigmine (MESTINON) 60 MG tablet Take 60 mg by mouth 3 (three) times daily. Per Neurologist   ST JOSEPH ASPIRIN PO Take 81 mg by mouth daily.   triamcinolone cream (KENALOG) 0.1 % APPLY TOPICALLY TO THE AFFECTED AREA TWICE DAILY   No facility-administered encounter medications on file as of 06/24/2021.    Reviewed chart prior to disease state call. Spoke with patient regarding BP  Recent Office Vitals: BP Readings from Last 3 Encounters:  05/12/21 124/80  02/23/21 128/80  12/22/20 124/70   Pulse Readings from Last 3 Encounters:  05/12/21 62  02/23/21 62  12/22/20 (!) 50    Wt Readings from Last 3 Encounters:  05/12/21 164 lb 3.2 oz (74.5 kg)  02/23/21 164 lb (74.4 kg)  12/22/20 167 lb (75.8 kg)     Kidney Function Lab Results  Component Value Date/Time   CREATININE 1.16 02/23/2021 03:03 PM   CREATININE 1.03 02/06/2020  11:59 AM   GFR 48.08 (L) 02/23/2021 03:03 PM   GFRNONAA >60 08/24/2010 02:38 PM   GFRAA  08/24/2010 02:38 PM    >60        The eGFR has been calculated using the MDRD equation. This calculation has not been validated in all clinical situations. eGFR's persistently <60 mL/min signify possible Chronic Kidney Disease.    BMP Latest Ref Rng & Units 02/23/2021 02/06/2020 07/25/2018  Glucose 70 - 99 mg/dL 84 108(H) 99  BUN 6 - 23 mg/dL 19 15 18  Creatinine 0.40 - 1.20 mg/dL 1.16 1.03 1.11  Sodium 135 - 145 mEq/L 139 139 139  Potassium 3.5 - 5.1 mEq/L 4.0 3.8 3.8  Chloride 96 - 112 mEq/L 103 103 104  CO2 19 - 32 mEq/L 31 30 30  Calcium 8.4 - 10.5 mg/dL 9.8 9.6 9.7    Current antihypertensive regimen:  Losartan  mg daily Amlodipine 5 mg daily  How often are you checking your Blood Pressure? 1-2x per week  Current home BP readings: Patient states that her blood pressure ranges between 120/80-150/70  What recent interventions//DTPs have been made by any provider to improve Blood Pressure control since last CPP Visit: None ID  Any recent hospitalizations or ED visits since last visit with CPP? No  What diet changes have been made to improve Blood Pressure Control?  Patient states that she has not made   any changes to diet, still trying to eat healthy  What exercise is being done to improve your Blood Pressure Control?  Patient states that she exercises when she can  Adherence Review: Is the patient currently on ACE/ARB medication? Yes Does the patient have >5 day gap between last estimated fill dates? No   Star Rating Drugs: Losartan 04/18/21 90 ds Atorvastatin 04/18/21 90 ds  [place] Pharmacist Assistant [phone]   Time spent:30

## 2021-07-08 DIAGNOSIS — H2513 Age-related nuclear cataract, bilateral: Secondary | ICD-10-CM | POA: Diagnosis not present

## 2021-07-08 DIAGNOSIS — H43811 Vitreous degeneration, right eye: Secondary | ICD-10-CM | POA: Diagnosis not present

## 2021-07-08 DIAGNOSIS — H04123 Dry eye syndrome of bilateral lacrimal glands: Secondary | ICD-10-CM | POA: Diagnosis not present

## 2021-07-11 ENCOUNTER — Other Ambulatory Visit: Payer: Self-pay | Admitting: Internal Medicine

## 2021-08-09 LAB — FECAL OCCULT BLOOD, GUAIAC: Fecal Occult Blood: NEGATIVE

## 2021-08-10 NOTE — Progress Notes (Deleted)
Subjective:    Patient ID: Gloria [name], female    DOB: 1951/06/09, 70 y.o.   MRN: [id number]  HPI The patient is here for an acute visit.   Swelling on top of both feet -    Medications and allergies reviewed with patient and updated if appropriate.  Patient Active Problem List   Diagnosis Date Noted   Insect bite of buttock with local reaction 05/12/2021   Bradycardia 08/20/2020   Acute cervical radiculopathy 07/09/2020   Pruritus 02/06/2020   Raynaud's phenomenon 01/06/2020   Leg cramping 07/25/2018   Left wrist tendinitis 04/30/2018   Left-sided low back pain with sciatica 04/30/2018   Hand numbness 01/24/2018   Lumbar radiculopathy 12/23/2016   Plantar fasciitis of right foot 06/22/2016   Cough 12/19/2014   Carpal tunnel syndrome of right wrist 03/07/2013   Myasthenia gravis without exacerbation (HCC) 12/31/2010   ALOPECIA 12/31/2010   Hypothyroidism 12/29/2010   Type 2 diabetes, diet controlled (HCC) 12/29/2010   Hyperlipidemia 12/29/2010   Essential hypertension 12/29/2010   Coronary atherosclerosis 12/29/2010   OSTEOARTHRITIS 12/29/2010    Current Outpatient Medications on File Prior to Visit  Medication Sig Dispense Refill   amLODipine (NORVASC) 5 MG tablet TAKE 1 TABLET(5 MG) BY MOUTH DAILY 90 tablet 1   atorvastatin (LIPITOR) 10 MG tablet TAKE 1 TABLET BY MOUTH DAILY AT 6 PM 90 tablet 0   Biotin 10 MG TABS Take by mouth.     cholecalciferol (VITAMIN D3) 25 MCG (1000 UNIT) tablet Take 1,000 Units by mouth daily.     diclofenac sodium (VOLTAREN) 1 % GEL Apply 4 g topically 4 (four) times daily. 100 g 5   gabapentin (NEURONTIN) 300 MG capsule TAKE 1 CAPSULE(300 MG) BY MOUTH THREE TIMES DAILY 90 capsule 2   levothyroxine (SYNTHROID) 100 MCG tablet TAKE 1 TABLET BY MOUTH DAILY 90 tablet 0   losartan (COZAAR) 100 MG tablet TAKE 1 TABLET(100 MG) BY MOUTH DAILY 90 tablet 1   Omega-3 1000 MG CAPS Take 1 capsule by mouth daily.     pyridostigmine  (MESTINON) 60 MG tablet Take 60 mg by mouth 3 (three) times daily. Per Neurologist     ST JOSEPH ASPIRIN PO Take 81 mg by mouth daily.     triamcinolone cream (KENALOG) 0.1 % APPLY TOPICALLY TO THE AFFECTED AREA TWICE DAILY 30 g 0   No current facility-administered medications on file prior to visit.    Past Medical History:  Diagnosis Date   Alopecia areata 10/2010 onset   Diabetes mellitus, type 2 (HCC)    Dyslipidemia    Hypertension    Hypothyroid    Myasthenia gravis     Past Surgical History:  Procedure Laterality Date   ABDOMINAL HYSTERECTOMY  1990    Social History   Socioeconomic History   Marital status: Divorced    Spouse name: Not on file   Number of children: 3   Years of education: Not on file   Highest education level: Not on file  Occupational History   Occupation: retired  Tobacco Use   Smoking status: Never   Smokeless tobacco: Never  Vaping Use   Vaping Use: Never used  Substance and Sexual Activity   Alcohol use: No   Drug use: Never   Sexual activity: Never  Other Topics Concern   Not on file  Social History Narrative   Not on file   Social Determinants of Health   Financial Resource Strain: Low Risk  Difficulty of Paying Living Expenses: Not hard at all  Food Insecurity: No Food Insecurity   Worried About Running Out of Food in the Last Year: Never true   Ran Out of Food in the Last Year: Never true  Transportation Needs: No Transportation Needs   Lack of Transportation (Medical): No   Lack of Transportation (Non-Medical): No  Physical Activity: Inactive   Days of Exercise per Week: 0 days   Minutes of Exercise per Session: 0 min  Stress: No Stress Concern Present   Feeling of Stress : Only a little  Social Connections: Not on file    Family History  Adopted: Yes  Problem Relation Age of Onset   Breast cancer Neg Hx     Review of Systems     Objective:  There were no vitals filed for this visit. BP Readings from Last 3  Encounters:  05/12/21 124/80  02/23/21 128/80  12/22/20 124/70   Wt Readings from Last 3 Encounters:  05/12/21 164 lb 3.2 oz (74.5 kg)  02/23/21 164 lb (74.4 kg)  12/22/20 167 lb (75.8 kg)   There is no height or weight on file to calculate BMI.   Physical Exam         Assessment & Plan:    See Problem List for Assessment and Plan of chronic medical problems.    This visit occurred during the SARS-CoV-2 public health emergency.  Safety protocols were in place, including screening questions prior to the visit, additional usage of staff PPE, and extensive cleaning of exam room while observing appropriate contact time as indicated for disinfecting solutions.

## 2021-08-11 ENCOUNTER — Emergency Department (HOSPITAL_COMMUNITY)
Admission: EM | Admit: 2021-08-11 | Discharge: 2021-08-11 | Disposition: A | Discharge status: Home / Self Care | Payer: Medicare HMO | Attending: Emergency Medicine | Admitting: Emergency Medicine

## 2021-08-11 ENCOUNTER — Emergency Department (HOSPITAL_COMMUNITY): Payer: Medicare HMO

## 2021-08-11 ENCOUNTER — Ambulatory Visit: Payer: Medicare HMO | Admitting: Internal Medicine

## 2021-08-11 ENCOUNTER — Other Ambulatory Visit: Payer: Self-pay

## 2021-08-11 ENCOUNTER — Encounter (HOSPITAL_COMMUNITY): Payer: Self-pay

## 2021-08-11 DIAGNOSIS — R001 Bradycardia, unspecified: Secondary | ICD-10-CM | POA: Diagnosis not present

## 2021-08-11 DIAGNOSIS — M5020 Other cervical disc displacement, unspecified cervical region: Secondary | ICD-10-CM | POA: Insufficient documentation

## 2021-08-11 DIAGNOSIS — E785 Hyperlipidemia, unspecified: Secondary | ICD-10-CM | POA: Diagnosis not present

## 2021-08-11 DIAGNOSIS — R102 Pelvic and perineal pain: Secondary | ICD-10-CM | POA: Diagnosis not present

## 2021-08-11 DIAGNOSIS — S3991XA Unspecified injury of abdomen, initial encounter: Secondary | ICD-10-CM | POA: Diagnosis not present

## 2021-08-11 DIAGNOSIS — E1169 Type 2 diabetes mellitus with other specified complication: Secondary | ICD-10-CM | POA: Diagnosis not present

## 2021-08-11 DIAGNOSIS — G4489 Other headache syndrome: Secondary | ICD-10-CM | POA: Diagnosis not present

## 2021-08-11 DIAGNOSIS — M545 Low back pain, unspecified: Secondary | ICD-10-CM | POA: Diagnosis not present

## 2021-08-11 DIAGNOSIS — Y92410 Unspecified street and highway as the place of occurrence of the external cause: Secondary | ICD-10-CM | POA: Diagnosis not present

## 2021-08-11 DIAGNOSIS — E039 Hypothyroidism, unspecified: Secondary | ICD-10-CM | POA: Diagnosis not present

## 2021-08-11 DIAGNOSIS — Z041 Encounter for examination and observation following transport accident: Secondary | ICD-10-CM | POA: Diagnosis not present

## 2021-08-11 DIAGNOSIS — I10 Essential (primary) hypertension: Secondary | ICD-10-CM | POA: Insufficient documentation

## 2021-08-11 DIAGNOSIS — I2510 Atherosclerotic heart disease of native coronary artery without angina pectoris: Secondary | ICD-10-CM | POA: Insufficient documentation

## 2021-08-11 DIAGNOSIS — M542 Cervicalgia: Secondary | ICD-10-CM | POA: Diagnosis not present

## 2021-08-11 DIAGNOSIS — R52 Pain, unspecified: Secondary | ICD-10-CM

## 2021-08-11 DIAGNOSIS — S299XXA Unspecified injury of thorax, initial encounter: Secondary | ICD-10-CM | POA: Insufficient documentation

## 2021-08-11 DIAGNOSIS — R079 Chest pain, unspecified: Secondary | ICD-10-CM | POA: Diagnosis not present

## 2021-08-11 DIAGNOSIS — K5730 Diverticulosis of large intestine without perforation or abscess without bleeding: Secondary | ICD-10-CM | POA: Diagnosis not present

## 2021-08-11 DIAGNOSIS — R519 Headache, unspecified: Secondary | ICD-10-CM | POA: Insufficient documentation

## 2021-08-11 LAB — CBC
HCT: 36.5 % (ref 36.0–46.0)
Hemoglobin: 12.6 g/dL (ref 12.0–15.0)
MCH: 32 pg (ref 26.0–34.0)
MCHC: 34.5 g/dL (ref 30.0–36.0)
MCV: 92.6 fL (ref 80.0–100.0)
Platelets: 172 10*3/uL (ref 150–400)
RBC: 3.94 MIL/uL (ref 3.87–5.11)
RDW: 12.6 % (ref 11.5–15.5)
WBC: 3.8 10*3/uL — ABNORMAL LOW (ref 4.0–10.5)
nRBC: 0 % (ref 0.0–0.2)

## 2021-08-11 LAB — I-STAT CHEM 8, ED
BUN: 14 mg/dL (ref 8–23)
Calcium, Ion: 1.18 mmol/L (ref 1.15–1.40)
Chloride: 102 mmol/L (ref 98–111)
Creatinine, Ser: 1.1 mg/dL — ABNORMAL HIGH (ref 0.44–1.00)
Glucose, Bld: 118 mg/dL — ABNORMAL HIGH (ref 70–99)
HCT: 39 % (ref 36.0–46.0)
Hemoglobin: 13.3 g/dL (ref 12.0–15.0)
Potassium: 4 mmol/L (ref 3.5–5.1)
Sodium: 141 mmol/L (ref 135–145)
TCO2: 30 mmol/L (ref 22–32)

## 2021-08-11 LAB — COMPREHENSIVE METABOLIC PANEL
ALT: 18 U/L (ref 0–44)
AST: 28 U/L (ref 15–41)
Albumin: 3.7 g/dL (ref 3.5–5.0)
Alkaline Phosphatase: 54 U/L (ref 38–126)
Anion gap: 6 (ref 5–15)
BUN: 13 mg/dL (ref 8–23)
CO2: 30 mmol/L (ref 22–32)
Calcium: 9.3 mg/dL (ref 8.9–10.3)
Chloride: 103 mmol/L (ref 98–111)
Creatinine, Ser: 1.18 mg/dL — ABNORMAL HIGH (ref 0.44–1.00)
GFR, Estimated: 50 mL/min — ABNORMAL LOW (ref >60)
Glucose, Bld: 124 mg/dL — ABNORMAL HIGH (ref 70–99)
Potassium: 3.9 mmol/L (ref 3.5–5.1)
Sodium: 139 mmol/L (ref 135–145)
Total Bilirubin: 0.7 mg/dL (ref 0.3–1.2)
Total Protein: 7 g/dL (ref 6.5–8.1)

## 2021-08-11 MED ORDER — IOHEXOL 350 MG/ML SOLN
80.0000 mL | Freq: Once | INTRAVENOUS | Status: AC | PRN
  Administered 2021-08-11: 80 mL via INTRAVENOUS

## 2021-08-11 MED ORDER — CYCLOBENZAPRINE HCL 10 MG PO TABS: 10.0000 mg | ORAL_TABLET | Freq: Two times a day (BID) | ORAL | 0 refills | Status: DC | PRN

## 2021-08-11 MED ORDER — METHYLPREDNISOLONE 4 MG PO TBPK: ORAL_TABLET | ORAL | 0 refills | Status: DC

## 2021-08-11 MED ORDER — DIAZEPAM 5 MG/ML IJ SOLN
2.5000 mg | Freq: Once | INTRAMUSCULAR | Status: AC
  Administered 2021-08-11: 2.5 mg via INTRAVENOUS
  Filled 2021-08-11: qty 2
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
## 2021-08-11 NOTE — Discharge Instructions (Addendum)
Follow-up with neurosurgery, Dr. Maisie Fus.  Wear cervical collar for comfort.  May remove for shower and other light activities.  Take Flexeril and Medrol Dosepak as prescribed.

## 2021-08-11 NOTE — ED Notes (Signed)
Pt transported to CT via stretcher at this time.  

## 2021-08-11 NOTE — ED Notes (Signed)
Pt transported to MRI via stretcher at this time.  °

## 2021-08-11 NOTE — Progress Notes (Signed)
Orthopedic Tech Progress Note Patient Details:  Gloria [name] Gloria [name] [birth year] [id number]  Ortho Devices Type of Ortho Device: Soft collar Ortho Device/Splint Location: NECK Ortho Device/Splint Interventions: Ordered, Application, Adjustment   Post Interventions Patient Tolerated: Well Instructions Provided: Care of device  [name] 08/11/2021, 5:16 PM

## 2021-08-12 ENCOUNTER — Ambulatory Visit: Payer: Medicare Other | Admitting: Podiatry

## 2021-08-20 DIAGNOSIS — M4316 Spondylolisthesis, lumbar region: Secondary | ICD-10-CM | POA: Diagnosis not present

## 2021-08-20 DIAGNOSIS — R030 Elevated blood-pressure reading, without diagnosis of hypertension: Secondary | ICD-10-CM | POA: Diagnosis not present

## 2021-08-20 DIAGNOSIS — M4802 Spinal stenosis, cervical region: Secondary | ICD-10-CM | POA: Diagnosis not present

## 2021-08-23 ENCOUNTER — Encounter: Payer: Self-pay | Admitting: Internal Medicine

## 2021-08-23 NOTE — Patient Instructions (Addendum)
Get the shingles vaccine at your pharmacy.     Medications changes include :   none    Please followup in 6 months    Health Maintenance, Female Adopting a healthy lifestyle and getting preventive care are important in promoting health and wellness. Ask your health care provider about: The right schedule for you to have regular tests and exams. Things you can do on your own to prevent diseases and keep yourself healthy. What should I know about diet, weight, and exercise? Eat a healthy diet  Eat a diet that includes plenty of vegetables, fruits, low-fat dairy products, and lean protein. Do not eat a lot of foods that are high in solid fats, added sugars, or sodium. Maintain a healthy weight Body mass index (BMI) is used to identify weight problems. It estimates body fat based on height and weight. Your health care provider can help determine your BMI and help you achieve or maintain a healthy weight. Get regular exercise Get regular exercise. This is one of the most important things you can do for your health. Most adults should: Exercise for at least 150 minutes each week. The exercise should increase your heart rate and make you sweat (moderate-intensity exercise). Do strengthening exercises at least twice a week. This is in addition to the moderate-intensity exercise. Spend less time sitting. Even light physical activity can be beneficial. Watch cholesterol and blood lipids Have your blood tested for lipids and cholesterol at 70 years of age, then have this test every 5 years. Have your cholesterol levels checked more often if: Your lipid or cholesterol levels are high. You are older than 70 years of age. You are at high risk for heart disease. What should I know about cancer screening? Depending on your health history and family history, you may need to have cancer screening at various ages. This may include screening for: Breast cancer. Cervical cancer. Colorectal  cancer. Skin cancer. Lung cancer. What should I know about heart disease, diabetes, and high blood pressure? Blood pressure and heart disease High blood pressure causes heart disease and increases the risk of stroke. This is more likely to develop in people who have high blood pressure readings, are of African descent, or are overweight. Have your blood pressure checked: Every 3-5 years if you are 55-43 years of age. Every year if you are 2 years old or older. Diabetes Have regular diabetes screenings. This checks your fasting blood sugar level. Have the screening done: Once every three years after age 92 if you are at a normal weight and have a low risk for diabetes. More often and at a younger age if you are overweight or have a high risk for diabetes. What should I know about preventing infection? Hepatitis B If you have a higher risk for hepatitis B, you should be screened for this virus. Talk with your health care provider to find out if you are at risk for hepatitis B infection. Hepatitis C Testing is recommended for: Everyone born from [birth year] through 1965. Anyone with known risk factors for hepatitis C. Sexually transmitted infections (STIs) Get screened for STIs, including gonorrhea and chlamydia, if: You are sexually active and are younger than 70 years of age. You are older than 70 years of age and your health care provider tells you that you are at risk for this type of infection. Your sexual activity has changed since you were last screened, and you are at increased risk for chlamydia or gonorrhea. Ask your health care  provider if you are at risk. Ask your health care provider about whether you are at high risk for HIV. Your health care provider may recommend a prescription medicine to help prevent HIV infection. If you choose to take medicine to prevent HIV, you should first get tested for HIV. You should then be tested every 3 months for as long as you are taking the  medicine. Pregnancy If you are about to stop having your period (premenopausal) and you may become pregnant, seek counseling before you get pregnant. Take 400 to 800 micrograms (mcg) of folic acid every day if you become pregnant. Ask for birth control (contraception) if you want to prevent pregnancy. Osteoporosis and menopause Osteoporosis is a disease in which the bones lose minerals and strength with aging. This can result in bone fractures. If you are 54 years old or older, or if you are at risk for osteoporosis and fractures, ask your health care provider if you should: Be screened for bone loss. Take a calcium or vitamin D supplement to lower your risk of fractures. Be given hormone replacement therapy (HRT) to treat symptoms of menopause. Follow these instructions at home: Lifestyle Do not use any products that contain nicotine or tobacco, such as cigarettes, e-cigarettes, and chewing tobacco. If you need help quitting, ask your health care provider. Do not use street drugs. Do not share needles. Ask your health care provider for help if you need support or information about quitting drugs. Alcohol use Do not drink alcohol if: Your health care provider tells you not to drink. You are pregnant, may be pregnant, or are planning to become pregnant. If you drink alcohol: Limit how much you use to 0-1 drink a day. Limit intake if you are breastfeeding. Be aware of how much alcohol is in your drink. In the [place] one drink equals one 12 oz bottle of beer (355 mL), one 5 oz glass of wine (148 mL), or one 1 oz glass of hard liquor (44 mL). General instructions Schedule regular health, dental, and eye exams. Stay current with your vaccines. Tell your health care provider if: You often feel depressed. You have ever been abused or do not feel safe at home. Summary Adopting a healthy lifestyle and getting preventive care are important in promoting health and wellness. Follow your health  care provider's instructions about healthy diet, exercising, and getting tested or screened for diseases. Follow your health care provider's instructions on monitoring your cholesterol and blood pressure. This information is not intended to replace advice given to you by your health care provider. Make sure you discuss any questions you have with your health care provider. Document Revised: 02/05/2021 Document Reviewed: 11/21/2018 Elsevier Patient Education  2022 [employer].
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
## 2021-08-24 ENCOUNTER — Telehealth: Payer: Self-pay

## 2021-08-24 ENCOUNTER — Other Ambulatory Visit: Payer: Self-pay

## 2021-08-24 ENCOUNTER — Ambulatory Visit (INDEPENDENT_AMBULATORY_CARE_PROVIDER_SITE_OTHER): Payer: Medicare HMO | Admitting: Internal Medicine

## 2021-08-24 VITALS — BP 128/80 | HR 47 | Temp 98.0°F | Ht 66.0 in | Wt 161.0 lb

## 2021-08-24 DIAGNOSIS — M5416 Radiculopathy, lumbar region: Secondary | ICD-10-CM | POA: Diagnosis not present

## 2021-08-24 DIAGNOSIS — M791 Myalgia, unspecified site: Secondary | ICD-10-CM | POA: Diagnosis not present

## 2021-08-24 DIAGNOSIS — G7000 Myasthenia gravis without (acute) exacerbation: Secondary | ICD-10-CM

## 2021-08-24 DIAGNOSIS — E7849 Other hyperlipidemia: Secondary | ICD-10-CM | POA: Diagnosis not present

## 2021-08-24 DIAGNOSIS — I2510 Atherosclerotic heart disease of native coronary artery without angina pectoris: Secondary | ICD-10-CM | POA: Diagnosis not present

## 2021-08-24 DIAGNOSIS — E039 Hypothyroidism, unspecified: Secondary | ICD-10-CM | POA: Diagnosis not present

## 2021-08-24 DIAGNOSIS — I10 Essential (primary) hypertension: Secondary | ICD-10-CM | POA: Diagnosis not present

## 2021-08-24 DIAGNOSIS — E119 Type 2 diabetes mellitus without complications: Secondary | ICD-10-CM | POA: Diagnosis not present

## 2021-08-24 DIAGNOSIS — Z0000 Encounter for general adult medical examination without abnormal findings: Secondary | ICD-10-CM

## 2021-08-24 LAB — POCT GLYCOSYLATED HEMOGLOBIN (HGB A1C)
HbA1c POC (<> result, manual entry): 5.6 % (ref 4.0–5.6)
HbA1c, POC (controlled diabetic range): 5.6 % (ref 0.0–7.0)
HbA1c, POC (prediabetic range): 5.6 % — AB (ref 5.7–6.4)
Hemoglobin A1C: 5.6 % (ref 4.0–5.6)

## 2021-08-24 MED ORDER — NYSTATIN 100000 UNIT/ML MT SUSP: 5.0000 mL | Freq: Four times a day (QID) | OROMUCOSAL | 0 refills | Status: DC

## 2021-08-24 NOTE — Assessment & Plan Note (Signed)
Chronic Has been well controlled Management per neurology On Mestinon 60 mg 3 times daily

## 2021-08-24 NOTE — Assessment & Plan Note (Addendum)
Symptoms improved No longer taking gabapentin

## 2021-08-24 NOTE — Assessment & Plan Note (Signed)
Chronic No symptoms suggestive of angina Continue aspirin 81 mg daily, atorvastatin Blood pressure well controlled Regular exercise, healthy diet encouraged

## 2021-08-24 NOTE — Assessment & Plan Note (Addendum)
Chronic Diet controlled  Lab Results  Component Value Date   HGBA1C 5.6 08/24/2021   HGBA1C 5.6 08/24/2021   HGBA1C 5.6 (A) 08/24/2021   HGBA1C 5.6 08/24/2021

## 2021-08-24 NOTE — Telephone Encounter (Signed)
It was probably from steroids.  Nystatin sent to [facility]

## 2021-08-24 NOTE — Telephone Encounter (Signed)
Please advise as the pt has stated she had forgotten to mention in her visit today how she has developed thrush and cotton mouth since taking cyclobenzaprine (FLEXERIL) 10 MG tablet. Pt ask that something please be sent in to help her with this problem.

## 2021-08-24 NOTE — Assessment & Plan Note (Addendum)
Chronic ?Blood pressure well controlled ?Continue amlodipine 5 mg daily, losartan 100 mg daily ?

## 2021-08-24 NOTE — Assessment & Plan Note (Signed)
Chronic Check lipid panel, CMP Continue atorvastatin 10 mg daily Regular exercise, healthy diet
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
## 2021-08-24 NOTE — Assessment & Plan Note (Addendum)
Chronic  Clinically euthyroid Currently taking levothyroxine 100 mcg daily

## 2021-08-31 DIAGNOSIS — H04123 Dry eye syndrome of bilateral lacrimal glands: Secondary | ICD-10-CM | POA: Diagnosis not present

## 2021-08-31 DIAGNOSIS — H2513 Age-related nuclear cataract, bilateral: Secondary | ICD-10-CM | POA: Diagnosis not present

## 2021-08-31 DIAGNOSIS — H43813 Vitreous degeneration, bilateral: Secondary | ICD-10-CM | POA: Diagnosis not present

## 2021-09-29 ENCOUNTER — Ambulatory Visit (INDEPENDENT_AMBULATORY_CARE_PROVIDER_SITE_OTHER): Payer: Medicare HMO | Admitting: Internal Medicine

## 2021-09-29 ENCOUNTER — Other Ambulatory Visit: Payer: Self-pay

## 2021-09-29 ENCOUNTER — Encounter: Payer: Self-pay | Admitting: Internal Medicine

## 2021-09-29 VITALS — BP 126/88 | HR 60 | Temp 98.3°F | Ht 66.0 in | Wt 161.0 lb

## 2021-09-29 DIAGNOSIS — M62838 Other muscle spasm: Secondary | ICD-10-CM | POA: Insufficient documentation

## 2021-09-29 DIAGNOSIS — M545 Low back pain, unspecified: Secondary | ICD-10-CM | POA: Diagnosis not present

## 2021-09-29 MED ORDER — CYCLOBENZAPRINE HCL 10 MG PO TABS: 10.0000 mg | ORAL_TABLET | Freq: Three times a day (TID) | ORAL | 1 refills | Status: DC | PRN
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
## 2021-09-29 NOTE — Patient Instructions (Addendum)
      Medications changes include :   start flexeril 10 mg at night.    Your prescription(s) have been submitted to your pharmacy. Please take as directed and contact our office if you believe you are having problem(s) with the medication(s).   A referral was ordered for  [facility].      Someone from their office will call you to schedule an appointment.

## 2021-10-06 ENCOUNTER — Telehealth: Payer: Self-pay | Admitting: Internal Medicine

## 2021-10-06 DIAGNOSIS — M62838 Other muscle spasm: Secondary | ICD-10-CM

## 2021-10-06 DIAGNOSIS — M545 Low back pain, unspecified: Secondary | ICD-10-CM

## 2021-10-06 NOTE — Telephone Encounter (Signed)
Patient says she got call from the [facility] center & they advised her that they do not take her insurance  Patient is needing new referral submitted to place that does take her insurance  Please fu w/ patient [phone]

## 2021-10-09 IMAGING — MG DIGITAL DIAGNOSTIC BILAT W/ TOMO W/ CAD
8 of 14 series · 9 of 40 positions shown · non-contrast
Comparison: Previous exam(s).

CLINICAL DATA: Screening recall for bilateral breast asymmetries.

EXAM:
DIGITAL DIAGNOSTIC BILATERAL MAMMOGRAM WITH CAD AND TOMO

[L MLO synth-2D (1 of 2)]
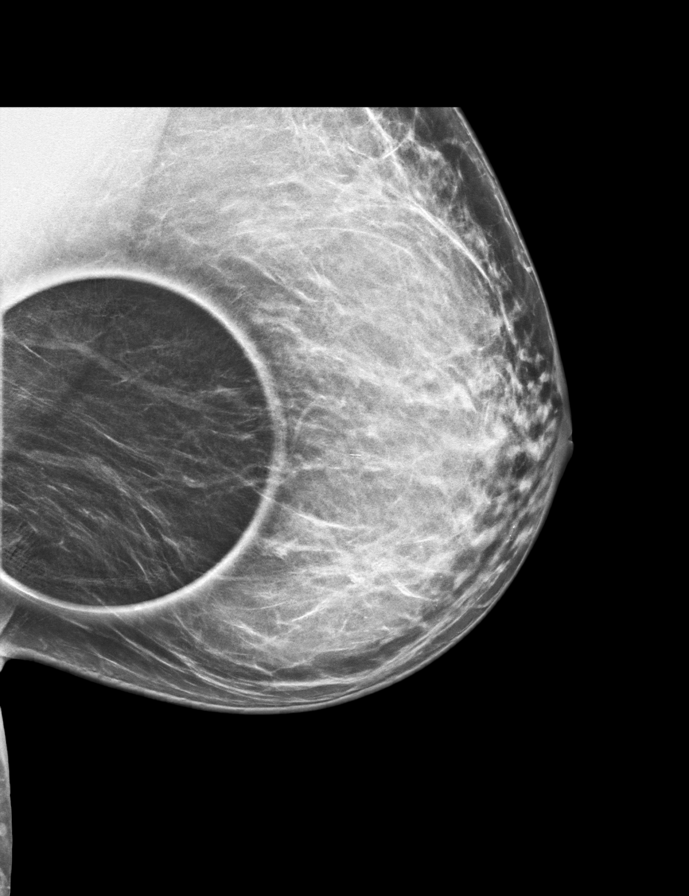

[L CC synth-2D (1 of 2)]
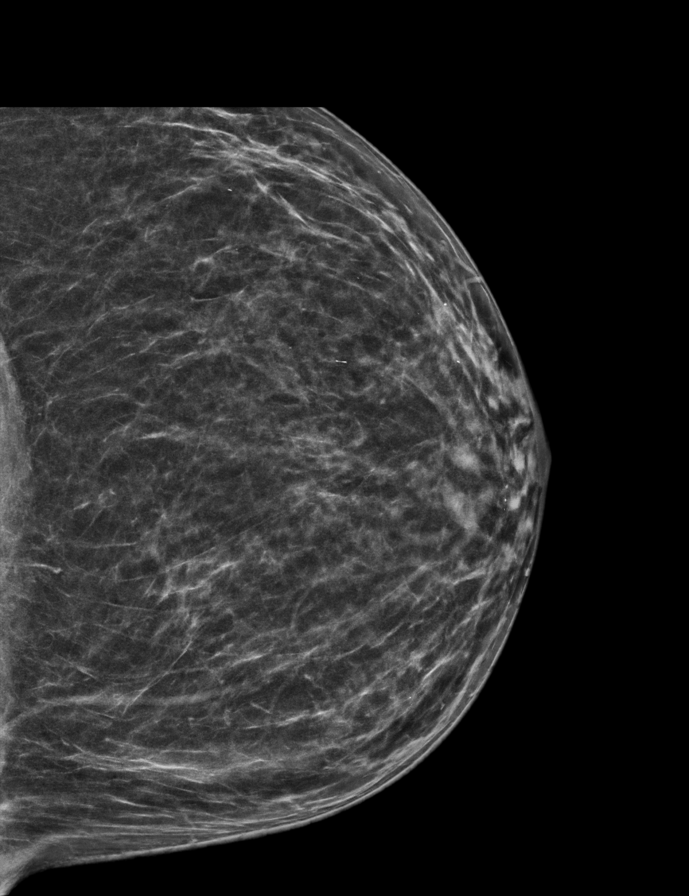

[L CC synth-2D (2 of 2)]
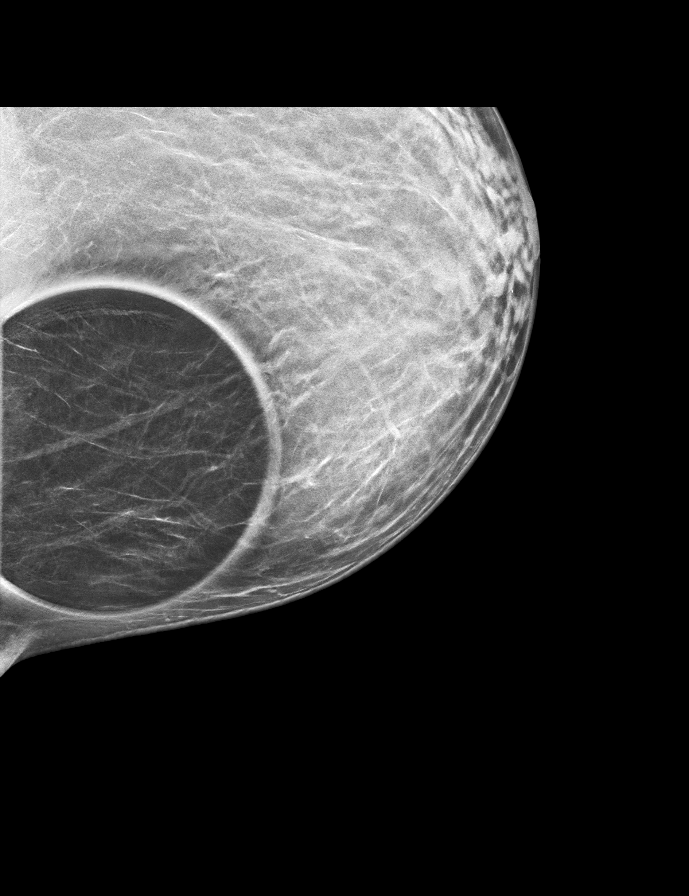

[R CC synth-2D (1 of 2)]
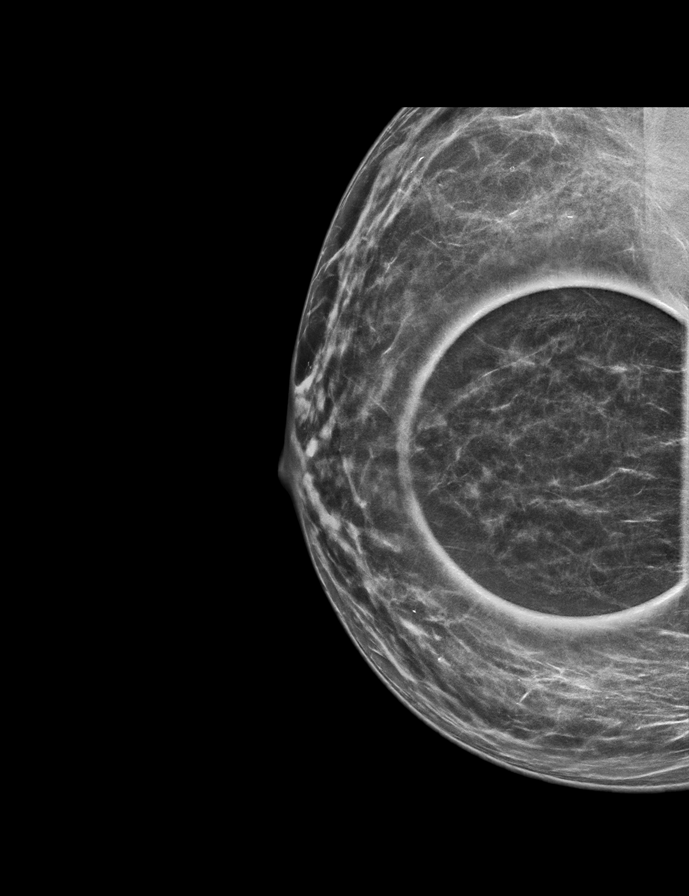

[R CC synth-2D (2 of 2)]
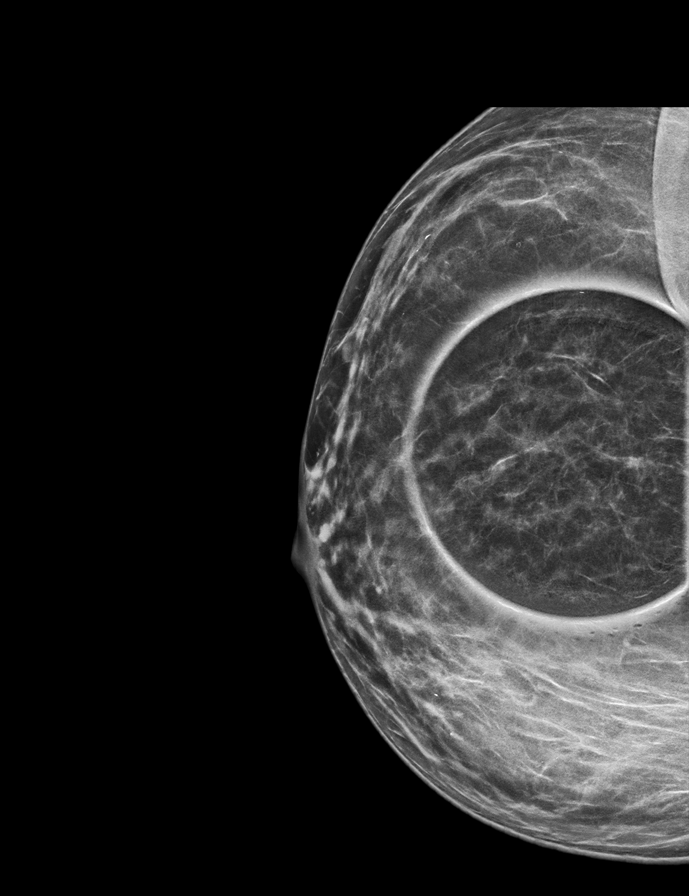

[L MLO synth-2D (2 of 2)]
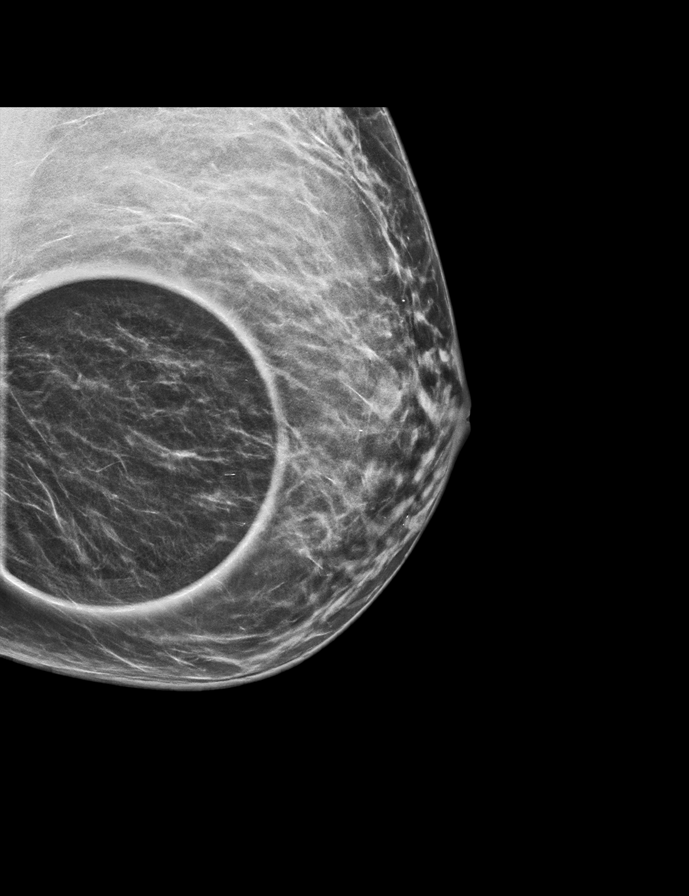

[R ML synth-2D]
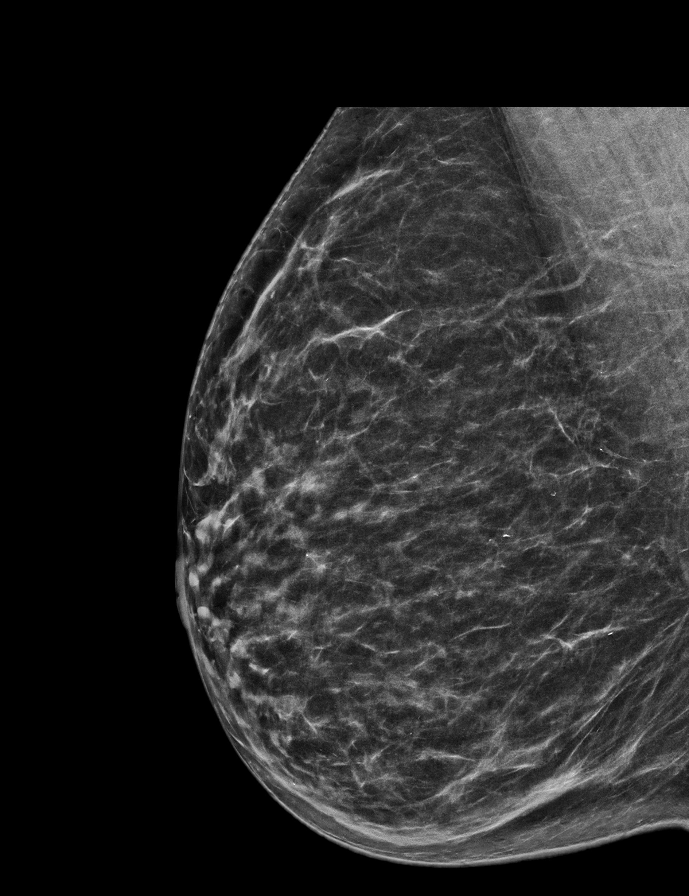

[R CC tomo · 2 of 56 frames shown]
[frame 19/56]
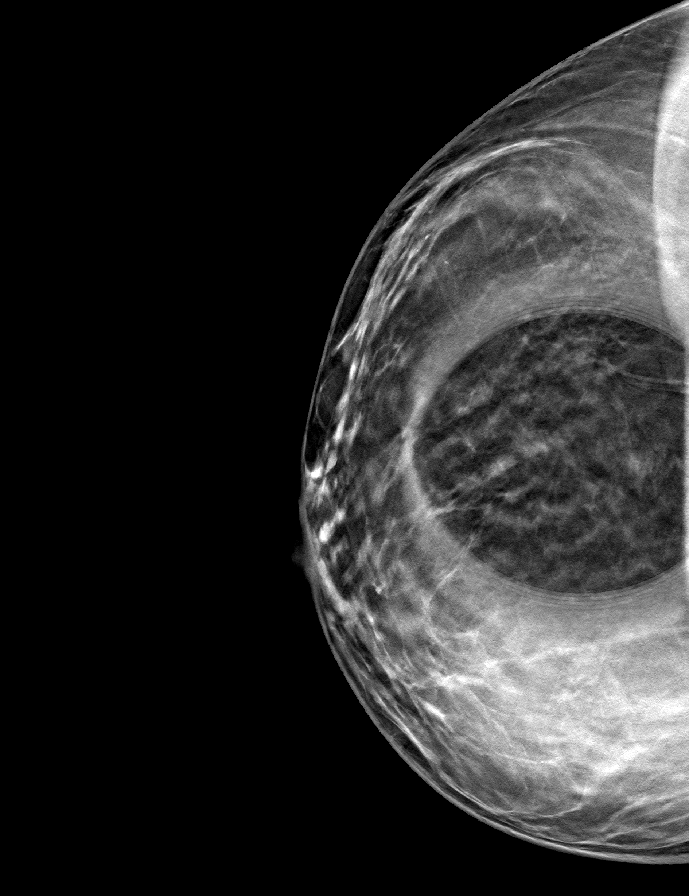
[frame 29/56]
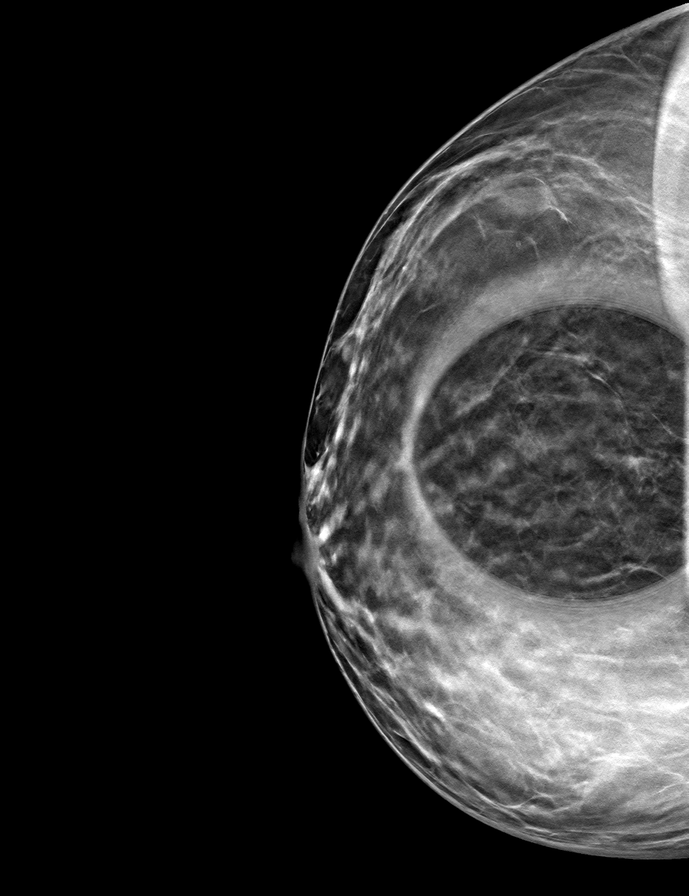

[9 of 40 positions shown; findings below may reference images not displayed]

ACR Breast Density Category b: There are scattered areas of
fibroglandular density.
FINDINGS: Spot compression tomosynthesis images through the central right
breast and lower slightly inner left breast demonstrates resolution
of the asymmetries consistent with overlapping fibroglandular
tissue. No suspicious calcifications, masses or areas of distortion
are seen in the bilateral breasts.

Mammographic images were processed with CAD.
IMPRESSION: Resolution of the bilateral asymmetries consistent with overlapping
fibroglandular tissue.

RECOMMENDATION:
Screening mammogram in one year.(Code:RZ-5-6T4)

I have discussed the findings and recommendations with the patient.
If applicable, a reminder letter will be sent to the patient
regarding the next appointment.

BI-RADS CATEGORY  1: Negative.

## 2021-10-18 ENCOUNTER — Other Ambulatory Visit: Payer: Self-pay | Admitting: Internal Medicine

## 2021-10-18 DIAGNOSIS — M4316 Spondylolisthesis, lumbar region: Secondary | ICD-10-CM | POA: Diagnosis not present

## 2021-10-18 DIAGNOSIS — M4802 Spinal stenosis, cervical region: Secondary | ICD-10-CM | POA: Diagnosis not present

## 2021-10-19 ENCOUNTER — Telehealth: Payer: Self-pay | Admitting: Pharmacist

## 2021-10-19 ENCOUNTER — Telehealth: Payer: Medicare HMO

## 2021-10-19 NOTE — Telephone Encounter (Signed)
  Chronic Care Management   Outreach Note  10/19/2021 Name: Gloria [name] MRN: [id number] DOB: 08/22/1951  Referred by: Pincus Sanes, MD  Patient had a phone appointment scheduled with clinical pharmacist today.  An unsuccessful telephone outreach was attempted today. The patient was referred to the pharmacist for assistance with medications, care management and care coordination.   Patient will NOT be penalized in any way for missing a CCM appointment. The no-show fee does not apply.  If possible, a message was left to return call to: [phone] or to Gloria [name].   Al Corpus, PharmD, Patsy [place], CPP Clinical Pharmacist Practitioner [facility] at [facility] [phone]
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
## 2021-10-20 ENCOUNTER — Telehealth: Payer: Self-pay | Admitting: Internal Medicine

## 2021-10-20 NOTE — Telephone Encounter (Signed)
1.Medication Requested: levothyroxine (SYNTHROID) 100 MCG tablet atorvastatin (LIPITOR) 10 MG tablet   2. Pharmacy (Name, Street, [place]): [facility] [phone] - [place], [place] - [address] AT [facility] [address]  3. On Med List: yes  4. Last Visit with PCP: 09-29-2021  5. Next visit date with PCP: 02-22-2021   Agent: Please be advised that RX refills may take up to 3 business days. We ask that you follow-up with your pharmacy.

## 2021-10-20 NOTE — Telephone Encounter (Signed)
Patient returning call, patient states she called number provided on vm but did not want to leave a voice message  Please call patient

## 2021-10-20 NOTE — Telephone Encounter (Signed)
Refill sent on 10/18/21.

## 2021-10-21 ENCOUNTER — Telehealth: Payer: Self-pay | Admitting: Internal Medicine

## 2021-10-21 DIAGNOSIS — M545 Low back pain, unspecified: Secondary | ICD-10-CM | POA: Diagnosis not present

## 2021-10-21 NOTE — Telephone Encounter (Signed)
Pt was sent to [facility] PT. PT found out pt. Went to another PT back in March, and company seemed to be annoyed with pt. Next appointment next week. Requesting MD to call office to clarify that visits back in March and visits now with their office are two completely separate accounts.     Callback #- [phone]

## 2021-10-26 DIAGNOSIS — M545 Low back pain, unspecified: Secondary | ICD-10-CM | POA: Diagnosis not present

## 2021-10-26 DIAGNOSIS — M542 Cervicalgia: Secondary | ICD-10-CM | POA: Diagnosis not present

## 2021-10-26 NOTE — Telephone Encounter (Signed)
Called and spoke with Gloria [name] today. She states this is the first time they have seen the patient.

## 2021-11-01 DIAGNOSIS — Z23 Encounter for immunization: Secondary | ICD-10-CM | POA: Diagnosis not present

## 2021-11-02 DIAGNOSIS — M542 Cervicalgia: Secondary | ICD-10-CM | POA: Diagnosis not present

## 2021-11-02 DIAGNOSIS — M545 Low back pain, unspecified: Secondary | ICD-10-CM | POA: Diagnosis not present

## 2021-11-08 ENCOUNTER — Ambulatory Visit (INDEPENDENT_AMBULATORY_CARE_PROVIDER_SITE_OTHER): Payer: Medicare HMO

## 2021-11-08 DIAGNOSIS — Z0000 Encounter for general adult medical examination without abnormal findings: Secondary | ICD-10-CM | POA: Diagnosis not present

## 2021-11-08 NOTE — Patient Instructions (Signed)
Gloria [name] , Thank you for taking time to come for your Medicare Wellness Visit. I appreciate your ongoing commitment to your health goals. Please review the following plan we discussed and let me know if I can assist you in the future.   Screening recommendations/referrals: Colonoscopy: Cologuard per patient  Mammogram: 01/28/2021 Bone Density: 02/01/2017 Recommended yearly ophthalmology/optometry visit for glaucoma screening and checkup Recommended yearly dental visit for hygiene and checkup  Vaccinations: Influenza vaccine: declined  Pneumococcal vaccine: completed  Tdap vaccine: due  Shingles vaccine: due will consider     Advanced directives: will provide copies   Conditions/risks identified: none   Next appointment: none    Preventive Care 65 Years and Older, Female Preventive care refers to lifestyle choices and visits with your health care provider that can promote health and wellness. What does preventive care include? A yearly physical exam. This is also called an annual well check. Dental exams once or twice a year. Routine eye exams. Ask your health care provider how often you should have your eyes checked. Personal lifestyle choices, including: Daily care of your teeth and gums. Regular physical activity. Eating a healthy diet. Avoiding tobacco and drug use. Limiting alcohol use. Practicing safe sex. Taking low-dose aspirin every day. Taking vitamin and mineral supplements as recommended by your health care provider. What happens during an annual well check? The services and screenings done by your health care provider during your annual well check will depend on your age, overall health, lifestyle risk factors, and family history of disease. Counseling  Your health care provider may ask you questions about your: Alcohol use. Tobacco use. Drug use. Emotional well-being. Home and relationship well-being. Sexual activity. Eating habits. History of  falls. Memory and ability to understand (cognition). Work and work Astronomer. Reproductive health. Screening  You may have the following tests or measurements: Height, weight, and BMI. Blood pressure. Lipid and cholesterol levels. These may be checked every 5 years, or more frequently if you are over 78 years old. Skin check. Lung cancer screening. You may have this screening every year starting at age 11 if you have a 30-pack-year history of smoking and currently smoke or have quit within the past 15 years. Fecal occult blood test (FOBT) of the stool. You may have this test every year starting at age 35. Flexible sigmoidoscopy or colonoscopy. You may have a sigmoidoscopy every 5 years or a colonoscopy every 10 years starting at age 24. Hepatitis C blood test. Hepatitis B blood test. Sexually transmitted disease (STD) testing. Diabetes screening. This is done by checking your blood sugar (glucose) after you have not eaten for a while (fasting). You may have this done every 1-3 years. Bone density scan. This is done to screen for osteoporosis. You may have this done starting at age 63. Mammogram. This may be done every 1-2 years. Talk to your health care provider about how often you should have regular mammograms. Talk with your health care provider about your test results, treatment options, and if necessary, the need for more tests. Vaccines  Your health care provider may recommend certain vaccines, such as: Influenza vaccine. This is recommended every year. Tetanus, diphtheria, and acellular pertussis (Tdap, Td) vaccine. You may need a Td booster every 10 years. Zoster vaccine. You may need this after age 44. Pneumococcal 13-valent conjugate (PCV13) vaccine. One dose is recommended after age 64. Pneumococcal polysaccharide (PPSV23) vaccine. One dose is recommended after age 52. Talk to your health care provider about which screenings  and vaccines you need and how often you need  them. This information is not intended to replace advice given to you by your health care provider. Make sure you discuss any questions you have with your health care provider. Document Released: 12/25/2015 Document Revised: 08/17/2016 Document Reviewed: 09/29/2015 Elsevier Interactive Patient Education  2017 [place] Prevention in the Home Falls can cause injuries. They can happen to people of all ages. There are many things you can do to make your home safe and to help prevent falls. What can I do on the outside of my home? Regularly fix the edges of walkways and driveways and fix any cracks. Remove anything that might make you trip as you walk through a door, such as a raised step or threshold. Trim any bushes or trees on the path to your home. Use bright outdoor lighting. Clear any walking paths of anything that might make someone trip, such as rocks or tools. Regularly check to see if handrails are loose or broken. Make sure that both sides of any steps have handrails. Any raised decks and porches should have guardrails on the edges. Have any leaves, snow, or ice cleared regularly. Use sand or salt on walking paths during winter. Clean up any spills in your garage right away. This includes oil or grease spills. What can I do in the bathroom? Use night lights. Install grab bars by the toilet and in the tub and shower. Do not use towel bars as grab bars. Use non-skid mats or decals in the tub or shower. If you need to sit down in the shower, use a plastic, non-slip stool. Keep the floor dry. Clean up any water that spills on the floor as soon as it happens. Remove soap buildup in the tub or shower regularly. Attach bath mats securely with double-sided non-slip rug tape. Do not have throw rugs and other things on the floor that can make you trip. What can I do in the bedroom? Use night lights. Make sure that you have a light by your bed that is easy to reach. Do not use  any sheets or blankets that are too big for your bed. They should not hang down onto the floor. Have a firm chair that has side arms. You can use this for support while you get dressed. Do not have throw rugs and other things on the floor that can make you trip. What can I do in the kitchen? Clean up any spills right away. Avoid walking on wet floors. Keep items that you use a lot in easy-to-reach places. If you need to reach something above you, use a strong step stool that has a grab bar. Keep electrical cords out of the way. Do not use floor polish or wax that makes floors slippery. If you must use wax, use non-skid floor wax. Do not have throw rugs and other things on the floor that can make you trip. What can I do with my stairs? Do not leave any items on the stairs. Make sure that there are handrails on both sides of the stairs and use them. Fix handrails that are broken or loose. Make sure that handrails are as long as the stairways. Check any carpeting to make sure that it is firmly attached to the stairs. Fix any carpet that is loose or worn. Avoid having throw rugs at the top or bottom of the stairs. If you do have throw rugs, attach them to the floor with carpet tape.  Make sure that you have a light switch at the top of the stairs and the bottom of the stairs. If you do not have them, ask someone to add them for you. What else can I do to help prevent falls? Wear shoes that: Do not have high heels. Have rubber bottoms. Are comfortable and fit you well. Are closed at the toe. Do not wear sandals. If you use a stepladder: Make sure that it is fully opened. Do not climb a closed stepladder. Make sure that both sides of the stepladder are locked into place. Ask someone to hold it for you, if possible. Clearly mark and make sure that you can see: Any grab bars or handrails. First and last steps. Where the edge of each step is. Use tools that help you move around (mobility aids)  if they are needed. These include: Canes. Walkers. Scooters. Crutches. Turn on the lights when you go into a dark area. Replace any light bulbs as soon as they burn out. Set up your furniture so you have a clear path. Avoid moving your furniture around. If any of your floors are uneven, fix them. If there are any pets around you, be aware of where they are. Review your medicines with your doctor. Some medicines can make you feel dizzy. This can increase your chance of falling. Ask your doctor what other things that you can do to help prevent falls. This information is not intended to replace advice given to you by your health care provider. Make sure you discuss any questions you have with your health care provider. Document Released: 09/24/2009 Document Revised: 05/05/2016 Document Reviewed: 01/02/2015 Elsevier Interactive Patient Education  2017 [employer].

## 2021-11-08 NOTE — Progress Notes (Signed)
Subjective:   Gloria [name] is a 70 y.o. female who presents for Medicare Annual (Subsequent) preventive examination.  I connected with Delainee Kishel today by telephone and verified that I am speaking with the correct person using two identifiers. Location patient: home Location provider: work Persons participating in the virtual visit: patient, provider.   I discussed the limitations, risks, security and privacy concerns of performing an evaluation and management service by telephone and the availability of in person appointments. I also discussed with the patient that there may be a patient responsible charge related to this service. The patient expressed understanding and verbally consented to this telephonic visit.    Interactive audio and video telecommunications were attempted between this provider and patient, however failed, due to patient having technical difficulties OR patient did not have access to video capability.  We continued and completed visit with audio only.    Review of Systems     Cardiac Risk Factors include: advanced age (>81men, >85 women);dyslipidemia;hypertension     Objective:    Today's Vitals   11/08/21 1521  PainSc: 2    There is no height or weight on file to calculate BMI.  Advanced Directives 11/08/2021 01/12/2021 08/19/2020 07/23/2020 07/11/2020 08/12/2019 07/25/2018  Does Patient Have a Medical Advance Directive? Yes Yes Yes Yes No Yes Yes  Type of Paramedic of [place];Living will Living will Living will Living will - [place];Living will [place];Living will  Does patient want to make changes to medical advance directive? - No - Patient declined - No - Patient declined - - -  Copy of [place] in Chart? No - copy requested - - - - No - copy requested No - copy requested  Would patient like information on creating a medical advance directive? - - - - No - Patient  declined - -    Current Medications (verified) Outpatient Encounter Medications as of 11/08/2021  Medication Sig   amLODipine (NORVASC) 5 MG tablet TAKE 1 TABLET(5 MG) BY MOUTH DAILY   atorvastatin (LIPITOR) 10 MG tablet TAKE 1 TABLET BY MOUTH DAILY AT 6 PM   Biotin 10 MG TABS Take by mouth.   cholecalciferol (VITAMIN D3) 25 MCG (1000 UNIT) tablet Take 1,000 Units by mouth daily.   diclofenac sodium (VOLTAREN) 1 % GEL Apply 4 g topically 4 (four) times daily.   levothyroxine (SYNTHROID) 100 MCG tablet TAKE 1 TABLET BY MOUTH DAILY   losartan (COZAAR) 100 MG tablet TAKE 1 TABLET(100 MG) BY MOUTH DAILY   meloxicam (MOBIC) 15 MG tablet Take 15 mg by mouth daily.   Omega-3 1000 MG CAPS Take 1 capsule by mouth daily.   pyridostigmine (MESTINON) 60 MG tablet Take 60 mg by mouth 3 (three) times daily. Per Neurologist   [facility] JOSEPH ASPIRIN PO Take 81 mg by mouth daily.   triamcinolone cream (KENALOG) 0.1 % APPLY TOPICALLY TO THE AFFECTED AREA TWICE DAILY   cyclobenzaprine (FLEXERIL) 10 MG tablet Take 1 tablet (10 mg total) by mouth 3 (three) times daily as needed for muscle spasms. (Patient not taking: Reported on 11/08/2021)   nystatin (MYCOSTATIN) 100000 UNIT/ML suspension Take 5 mLs (500,000 Units total) by mouth 4 (four) times daily. Swish and spit.  Use for 10 days (Patient not taking: Reported on 11/08/2021)   No facility-administered encounter medications on file as of 11/08/2021.    Allergies (verified) Acetaminophen and Vicodin [hydrocodone-acetaminophen]   History: Past Medical History:  Diagnosis  Date   Alopecia areata 10/2010 onset   Diabetes mellitus, type 2 ([place])    Dyslipidemia    Hypertension    Hypothyroid    Myasthenia gravis    Past Surgical History:  Procedure Laterality Date   ABDOMINAL HYSTERECTOMY  1990   Family History  Adopted: Yes  Problem Relation Age of Onset   Breast cancer Neg Hx    Social History   Socioeconomic History   Marital status: Divorced     Spouse name: Not on file   Number of children: 3   Years of education: Not on file   Highest education level: Not on file  Occupational History   Occupation: retired  Tobacco Use   Smoking status: Never   Smokeless tobacco: Never  Vaping Use   Vaping Use: Never used  Substance and Sexual Activity   Alcohol use: No   Drug use: Never   Sexual activity: Never  Other Topics Concern   Not on file  Social History Narrative   Not on file   Social Determinants of Health   Financial Resource Strain: Low Risk    Difficulty of Paying Living Expenses: Not hard at all  Food Insecurity: No Food Insecurity   Worried About Charity fundraiser in the Last Year: Never true   [place] in the Last Year: Never true  Transportation Needs: No Transportation Needs   Lack of Transportation (Medical): No   Lack of Transportation (Non-Medical): No  Physical Activity: Insufficiently Active   Days of Exercise per Week: 3 days   Minutes of Exercise per Session: 30 min  Stress: No Stress Concern Present   Feeling of Stress : Only a little  Social Connections: Moderately Integrated   Frequency of Communication with Friends and Family: Twice a week   Frequency of Social Gatherings with Friends and Family: Twice a week   Attends Religious Services: More than 4 times per year   Active Member of [employer] or Organizations: Yes   Attends Music therapist: More than 4 times per year   Marital Status: Divorced    Tobacco Counseling Counseling given: Not Answered   Clinical Intake:  Pre-visit preparation completed: Yes  Pain : 0-10 Pain Score: 2  Pain Type: Chronic pain Pain Location: Back Pain Orientation: Right Pain Descriptors / Indicators: Aching, Burning, Constant Pain Onset: More than a month ago Pain Frequency: Constant Pain Relieving Factors: Physical Therpy  Pain Relieving Factors: Physical Therpy  Nutritional Risks: None Diabetes: No  How often do you need to  have someone help you when you read instructions, pamphlets, or other written materials from your doctor or pharmacy?: 1 - Never What is the last grade level you completed in school?: [place]  Interpreter Needed?: No  Information entered by :: L.Mayelin Panos,LPN   Activities of Daily Living In your present state of health, do you have any difficulty performing the following activities: 11/08/2021 08/24/2021  Hearing? N N  Vision? N N  Difficulty concentrating or making decisions? N N  Walking or climbing stairs? N N  Dressing or bathing? N N  Doing errands, shopping? N N  Preparing Food and eating ? N -  Using the Toilet? N -  In the past six months, have you accidently leaked urine? N -  Do you have problems with loss of bowel control? N -  Managing your Medications? N -  Managing your Finances? N -  Housekeeping or managing your Housekeeping? N -  Some recent data might be hidden    Patient Care Team: Binnie Rail, MD as PCP - General (Internal Medicine) Caress, Jeneen Rinks, MD (Neurology) Gardiner Barefoot, DPM (Podiatry) Lavonna Monarch, MD (Dermatology) Clarene Essex, MD (Gastroenterology) Warden Fillers, MD as Consulting Physician (Ophthalmology) Charlton Haws, [facility] as Pharmacist (Pharmacist)  Indicate any recent Medical Services you may have received from other than [facility] providers in the past year (date may be approximate).     Assessment:   This is a routine wellness examination for Jaliana.  Hearing/Vision screen Vision Screening - Comments:: Annual eye exams wears glasses   Dietary issues and exercise activities discussed: Current Exercise Habits: Home exercise routine, Type of exercise: walking, Time (Minutes): 30, Frequency (Times/Week): 3, Weekly Exercise (Minutes/Week): 90, Intensity: Mild, Exercise limited by: None identified;orthopedic condition(s)   Goals Addressed             This Visit's Progress    Patient Stated   On track    Stay as  healthy and as independent as possible. Continue eat healthy, exercise, enjoy life and love my family.       Depression Screen PHQ 2/9 Scores 11/08/2021 11/08/2021 08/19/2020 01/06/2020 08/12/2019 07/25/2018 01/24/2018  PHQ - 2 Score 0 0 1 0 0 0 0    Fall Risk Fall Risk  11/08/2021 08/24/2021 08/19/2020 01/06/2020 11/06/2019  Falls in the past year? 0 0 0 0 0  Comment - - - - Emmi Telephone Survey: data to providers prior to load  Number falls in past yr: 0 0 0 0 -  Injury with Fall? 0 0 0 - -  Risk for fall due to : - No Fall Risks Impaired mobility - -  Risk for fall due to: Comment - - patient has a pinch nerve - -  Follow up Falls evaluation completed Falls evaluation completed Falls evaluation completed - -    FALL RISK PREVENTION PERTAINING TO THE HOME:  Any stairs in or around the home? Yes  If so, are there any without handrails? No  Home free of loose throw rugs in walkways, pet beds, electrical cords, etc? Yes  Adequate lighting in your home to reduce risk of falls? Yes   ASSISTIVE DEVICES UTILIZED TO PREVENT FALLS:  Life alert? Yes  Use of a cane, walker or w/c? No  Grab bars in the bathroom? No  Shower chair or bench in shower? No  Elevated toilet seat or a handicapped toilet? No     Cognitive Function:  Normal cognitive status assessed by direct observation by this Nurse Health Advisor. No abnormalities found.        Immunizations Immunization History  Administered Date(s) Administered   Influenza Split 09/05/2012   Influenza,inj,Quad PF,6+ Mos 11/28/2013, 10/17/2014   PFIZER(Purple Top)SARS-COV-2 Vaccination 01/23/2020, 02/17/2020   Pneumococcal Conjugate-13 01/06/2020   Pneumococcal Polysaccharide-23 09/05/2012   Td 12/12/2009    TDAP status: Due, Education has been provided regarding the importance of this vaccine. Advised may receive this vaccine at local pharmacy or Health Dept. Aware to provide a copy of the vaccination record if obtained from local  pharmacy or Health Dept. Verbalized acceptance and understanding.  Flu Vaccine status: Due, Education has been provided regarding the importance of this vaccine. Advised may receive this vaccine at local pharmacy or Health Dept. Aware to provide a copy of the vaccination record if obtained from local pharmacy or Health Dept. Verbalized acceptance and understanding.  Pneumococcal vaccine status: Up to date  Covid-19 vaccine status: Completed vaccines  Qualifies for Shingles Vaccine? Yes   Zostavax completed No   Shingrix Completed?: No.    Education has been provided regarding the importance of this vaccine. Patient has been advised to call insurance company to determine out of pocket expense if they have not yet received this vaccine. Advised may also receive vaccine at local pharmacy or Health Dept. Verbalized acceptance and understanding.  Screening Tests Health Maintenance  Topic Date Due   FOOT EXAM  01/24/2019   COVID-19 Vaccine (3 - Pfizer risk series) 03/16/2020   Pneumonia Vaccine 59+ Years old (3 - PPSV23 if available, else PCV20) 01/05/2021   COLONOSCOPY (Pts 45-26yrs Insurance coverage will need to be confirmed)  09/04/2021   Zoster Vaccines- Shingrix (1 of 2) 11/23/2021 (Originally 07/26/1970)   INFLUENZA VACCINE  03/11/2022 (Originally 07/12/2021)   TETANUS/TDAP  08/24/2022 (Originally 12/13/2019)   MAMMOGRAM  01/28/2022   DEXA SCAN  02/01/2022   HEMOGLOBIN A1C  02/21/2022   OPHTHALMOLOGY EXAM  07/08/2022   Hepatitis C Screening  Completed   HPV VACCINES  Aged Out    Health Maintenance  Health Maintenance Due  Topic Date Due   FOOT EXAM  01/24/2019   COVID-19 Vaccine (3 - Pfizer risk series) 03/16/2020   Pneumonia Vaccine 42+ Years old (3 - PPSV23 if available, else PCV20) 01/05/2021   COLONOSCOPY (Pts 45-29yrs Insurance coverage will need to be confirmed)  09/04/2021    Colorectal cancer screening: Type of screening: Cologuard. Completed 07/2021. Repeat every 3  years  Mammogram status: Completed 01/28/2021. Repeat every year  Bone Density status: Completed 02/01/2017. Results reflect: Bone density results: OSTEOPENIA. Repeat every 5 years.  Lung Cancer Screening: (Low Dose CT Chest recommended if Age 40-80 years, 30 pack-year currently smoking OR have quit w/in 15years.) does not qualify.   Lung Cancer Screening Referral: n/a  Additional Screening:  Hepatitis C Screening: does not qualify; Completed 12/22/2015  Vision Screening: Recommended annual ophthalmology exams for early detection of glaucoma and other disorders of the eye. Is the patient up to date with their annual eye exam?  Yes  Who is the provider or what is the name of the office in which the patient attends annual eye exams? Dr.Grote  If pt is not established with a provider, would they like to be referred to a provider to establish care? No .   Dental Screening: Recommended annual dental exams for proper oral hygiene  Community Resource Referral / Chronic Care Management: CRR required this visit?  No   CCM required this visit?  No      Plan:     I have personally reviewed and noted the following in the patient's chart:   Medical and social history Use of alcohol, tobacco or illicit drugs  Current medications and supplements including opioid prescriptions.  Functional ability and status Nutritional status Physical activity Advanced directives List of other physicians Hospitalizations, surgeries, and ER visits in previous 12 months Vitals Screenings to include cognitive, depression, and falls Referrals and appointments  In addition, I have reviewed and discussed with patient certain preventive protocols, quality metrics, and best practice recommendations. A written personalized care plan for preventive services as well as general preventive health recommendations were provided to patient.     March Rummage, LPN   [id number]   Nurse Notes: none

## 2021-11-09 ENCOUNTER — Telehealth: Payer: Self-pay | Admitting: Internal Medicine

## 2021-11-09 DIAGNOSIS — Z1211 Encounter for screening for malignant neoplasm of colon: Secondary | ICD-10-CM

## 2021-11-09 NOTE — Telephone Encounter (Signed)
Patient calling in  Requesting provider to put referral in to [facility] so she can have colonoscopy done   Please let patient know when referral has been placed [phone]

## 2021-11-10 NOTE — Telephone Encounter (Signed)
Referral for GI - it may take them a little while to contact her

## 2021-11-12 DIAGNOSIS — Z23 Encounter for immunization: Secondary | ICD-10-CM | POA: Diagnosis not present

## 2021-11-16 ENCOUNTER — Encounter: Payer: Self-pay | Admitting: Gastroenterology

## 2021-11-25 ENCOUNTER — Telehealth: Payer: Medicare HMO

## 2021-11-29 DIAGNOSIS — H2513 Age-related nuclear cataract, bilateral: Secondary | ICD-10-CM | POA: Diagnosis not present

## 2021-11-29 DIAGNOSIS — H43813 Vitreous degeneration, bilateral: Secondary | ICD-10-CM | POA: Diagnosis not present

## 2021-11-29 DIAGNOSIS — H04123 Dry eye syndrome of bilateral lacrimal glands: Secondary | ICD-10-CM | POA: Diagnosis not present

## 2021-12-16 ENCOUNTER — Telehealth: Payer: Self-pay | Admitting: Internal Medicine

## 2021-12-16 NOTE — Telephone Encounter (Signed)
Patient calling in  Patient wants to know if we received the results from her Cologuard test   She says she received hers via mail & wanted to know if she needed to get [place] a copy  Please call [phone]

## 2021-12-20 ENCOUNTER — Ambulatory Visit (INDEPENDENT_AMBULATORY_CARE_PROVIDER_SITE_OTHER): Payer: Medicare HMO | Admitting: Internal Medicine

## 2021-12-20 ENCOUNTER — Encounter: Payer: Self-pay | Admitting: Internal Medicine

## 2021-12-20 ENCOUNTER — Telehealth: Payer: Self-pay | Admitting: Internal Medicine

## 2021-12-20 VITALS — BP 142/82 | HR 62 | Temp 98.2°F | Ht 66.0 in | Wt 165.8 lb

## 2021-12-20 DIAGNOSIS — S46912A Strain of unspecified muscle, fascia and tendon at shoulder and upper arm level, left arm, initial encounter: Secondary | ICD-10-CM | POA: Insufficient documentation

## 2021-12-20 DIAGNOSIS — I10 Essential (primary) hypertension: Secondary | ICD-10-CM

## 2021-12-20 DIAGNOSIS — M5416 Radiculopathy, lumbar region: Secondary | ICD-10-CM | POA: Diagnosis not present

## 2021-12-20 DIAGNOSIS — M542 Cervicalgia: Secondary | ICD-10-CM | POA: Insufficient documentation

## 2021-12-20 NOTE — Assessment & Plan Note (Signed)
Chronic BP slightly elevated here today - usually better controlled - may be elevated due to pain Continue amlodipine 5 mg daily, losartan 100 mg daily

## 2021-12-20 NOTE — Progress Notes (Signed)
Subjective:    Patient ID: Gloria [name], female    DOB: Jul 14, 1951, 71 y.o.   MRN: [id number]  This visit occurred during the SARS-CoV-2 public health emergency.  Safety protocols were in place, including screening questions prior to the visit, additional usage of staff PPE, and extensive cleaning of exam room while observing appropriate contact time as indicated for disinfecting solutions.    HPI The patient is here for an acute visit.  Left arm pain and numbness - it is from the shoulder down.  She woke up with it this morning.  She put on biofreeze.  It has helped a little.   A few days ago she did do some weight activities she is not used to doing. She states that could be the cause.  The other arm does not hurt.  She does have a history of left trapezius muscle pain.   No N/T or weakness in left arm.  She does have some pain in the left side of her neck too.  Turning to the left causes neck pain.    She also continues to have lower back pain and would like to have PT for that as well.    Medications and allrgies reviewed with patient and updated if appropriate.  Patient Active Problem List   Diagnosis Date Noted   Trapezius muscle spasm 09/29/2021   Bilateral low back pain without sciatica 09/29/2021   Myalgia 08/24/2021   Bradycardia 08/20/2020   Acute cervical radiculopathy 07/09/2020   Pruritus 02/06/2020   Raynaud's phenomenon 01/06/2020   Leg cramping 07/25/2018   Left wrist tendinitis 04/30/2018   Hand numbness 01/24/2018   Lumbar radiculopathy 12/23/2016   Plantar fasciitis of right foot 06/22/2016   Carpal tunnel syndrome of right wrist 03/07/2013   Myasthenia gravis without exacerbation (HCC) 12/31/2010   ALOPECIA 12/31/2010   Hypothyroidism 12/29/2010   Type 2 diabetes, diet controlled (HCC) 12/29/2010   Hyperlipidemia 12/29/2010   Essential hypertension 12/29/2010   Coronary atherosclerosis 12/29/2010   OSTEOARTHRITIS 12/29/2010    Current  Outpatient Medications on File Prior to Visit  Medication Sig Dispense Refill   amLODipine (NORVASC) 5 MG tablet TAKE 1 TABLET(5 MG) BY MOUTH DAILY 90 tablet 1   atorvastatin (LIPITOR) 10 MG tablet TAKE 1 TABLET BY MOUTH DAILY AT 6 PM 90 tablet 0   Biotin 10 MG TABS Take by mouth.     cholecalciferol (VITAMIN D3) 25 MCG (1000 UNIT) tablet Take 1,000 Units by mouth daily.     cyclobenzaprine (FLEXERIL) 10 MG tablet Take 1 tablet (10 mg total) by mouth 3 (three) times daily as needed for muscle spasms. 30 tablet 1   diclofenac sodium (VOLTAREN) 1 % GEL Apply 4 g topically 4 (four) times daily. 100 g 5   levothyroxine (SYNTHROID) 100 MCG tablet TAKE 1 TABLET BY MOUTH DAILY 90 tablet 0   losartan (COZAAR) 100 MG tablet TAKE 1 TABLET(100 MG) BY MOUTH DAILY 90 tablet 1   meloxicam (MOBIC) 15 MG tablet Take 15 mg by mouth daily.     nystatin (MYCOSTATIN) 100000 UNIT/ML suspension Take 5 mLs (500,000 Units total) by mouth 4 (four) times daily. Swish and spit.  Use for 10 days 200 mL 0   Omega-3 1000 MG CAPS Take 1 capsule by mouth daily.     pyridostigmine (MESTINON) 60 MG tablet Take 60 mg by mouth 3 (three) times daily. Per Neurologist     ST JOSEPH ASPIRIN PO Take 81 mg by mouth daily.  triamcinolone cream (KENALOG) 0.1 % APPLY TOPICALLY TO THE AFFECTED AREA TWICE DAILY 30 g 0   No current facility-administered medications on file prior to visit.    Past Medical History:  Diagnosis Date   Alopecia areata 10/2010 onset   Diabetes mellitus, type 2 (HCC)    Dyslipidemia    Hypertension    Hypothyroid    Myasthenia gravis     Past Surgical History:  Procedure Laterality Date   ABDOMINAL HYSTERECTOMY  1990    Social History   Socioeconomic History   Marital status: Divorced    Spouse name: Not on file   Number of children: 3   Years of education: Not on file   Highest education level: Not on file  Occupational History   Occupation: retired  Tobacco Use   Smoking status: Never    Smokeless tobacco: Never  Vaping Use   Vaping Use: Never used  Substance and Sexual Activity   Alcohol use: No   Drug use: Never   Sexual activity: Never  Other Topics Concern   Not on file  Social History Narrative   Not on file   Social Determinants of Health   Financial Resource Strain: Low Risk    Difficulty of Paying Living Expenses: Not hard at all  Food Insecurity: No Food Insecurity   Worried About Programme researcher, broadcasting/film/video in the Last Year: Never true   Ran Out of Food in the Last Year: Never true  Transportation Needs: No Transportation Needs   Lack of Transportation (Medical): No   Lack of Transportation (Non-Medical): No  Physical Activity: Insufficiently Active   Days of Exercise per Week: 3 days   Minutes of Exercise per Session: 30 min  Stress: No Stress Concern Present   Feeling of Stress : Only a little  Social Connections: Moderately Integrated   Frequency of Communication with Friends and Family: Twice a week   Frequency of Social Gatherings with Friends and Family: Twice a week   Attends Religious Services: More than 4 times per year   Active Member of [employer] or Organizations: Yes   Attends Engineer, structural: More than 4 times per year   Marital Status: Divorced    Family History  Adopted: Yes  Problem Relation Age of Onset   Breast cancer Neg Hx     Review of Systems  Constitutional:  Negative for fever.  Respiratory:  Negative for cough, shortness of breath and wheezing.   Cardiovascular:  Negative for chest pain and palpitations.  Neurological:  Negative for weakness and numbness.      Objective:   Vitals:   12/20/21 1345  BP: (!) 142/82  Pulse: 62  Temp: 98.2 F (36.8 C)  SpO2: 99%   BP Readings from Last 3 Encounters:  12/20/21 (!) 142/82  09/29/21 126/88  08/24/21 128/80   Wt Readings from Last 3 Encounters:  12/20/21 165 lb 12.8 oz (75.2 kg)  09/29/21 161 lb (73 kg)  08/24/21 161 lb (73 kg)   Body mass index is  26.76 kg/m.   Physical Exam Constitutional:      General: She is not in acute distress.    Appearance: Normal appearance. She is not ill-appearing.  HENT:     Head: Normocephalic and atraumatic.  Cardiovascular:     Rate and Rhythm: Normal rate and regular rhythm.  Pulmonary:     Effort: Pulmonary effort is normal.  Musculoskeletal:     Comments: Tenderness with palpation left side of neck -  increased pain when looking left.  Tenderness left shoulder joint - increased pain with certain movements.  Left wrist pain with movement.    Skin:    General: Skin is warm and dry.     Findings: No bruising or erythema.  Neurological:     Mental Status: She is alert.     Sensory: No sensory deficit.     Motor: No weakness.           Assessment & Plan:    See Problem List for Assessment and Plan of chronic medical problems.

## 2021-12-20 NOTE — Patient Instructions (Signed)
° °  Take the meloxicam as needed.  Use heat or ice.  Apply biofreeze.     A referral was ordered for [facility] physical therapy.       Someone from their office will call you to schedule an appointment.

## 2021-12-20 NOTE — Assessment & Plan Note (Signed)
Acute Left shoulder strain - likely from lifting weights Refer to PT Has meloxicam she can take Heat, ice, biofreeze

## 2021-12-20 NOTE — Assessment & Plan Note (Signed)
Chronic Lower back pain Going to PT - will include lower back

## 2021-12-20 NOTE — Telephone Encounter (Signed)
Patient calling in complaining of left arm pain & numbness  Patient says she wants to be seen soon as this may be sign of stroke  Offered 1st available w/ provider 01/16 & patient declined  Says she wants nurse to cb to see if she can be fit in sometime this week on provider schedule  Please call [phone]

## 2021-12-20 NOTE — Telephone Encounter (Signed)
Spoke with patient today.  She will bring copy of results by office as I was not able to locate her on the website.

## 2021-12-20 NOTE — Assessment & Plan Note (Signed)
Acute Strain -  - likely from lifting weights Refer to PT Has meloxicam she can take Heat, ice, biofreeze

## 2021-12-29 ENCOUNTER — Other Ambulatory Visit: Payer: Self-pay | Admitting: Internal Medicine

## 2021-12-29 DIAGNOSIS — M542 Cervicalgia: Secondary | ICD-10-CM | POA: Diagnosis not present

## 2021-12-29 DIAGNOSIS — M545 Low back pain, unspecified: Secondary | ICD-10-CM | POA: Diagnosis not present

## 2021-12-30 ENCOUNTER — Other Ambulatory Visit: Payer: Self-pay

## 2021-12-30 ENCOUNTER — Ambulatory Visit (AMBULATORY_SURGERY_CENTER): Payer: Medicare HMO

## 2021-12-30 VITALS — Ht 66.0 in | Wt 164.0 lb

## 2021-12-30 DIAGNOSIS — Z1211 Encounter for screening for malignant neoplasm of colon: Secondary | ICD-10-CM

## 2021-12-30 MED ORDER — PEG 3350-KCL-NA BICARB-NACL 420 G PO SOLR: 4000.0000 mL | Freq: Once | ORAL | 0 refills | Status: AC

## 2021-12-30 NOTE — Progress Notes (Signed)
No egg or soy allergy known to patient  °No issues known to pt with past sedation with any surgeries or procedures °Patient denies ever being told they had issues or difficulty with intubation  °No FH of Malignant Hyperthermia °Pt is not on diet pills °Pt is not on home 02  °Pt is not on blood thinners  °Pt denies issues with constipation  °No A fib or A flutter °Pt is fully vaccinated for Covid x 2; °NO PA's for preps discussed with pt in PV today  °Discussed with pt there will be an out-of-pocket cost for prep and that varies from $0 to 70 + dollars - pt verbalized understanding  °Due to the COVID-19 pandemic we are asking patients to follow certain guidelines in PV and the [facility]   °Pt aware of COVID protocols and [facility] guidelines  °PV completed over the phone. Pt verified name, DOB, address and insurance during PV today.  °Pt mailed instruction packet with copy of consent form to read and not return, and instructions.  °Pt encouraged to call with questions or issues.  °If pt has My chart, procedure instructions sent via My Chart  ° °

## 2022-01-03 DIAGNOSIS — M47816 Spondylosis without myelopathy or radiculopathy, lumbar region: Secondary | ICD-10-CM | POA: Diagnosis not present

## 2022-01-05 ENCOUNTER — Ambulatory Visit (INDEPENDENT_AMBULATORY_CARE_PROVIDER_SITE_OTHER): Payer: Medicare HMO

## 2022-01-05 DIAGNOSIS — E7849 Other hyperlipidemia: Secondary | ICD-10-CM

## 2022-01-05 DIAGNOSIS — I2510 Atherosclerotic heart disease of native coronary artery without angina pectoris: Secondary | ICD-10-CM

## 2022-01-05 DIAGNOSIS — I10 Essential (primary) hypertension: Secondary | ICD-10-CM

## 2022-01-05 DIAGNOSIS — E039 Hypothyroidism, unspecified: Secondary | ICD-10-CM

## 2022-01-05 DIAGNOSIS — M1990 Unspecified osteoarthritis, unspecified site: Secondary | ICD-10-CM
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
## 2022-01-05 NOTE — Patient Instructions (Signed)
Visit Information  Following are the goals we discussed today:   Manage My Medication  Timeframe:  Long-Range Goal Priority:  Medium Start Date:          02/22/21                   Expected End Date:      01/05/2023               Follow Up Date 07/05/2022   - call for medicine refill 2 or 3 days before it runs out - call if I am sick and can't take my medicine - keep a list of all the medicines I take; vitamins and herbals too  -take all medications at bedtime except levothyroxine (take in AM on empty stomach)  Why is this important?   These steps will help you keep on track with your medicines.  Plan: Telephone follow up appointment with care management team member scheduled for:  6 months  The patient has been provided with contact information for the care management team and has been advised to call with any health related questions or concerns.   Ellin Saba, PharmD Clinical Pharmacist, [facility]   Please call the care guide team at [phone] if you need to cancel or reschedule your appointment.   The patient verbalized understanding of instructions, educational materials, and care plan provided today and agreed to receive a mailed copy of patient instructions, educational materials, and care plan.

## 2022-01-11 DIAGNOSIS — I10 Essential (primary) hypertension: Secondary | ICD-10-CM | POA: Diagnosis not present

## 2022-01-11 DIAGNOSIS — E039 Hypothyroidism, unspecified: Secondary | ICD-10-CM | POA: Diagnosis not present

## 2022-01-11 DIAGNOSIS — I2510 Atherosclerotic heart disease of native coronary artery without angina pectoris: Secondary | ICD-10-CM | POA: Diagnosis not present

## 2022-01-11 DIAGNOSIS — E7849 Other hyperlipidemia: Secondary | ICD-10-CM

## 2022-01-11 DIAGNOSIS — M1990 Unspecified osteoarthritis, unspecified site: Secondary | ICD-10-CM | POA: Diagnosis not present

## 2022-01-13 ENCOUNTER — Encounter: Payer: Self-pay | Admitting: Gastroenterology

## 2022-01-13 ENCOUNTER — Other Ambulatory Visit: Payer: Self-pay

## 2022-01-13 ENCOUNTER — Ambulatory Visit (AMBULATORY_SURGERY_CENTER): Payer: Medicare HMO | Admitting: Gastroenterology

## 2022-01-13 VITALS — BP 116/65 | HR 50 | Temp 95.7°F | Resp 16 | Ht 66.0 in | Wt 164.0 lb

## 2022-01-13 DIAGNOSIS — Z1211 Encounter for screening for malignant neoplasm of colon: Secondary | ICD-10-CM | POA: Diagnosis not present

## 2022-01-13 MED ORDER — SODIUM CHLORIDE 0.9 % IV SOLN: 500.0000 mL | Freq: Once | INTRAVENOUS | Status: DC

## 2022-01-13 NOTE — Progress Notes (Signed)
Pt's states no medical or surgical changes since previsit or office visit. 

## 2022-01-13 NOTE — Progress Notes (Signed)
GASTROENTEROLOGY PROCEDURE H&P NOTE   Primary Care Physician: Pincus SanesBurns, Stacy J, MD  HPI: [name] is a 71 y.o. female who presents for Colonoscopy for screening.  Past Medical History:  Diagnosis Date   Alopecia areata 10/2010 onset   Diabetes mellitus, type 2 (HCC)    on meds   Dyslipidemia    Hyperlipidemia    on meds   Hypertension    on meds   Hypothyroid    on meds   Myasthenia gravis    Past Surgical History:  Procedure Laterality Date   ABDOMINAL HYSTERECTOMY  12/12/1988   CARPAL TUNNEL RELEASE Left    CARPAL TUNNEL RELEASE Right    COLONOSCOPY  2008   [facility]   Current Outpatient Medications  Medication Sig Dispense Refill   amLODipine (NORVASC) 5 MG tablet TAKE 1 TABLET(5 MG) BY MOUTH DAILY 90 tablet 1   Ascorbic Acid (VITAMIN C PO) Take 1 tablet by mouth daily at 6 (six) AM.     atorvastatin (LIPITOR) 10 MG tablet TAKE 1 TABLET BY MOUTH DAILY AT 6 PM 90 tablet 0   Biotin 1000 MCG CHEW Chew 2 each by mouth daily. (Patient not taking: Reported on 01/05/2022)     CALCIUM-MAGNESIUM-ZINC PO Take 1 tablet by mouth daily at 6 (six) AM.     cholecalciferol (VITAMIN D3) 25 MCG (1000 UNIT) tablet Take 1,000 Units by mouth daily.     Cyanocobalamin (VITAMIN B 12 PO) Take 1 tablet by mouth daily at 6 (six) AM.     diclofenac sodium (VOLTAREN) 1 % GEL Apply 4 g topically 4 (four) times daily. (Patient taking differently: Apply 4 g topically 4 (four) times daily as needed.) 100 g 5   Flaxseed, Linseed, (FLAX SEED OIL PO) Take 1,000 mg by mouth daily at 6 (six) AM.     levOCARNitine (CARNITINE PO) Take 1 tablet by mouth daily at 6 (six) AM.     levothyroxine (SYNTHROID) 100 MCG tablet TAKE 1 TABLET BY MOUTH DAILY 90 tablet 0   losartan (COZAAR) 100 MG tablet TAKE 1 TABLET(100 MG) BY MOUTH DAILY 90 tablet 1   meloxicam (MOBIC) 15 MG tablet Take 15 mg by mouth daily.     Misc Natural Products (SAMBUCUS ELDERBERRY IMMUNE PO) Take 2 tablets by mouth daily at 2 am.  (Patient not taking: Reported on 01/05/2022)     Multiple Vitamins-Minerals (WOMENS 50+ MULTI VITAMIN PO) Take 1 tablet by mouth daily at 6 (six) AM.     Omega-3 1000 MG CAPS Take 1 capsule by mouth daily.     pyridostigmine (MESTINON) 60 MG tablet Take 60 mg by mouth 3 (three) times daily as needed. Per Neurologist     ST JOSEPH ASPIRIN PO Take 81 mg by mouth daily.     triamcinolone cream (KENALOG) 0.1 % APPLY TOPICALLY TO THE AFFECTED AREA TWICE DAILY 30 g 0   VITAMIN E PO Take 1 tablet by mouth daily at 6 (six) AM.     Current Facility-Administered Medications  Medication Dose Route Frequency Provider Last Rate Last Admin   0.9 %  sodium chloride infusion  500 mL Intravenous Once Mansouraty, Netty StarringGabriel Jr., MD        Current Outpatient Medications:    amLODipine (NORVASC) 5 MG tablet, TAKE 1 TABLET(5 MG) BY MOUTH DAILY, Disp: 90 tablet, Rfl: 1   Ascorbic Acid (VITAMIN C PO), Take 1 tablet by mouth daily at 6 (six) AM., Disp: , Rfl:    atorvastatin (  LIPITOR) 10 MG tablet, TAKE 1 TABLET BY MOUTH DAILY AT 6 PM, Disp: 90 tablet, Rfl: 0   Biotin 1000 MCG CHEW, Chew 2 each by mouth daily. (Patient not taking: Reported on 01/05/2022), Disp: , Rfl:    CALCIUM-MAGNESIUM-ZINC PO, Take 1 tablet by mouth daily at 6 (six) AM., Disp: , Rfl:    cholecalciferol (VITAMIN D3) 25 MCG (1000 UNIT) tablet, Take 1,000 Units by mouth daily., Disp: , Rfl:    Cyanocobalamin (VITAMIN B 12 PO), Take 1 tablet by mouth daily at 6 (six) AM., Disp: , Rfl:    diclofenac sodium (VOLTAREN) 1 % GEL, Apply 4 g topically 4 (four) times daily. (Patient taking differently: Apply 4 g topically 4 (four) times daily as needed.), Disp: 100 g, Rfl: 5   Flaxseed, Linseed, (FLAX SEED OIL PO), Take 1,000 mg by mouth daily at 6 (six) AM., Disp: , Rfl:    levOCARNitine (CARNITINE PO), Take 1 tablet by mouth daily at 6 (six) AM., Disp: , Rfl:    levothyroxine (SYNTHROID) 100 MCG tablet, TAKE 1 TABLET BY MOUTH DAILY, Disp: 90 tablet, Rfl: 0    losartan (COZAAR) 100 MG tablet, TAKE 1 TABLET(100 MG) BY MOUTH DAILY, Disp: 90 tablet, Rfl: 1   meloxicam (MOBIC) 15 MG tablet, Take 15 mg by mouth daily., Disp: , Rfl:    Misc Natural Products (SAMBUCUS ELDERBERRY IMMUNE PO), Take 2 tablets by mouth daily at 2 am. (Patient not taking: Reported on 01/05/2022), Disp: , Rfl:    Multiple Vitamins-Minerals (WOMENS 50+ MULTI VITAMIN PO), Take 1 tablet by mouth daily at 6 (six) AM., Disp: , Rfl:    Omega-3 1000 MG CAPS, Take 1 capsule by mouth daily., Disp: , Rfl:    pyridostigmine (MESTINON) 60 MG tablet, Take 60 mg by mouth 3 (three) times daily as needed. Per Neurologist, Disp: , Rfl:    ST JOSEPH ASPIRIN PO, Take 81 mg by mouth daily., Disp: , Rfl:    triamcinolone cream (KENALOG) 0.1 %, APPLY TOPICALLY TO THE AFFECTED AREA TWICE DAILY, Disp: 30 g, Rfl: 0   VITAMIN E PO, Take 1 tablet by mouth daily at 6 (six) AM., Disp: , Rfl:   Current Facility-Administered Medications:    0.9 %  sodium chloride infusion, 500 mL, Intravenous, Once, Mansouraty, Netty Starring., MD Allergies  Allergen Reactions   Vicodin [Hydrocodone-Acetaminophen] Swelling    Facial swelling   Family History  Problem Relation Age of Onset   Colon cancer Neg Hx    Colon polyps Neg Hx    Esophageal cancer Neg Hx    Rectal cancer Neg Hx    Stomach cancer Neg Hx    Social History   Socioeconomic History   Marital status: Divorced    Spouse name: Not on file   Number of children: 3   Years of education: Not on file   Highest education level: Not on file  Occupational History   Occupation: retired  Tobacco Use   Smoking status: Never   Smokeless tobacco: Never  Vaping Use   Vaping Use: Never used  Substance and Sexual Activity   Alcohol use: No   Drug use: Never   Sexual activity: Never  Other Topics Concern   Not on file  Social History Narrative   Not on file   Social Determinants of Health   Financial Resource Strain: Low Risk    Difficulty of Paying  Living Expenses: Not hard at all  Food Insecurity: No Food Insecurity   Worried  About Running Out of Food in the Last Year: Never true   Ran Out of Food in the Last Year: Never true  Transportation Needs: No Transportation Needs   Lack of Transportation (Medical): No   Lack of Transportation (Non-Medical): No  Physical Activity: Insufficiently Active   Days of Exercise per Week: 3 days   Minutes of Exercise per Session: 30 min  Stress: No Stress Concern Present   Feeling of Stress : Only a little  Social Connections: Moderately Integrated   Frequency of Communication with Friends and Family: Twice a week   Frequency of Social Gatherings with Friends and Family: Twice a week   Attends Religious Services: More than 4 times per year   Active Member of [employer] or Organizations: Yes   Attends Engineer, structural: More than 4 times per year   Marital Status: Divorced  Catering manager Violence: Not At Risk   Fear of Current or Ex-Partner: No   Emotionally Abused: No   Physically Abused: No   Sexually Abused: No    Physical Exam: Today's Vitals   01/13/22 0818 01/13/22 0831  BP: (!) 153/62   Temp:  (!) 95.7 F (35.4 C)  Weight: 164 lb (74.4 kg)   Height: 5\' 6"  (1.676 m)    Body mass index is 26.47 kg/m. GEN: NAD EYE: Sclerae anicteric ENT: MMM CV: Non-tachycardic GI: Soft, NT/ND NEURO:  Alert & Oriented x 3  Lab Results: No results for input(s): WBC, HGB, HCT, PLT in the last 72 hours. BMET No results for input(s): NA, K, CL, CO2, GLUCOSE, BUN, CREATININE, CALCIUM in the last 72 hours. LFT No results for input(s): PROT, ALBUMIN, AST, ALT, ALKPHOS, BILITOT, BILIDIR, IBILI in the last 72 hours. PT/INR No results for input(s): LABPROT, INR in the last 72 hours.   Impression / Plan: This is a 71 y.o.female who presents for Colonoscopy for screening.  The risks and benefits of endoscopic evaluation/treatment were discussed with the patient and/or family; these  include but are not limited to the risk of perforation, infection, bleeding, missed lesions, lack of diagnosis, severe illness requiring hospitalization, as well as anesthesia and sedation related illnesses.  The patient's history has been reviewed, patient examined, no change in status, and deemed stable for procedure.  The patient and/or family is agreeable to proceed.    66, MD  Gastroenterology Advanced [facility] # [place]

## 2022-01-13 NOTE — Progress Notes (Signed)
PT taken to PACU. Monitors in place. VSS. Report given to RN. 

## 2022-01-13 NOTE — Patient Instructions (Signed)
No polyps today!!  Please read over handouts about diverticulosis, hemorrhoids and high fiber diets  Continue your normal medications- use FiberCon 1-2 tablets daily   YOU HAD AN ENDOSCOPIC PROCEDURE TODAY AT THE [facility]:   Refer to the procedure report that was given to you for any specific questions about what was found during the examination.  If the procedure report does not answer your questions, please call your gastroenterologist to clarify.  If you requested that your care partner not be given the details of your procedure findings, then the procedure report has been included in a sealed envelope for you to review at your convenience later.  YOU SHOULD EXPECT: Some feelings of bloating in the abdomen. Passage of more gas than usual.  Walking can help get rid of the air that was put into your GI tract during the procedure and reduce the bloating. If you had a lower endoscopy (such as a colonoscopy or flexible sigmoidoscopy) you may notice spotting of blood in your stool or on the toilet paper. If you underwent a bowel prep for your procedure, you may not have a normal bowel movement for a few days.  Please Note:  You might notice some irritation and congestion in your nose or some drainage.  This is from the oxygen used during your procedure.  There is no need for concern and it should clear up in a day or so.  SYMPTOMS TO REPORT IMMEDIATELY:  Following lower endoscopy (colonoscopy or flexible sigmoidoscopy):  Excessive amounts of blood in the stool  Significant tenderness or worsening of abdominal pains  Swelling of the abdomen that is new, acute  Fever of 100F or higher For urgent or emergent issues, a gastroenterologist can be reached at any hour by calling [phone]. Do not use MyChart messaging for urgent concerns.    DIET:  We do recommend a small meal at first, but then you may proceed to your regular diet.  Drink plenty of fluids but you should avoid  alcoholic beverages for 24 hours.  ACTIVITY:  You should plan to take it easy for the rest of today and you should NOT DRIVE or use heavy machinery until tomorrow (because of the sedation medicines used during the test).    FOLLOW UP: Our staff will call the number listed on your records 48-72 hours following your procedure to check on you and address any questions or concerns that you may have regarding the information given to you following your procedure. If we do not reach you, we will leave a message.  We will attempt to reach you two times.  During this call, we will ask if you have developed any symptoms of COVID 19. If you develop any symptoms (ie: fever, flu-like symptoms, shortness of breath, cough etc.) before then, please call [phone].  If you test positive for Covid 19 in the 2 weeks post procedure, please call and report this information to [place].     SIGNATURES/CONFIDENTIALITY: You and/or your care partner have signed paperwork which will be entered into your electronic medical record.  These signatures attest to the fact that that the information above on your After Visit Summary has been reviewed and is understood.  Full responsibility of the confidentiality of this discharge information lies with you and/or your care-partner.

## 2022-01-13 NOTE — Progress Notes (Signed)
D.T. vital signs. °

## 2022-01-13 NOTE — Op Note (Signed)
[facility] Patient Name: Gloria [name] Procedure Date: 01/13/2022 8:36 AM MRN: [id number] Endoscopist: Justice Britain , MD Age: 71 Referring MD:  Date of Birth: 06-29-1951 Gender: Female Account #: [id number] Procedure:                Colonoscopy Indications:              Screening for colorectal malignant neoplasm Medicines:                Monitored Anesthesia Care Procedure:                Pre-Anesthesia Assessment:                           - Prior to the procedure, a History and Physical                            was performed, and patient medications and                            allergies were reviewed. The patient's tolerance of                            previous anesthesia was also reviewed. The risks                            and benefits of the procedure and the sedation                            options and risks were discussed with the patient.                            All questions were answered, and informed consent                            was obtained. Prior Anticoagulants: The patient has                            taken no previous anticoagulant or antiplatelet                            agents except for NSAID medication. ASA Grade                            Assessment: II - A patient with mild systemic                            disease. After reviewing the risks and benefits,                            the patient was deemed in satisfactory condition to                            undergo the procedure.  After obtaining informed consent, the colonoscope                            was passed under direct vision. Throughout the                            procedure, the patient's blood pressure, pulse, and                            oxygen saturations were monitored continuously. The                            Colonoscope was introduced through the anus and                            advanced to the 5 cm into the ileum.  The                            colonoscopy was somewhat difficult due to                            significant looping. Successful completion of the                            procedure was aided by changing the patient's                            position, using manual pressure, straightening and                            shortening the scope to obtain bowel loop reduction                            and using scope torsion. The patient tolerated the                            procedure. The quality of the bowel preparation was                            adequate. The terminal ileum, ileocecal valve,                            appendiceal orifice, and rectum were photographed. Scope In: 8:51:51 AM Scope Out: 9:08:12 AM Total Procedure Duration: 0 hours 16 minutes 21 seconds  Findings:                 The digital rectal exam findings include                            hemorrhoids. Pertinent negatives include no                            palpable rectal lesions.  The terminal ileum and ileocecal valve appeared                            normal.                           Multiple small-mouthed diverticula were found in                            the recto-sigmoid colon, sigmoid colon and                            descending colon.                           Normal mucosa was found in the entire colon.                           Non-bleeding non-thrombosed internal hemorrhoids                            were found during digital exam. The hemorrhoids                            were Grade II (internal hemorrhoids that prolapse                            but reduce spontaneously). Complications:            No immediate complications. Estimated Blood Loss:     Estimated blood loss: none. Impression:               - Hemorrhoids found on digital rectal exam.                           - The examined portion of the ileum was normal.                           -  Diverticulosis in the recto-sigmoid colon, in the                            sigmoid colon and in the descending colon.                           - Normal mucosa in the entire examined colon.                           - Non-bleeding non-thrombosed internal hemorrhoids. Recommendation:           - The patient will be observed post-procedure,                            until all discharge criteria are met.                           - Discharge patient to home.                           -  Patient has a contact number available for                            emergencies. The signs and symptoms of potential                            delayed complications were discussed with the                            patient. Return to normal activities tomorrow.                            Written discharge instructions were provided to the                            patient.                           - High fiber diet.                           - Use FiberCon 1-2 tablets PO daily.                           - Continue present medications.                           - Repeat colonoscopy in 10 years for screening                            purposes. You will be 80 at that time. If you are                            still as healthy as you are today we should                            consider 1 last colonoscopy at that time, otherwise                            if health is not as good we may not continue                            colonoscopy for screening purposes.                           - The findings and recommendations were discussed                            with the patient.                           - The findings and recommendations were discussed                            with the patient's family.  Justice Britain, MD 01/13/2022 9:12:39 AM

## 2022-01-17 ENCOUNTER — Other Ambulatory Visit: Payer: Self-pay | Admitting: Internal Medicine

## 2022-01-17 ENCOUNTER — Telehealth: Payer: Self-pay

## 2022-01-17 ENCOUNTER — Telehealth: Payer: Self-pay | Admitting: *Deleted

## 2022-01-17 NOTE — Telephone Encounter (Signed)
°  Follow up Call-  Call back number 01/13/2022  Post procedure Call Back phone  # [phone]  Permission to leave phone message Yes  Some recent data might be hidden     First follow up attempt, LVM

## 2022-01-17 NOTE — Telephone Encounter (Signed)
°  Follow up Call-  Call back number 01/13/2022  Post procedure Call Back phone  # [phone]  Permission to leave phone message Yes  Some recent data might be hidden     Patient questions:  Do you have a fever, pain , or abdominal swelling? No. Pain Score  0 *  Have you tolerated food without any problems? Yes.    Have you been able to return to your normal activities? Yes.    Do you have any questions about your discharge instructions: Diet   No. Medications  No. Follow up visit  No.  Do you have questions or concerns about your Care? No.  Actions: * If pain score is 4 or above: No action needed, pain <4.  Have you developed a fever since your procedure? no  2.   Have you had an respiratory symptoms (SOB or cough) since your procedure? no  3.   Have you tested positive for COVID 19 since your procedure no  4.   Have you had any family members/close contacts diagnosed with the COVID 19 since your procedure?  no   If yes to any of these questions please route to Laverna Peace, RN and Karlton Lemon, RN

## 2022-01-24 ENCOUNTER — Encounter: Payer: Self-pay | Admitting: Internal Medicine

## 2022-01-24 DIAGNOSIS — M4316 Spondylolisthesis, lumbar region: Secondary | ICD-10-CM | POA: Diagnosis not present

## 2022-01-24 DIAGNOSIS — M47816 Spondylosis without myelopathy or radiculopathy, lumbar region: Secondary | ICD-10-CM | POA: Diagnosis not present

## 2022-01-24 NOTE — Progress Notes (Signed)
Outside notes received. Information abstracted. Notes sent to scan.  

## 2022-02-02 DIAGNOSIS — M542 Cervicalgia: Secondary | ICD-10-CM | POA: Diagnosis not present

## 2022-02-02 DIAGNOSIS — M545 Low back pain, unspecified: Secondary | ICD-10-CM | POA: Diagnosis not present

## 2022-02-10 DIAGNOSIS — Z79899 Other long term (current) drug therapy: Secondary | ICD-10-CM | POA: Diagnosis not present

## 2022-02-10 DIAGNOSIS — G7000 Myasthenia gravis without (acute) exacerbation: Secondary | ICD-10-CM | POA: Diagnosis not present

## 2022-02-21 NOTE — Progress Notes (Unsigned)
Subjective:    Patient ID: Gloria [name], female    DOB: Jun 07, 1951, 71 y.o.   MRN: [id number]  This visit occurred during the SARS-CoV-2 public health emergency.  Safety protocols were in place, including screening questions prior to the visit, additional usage of staff PPE, and extensive cleaning of exam room while observing appropriate contact time as indicated for disinfecting solutions.     HPI Gloria [name] is here for follow up of her chronic medical problems, including htn, hld, hypothyroidism, DM, MG, lumbar radiculopathy    Medications and allergies reviewed with patient and updated if appropriate.  Current Outpatient Medications on File Prior to Visit  Medication Sig Dispense Refill   amLODipine (NORVASC) 5 MG tablet TAKE 1 TABLET(5 MG) BY MOUTH DAILY 90 tablet 1   Ascorbic Acid (VITAMIN C PO) Take 1 tablet by mouth daily at 6 (six) AM.     atorvastatin (LIPITOR) 10 MG tablet TAKE 1 TABLET BY MOUTH DAILY AT 6 PM 90 tablet 0   Biotin 1000 MCG CHEW Chew 2 each by mouth daily.     CALCIUM-MAGNESIUM-ZINC PO Take 1 tablet by mouth daily at 6 (six) AM.     cholecalciferol (VITAMIN D3) 25 MCG (1000 UNIT) tablet Take 1,000 Units by mouth daily.     Cyanocobalamin (VITAMIN B 12 PO) Take 1 tablet by mouth daily at 6 (six) AM.     diclofenac sodium (VOLTAREN) 1 % GEL Apply 4 g topically 4 (four) times daily. (Patient taking differently: Apply 4 g topically 4 (four) times daily as needed.) 100 g 5   Flaxseed, Linseed, (FLAX SEED OIL PO) Take 1,000 mg by mouth daily at 6 (six) AM.     levOCARNitine (CARNITINE PO) Take 1 tablet by mouth daily at 6 (six) AM.     levothyroxine (SYNTHROID) 100 MCG tablet TAKE 1 TABLET BY MOUTH DAILY 90 tablet 0   losartan (COZAAR) 100 MG tablet TAKE 1 TABLET(100 MG) BY MOUTH DAILY 90 tablet 1   meloxicam (MOBIC) 15 MG tablet Take 15 mg by mouth daily.     Misc Natural Products (SAMBUCUS ELDERBERRY IMMUNE PO) Take 2 tablets by mouth daily at 2 am.  (Patient not taking: Reported on 01/05/2022)     Multiple Vitamins-Minerals (WOMENS 50+ MULTI VITAMIN PO) Take 1 tablet by mouth daily at 6 (six) AM.     Omega-3 1000 MG CAPS Take 1 capsule by mouth daily.     pyridostigmine (MESTINON) 60 MG tablet Take 60 mg by mouth 3 (three) times daily as needed. Per Neurologist     ST JOSEPH ASPIRIN PO Take 81 mg by mouth daily.     triamcinolone cream (KENALOG) 0.1 % APPLY TOPICALLY TO THE AFFECTED AREA TWICE DAILY 30 g 0   VITAMIN E PO Take 1 tablet by mouth daily at 6 (six) AM.     No current facility-administered medications on file prior to visit.     Review of Systems     Objective:  There were no vitals filed for this visit. BP Readings from Last 3 Encounters:  01/13/22 116/65  12/20/21 (!) 142/82  09/29/21 126/88   Wt Readings from Last 3 Encounters:  01/13/22 164 lb (74.4 kg)  12/30/21 164 lb (74.4 kg)  12/20/21 165 lb 12.8 oz (75.2 kg)   There is no height or weight on file to calculate BMI.    Physical Exam     Lab Results  Component Value Date   WBC 3.8 (  L) 08/11/2021   HGB 13.3 08/11/2021   HCT 39.0 08/11/2021   PLT 172 08/11/2021   GLUCOSE 118 (H) 08/11/2021   CHOL 148 02/23/2021   TRIG 76.0 02/23/2021   HDL 56.60 02/23/2021   LDLCALC 76 02/23/2021   ALT 18 08/11/2021   AST 28 08/11/2021   NA 141 08/11/2021   K 4.0 08/11/2021   CL 102 08/11/2021   CREATININE 1.10 (H) 08/11/2021   BUN 14 08/11/2021   CO2 30 08/11/2021   TSH 0.41 02/23/2021   HGBA1C 5.6 08/24/2021   HGBA1C 5.6 08/24/2021   HGBA1C 5.6 (A) 08/24/2021   HGBA1C 5.6 08/24/2021   MICROALBUR 2.0 (H) 12/23/2016     Assessment & Plan:    See Problem List for Assessment and Plan of chronic medical problems.

## 2022-02-21 NOTE — Patient Instructions (Addendum)
° ° ° °  Blood work was ordered.   ° ° °Medications changes include :    ° ° °Your prescription(s) have been sent to your pharmacy.  ° ° °A referral was ordered for XX.     Someone from that office will call you to schedule an appointment.  ° ° °Return in about 6 months (around 08/25/2022) for CPE. ° °

## 2022-02-22 ENCOUNTER — Other Ambulatory Visit: Payer: Self-pay

## 2022-02-22 ENCOUNTER — Ambulatory Visit (INDEPENDENT_AMBULATORY_CARE_PROVIDER_SITE_OTHER): Payer: Medicare HMO | Admitting: Internal Medicine

## 2022-02-22 ENCOUNTER — Encounter: Payer: Self-pay | Admitting: Internal Medicine

## 2022-02-22 VITALS — BP 132/72 | HR 58 | Temp 98.4°F | Ht 66.0 in | Wt 163.0 lb

## 2022-02-22 DIAGNOSIS — E7849 Other hyperlipidemia: Secondary | ICD-10-CM | POA: Diagnosis not present

## 2022-02-22 DIAGNOSIS — G7000 Myasthenia gravis without (acute) exacerbation: Secondary | ICD-10-CM | POA: Diagnosis not present

## 2022-02-22 DIAGNOSIS — E039 Hypothyroidism, unspecified: Secondary | ICD-10-CM

## 2022-02-22 DIAGNOSIS — I10 Essential (primary) hypertension: Secondary | ICD-10-CM

## 2022-02-22 DIAGNOSIS — E119 Type 2 diabetes mellitus without complications: Secondary | ICD-10-CM | POA: Diagnosis not present

## 2022-02-22 DIAGNOSIS — I2510 Atherosclerotic heart disease of native coronary artery without angina pectoris: Secondary | ICD-10-CM | POA: Diagnosis not present

## 2022-02-22 LAB — COMPREHENSIVE METABOLIC PANEL
ALT: 16 U/L (ref 0–35)
AST: 25 U/L (ref 0–37)
Albumin: 4.4 g/dL (ref 3.5–5.2)
Alkaline Phosphatase: 62 U/L (ref 39–117)
BUN: 17 mg/dL (ref 6–23)
CO2: 30 mEq/L (ref 19–32)
Calcium: 10.1 mg/dL (ref 8.4–10.5)
Chloride: 101 mEq/L (ref 96–112)
Creatinine, Ser: 1.09 mg/dL (ref 0.40–1.20)
GFR: 51.45 mL/min — ABNORMAL LOW (ref >60.00)
Glucose, Bld: 95 mg/dL (ref 70–99)
Potassium: 3.9 mEq/L (ref 3.5–5.1)
Sodium: 138 mEq/L (ref 135–145)
Total Bilirubin: 0.5 mg/dL (ref 0.2–1.2)
Total Protein: 7.5 g/dL (ref 6.0–8.3)

## 2022-02-22 LAB — LIPID PANEL
Cholesterol: 155 mg/dL (ref 0–200)
HDL: 53.5 mg/dL (ref >39.00)
LDL Cholesterol: 69 mg/dL (ref 0–99)
NonHDL: 101.42
Total CHOL/HDL Ratio: 3
Triglycerides: 163 mg/dL — ABNORMAL HIGH (ref 0.0–149.0)
VLDL: 32.6 mg/dL (ref 0.0–40.0)

## 2022-02-22 LAB — TSH: TSH: 0.18 u[IU]/mL — ABNORMAL LOW (ref 0.35–5.50)

## 2022-02-22 LAB — HEMOGLOBIN A1C: Hgb A1c MFr Bld: 6.1 % (ref 4.6–6.5)

## 2022-02-22 NOTE — Assessment & Plan Note (Signed)
Chronic Blood pressure well controlled CMP Continue amlodipine 5 mg daily, losartan 100 mg daily 

## 2022-02-22 NOTE — Assessment & Plan Note (Signed)
Chronic ?In remission ?Taken of mestinon ?Following with neuro. ?

## 2022-02-22 NOTE — Addendum Note (Signed)
Addended by: Pincus Sanes on: 02/22/2022 12:30 PM ? ? Modules accepted: Orders ? ?

## 2022-02-22 NOTE — Assessment & Plan Note (Signed)
Chronic Regular exercise and healthy diet encouraged Check lipid panel  Continue atorvastatin 10 mg daily 

## 2022-02-22 NOTE — Assessment & Plan Note (Signed)
Chronic ?No symptoms concerning for angina ?Continue aspirin 81 mg daily, atorvastatin 10 mg daily ?Healthy diet, regular exercise ?

## 2022-02-22 NOTE — Assessment & Plan Note (Signed)
Chronic  Clinically euthyroid Currently taking levothyroxine 100 mcg daily Check tsh  Titrate med dose if needed  

## 2022-02-22 NOTE — Assessment & Plan Note (Signed)
Chronic ?Lab Results  ?Component Value Date  ? HGBA1C 5.6 08/24/2021  ? HGBA1C 5.6 08/24/2021  ? HGBA1C 5.6 (A) 08/24/2021  ? HGBA1C 5.6 08/24/2021  ? ?Sugars very well controlled ?Check A1c, urine microalbumin today ?Continue diet control ?Stressed regular exercise, diabetic diet ? ? ?

## 2022-02-23 LAB — MICROALBUMIN / CREATININE URINE RATIO
Creatinine,U: 72.9 mg/dL
Microalb Creat Ratio: 4.1 mg/g (ref 0.0–30.0)
Microalb, Ur: 3 mg/dL — ABNORMAL HIGH (ref 0.0–1.9)

## 2022-02-24 MED ORDER — LEVOTHYROXINE SODIUM 100 MCG PO TABS: ORAL_TABLET | ORAL | 0 refills | Status: DC

## 2022-02-24 NOTE — Addendum Note (Signed)
Addended by: Binnie Rail on: 02/24/2022 09:21 PM ? ? Modules accepted: Orders ? ?

## 2022-02-28 DIAGNOSIS — H43813 Vitreous degeneration, bilateral: Secondary | ICD-10-CM | POA: Diagnosis not present

## 2022-02-28 DIAGNOSIS — H2513 Age-related nuclear cataract, bilateral: Secondary | ICD-10-CM | POA: Diagnosis not present

## 2022-02-28 DIAGNOSIS — H04123 Dry eye syndrome of bilateral lacrimal glands: Secondary | ICD-10-CM | POA: Diagnosis not present

## 2022-02-28 DIAGNOSIS — H2000 Unspecified acute and subacute iridocyclitis: Secondary | ICD-10-CM | POA: Diagnosis not present

## 2022-03-03 DIAGNOSIS — H2000 Unspecified acute and subacute iridocyclitis: Secondary | ICD-10-CM | POA: Diagnosis not present

## 2022-03-15 ENCOUNTER — Other Ambulatory Visit: Payer: Self-pay | Admitting: Internal Medicine

## 2022-03-15 DIAGNOSIS — Z1231 Encounter for screening mammogram for malignant neoplasm of breast: Secondary | ICD-10-CM

## 2022-03-17 ENCOUNTER — Ambulatory Visit (INDEPENDENT_AMBULATORY_CARE_PROVIDER_SITE_OTHER): Payer: Medicare HMO | Admitting: Nurse Practitioner

## 2022-03-17 VITALS — BP 136/78 | HR 83 | Temp 97.9°F | Ht 66.0 in | Wt 166.2 lb

## 2022-03-17 DIAGNOSIS — S0990XA Unspecified injury of head, initial encounter: Secondary | ICD-10-CM

## 2022-03-17 NOTE — Patient Instructions (Signed)
Monitor for the following signs, if they occur call 911: ?Lethargy (fatigue/tired/sleepiness) ?Forgetfulness ?Double vision/blurry vision ?Difficulty talking ?Difficulty understanding ?Weakness in the body or arm/leg ?Numbness/Sensory changes ?Difficulty Swallowing ?Seizures ?Headache that is getting worse not better ? ?Avoid NSAIDs for the nest 3-4 days (ibuprofen, advil, motrin, goody powder, aspirin) ? ?

## 2022-03-17 NOTE — Progress Notes (Signed)
? ? ? ?Subjective:  ?Patient ID: Gloria [name], female    DOB: 1951/07/15  Age: 71 y.o. MRN: [id number] ? ?CC:  ?Chief Complaint  ?Patient presents with  ? Headache  ?  Hit her head on a pole 03/15/2022  ?  ? ? ?HPI  ?This patient arrives today for the above. ? ?She was sitting in a chair, leaned forward to grab a drink that was on the ground, and hit the left lateral aspect of the frontal portion of her skull.  This occurred approximately 48 hours ago.  Since then she has been experiencing a moderate headache, with improvement and Tylenol.  The area to her skull is slightly tender to touch.  She did not want to come to the doctor as she felt her symptoms were mild, however her family want her to be evaluated for possible concussion.  She denies any loss of consciousness, nausea, vomiting, seizure, memory, lethargy, fatigue, visual changes, weakness, difficulty with speech, difficulty with swallowing, or sensory changes. ? ?Past Medical History:  ?Diagnosis Date  ? Alopecia areata 10/2010 onset  ? Diabetes mellitus, type 2 (HCC)   ? on meds  ? Dyslipidemia   ? Hyperlipidemia   ? on meds  ? Hypertension   ? on meds  ? Hypothyroid   ? on meds  ? Myasthenia gravis   ? ? ? ? ?Family History  ?Problem Relation Age of Onset  ? Colon cancer Neg Hx   ? Colon polyps Neg Hx   ? Esophageal cancer Neg Hx   ? Rectal cancer Neg Hx   ? Stomach cancer Neg Hx   ? ? ?Social History  ? ?Social History Narrative  ? Not on file  ? ?Social History  ? ?Tobacco Use  ? Smoking status: Never  ? Smokeless tobacco: Never  ?Substance Use Topics  ? Alcohol use: No  ? ? ? ?Current Meds  ?Medication Sig  ? amLODipine (NORVASC) 5 MG tablet TAKE 1 TABLET(5 MG) BY MOUTH DAILY  ? Ascorbic Acid (VITAMIN C PO) Take 1 tablet by mouth daily at 6 (six) AM.  ? atorvastatin (LIPITOR) 10 MG tablet TAKE 1 TABLET BY MOUTH DAILY AT 6 PM  ? Biotin 1000 MCG CHEW Chew 2 each by mouth daily.  ? CALCIUM-MAGNESIUM-ZINC PO Take 1 tablet by mouth daily at 6 (six)  AM.  ? cholecalciferol (VITAMIN D3) 25 MCG (1000 UNIT) tablet Take 1,000 Units by mouth daily.  ? Cyanocobalamin (VITAMIN B 12 PO) Take 1 tablet by mouth daily at 6 (six) AM.  ? diclofenac sodium (VOLTAREN) 1 % GEL Apply 4 g topically 4 (four) times daily. (Patient taking differently: Apply 4 g topically 4 (four) times daily as needed.)  ? Flaxseed Oil (LINSEED OIL) OIL Take by mouth.  ? Flaxseed, Linseed, (FLAX SEED OIL PO) Take 1,000 mg by mouth daily at 6 (six) AM.  ? levOCARNitine (CARNITINE PO) Take 1 tablet by mouth daily at 6 (six) AM.  ? levothyroxine (SYNTHROID) 100 MCG tablet Take 1 tab daily 30 minutes prior to breakfast 6 days a week and 1/2 tab one day a week  ? losartan (COZAAR) 100 MG tablet TAKE 1 TABLET(100 MG) BY MOUTH DAILY  ? meloxicam (MOBIC) 15 MG tablet Take 15 mg by mouth daily.  ? Multiple Vitamins-Minerals (WOMENS 50+ MULTI VITAMIN PO) Take 1 tablet by mouth daily at 6 (six) AM.  ? Omega-3 1000 MG CAPS Take 1 capsule by mouth daily.  ? ST JOSEPH ASPIRIN  PO Take 81 mg by mouth daily.  ? triamcinolone cream (KENALOG) 0.1 % APPLY TOPICALLY TO THE AFFECTED AREA TWICE DAILY  ? VITAMIN E PO Take 1 tablet by mouth daily at 6 (six) AM.  ? [DISCONTINUED] carboxymethylcellulose (REFRESH PLUS) 0.5 % SOLN Place 1 drop into both eyes daily as needed.  ? ? ?ROS:  ?Review of Systems  ?Eyes:  Negative for blurred vision and double vision.  ?Gastrointestinal:  Negative for nausea and vomiting.  ?Neurological:  Positive for headaches. Negative for sensory change, speech change, loss of consciousness and weakness.  ?     (-) memory issues/(-) seizures  ? ? ?Objective:  ? ?Today's Vitals: BP 136/78   Pulse 83   Temp 97.9 ?F (36.6 ?C) (Oral)   Ht 5\' 6"  (1.676 m)   Wt 166 lb 4 oz (75.4 kg)   SpO2 94%   BMI 26.83 kg/m?  ? ?  03/17/2022  ?  2:50 PM 02/22/2022  ? 11:45 AM 01/13/2022  ?  9:37 AM  ?Vitals with BMI  ?Height 5\' 6"  5\' 6"    ?Weight 166 lbs 4 oz 163 lbs   ?BMI 26.85 26.32   ?Systolic 136 132 03/13/2022   ?Diastolic 78 72 65  ?Pulse 83 58 50  ?  ? ?Physical Exam ?Vitals reviewed.  ?Constitutional:   ?   General: She is not in acute distress. ?   Appearance: Normal appearance.  ?HENT:  ?   Head: Normocephalic and atraumatic.  ?Eyes:  ?   Extraocular Movements: Extraocular movements intact.  ?   Pupils: Pupils are equal, round, and reactive to light.  ?Neck:  ?   Vascular: No carotid bruit.  ?Cardiovascular:  ?   Rate and Rhythm: Normal rate and regular rhythm.  ?   Pulses: Normal pulses.  ?   Heart sounds: Normal heart sounds.  ?Pulmonary:  ?   Effort: Pulmonary effort is normal.  ?   Breath sounds: Normal breath sounds.  ?Skin: ?   General: Skin is warm and dry.  ?Neurological:  ?   General: No focal deficit present.  ?   Mental Status: She is alert and oriented to person, place, and time. Mental status is at baseline.  ?   Cranial Nerves: Cranial nerves 2-12 are intact.  ?   Sensory: Sensation is intact.  ?   Motor: Motor function is intact.  ?   Coordination: Coordination is intact.  ?   Gait: Gait is intact.  ?Psychiatric:     ?   Mood and Affect: Mood normal.     ?   Behavior: Behavior normal.     ?   Judgment: Judgment normal.  ? ? ? ? ? ? ? ?Assessment and Plan  ? ?1. Acute head injury, initial encounter   ? ? ? ?Plan: ?1.  Patient's neurologic exam completely intact, no significant neurologic symptoms noted on review of systems. Low likelihood of significant brain bleed at this time.  I recommend patient monitor herself for signs of worsening neurologic symptoms and I have provided her with a list of the symptoms.  If they were to occur she was told to call 911.  Specifically she was told to watch out for lethargy, forgetfulness, double vision, blurry vision, difficulty talking, difficulty comprehending, weakness, numbness or sensory changes, difficulty swallowing, seizures, or headache that is worsening as opposed to improving.  She was also told to avoid NSAIDs for the next 3 to 4 days and to take Tylenol  for pain relief as needed.  She was also told to not participate in any moderate or intense physical activity until her headache has completely subsided.  She is agreeable with this plan.  She was also encouraged to follow-up in the emergency department in the future if she were to ever have another head injury to rule out bleeding on the brain. ? ? ?Tests ordered ?No orders of the defined types were placed in this encounter. ? ? ? ? ?No orders of the defined types were placed in this encounter. ? ? ?Patient to follow-up if symptoms worsen or fail to improve. ? ?Elenore Paddy, NP ? ?

## 2022-03-29 ENCOUNTER — Ambulatory Visit
Admission: RE | Admit: 2022-03-29 | Discharge: 2022-03-29 | Disposition: A | Discharge status: Home / Self Care | Payer: Medicare HMO | Source: Ambulatory Visit | Attending: Internal Medicine | Admitting: Internal Medicine

## 2022-03-29 DIAGNOSIS — Z1231 Encounter for screening mammogram for malignant neoplasm of breast: Secondary | ICD-10-CM | POA: Diagnosis not present

## 2022-03-30 ENCOUNTER — Other Ambulatory Visit: Payer: Self-pay | Admitting: Internal Medicine

## 2022-04-04 DIAGNOSIS — H2000 Unspecified acute and subacute iridocyclitis: Secondary | ICD-10-CM | POA: Diagnosis not present

## 2022-04-07 ENCOUNTER — Other Ambulatory Visit (INDEPENDENT_AMBULATORY_CARE_PROVIDER_SITE_OTHER): Payer: Medicare HMO

## 2022-04-07 DIAGNOSIS — E039 Hypothyroidism, unspecified: Secondary | ICD-10-CM

## 2022-04-07 LAB — TSH: TSH: 0.94 u[IU]/mL (ref 0.35–5.50)

## 2022-04-08 ENCOUNTER — Other Ambulatory Visit: Payer: Self-pay | Admitting: Internal Medicine

## 2022-04-11 ENCOUNTER — Telehealth: Payer: Self-pay | Admitting: Internal Medicine

## 2022-04-11 NOTE — Telephone Encounter (Signed)
Patient states that she is returning your call and that she will only be home for about an hour,.  Please call ?

## 2022-04-11 NOTE — Telephone Encounter (Signed)
Spoke with patient today and lab results given. 

## 2022-04-14 ENCOUNTER — Encounter: Payer: Self-pay | Admitting: Internal Medicine

## 2022-04-14 NOTE — Patient Instructions (Addendum)
? ?  ?  I am not concerned about your continued head pain - you have no symptoms concerning for brain bleed. ? ? ? ?Return for follow up as scheduled. ? ?

## 2022-04-14 NOTE — Progress Notes (Signed)
? ? ? ? ?Subjective:  ? ? Patient ID: Gloria [name], female    DOB: 1950-12-20, 71 y.o.   MRN: [id number] ? ?This visit occurred during the SARS-CoV-2 public health emergency.  Safety protocols were in place, including screening questions prior to the visit, additional usage of staff PPE, and extensive cleaning of exam room while observing appropriate contact time as indicated for disinfecting solutions.   ? ? ?HPI ?Gloria [name] is here for follow up of her recent head trauma. ? ?She hit her head and was seen 4/6.  No headaches - just a twinge occasionally.  She was concerned about the possibility of intracranial bleeding.  She denies any lightheadedness, dizziness, changes in vision or nausea.  Her head is still tender where she hit although it is better.  She was just concerned and wanted to be checked out again. ? ?She is taking her medications as prescribed. ? ?Medications and allergies reviewed with patient and updated if appropriate. ? ?Current Outpatient Medications on File Prior to Visit  ?Medication Sig Dispense Refill  ? amLODipine (NORVASC) 5 MG tablet TAKE 1 TABLET(5 MG) BY MOUTH DAILY 90 tablet 1  ? Ascorbic Acid (VITAMIN C PO) Take 1 tablet by mouth daily at 6 (six) AM.    ? atorvastatin (LIPITOR) 10 MG tablet TAKE 1 TABLET BY MOUTH DAILY AT 6 PM 90 tablet 0  ? Biotin 1000 MCG CHEW Chew 2 each by mouth daily.    ? CALCIUM-MAGNESIUM-ZINC PO Take 1 tablet by mouth daily at 6 (six) AM.    ? cholecalciferol (VITAMIN D3) 25 MCG (1000 UNIT) tablet Take 1,000 Units by mouth daily.    ? Cyanocobalamin (VITAMIN B 12 PO) Take 1 tablet by mouth daily at 6 (six) AM.    ? diclofenac sodium (VOLTAREN) 1 % GEL Apply 4 g topically 4 (four) times daily. (Patient taking differently: Apply 4 g topically 4 (four) times daily as needed.) 100 g 5  ? Flaxseed Oil (LINSEED OIL) OIL Take by mouth.    ? Flaxseed, Linseed, (FLAX SEED OIL PO) Take 1,000 mg by mouth daily at 6 (six) AM.    ? levOCARNitine (CARNITINE PO) Take 1 tablet  by mouth daily at 6 (six) AM.    ? levothyroxine (SYNTHROID) 100 MCG tablet Take 1 tab daily 30 minutes prior to breakfast 6 days a week and 1/2 tab one day a week 90 tablet 0  ? losartan (COZAAR) 100 MG tablet TAKE 1 TABLET(100 MG) BY MOUTH DAILY 90 tablet 1  ? meloxicam (MOBIC) 15 MG tablet TAKE 1 TABLET(15 MG) BY MOUTH DAILY 90 tablet 0  ? Multiple Vitamins-Minerals (WOMENS 50+ MULTI VITAMIN PO) Take 1 tablet by mouth daily at 6 (six) AM.    ? Omega-3 1000 MG CAPS Take 1 capsule by mouth daily.    ? ST JOSEPH ASPIRIN PO Take 81 mg by mouth daily.    ? triamcinolone cream (KENALOG) 0.1 % APPLY TOPICALLY TO THE AFFECTED AREA TWICE DAILY 30 g 0  ? VITAMIN E PO Take 1 tablet by mouth daily at 6 (six) AM.    ? ?No current facility-administered medications on file prior to visit.  ? ? ? ?Review of Systems  ?Eyes:  Negative for visual disturbance.  ?Gastrointestinal:  Negative for nausea.  ?Musculoskeletal:  Positive for neck pain.  ?Neurological:  Negative for dizziness, light-headedness and headaches.  ? ?   ?Objective:  ? ?Vitals:  ? 04/15/22 1430  ?BP: 128/78  ?Pulse: 80  ?  Temp: 98.4 ?F (36.9 ?C)  ?SpO2: 98%  ? ?BP Readings from Last 3 Encounters:  ?04/15/22 128/78  ?03/17/22 136/78  ?02/22/22 132/72  ? ?Wt Readings from Last 3 Encounters:  ?04/15/22 162 lb (73.5 kg)  ?03/17/22 166 lb 4 oz (75.4 kg)  ?02/22/22 163 lb (73.9 kg)  ? ?Body mass index is 26.15 kg/m?. ? ?  ?Physical Exam ?Constitutional:   ?   General: She is not in acute distress. ?   Appearance: Normal appearance.  ?HENT:  ?   Head: Normocephalic.  ?Skin: ?   General: Skin is warm and dry.  ?Neurological:  ?   General: No focal deficit present.  ?   Mental Status: She is alert and oriented to person, place, and time.  ?   Cranial Nerves: No cranial nerve deficit.  ?   Sensory: No sensory deficit.  ?   Motor: No weakness.  ?Psychiatric:     ?   Mood and Affect: Mood normal.     ?   Behavior: Behavior normal.     ?   Thought Content: Thought content  normal.     ?   Judgment: Judgment normal.  ? ?   ? ?Lab Results  ?Component Value Date  ? WBC 3.8 (L) 08/11/2021  ? HGB 13.3 08/11/2021  ? HCT 39.0 08/11/2021  ? PLT 172 08/11/2021  ? GLUCOSE 95 02/22/2022  ? CHOL 155 02/22/2022  ? TRIG 163.0 (H) 02/22/2022  ? HDL 53.50 02/22/2022  ? [place] 69 02/22/2022  ? ALT 16 02/22/2022  ? AST 25 02/22/2022  ? NA 138 02/22/2022  ? K 3.9 02/22/2022  ? CL 101 02/22/2022  ? CREATININE 1.09 02/22/2022  ? BUN 17 02/22/2022  ? CO2 30 02/22/2022  ? TSH 0.94 04/07/2022  ? HGBA1C 6.1 02/22/2022  ? MICROALBUR 3.0 (H) 02/22/2022  ? ? ? ?Assessment & Plan:  ? ? ?See Problem List for Assessment and Plan of chronic medical problems.  ? ? ?

## 2022-04-15 ENCOUNTER — Ambulatory Visit: Payer: Medicare HMO | Admitting: Internal Medicine

## 2022-04-15 ENCOUNTER — Ambulatory Visit (INDEPENDENT_AMBULATORY_CARE_PROVIDER_SITE_OTHER): Payer: Medicare HMO | Admitting: Internal Medicine

## 2022-04-15 VITALS — BP 128/78 | HR 80 | Temp 98.4°F | Ht 66.0 in | Wt 162.0 lb

## 2022-04-15 DIAGNOSIS — E039 Hypothyroidism, unspecified: Secondary | ICD-10-CM

## 2022-04-15 DIAGNOSIS — G7000 Myasthenia gravis without (acute) exacerbation: Secondary | ICD-10-CM

## 2022-04-15 DIAGNOSIS — E7849 Other hyperlipidemia: Secondary | ICD-10-CM

## 2022-04-15 DIAGNOSIS — S0990XA Unspecified injury of head, initial encounter: Secondary | ICD-10-CM | POA: Insufficient documentation

## 2022-04-15 DIAGNOSIS — I10 Essential (primary) hypertension: Secondary | ICD-10-CM | POA: Diagnosis not present

## 2022-04-15 DIAGNOSIS — S0990XD Unspecified injury of head, subsequent encounter: Secondary | ICD-10-CM

## 2022-04-15 DIAGNOSIS — E119 Type 2 diabetes mellitus without complications: Secondary | ICD-10-CM

## 2022-04-15 DIAGNOSIS — M5416 Radiculopathy, lumbar region: Secondary | ICD-10-CM

## 2022-04-15 NOTE — Assessment & Plan Note (Addendum)
Chronic  ?Clinically euthyroid ?TSH in normal range ?Currently taking levothyroxine 100 mcg daily 6 days a week and 50 mcg 1 day a week-continue ? ?Lab Results  ?Component Value Date  ? TSH 0.94 04/07/2022  ? ? ?

## 2022-04-15 NOTE — Assessment & Plan Note (Signed)
Chronic ?Lab Results  ?Component Value Date  ? HGBA1C 6.1 02/22/2022  ? ?Sugars well controlled ?Continue lifestyle control ?Stressed regular exercise, diabetic diet ? ? ?

## 2022-04-15 NOTE — Assessment & Plan Note (Addendum)
Chronic ?Blood pressure well controlled ?Continue amlodipine 5 mg daily, losartan 100 mg daily ?

## 2022-04-15 NOTE — Assessment & Plan Note (Signed)
Chronic ?Regular exercise and healthy diet encouraged ?LDL very good at 69 ?Continue atorvastatin 10 mg daily ?

## 2022-04-15 NOTE — Assessment & Plan Note (Addendum)
subactue ?Hit head 4/4 and was evaluated here 4/6 without concerning symptoms ?No imaging was done ?Today still having pain where she hit her head-tender to palpation ?No concerning symptoms to suggest brain bleed or any other consequences from head trauma-no dizziness, lightheadedness, changes in vision, nausea or confusion ?No need for imaging at this time ?Reassured that the area where she hit herself will be tender for a while, but it is improving and will continue to improve ?She will let me know if there is any other concerns ?

## 2022-05-18 DIAGNOSIS — M47816 Spondylosis without myelopathy or radiculopathy, lumbar region: Secondary | ICD-10-CM | POA: Diagnosis not present

## 2022-06-01 ENCOUNTER — Other Ambulatory Visit: Payer: Self-pay | Admitting: Internal Medicine

## 2022-06-08 DIAGNOSIS — M47816 Spondylosis without myelopathy or radiculopathy, lumbar region: Secondary | ICD-10-CM | POA: Diagnosis not present

## 2022-07-08 DIAGNOSIS — Z6826 Body mass index (BMI) 26.0-26.9, adult: Secondary | ICD-10-CM | POA: Diagnosis not present

## 2022-07-08 DIAGNOSIS — M4316 Spondylolisthesis, lumbar region: Secondary | ICD-10-CM | POA: Diagnosis not present

## 2022-08-03 ENCOUNTER — Other Ambulatory Visit: Payer: Self-pay

## 2022-08-03 ENCOUNTER — Telehealth: Payer: Self-pay | Admitting: Internal Medicine

## 2022-08-03 MED ORDER — LEVOTHYROXINE SODIUM 100 MCG PO TABS: ORAL_TABLET | ORAL | 0 refills | Status: DC

## 2022-08-03 NOTE — Telephone Encounter (Signed)
Caller & Relationship to patient: Gloria [name]  Call back number: [phone]  Date of last office visit: 04/15/22  Date of next office visit: 08/26/22  Medication(s) to be refilled:  levothyroxine (SYNTHROID) 100 MCG tablet  Preferred Pharmacy:  [id number] [place], [place] - [address] AT [facility] OF [facility] [address] Phone:  [phone]  Fax:  [phone]

## 2022-08-03 NOTE — Telephone Encounter (Signed)
Sent in today 

## 2022-08-26 ENCOUNTER — Encounter: Payer: Medicare HMO | Admitting: Internal Medicine

## 2022-09-06 NOTE — Patient Instructions (Addendum)
Blood work was ordered.     Medications changes include :   none   Your prescription(s) have been sent to your pharmacy.     Return in about 6 months (around 03/08/2023) for follow up, Schedule DEXA-[place].    Health Maintenance, Female Adopting a healthy lifestyle and getting preventive care are important in promoting health and wellness. Ask your health care provider about: The right schedule for you to have regular tests and exams. Things you can do on your own to prevent diseases and keep yourself healthy. What should I know about diet, weight, and exercise? Eat a healthy diet  Eat a diet that includes plenty of vegetables, fruits, low-fat dairy products, and lean protein. Do not eat a lot of foods that are high in solid fats, added sugars, or sodium. Maintain a healthy weight Body mass index (BMI) is used to identify weight problems. It estimates body fat based on height and weight. Your health care provider can help determine your BMI and help you achieve or maintain a healthy weight. Get regular exercise Get regular exercise. This is one of the most important things you can do for your health. Most adults should: Exercise for at least 150 minutes each week. The exercise should increase your heart rate and make you sweat (moderate-intensity exercise). Do strengthening exercises at least twice a week. This is in addition to the moderate-intensity exercise. Spend less time sitting. Even light physical activity can be beneficial. Watch cholesterol and blood lipids Have your blood tested for lipids and cholesterol at 71 years of age, then have this test every 5 years. Have your cholesterol levels checked more often if: Your lipid or cholesterol levels are high. You are older than 71 years of age. You are at high risk for heart disease. What should I know about cancer screening? Depending on your health history and family history, you may need to have cancer screening at  various ages. This may include screening for: Breast cancer. Cervical cancer. Colorectal cancer. Skin cancer. Lung cancer. What should I know about heart disease, diabetes, and high blood pressure? Blood pressure and heart disease High blood pressure causes heart disease and increases the risk of stroke. This is more likely to develop in people who have high blood pressure readings or are overweight. Have your blood pressure checked: Every 3-5 years if you are 62-66 years of age. Every year if you are 5 years old or older. Diabetes Have regular diabetes screenings. This checks your fasting blood sugar level. Have the screening done: Once every three years after age 85 if you are at a normal weight and have a low risk for diabetes. More often and at a younger age if you are overweight or have a high risk for diabetes. What should I know about preventing infection? Hepatitis B If you have a higher risk for hepatitis B, you should be screened for this virus. Talk with your health care provider to find out if you are at risk for hepatitis B infection. Hepatitis C Testing is recommended for: Everyone born from [birth year] through 1965. Anyone with known risk factors for hepatitis C. Sexually transmitted infections (STIs) Get screened for STIs, including gonorrhea and chlamydia, if: You are sexually active and are younger than 71 years of age. You are older than 71 years of age and your health care provider tells you that you are at risk for this type of infection. Your sexual activity has changed since you were last screened,  and you are at increased risk for chlamydia or gonorrhea. Ask your health care provider if you are at risk. Ask your health care provider about whether you are at high risk for HIV. Your health care provider may recommend a prescription medicine to help prevent HIV infection. If you choose to take medicine to prevent HIV, you should first get tested for HIV. You should then be  tested every 3 months for as long as you are taking the medicine. Pregnancy If you are about to stop having your period (premenopausal) and you may become pregnant, seek counseling before you get pregnant. Take 400 to 800 micrograms (mcg) of folic acid every day if you become pregnant. Ask for birth control (contraception) if you want to prevent pregnancy. Osteoporosis and menopause Osteoporosis is a disease in which the bones lose minerals and strength with aging. This can result in bone fractures. If you are 37 years old or older, or if you are at risk for osteoporosis and fractures, ask your health care provider if you should: Be screened for bone loss. Take a calcium or vitamin D supplement to lower your risk of fractures. Be given hormone replacement therapy (HRT) to treat symptoms of menopause. Follow these instructions at home: Alcohol use Do not drink alcohol if: Your health care provider tells you not to drink. You are pregnant, may be pregnant, or are planning to become pregnant. If you drink alcohol: Limit how much you have to: 0-1 drink a day. Know how much alcohol is in your drink. In the [place] one drink equals one 12 oz bottle of beer (355 mL), one 5 oz glass of wine (148 mL), or one 1 oz glass of hard liquor (44 mL). Lifestyle Do not use any products that contain nicotine or tobacco. These products include cigarettes, chewing tobacco, and vaping devices, such as e-cigarettes. If you need help quitting, ask your health care provider. Do not use street drugs. Do not share needles. Ask your health care provider for help if you need support or information about quitting drugs. General instructions Schedule regular health, dental, and eye exams. Stay current with your vaccines. Tell your health care provider if: You often feel depressed. You have ever been abused or do not feel safe at home. Summary Adopting a healthy lifestyle and getting preventive care are important in  promoting health and wellness. Follow your health care provider's instructions about healthy diet, exercising, and getting tested or screened for diseases. Follow your health care provider's instructions on monitoring your cholesterol and blood pressure. This information is not intended to replace advice given to you by your health care provider. Make sure you discuss any questions you have with your health care provider. Document Revised: 04/19/2021 Document Reviewed: 04/19/2021 Elsevier Patient Education  2023 [employer].

## 2022-09-06 NOTE — Progress Notes (Unsigned)
Subjective:    Patient ID: Gloria [name], female    DOB: 1951-07-26, 71 y.o.   MRN: [id number]      HPI Shakeelah is here for a Physical exam.    She is doing well.  She is losing her hair - getting thin on the top.  She saw Dr Denna Haggard in the past and the clobetasol scalp solution did help-she wondered about getting refill of that.  She wonders if it is related to her medication.     Medications and allergies reviewed with patient and updated if appropriate.  Current Outpatient Medications on File Prior to Visit  Medication Sig Dispense Refill   amLODipine (NORVASC) 5 MG tablet TAKE 1 TABLET(5 MG) BY MOUTH DAILY 90 tablet 1   Ascorbic Acid (VITAMIN C PO) Take 1 tablet by mouth daily at 6 (six) AM.     atorvastatin (LIPITOR) 10 MG tablet TAKE 1 TABLET BY MOUTH DAILY AT 6 PM 90 tablet 0   CALCIUM-MAGNESIUM-ZINC PO Take 1 tablet by mouth daily at 6 (six) AM.     cholecalciferol (VITAMIN D3) 25 MCG (1000 UNIT) tablet Take 1,000 Units by mouth daily.     Cyanocobalamin (VITAMIN B 12 PO) Take 1 tablet by mouth daily at 6 (six) AM.     diclofenac sodium (VOLTAREN) 1 % GEL Apply 4 g topically 4 (four) times daily. (Patient taking differently: Apply 4 g topically 4 (four) times daily as needed.) 100 g 5   Flaxseed Oil (LINSEED OIL) OIL Take by mouth.     Flaxseed, Linseed, (FLAX SEED OIL PO) Take 1,000 mg by mouth daily at 6 (six) AM.     levOCARNitine (CARNITINE PO) Take 1 tablet by mouth daily at 6 (six) AM.     levothyroxine (SYNTHROID) 100 MCG tablet Take 1 tab daily 30 minutes prior to breakfast 6 days a week and 1/2 tab one day a week 90 tablet 0   losartan (COZAAR) 100 MG tablet TAKE 1 TABLET(100 MG) BY MOUTH DAILY 90 tablet 1   meloxicam (MOBIC) 15 MG tablet TAKE 1 TABLET(15 MG) BY MOUTH DAILY 90 tablet 0   Multiple Vitamins-Minerals (WOMENS 50+ MULTI VITAMIN PO) Take 1 tablet by mouth daily at 6 (six) AM.     Omega-3 1000 MG CAPS Take 1 capsule by mouth daily.     ST JOSEPH  ASPIRIN PO Take 81 mg by mouth daily.     triamcinolone cream (KENALOG) 0.1 % APPLY TOPICALLY TO THE AFFECTED AREA TWICE DAILY 30 g 0   VITAMIN E PO Take 1 tablet by mouth daily at 6 (six) AM.     No current facility-administered medications on file prior to visit.    Review of Systems  Constitutional:  Negative for fever.  Eyes:  Negative for visual disturbance.  Respiratory:  Negative for cough, shortness of breath and wheezing.   Cardiovascular:  Negative for chest pain, palpitations and leg swelling.  Gastrointestinal:  Negative for abdominal pain, blood in stool, constipation and diarrhea.       Occ gerd  Genitourinary:  Negative for dysuria.  Musculoskeletal:  Positive for back pain. Negative for arthralgias.  Skin:  Negative for rash.  Neurological:  Negative for light-headedness and headaches.  Psychiatric/Behavioral:  Negative for dysphoric mood. The patient is not nervous/anxious.        Objective:   Vitals:   09/07/22 1534  BP: 122/74  Pulse: 60  Temp: 98.7 F (37.1 C)  SpO2: 97%  Filed Weights   09/07/22 1534  Weight: 164 lb (74.4 kg)   Body mass index is 26.47 kg/m.  BP Readings from Last 3 Encounters:  09/07/22 122/74  04/15/22 128/78  03/17/22 136/78    Wt Readings from Last 3 Encounters:  09/07/22 164 lb (74.4 kg)  04/15/22 162 lb (73.5 kg)  03/17/22 166 lb 4 oz (75.4 kg)       Physical Exam Constitutional: She appears well-developed and well-nourished. No distress.  HENT:  Head: Normocephalic and atraumatic.  Right Ear: External ear normal. Normal ear canal and TM Left Ear: External ear normal.  Normal ear canal and TM Mouth/Throat: Oropharynx is clear and moist.  Eyes: Conjunctivae normal.  Neck: Neck supple. No tracheal deviation present. No thyromegaly present.  No carotid bruit  Cardiovascular: Normal rate, regular rhythm and normal heart sounds.   No murmur heard.  No edema. Pulmonary/Chest: Effort normal and breath sounds  normal. No respiratory distress. She has no wheezes. She has no rales.  Breast: deferred   Abdominal: Soft. She exhibits no distension. There is no tenderness.  Lymphadenopathy: She has no cervical adenopathy.  Skin: Skin is warm and dry. She is not diaphoretic.  Psychiatric: She has a normal mood and affect. Her behavior is normal.     Lab Results  Component Value Date   WBC 3.8 (L) 08/11/2021   HGB 13.3 08/11/2021   HCT 39.0 08/11/2021   PLT 172 08/11/2021   GLUCOSE 95 02/22/2022   CHOL 155 02/22/2022   TRIG 163.0 (H) 02/22/2022   HDL 53.50 02/22/2022   LDLCALC 69 02/22/2022   ALT 16 02/22/2022   AST 25 02/22/2022   NA 138 02/22/2022   K 3.9 02/22/2022   CL 101 02/22/2022   CREATININE 1.09 02/22/2022   BUN 17 02/22/2022   CO2 30 02/22/2022   TSH 0.94 04/07/2022   HGBA1C 6.1 02/22/2022   MICROALBUR 3.0 (H) 02/22/2022         Assessment & Plan:   Physical exam: Screening blood work  ordered Exercise  regular Weight  good for age Substance abuse  none   Reviewed recommended immunizations.   Health Maintenance  Topic Date Due   FOOT EXAM  01/24/2019   TETANUS/TDAP  12/13/2019   COVID-19 Vaccine (3 - Pfizer risk series) 03/16/2020   Pneumonia Vaccine 17+ Years old (3 - PPSV23 or PCV20) 01/05/2021   Zoster Vaccines- Shingrix (2 of 2) 11/17/2021   DEXA SCAN  02/01/2022   OPHTHALMOLOGY EXAM  07/08/2022   INFLUENZA VACCINE  07/12/2022   HEMOGLOBIN A1C  08/25/2022   Diabetic kidney evaluation - GFR measurement  02/23/2023   Diabetic kidney evaluation - Urine ACR  02/23/2023   MAMMOGRAM  03/30/2023   COLONOSCOPY (Pts 45-7yrs Insurance coverage will need to be confirmed)  01/14/2032   Hepatitis C Screening  Completed   HPV VACCINES  Aged Out          See Problem List for Assessment and Plan of chronic medical problems.

## 2022-09-07 ENCOUNTER — Ambulatory Visit (INDEPENDENT_AMBULATORY_CARE_PROVIDER_SITE_OTHER): Payer: Medicare HMO | Admitting: Internal Medicine

## 2022-09-07 ENCOUNTER — Encounter: Payer: Self-pay | Admitting: Internal Medicine

## 2022-09-07 VITALS — BP 122/74 | HR 60 | Temp 98.7°F | Ht 66.0 in | Wt 164.0 lb

## 2022-09-07 DIAGNOSIS — M545 Low back pain, unspecified: Secondary | ICD-10-CM | POA: Diagnosis not present

## 2022-09-07 DIAGNOSIS — E119 Type 2 diabetes mellitus without complications: Secondary | ICD-10-CM

## 2022-09-07 DIAGNOSIS — I2510 Atherosclerotic heart disease of native coronary artery without angina pectoris: Secondary | ICD-10-CM

## 2022-09-07 DIAGNOSIS — E7849 Other hyperlipidemia: Secondary | ICD-10-CM

## 2022-09-07 DIAGNOSIS — Z13820 Encounter for screening for osteoporosis: Secondary | ICD-10-CM

## 2022-09-07 DIAGNOSIS — L659 Nonscarring hair loss, unspecified: Secondary | ICD-10-CM | POA: Diagnosis not present

## 2022-09-07 DIAGNOSIS — Z0000 Encounter for general adult medical examination without abnormal findings: Secondary | ICD-10-CM | POA: Diagnosis not present

## 2022-09-07 DIAGNOSIS — I10 Essential (primary) hypertension: Secondary | ICD-10-CM | POA: Diagnosis not present

## 2022-09-07 DIAGNOSIS — E039 Hypothyroidism, unspecified: Secondary | ICD-10-CM

## 2022-09-07 MED ORDER — TRIAMCINOLONE ACETONIDE 0.1 % EX CREA: TOPICAL_CREAM | CUTANEOUS | 0 refills | Status: DC

## 2022-09-07 MED ORDER — LOSARTAN POTASSIUM 100 MG PO TABS: ORAL_TABLET | ORAL | 2 refills | Status: DC

## 2022-09-07 MED ORDER — LEVOTHYROXINE SODIUM 100 MCG PO TABS: ORAL_TABLET | ORAL | 0 refills | Status: DC

## 2022-09-07 MED ORDER — ATORVASTATIN CALCIUM 10 MG PO TABS: ORAL_TABLET | ORAL | 2 refills | Status: DC

## 2022-09-07 MED ORDER — AMLODIPINE BESYLATE 5 MG PO TABS: ORAL_TABLET | ORAL | 2 refills | Status: DC

## 2022-09-07 MED ORDER — CLOBETASOL PROPIONATE 0.05 % EX SOLN: CUTANEOUS | 2 refills | Status: DC

## 2022-09-07 NOTE — Assessment & Plan Note (Signed)
Chronic Has been taking meloxicam 15 mg daily-stressed need to take this as needed.  She is also taking Aleve on top of this - stressed not to take at all-discussed risks of medication, especially with her decreased kidney function Refer back to Dr. Thurmon Fair benefit from an injection Dr. Lucia Gaskins Discussed we need to look into other options to treat her pain and avoid NSAIDs

## 2022-09-07 NOTE — Assessment & Plan Note (Signed)
Chronic BP well controlled Continue amlodipine 5 mg daily, losartan 100 mg daily  cmp  

## 2022-09-07 NOTE — Assessment & Plan Note (Addendum)
Chronic Hair loss is worse recently on top of her head-?  Alopecia ?  Related to medication or not Was seeing Dr Denna Haggard May need to see dermatology again I will renew her clobetasol scalp solution to start using

## 2022-09-07 NOTE — Assessment & Plan Note (Signed)
Chronic  Clinically euthyroid Check tsh and will titrate med dose if needed Currently taking levothyroxine 100 mcg daily 6 days a week, 50 mcg daily one day a week  

## 2022-09-07 NOTE — Assessment & Plan Note (Addendum)
Chronic No symptoms c/w angina Continue atorvastatin 10 mg daily, ASA 81 mg daily

## 2022-09-07 NOTE — Assessment & Plan Note (Signed)
Chronic Check lipid panel  Continue atorvastatin 10 mg daily Regular exercise and healthy diet encouraged  

## 2022-09-07 NOTE — Assessment & Plan Note (Signed)
Chronic  Lab Results  Component Value Date   HGBA1C 6.1 02/22/2022   Sugars well controlled Check A1c Continue lifestyle controlled Stressed regular exercise, diabetic diet

## 2022-09-08 LAB — CBC WITH DIFFERENTIAL/PLATELET
Basophils Absolute: 0.1 10*3/uL (ref 0.0–0.1)
Basophils Relative: 1.2 % (ref 0.0–3.0)
Eosinophils Absolute: 0.5 10*3/uL (ref 0.0–0.7)
Eosinophils Relative: 8.8 % — ABNORMAL HIGH (ref 0.0–5.0)
HCT: 38 % (ref 36.0–46.0)
Hemoglobin: 12.7 g/dL (ref 12.0–15.0)
Lymphocytes Relative: 29.8 % (ref 12.0–46.0)
Lymphs Abs: 1.6 10*3/uL (ref 0.7–4.0)
MCHC: 33.6 g/dL (ref 30.0–36.0)
MCV: 93.6 fl (ref 78.0–100.0)
Monocytes Absolute: 0.5 10*3/uL (ref 0.1–1.0)
Monocytes Relative: 10.5 % (ref 3.0–12.0)
Neutro Abs: 2.6 10*3/uL (ref 1.4–7.7)
Neutrophils Relative %: 49.7 % (ref 43.0–77.0)
Platelets: 164 10*3/uL (ref 150.0–400.0)
RBC: 4.06 Mil/uL (ref 3.87–5.11)
RDW: 13.5 % (ref 11.5–15.5)
WBC: 5.2 10*3/uL (ref 4.0–10.5)

## 2022-09-08 LAB — LIPID PANEL
Cholesterol: 164 mg/dL (ref 0–200)
HDL: 58.4 mg/dL (ref >39.00)
LDL Cholesterol: 93 mg/dL (ref 0–99)
NonHDL: 105.98
Total CHOL/HDL Ratio: 3
Triglycerides: 67 mg/dL (ref 0.0–149.0)
VLDL: 13.4 mg/dL (ref 0.0–40.0)

## 2022-09-08 LAB — COMPREHENSIVE METABOLIC PANEL
ALT: 17 U/L (ref 0–35)
AST: 27 U/L (ref 0–37)
Albumin: 4.2 g/dL (ref 3.5–5.2)
Alkaline Phosphatase: 59 U/L (ref 39–117)
BUN: 19 mg/dL (ref 6–23)
CO2: 30 mEq/L (ref 19–32)
Calcium: 9.9 mg/dL (ref 8.4–10.5)
Chloride: 101 mEq/L (ref 96–112)
Creatinine, Ser: 1.12 mg/dL (ref 0.40–1.20)
GFR: 49.61 mL/min — ABNORMAL LOW (ref >60.00)
Glucose, Bld: 81 mg/dL (ref 70–99)
Potassium: 3.8 mEq/L (ref 3.5–5.1)
Sodium: 137 mEq/L (ref 135–145)
Total Bilirubin: 0.6 mg/dL (ref 0.2–1.2)
Total Protein: 7.6 g/dL (ref 6.0–8.3)

## 2022-09-08 LAB — HEMOGLOBIN A1C: Hgb A1c MFr Bld: 6.1 % (ref 4.6–6.5)

## 2022-09-08 LAB — TSH: TSH: 0.32 u[IU]/mL — ABNORMAL LOW (ref 0.35–5.50)

## 2022-09-09 ENCOUNTER — Ambulatory Visit (INDEPENDENT_AMBULATORY_CARE_PROVIDER_SITE_OTHER)
Admission: RE | Admit: 2022-09-09 | Discharge: 2022-09-09 | Disposition: A | Discharge status: Home / Self Care | Payer: Medicare HMO | Source: Ambulatory Visit | Attending: Internal Medicine | Admitting: Internal Medicine

## 2022-09-09 DIAGNOSIS — Z13820 Encounter for screening for osteoporosis: Secondary | ICD-10-CM

## 2022-09-10 ENCOUNTER — Other Ambulatory Visit: Payer: Self-pay | Admitting: Internal Medicine

## 2022-09-10 MED ORDER — LEVOTHYROXINE SODIUM 100 MCG PO TABS: ORAL_TABLET | ORAL | 0 refills | Status: DC

## 2022-09-26 ENCOUNTER — Ambulatory Visit (INDEPENDENT_AMBULATORY_CARE_PROVIDER_SITE_OTHER): Payer: Medicare HMO | Admitting: Orthopaedic Surgery

## 2022-09-26 ENCOUNTER — Ambulatory Visit (INDEPENDENT_AMBULATORY_CARE_PROVIDER_SITE_OTHER): Payer: Medicare HMO

## 2022-09-26 DIAGNOSIS — M1711 Unilateral primary osteoarthritis, right knee: Secondary | ICD-10-CM | POA: Diagnosis not present

## 2022-09-26 DIAGNOSIS — M25561 Pain in right knee: Secondary | ICD-10-CM | POA: Diagnosis not present

## 2022-09-26 DIAGNOSIS — M47816 Spondylosis without myelopathy or radiculopathy, lumbar region: Secondary | ICD-10-CM

## 2022-09-26 MED ORDER — LIDOCAINE HCL 1 % IJ SOLN
3.0000 mL | INTRAMUSCULAR | Status: AC | PRN
  Administered 2022-09-26: 3 mL

## 2022-09-26 MED ORDER — METHYLPREDNISOLONE ACETATE 40 MG/ML IJ SUSP
40.0000 mg | INTRAMUSCULAR | Status: AC | PRN
  Administered 2022-09-26: 40 mg via INTRA_ARTICULAR

## 2022-09-26 NOTE — Progress Notes (Signed)
Office Visit Note   Patient: Gloria [name]           Date of Birth: 03-26-1951           MRN: [id number] Visit Date: 09/26/2022              Requested by: Binnie Rail, MD [place],  [place] [id number] PCP: Binnie Rail, MD   Assessment & Plan: Visit Diagnoses:  1. Spondylosis without myelopathy or radiculopathy, lumbar region   2. Right knee pain, unspecified chronicity   3. Unilateral primary osteoarthritis, right knee     Plan: I was able to aspirate about 30 cc of fluid off of her right knee and placed a steroid injection in her right knee.  I do feel it is reasonable to send her to Dr. Ernestina Patches for L4-L5 facet joint injections bilaterally and she is requesting this as well.  We will see if we can make that happen for her.  All questions and concerns were answered and addressed.  Follow-Up Instructions: No follow-ups on file.   Orders:  Orders Placed This Encounter  Procedures   Large Joint Inj   XR Lumbar Spine 2-3 Views   XR Knee 1-2 Views Right   No orders of the defined types were placed in this encounter.     Procedures: Large Joint Inj: R knee on 09/26/2022 11:07 AM Indications: diagnostic evaluation and pain Details: 22 G 1.5 in needle, superolateral approach  Arthrogram: No  Medications: 3 mL lidocaine 1 %; 40 mg methylPREDNISolone acetate 40 MG/ML Outcome: tolerated well, no immediate complications Procedure, treatment alternatives, risks and benefits explained, specific risks discussed. Consent was given by the patient. Immediately prior to procedure a time out was called to verify the correct patient, procedure, equipment, support staff and site/side marked as required. Patient was prepped and draped in the usual sterile fashion.       Clinical Data: No additional findings.   Subjective: Chief Complaint  Patient presents with   Lower Back - Pain   Right Knee - Pain  The patient is a long-term patient of mine who comes in with  right knee pain has been well-documented with osteoarthritis as well as low back pain.  She last had a left-sided L4-L5 epidural steroid injection by Dr. Ernestina Patches in November 2021.  She said that has helped great but then started develop worsening low back pain.  She did see a neurosurgeon spine specialist.  She eventually had a radiofrequency ablation which she said did not help.  She stated that the neurosurgeon recommended an L4-L5 fusion and potentially fusion from L4 to the sacrum.  She did not want surgery.  It has been well over 6 months since she had the ablation.  She states that she would rather see Dr. Ernestina Patches to consider steroid injections again in her spine.  She denies any radicular symptoms and has pain going across her lower back.  She has been dealing with right knee pain for some time now and is requesting a steroid injection in her right knee.  She has had no acute change in her medical status otherwise.  She is 71 years old.  She does not walk with assistive device.  She is not obese.  HPI  Review of Systems There is no fever, chills, nausea, vomiting  Objective: Vital Signs: There were no vitals taken for this visit.  Physical Exam She is alert and orient x3 and in no acute distress  but does mobilize slowly. Ortho Exam Her lower back has pain with flexion and extension with pain mainly in the facet joints at the back.  There does not seem to be radicular component of her pain today.  Her right knee does have valgus malalignment and a moderate effusion with good range of motion but pain throughout the motion of her knee. Specialty Comments:  No specialty comments available.  Imaging: XR Knee 1-2 Views Right  Result Date: 09/26/2022 2 views of the right knee show tricompartment arthritis with slight valgus malalignment.  There are osteophytes in all 3 compartments with joint space narrowing.  XR Lumbar Spine 2-3 Views  Result Date: 09/26/2022 2 views of the lumbar spine show  severe degenerative changes between L4 and L5 and especially L5-S1.    PMFS History: Patient Active Problem List   Diagnosis Date Noted   Unilateral primary osteoarthritis, right knee 09/26/2022   Head injury 04/15/2022   Bilateral low back pain without sciatica 09/29/2021   Bradycardia 08/20/2020   Acute cervical radiculopathy 07/09/2020   Pruritus 02/06/2020   Raynaud's phenomenon 01/06/2020   Left wrist tendinitis 04/30/2018   Hand numbness 01/24/2018   Lumbar radiculopathy 12/23/2016   Plantar fasciitis of right foot 06/22/2016   Carpal tunnel syndrome of right wrist 03/07/2013   Myasthenia gravis without exacerbation (HCC) 12/31/2010   Alopecia 12/31/2010   Hypothyroidism 12/29/2010   Type 2 diabetes, diet controlled (HCC) 12/29/2010   Hyperlipidemia 12/29/2010   Essential hypertension 12/29/2010   Coronary atherosclerosis 12/29/2010   Osteoarthritis 12/29/2010   Past Medical History:  Diagnosis Date   Alopecia areata 10/2010 onset   Diabetes mellitus, type 2 (HCC)    on meds   Dyslipidemia    Hyperlipidemia    on meds   Hypertension    on meds   Hypothyroid    on meds   Myasthenia gravis     Family History  Problem Relation Age of Onset   Colon cancer Neg Hx    Colon polyps Neg Hx    Esophageal cancer Neg Hx    Rectal cancer Neg Hx    Stomach cancer Neg Hx     Past Surgical History:  Procedure Laterality Date   ABDOMINAL HYSTERECTOMY  12/12/1988   CARPAL TUNNEL RELEASE Left    CARPAL TUNNEL RELEASE Right    COLONOSCOPY  2008   [facility]   Social History   Occupational History   Occupation: retired  Tobacco Use   Smoking status: Never   Smokeless tobacco: Never  Vaping Use   Vaping Use: Never used  Substance and Sexual Activity   Alcohol use: No   Drug use: Never   Sexual activity: Never

## 2022-09-27 ENCOUNTER — Other Ambulatory Visit: Payer: Self-pay

## 2022-09-27 DIAGNOSIS — M47816 Spondylosis without myelopathy or radiculopathy, lumbar region: Secondary | ICD-10-CM

## 2022-09-27 DIAGNOSIS — G8929 Other chronic pain: Secondary | ICD-10-CM

## 2022-09-30 ENCOUNTER — Ambulatory Visit (INDEPENDENT_AMBULATORY_CARE_PROVIDER_SITE_OTHER): Payer: Medicare HMO | Admitting: Physical Medicine and Rehabilitation

## 2022-09-30 ENCOUNTER — Encounter: Payer: Self-pay | Admitting: Physical Medicine and Rehabilitation

## 2022-09-30 VITALS — BP 165/93 | HR 45

## 2022-09-30 DIAGNOSIS — M545 Low back pain, unspecified: Secondary | ICD-10-CM

## 2022-09-30 DIAGNOSIS — M47816 Spondylosis without myelopathy or radiculopathy, lumbar region: Secondary | ICD-10-CM

## 2022-09-30 DIAGNOSIS — G8929 Other chronic pain: Secondary | ICD-10-CM

## 2022-09-30 MED ORDER — DIAZEPAM 5 MG PO TABS: ORAL_TABLET | ORAL | 0 refills | Status: DC

## 2022-09-30 NOTE — Progress Notes (Unsigned)
Numeric Pain Rating Scale and Functional Assessment Average Pain 7   In the last MONTH (on 0-10 scale) has pain interfered with the following?  1. General activity like being  able to carry out your everyday physical activities such as walking, climbing stairs, carrying groceries, or moving a chair?  Rating(10)     Lower back pain in the middle, constant aching. No radiation into legs. Tylenol for pain

## 2022-09-30 NOTE — Progress Notes (Signed)
Gloria [name] - 71 y.o. female MRN [id number]  Date of birth: 1951/09/08  Office Visit Note: Visit Date: 09/30/2022 PCP: Binnie Rail, MD Referred by: Mcarthur Rossetti*  Subjective: Chief Complaint  Patient presents with   Lower Back - Pain   HPI: Gloria [name] is a 71 y.o. female who comes in today Per the request of Dr. Jean Rosenthal for evaluation of chronic, worsening and severe bilateral lower back pain. Pain ongoing for several years and is exacerbated by bending, standing and activity. She describes pain as sore and aching in nature, currently rates as 8 out of 10. Some relief with home exercise regimen, heating pad, rest and use of medications. Patient has attended formal physical therapy in 2022 at [facility], some relief of pain with these treatments. Lumbar MRI imaging from 2021 exhibits multi level facet hypertrophy, most severe at L4-L5 where there is grade 1 anterolisthesis, left sided marrow edema and per report advanced spinal canal stenosis. After review of imaging, spinal canal stenosis at L4-L5 looks to be moderate. Patient did undergo left L4-L5 intra-articular facet injection in our office in 2021 that did provide significant and sustained relief of pain, greater than 80% for several months.   Patient was previously evaluated by Dr. Duffy Rhody at [facility], per his notes he did recommend surgical intervention, possible fusion of L4-L5, would also consider fusion to sacrum. Patient declined surgical intervention at that time. Patient was then referred to Dr. Lenord Carbo for interventional spine procedures. She did undergo diagnostic medial branch blocks followed by radiofrequency ablation that did not provide relief of pain. Patient states severe pain is negatively impacting her daily life and is making it difficult to complete tasks. Patient denies focal weakness, numbness and tingling. Patient denies recent  trauma or falls.    Review of Systems  Musculoskeletal:  Positive for back pain.  Neurological:  Negative for tingling, sensory change, focal weakness and weakness.  All other systems reviewed and are negative.  Otherwise per HPI.  Assessment & Plan: Visit Diagnoses:    ICD-10-CM   1. Chronic bilateral low back pain without sciatica  M54.50 Ambulatory referral to Physical Medicine Rehab   G89.29     2. Spondylosis without myelopathy or radiculopathy, lumbar region  M47.816 Ambulatory referral to Physical Medicine Rehab    3. Facet hypertrophy of lumbar region  M47.816 Ambulatory referral to Physical Medicine Rehab       Plan: Findings:  Chronic, worsening and severe bilateral axial back pain. No radicular symptoms noted. Patient continues to have severe pain despite good conservative therapies such as formal physical therapy, home exercise regimen and medications. Patients clinical presentation and exam are consistent with facet mediated pain. Severe pain noted with lumbar extension today. Next step is to perform diagnostic and hopefully therapeutic bilateral L4-L5 intra-articular facet joint injections under fluoroscopic guidance. Patient did voice concerns about anxiety surrounding injection procedure, I did prescribe pre-procedure Valium for her to take on day of procedure. If her pain seems more radicular in nature we would consider performing lumbar epidural steroid injection, there is moderate spinal canal stenosis noted at the level of L4-L5. Depending on relief with injections we would consider obtaining new lumbar MRI imaging to assess for worsening arthritic changes/stenosis. We also discussed re-grouping with physical therapy as needed. No red flag symptoms noted upon exam today.     Meds & Orders:  Meds ordered this encounter  Medications  diazepam (VALIUM) 5 MG tablet    Sig: Take one tablet by mouth with food one hour prior to procedure. May repeat 30 minutes prior if  needed.    Dispense:  2 tablet    Refill:  0    Orders Placed This Encounter  Procedures   Ambulatory referral to Physical Medicine Rehab    Follow-up: Return for Bilateral L4-L5 intra-articular facet joint injections.   Procedures: No procedures performed      Clinical History: EXAM: MRI LUMBAR SPINE WITHOUT CONTRAST   TECHNIQUE: Multiplanar, multisequence MR imaging of the lumbar spine was performed. No intravenous contrast was administered.   COMPARISON:  Radiography 04/30/2018   FINDINGS: Segmentation:  5 lumbar type vertebrae   Alignment:  Grade 1 anterolisthesis at L4-5   Vertebrae: Marrow edema about the left L4-5 facet. No acute fracture, discitis, or aggressive bone lesion   Conus medullaris and cauda equina: Conus extends to the L2 level. Conus and cauda equina appear normal.   Paraspinal and other soft tissues: Negative   Disc levels:   T12- L1: Unremarkable.   L1-L2: Disc narrowing and bulging. Mild facet spurring. Mild bilateral foraminal narrowing   L2-L3: Unremarkable.   L3-L4: Mild disc narrowing and bulging. Degenerative facet spurring and ligamentum flavum thickening. No neural impingement   L4-L5: Facet osteoarthritis which is advanced with bulky bony and ligamentous hypertrophy, anterolisthesis, and active facet arthritis on the left. The disc is narrowed and bulging and there is advanced spinal stenosis and biforaminal L4 root flattening.   L5-S1:Disc narrowing and bulging with endplate and facet spurring. High-grade Biforaminal impingement. Patent spinal canal.   IMPRESSION: 1. 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 Combined with disc bulging there is compressive spinal and biforaminal stenosis. 2. L5-S1 degenerative biforaminal impingement. 3. L1-2 noncompressive disc bulging.     Electronically Signed   By: Monte Fantasia M.D.   On: 09/16/2020 09:36   She reports that she has  never smoked. She has never used smokeless tobacco.  Recent Labs    02/22/22 1236 09/07/22 1639  HGBA1C 6.1 6.1    Objective:  VS:  HT:    WT:   BMI:     BP:(!) 165/93  HR:(!) 45bpm  TEMP: ( )  RESP:  Physical Exam Vitals and nursing note reviewed.  HENT:     Head: Normocephalic and atraumatic.     Right Ear: External ear normal.     Left Ear: External ear normal.     Nose: Nose normal.     Mouth/Throat:     Mouth: Mucous membranes are moist.  Eyes:     Extraocular Movements: Extraocular movements intact.  Cardiovascular:     Rate and Rhythm: Normal rate.     Pulses: Normal pulses.  Pulmonary:     Effort: Pulmonary effort is normal.  Abdominal:     General: Abdomen is flat. There is no distension.  Musculoskeletal:        General: Tenderness present.     Cervical back: Normal range of motion.     Comments: Pt is slow to rise from seated position to standing. Concordant low back pain with facet loading, lumbar spine extension and rotation. Strong distal strength without clonus, no pain upon palpation of greater trochanters. Sensation intact bilaterally. Walks independently, gait steady.   Skin:    General: Skin is warm and dry.     Capillary Refill: Capillary refill takes less than 2 seconds.  Neurological:  General: No focal deficit present.     Mental Status: She is alert and oriented to person, place, and time.  Psychiatric:        Mood and Affect: Mood normal.        Behavior: Behavior normal.     Ortho Exam  Imaging: No results found.  Past Medical/Family/Surgical/Social History: Medications & Allergies reviewed per EMR, new medications updated. Patient Active Problem List   Diagnosis Date Noted   Unilateral primary osteoarthritis, right knee 09/26/2022   Head injury 04/15/2022   Bilateral low back pain without sciatica 09/29/2021   Bradycardia 08/20/2020   Acute cervical radiculopathy 07/09/2020   Pruritus 02/06/2020   Raynaud's phenomenon  01/06/2020   Left wrist tendinitis 04/30/2018   Hand numbness 01/24/2018   Lumbar radiculopathy 12/23/2016   Plantar fasciitis of right foot 06/22/2016   Carpal tunnel syndrome of right wrist 03/07/2013   Myasthenia gravis without exacerbation ([place]) 12/31/2010   Alopecia 12/31/2010   Hypothyroidism 12/29/2010   Type 2 diabetes, diet controlled ([place]) 12/29/2010   Hyperlipidemia 12/29/2010   Essential hypertension 12/29/2010   Coronary atherosclerosis 12/29/2010   Osteoarthritis 12/29/2010   Past Medical History:  Diagnosis Date   Alopecia areata 10/2010 onset   Diabetes mellitus, type 2 ([place])    on meds   Dyslipidemia    Hyperlipidemia    on meds   Hypertension    on meds   Hypothyroid    on meds   Myasthenia gravis    Family History  Problem Relation Age of Onset   Colon cancer Neg Hx    Colon polyps Neg Hx    Esophageal cancer Neg Hx    Rectal cancer Neg Hx    Stomach cancer Neg Hx    Past Surgical History:  Procedure Laterality Date   ABDOMINAL HYSTERECTOMY  12/12/1988   CARPAL TUNNEL RELEASE Left    CARPAL TUNNEL RELEASE Right    COLONOSCOPY  2008   [facility]   Social History   Occupational History   Occupation: retired  Tobacco Use   Smoking status: Never   Smokeless tobacco: Never  Vaping Use   Vaping Use: Never used  Substance and Sexual Activity   Alcohol use: No   Drug use: Never   Sexual activity: Never

## 2022-10-12 ENCOUNTER — Ambulatory Visit (INDEPENDENT_AMBULATORY_CARE_PROVIDER_SITE_OTHER): Payer: Medicare HMO | Admitting: Physical Medicine and Rehabilitation

## 2022-10-12 ENCOUNTER — Ambulatory Visit: Payer: Self-pay

## 2022-10-12 VITALS — BP 148/91 | HR 76

## 2022-10-12 DIAGNOSIS — M47816 Spondylosis without myelopathy or radiculopathy, lumbar region: Secondary | ICD-10-CM | POA: Diagnosis not present

## 2022-10-12 MED ORDER — METHYLPREDNISOLONE ACETATE 80 MG/ML IJ SUSP
40.0000 mg | Freq: Once | INTRAMUSCULAR | Status: AC
  Administered 2022-10-12: 40 mg

## 2022-10-12 NOTE — Patient Instructions (Signed)

## 2022-10-12 NOTE — Progress Notes (Signed)
Numeric Pain Rating Scale and Functional Assessment Average Pain 9   In the last MONTH (on 0-10 scale) has pain interfered with the following?  1. General activity like being  able to carry out your everyday physical activities such as walking, climbing stairs, carrying groceries, or moving a chair?  Rating(9)   +Driver, -BT, -Dye Allergies.  Lower back pain on both sides

## 2022-10-12 NOTE — Progress Notes (Unsigned)
4.65mg y, .08 sec

## 2022-10-17 NOTE — Procedures (Signed)
Lumbar Facet Joint Intra-Articular Injection(s) with Fluoroscopic Guidance  Patient: Gloria [name]      Date of Birth: 1951/01/18 MRN: [id number] PCP: Binnie Rail, MD      Visit Date: 10/12/2022   Universal Protocol:    Date/Time: 10/12/2022  Consent Given By: the patient  Position: PRONE   Additional Comments: Vital signs were monitored before and after the procedure. Patient was prepped and draped in the usual sterile fashion. The correct patient, procedure, and site was verified.   Injection Procedure Details:  Procedure Site One Meds Administered:  Meds ordered this encounter  Medications   methylPREDNISolone acetate (DEPO-MEDROL) injection 40 mg     Laterality: Bilateral  Location/Site:  L4-L5  Needle size: 22 guage  Needle type: Spinal  Needle Placement: Articular  Findings:  -Comments: Excellent flow of contrast producing a partial arthrogram.  Procedure Details: The fluoroscope beam is vertically oriented in AP, and the inferior recess is visualized beneath the lower pole of the inferior apophyseal process, which represents the target point for needle insertion. When direct visualization is difficult the target point is located at the medial projection of the vertebral pedicle. The region overlying each aforementioned target is locally anesthetized with a 1 to 2 ml. volume of 1% Lidocaine without Epinephrine.   The spinal needle was inserted into each of the above mentioned facet joints using biplanar fluoroscopic guidance. A 0.25 to 0.5 ml. volume of Isovue-250 was injected and a partial facet joint arthrogram was obtained. A single spot film was obtained of the resulting arthrogram.    One to 1.25 ml of the steroid/anesthetic solution was then injected into each of the facet joints noted above.   Additional Comments:  The patient tolerated the procedure well Dressing: 2 x 2 sterile gauze and Band-Aid    Post-procedure details: Patient was observed  during the procedure. Post-procedure instructions were reviewed.  Patient left the clinic in stable condition.

## 2022-10-17 NOTE — Progress Notes (Signed)
Gloria [name] - 71 y.o. female MRN [id number]  Date of birth: [birth year]  Office Visit Note: Visit Date: 10/12/2022 PCP: Pincus Sanes, MD Referred by: Pincus Sanes, MD  Subjective: Chief Complaint  Patient presents with   Lower Back - Pain   HPI:  [name] is a 71 y.o. female who comes in today at the request of Ellin Goodie, FNP for planned Bilateral  L4-5 Lumbar facet/medial branch block with fluoroscopic guidance.  The patient has failed conservative care including home exercise, medications, time and activity modification.  This injection will be diagnostic and hopefully therapeutic.  Please see requesting physician notes for further details and justification.  Exam has shown concordant pain with facet joint loading.   ROS Otherwise per HPI.  Assessment & Plan: Visit Diagnoses:    ICD-10-CM   1. Spondylosis without myelopathy or radiculopathy, lumbar region  M47.816 XR C-ARM NO REPORT    Facet Injection    methylPREDNISolone acetate (DEPO-MEDROL) injection 40 mg      Plan: No additional findings.   Meds & Orders:  Meds ordered this encounter  Medications   methylPREDNISolone acetate (DEPO-MEDROL) injection 40 mg    Orders Placed This Encounter  Procedures   Facet Injection   XR C-ARM NO REPORT    Follow-up: No follow-ups on file.   Procedures: No procedures performed  Lumbar Facet Joint Intra-Articular Injection(s) with Fluoroscopic Guidance  Patient: Gloria [name]      Date of Birth: 1951-01-12 MRN: [id number] PCP: Pincus Sanes, MD      Visit Date: 10/12/2022   Universal Protocol:    Date/Time: 10/12/2022  Consent Given By: the patient  Position: PRONE   Additional Comments: Vital signs were monitored before and after the procedure. Patient was prepped and draped in the usual sterile fashion. The correct patient, procedure, and site was verified.   Injection Procedure Details:  Procedure Site One Meds Administered:  Meds ordered  this encounter  Medications   methylPREDNISolone acetate (DEPO-MEDROL) injection 40 mg     Laterality: Bilateral  Location/Site:  L4-L5  Needle size: 22 guage  Needle type: Spinal  Needle Placement: Articular  Findings:  -Comments: Excellent flow of contrast producing a partial arthrogram.  Procedure Details: The fluoroscope beam is vertically oriented in AP, and the inferior recess is visualized beneath the lower pole of the inferior apophyseal process, which represents the target point for needle insertion. When direct visualization is difficult the target point is located at the medial projection of the vertebral pedicle. The region overlying each aforementioned target is locally anesthetized with a 1 to 2 ml. volume of 1% Lidocaine without Epinephrine.   The spinal needle was inserted into each of the above mentioned facet joints using biplanar fluoroscopic guidance. A 0.25 to 0.5 ml. volume of Isovue-250 was injected and a partial facet joint arthrogram was obtained. A single spot film was obtained of the resulting arthrogram.    One to 1.25 ml of the steroid/anesthetic solution was then injected into each of the facet joints noted above.   Additional Comments:  The patient tolerated the procedure well Dressing: 2 x 2 sterile gauze and Band-Aid    Post-procedure details: Patient was observed during the procedure. Post-procedure instructions were reviewed.  Patient left the clinic in stable condition.    Clinical History: EXAM: MRI LUMBAR SPINE WITHOUT CONTRAST   TECHNIQUE: Multiplanar, multisequence MR imaging of the lumbar spine was performed. No intravenous contrast was administered.   COMPARISON:  Radiography 04/30/2018   FINDINGS: Segmentation:  5 lumbar type vertebrae   Alignment:  Grade 1 anterolisthesis at L4-5   Vertebrae: Marrow edema about the left L4-5 facet. No acute fracture, discitis, or aggressive bone lesion   Conus medullaris and cauda  equina: Conus extends to the L2 level. Conus and cauda equina appear normal.   Paraspinal and other soft tissues: Negative   Disc levels:   T12- L1: Unremarkable.   L1-L2: Disc narrowing and bulging. Mild facet spurring. Mild bilateral foraminal narrowing   L2-L3: Unremarkable.   L3-L4: Mild disc narrowing and bulging. Degenerative facet spurring and ligamentum flavum thickening. No neural impingement   L4-L5: Facet osteoarthritis which is advanced with bulky bony and ligamentous hypertrophy, anterolisthesis, and active facet arthritis on the left. The disc is narrowed and bulging and there is advanced spinal stenosis and biforaminal L4 root flattening.   L5-S1:Disc narrowing and bulging with endplate and facet spurring. High-grade Biforaminal impingement. Patent spinal canal.   IMPRESSION: 1. 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 Combined with disc bulging there is compressive spinal and biforaminal stenosis. 2. L5-S1 degenerative biforaminal impingement. 3. L1-2 noncompressive disc bulging.     Electronically Signed   By: Monte Fantasia M.D.   On: 09/16/2020 09:36     Objective:  VS:  HT:    WT:   BMI:     BP:(!) 148/91  HR:76bpm  TEMP: ( )  RESP:  Physical Exam Vitals and nursing note reviewed.  Constitutional:      General: She is not in acute distress.    Appearance: Normal appearance. She is not ill-appearing.  HENT:     Head: Normocephalic and atraumatic.     Right Ear: External ear normal.     Left Ear: External ear normal.  Eyes:     Extraocular Movements: Extraocular movements intact.  Cardiovascular:     Rate and Rhythm: Normal rate.     Pulses: Normal pulses.  Pulmonary:     Effort: Pulmonary effort is normal. No respiratory distress.  Abdominal:     General: There is no distension.     Palpations: Abdomen is soft.  Musculoskeletal:        General: Tenderness present.     Cervical back: Neck  supple.     Right lower leg: No edema.     Left lower leg: No edema.     Comments: Patient has good distal strength with no pain over the greater trochanters.  No clonus or focal weakness.  Skin:    Findings: No erythema, lesion or rash.  Neurological:     General: No focal deficit present.     Mental Status: She is alert and oriented to person, place, and time.     Sensory: No sensory deficit.     Motor: No weakness or abnormal muscle tone.     Coordination: Coordination normal.  Psychiatric:        Mood and Affect: Mood normal.        Behavior: Behavior normal.      Imaging: No results found.

## 2022-10-20 ENCOUNTER — Other Ambulatory Visit: Payer: Self-pay | Admitting: Internal Medicine

## 2022-10-26 ENCOUNTER — Telehealth: Payer: Self-pay | Admitting: Internal Medicine

## 2022-10-26 NOTE — Telephone Encounter (Signed)
Left message for patient to call back to schedule Medicare Annual Wellness Visit   Last AWV  11/08/22  Please schedule at anytime with [facility] Advisor if patient calls the office back.      Any questions, please call me at [phone]

## 2022-10-27 ENCOUNTER — Telehealth: Payer: Self-pay | Admitting: Internal Medicine

## 2022-10-27 NOTE — Telephone Encounter (Signed)
Pt called to report for documentation:  TETANUS Vaccination given 10/26/22 at [facility] on [address] [place] [address] Pharmacy said they will fax documentation

## 2022-10-27 NOTE — Telephone Encounter (Signed)
Tetanus has been documented

## 2022-11-14 ENCOUNTER — Ambulatory Visit (INDEPENDENT_AMBULATORY_CARE_PROVIDER_SITE_OTHER): Payer: Medicare HMO

## 2022-11-14 DIAGNOSIS — Z0000 Encounter for general adult medical examination without abnormal findings: Secondary | ICD-10-CM

## 2022-11-14 DIAGNOSIS — M47816 Spondylosis without myelopathy or radiculopathy, lumbar region: Secondary | ICD-10-CM | POA: Insufficient documentation

## 2022-11-14 NOTE — Progress Notes (Signed)
Subjective:   Gloria [name] is a 71 y.o. female who presents for Medicare Annual (Subsequent) preventive examination.  I connected with [place] today by telephone and verified that I am speaking with the correct person using two identifiers. I discussed the limitations, risks, security and privacy concerns of performing an evaluation and management service by telephone and the availability of in person appointments. I also discussed with the patient that there may be a patient responsible charge related to this service. The patient expressed understanding and agreed to proceed. Location patient: home Location provider: Kyra Searles Persons participating in the visit: [place] and Elyse Jarvis, CMA.  Time Spent with patient on telephone encounter: 18 mins  Review of Systems    No ROS. Medicare Wellness Telephone Visit. Additional risk factors are reflected in social history. Cardiac Risk Factors include: advanced age (>67men, >65 women);hypertension;dyslipidemia     Objective:    Today's Vitals   11/14/22 1102  PainSc: 7    There is no height or weight on file to calculate BMI.     11/14/2022   11:22 AM 11/08/2021    3:28 PM 01/12/2021    1:35 PM 08/19/2020    1:42 PM 07/23/2020    5:17 PM 07/11/2020   10:00 PM 08/12/2019    3:36 PM  Advanced Directives  Does Patient Have a Medical Advance Directive? Yes Yes Yes Yes Yes No Yes  Type of Advance Directive Living will Healthcare Power of [place];Living will Living will Living will Living will  Healthcare Power of [place];Living will  Does patient want to make changes to medical advance directive? No - Patient declined  No - Patient declined  No - Patient declined    Copy of Healthcare Power of Attorney in Chart?  No - copy requested     No - copy requested  Would patient like information on creating a medical advance directive?      No - Patient declined     Current Medications (verified) Outpatient Encounter  Medications as of 11/14/2022  Medication Sig   acetaminophen (TYLENOL) 500 MG tablet Take 500 mg by mouth every 6 (six) hours as needed.   amLODipine (NORVASC) 5 MG tablet TAKE 1 TABLET(5 MG) BY MOUTH DAILY   Ascorbic Acid (VITAMIN C PO) Take 1 tablet by mouth daily at 6 (six) AM.   atorvastatin (LIPITOR) 10 MG tablet TAKE 1 TABLET BY MOUTH DAILY AT 6 PM   CALCIUM-MAGNESIUM-ZINC PO Take 1 tablet by mouth daily at 6 (six) AM.   cholecalciferol (VITAMIN D3) 25 MCG (1000 UNIT) tablet Take 1,000 Units by mouth daily.   clobetasol (TEMOVATE) 0.05 % external solution Apply to the affected area every day for 6 weeks   Cyanocobalamin (VITAMIN B 12 PO) Take 1 tablet by mouth daily at 6 (six) AM.   diazepam (VALIUM) 5 MG tablet Take one tablet by mouth with food one hour prior to procedure. May repeat 30 minutes prior if needed.   diclofenac sodium (VOLTAREN) 1 % GEL Apply 4 g topically 4 (four) times daily. (Patient taking differently: Apply 4 g topically 4 (four) times daily as needed.)   Flaxseed Oil (LINSEED OIL) OIL Take by mouth.   Flaxseed, Linseed, (FLAX SEED OIL PO) Take 1,000 mg by mouth daily at 6 (six) AM.   levOCARNitine (CARNITINE PO) Take 1 tablet by mouth daily at 6 (six) AM.   levothyroxine (SYNTHROID) 100 MCG tablet Take 1 tab daily 30 minutes prior to breakfast 6 days a  week   losartan (COZAAR) 100 MG tablet TAKE 1 TABLET(100 MG) BY MOUTH DAILY   meloxicam (MOBIC) 15 MG tablet TAKE 1 TABLET(15 MG) BY MOUTH DAILY   Multiple Vitamins-Minerals (WOMENS 50+ MULTI VITAMIN PO) Take 1 tablet by mouth daily at 6 (six) AM.   Omega-3 1000 MG CAPS Take 1 capsule by mouth daily.   ST JOSEPH ASPIRIN PO Take 81 mg by mouth daily.   triamcinolone cream (KENALOG) 0.1 % APPLY TOPICALLY TO THE AFFECTED AREA TWICE DAILY   VITAMIN E PO Take 1 tablet by mouth daily at 6 (six) AM.   No facility-administered encounter medications on file as of 11/14/2022.    Allergies (verified) Beta adrenergic blockers,  Clindamycin, Lincomycin, Lithium, Morphine, Neomycin, Quinidine, Quinine, Timolol, and Vicodin [hydrocodone-acetaminophen]   History: Past Medical History:  Diagnosis Date   Alopecia areata 10/2010 onset   Diabetes mellitus, type 2 (HCC)    on meds   Dyslipidemia    Hyperlipidemia    on meds   Hypertension    on meds   Hypothyroid    on meds   Myasthenia gravis    Past Surgical History:  Procedure Laterality Date   ABDOMINAL HYSTERECTOMY  12/12/1988   CARPAL TUNNEL RELEASE Left    CARPAL TUNNEL RELEASE Right    COLONOSCOPY  2008   [facility]   Family History  Problem Relation Age of Onset   Colon cancer Neg Hx    Colon polyps Neg Hx    Esophageal cancer Neg Hx    Rectal cancer Neg Hx    Stomach cancer Neg Hx    Social History   Socioeconomic History   Marital status: Divorced    Spouse name: Not on file   Number of children: 3   Years of education: Not on file   Highest education level: Not on file  Occupational History   Occupation: retired  Tobacco Use   Smoking status: Never   Smokeless tobacco: Never  Vaping Use   Vaping Use: Never used  Substance and Sexual Activity   Alcohol use: No   Drug use: Never   Sexual activity: Never  Other Topics Concern   Not on file  Social History Narrative   Not on file   Social Determinants of Health   Financial Resource Strain: Low Risk  (11/14/2022)   Overall Financial Resource Strain (CARDIA)    Difficulty of Paying Living Expenses: Not hard at all  Food Insecurity: No Food Insecurity (11/14/2022)   Hunger Vital Sign    Worried About Running Out of Food in the Last Year: Never true    Ran Out of Food in the Last Year: Never true  Transportation Needs: No Transportation Needs (11/14/2022)   PRAPARE - Administrator, Civil Service (Medical): No    Lack of Transportation (Non-Medical): No  Physical Activity: Insufficiently Active (11/14/2022)   Exercise Vital Sign    Days of Exercise per Week: 2 days     Minutes of Exercise per Session: 60 min  Stress: No Stress Concern Present (11/14/2022)   [employer] of Occupational Health - Occupational Stress Questionnaire    Feeling of Stress : Only a little  Social Connections: Unknown (11/14/2022)   Social Connection and Isolation Panel [NHANES]    Frequency of Communication with Friends and Family: Three times a week    Frequency of Social Gatherings with Friends and Family: Once a week    Attends Religious Services: More than 4 times per  year    Active Member of Clubs or Organizations: No    Attends Banker Meetings: Never    Marital Status: Patient refused    Tobacco Counseling Counseling given: Not Answered   Clinical Intake:  Pre-visit preparation completed: Yes  Pain : 0-10 Pain Score: 7  Pain Type: Chronic pain Pain Location: Back Pain Orientation: Lower Pain Descriptors / Indicators: Aching Pain Onset: More than a month ago Pain Frequency: Constant     Nutritional Status: BMI 25 -29 Overweight Nutritional Risks: None Diabetes: No  How often do you need to have someone help you when you read instructions, pamphlets, or other written materials from your doctor or pharmacy?: 1 - Never What is the last grade level you completed in school?: college degree  Diabetic?not anymore per patient  Interpreter Needed?: No  Information entered by :: Elyse Jarvis, CMA   Activities of Daily Living    11/14/2022   11:24 AM  In your present state of health, do you have any difficulty performing the following activities:  Hearing? 0  Vision? 0  Difficulty concentrating or making decisions? 0  Walking or climbing stairs? 0  Dressing or bathing? 0  Doing errands, shopping? 0  Preparing Food and eating ? N  Using the Toilet? N  In the past six months, have you accidently leaked urine? N  Do you have problems with loss of bowel control? N  Managing your Medications? N  Managing your Finances? N  Housekeeping  or managing your Housekeeping? N    Patient Care Team: Pincus Sanes, MD as PCP - General (Internal Medicine) Caress, Fayrene Fearing, MD (Neurology) Helane Gunther, DPM (Podiatry) Janalyn Harder, MD (Inactive) (Dermatology) Vida Rigger, MD (Gastroenterology) Sallye Lat, MD as Consulting Physician (Ophthalmology) Erlene Quan, Vinnie Level, [facility] (Inactive) (Pharmacist)  Indicate any recent Medical Services you may have received from other than [facility] providers in the past year (date may be approximate).     Assessment:   This is a routine wellness examination for [place].  Hearing/Vision screen Patient denied any hearing difficulty. No hearing aids. Patient wears reading glasses.   Dietary issues and exercise activities discussed: Current Exercise Habits: Home exercise routine, Type of exercise: stretching;treadmill;strength training/weights, Time (Minutes): 60, Frequency (Times/Week): 2, Weekly Exercise (Minutes/Week): 120, Intensity: Mild, Exercise limited by: None identified   Goals Addressed             This Visit's Progress    Patient Stated       Continue current lifestyle and keep up my strength.        Depression Screen    11/14/2022   11:21 AM 09/07/2022    4:33 PM 03/17/2022    2:51 PM 11/08/2021    3:34 PM 11/08/2021    3:27 PM 08/19/2020    1:40 PM 01/06/2020   10:26 AM  PHQ 2/9 Scores  PHQ - 2 Score 0 0 0 0 0 1 0    Fall Risk    11/14/2022   11:24 AM 09/07/2022    4:33 PM 03/17/2022    2:51 PM 11/08/2021    3:29 PM 08/24/2021   11:18 AM  Fall Risk   Falls in the past year? 0 0 0 0 0  Number falls in past yr: 0 0  0 0  Injury with Fall? 0 0  0 0  Risk for fall due to : No Fall Risks No Fall Risks   No Fall Risks  Follow up Falls evaluation completed Falls evaluation  completed  Falls evaluation completed Falls evaluation completed    FALL RISK PREVENTION PERTAINING TO THE HOME:  Any stairs in or around the home? Yes  If so, are there any without handrails? No   Home free of loose throw rugs in walkways, pet beds, electrical cords, etc? Yes  Adequate lighting in your home to reduce risk of falls? Yes   ASSISTIVE DEVICES UTILIZED TO PREVENT FALLS:  Life alert? Yes  Use of a cane, walker or w/c? No  Grab bars in the bathroom? No  Shower chair or bench in shower? No  Elevated toilet seat or a handicapped toilet? No   TIMED UP AND GO:  Was the test performed? No .  Length of time to ambulate 10 feet: N/A sec.   Patient stated that she has no issues with gait or balance; does not use any assistive devices.  Cognitive Function:  Patient is cogitatively intact.      11/14/2022   11:25 AM  6CIT Screen  What Year? 0 points  What month? 0 points  What time? 0 points  Count back from 20 0 points  Months in reverse 0 points  Repeat phrase 0 points  Total Score 0 points    Immunizations Immunization History  Administered Date(s) Administered   Influenza Split 09/05/2012   Influenza,inj,Quad PF,6+ Mos 11/28/2013, 10/17/2014   PFIZER(Purple Top)SARS-COV-2 Vaccination 01/23/2020, 02/17/2020, 09/28/2020, 03/30/2021   Pfizer Covid-19 Vaccine Bivalent Booster 60yrs & up 11/12/2021   Pneumococcal Conjugate-13 01/06/2020   Pneumococcal Polysaccharide-23 09/05/2012   Td 12/12/2009   Tdap 10/26/2022   Zoster Recombinat (Shingrix) 09/22/2021, 11/23/2021    TDAP status: Up to date  Flu Vaccine status: Declined, Education has been provided regarding the importance of this vaccine but patient still declined. Advised may receive this vaccine at local pharmacy or Health Dept. Aware to provide a copy of the vaccination record if obtained from local pharmacy or Health Dept. Verbalized acceptance and understanding.  Pneumococcal vaccine status: Due, Education has been provided regarding the importance of this vaccine. Advised may receive this vaccine at local pharmacy or Health Dept. Aware to provide a copy of the vaccination record if obtained from  local pharmacy or Health Dept. Verbalized acceptance and understanding.  Covid-19 vaccine status: Completed vaccines  Qualifies for Shingles Vaccine? Yes   Zostavax completed No   Shingrix completed? Yes  Screening Tests Health Maintenance  Topic Date Due   FOOT EXAM  01/24/2019   OPHTHALMOLOGY EXAM  11/14/2022 (Originally 07/08/2022)   COVID-19 Vaccine (6 - 2023-24 season) 11/30/2022 (Originally 08/12/2022)   INFLUENZA VACCINE  03/12/2023 (Originally 07/12/2022)   Pneumonia Vaccine 38+ Years old (3 - PPSV23 or PCV20) 09/08/2023 (Originally 01/05/2021)   Diabetic kidney evaluation - Urine ACR  02/23/2023   HEMOGLOBIN A1C  03/08/2023   MAMMOGRAM  03/30/2023   Diabetic kidney evaluation - GFR measurement  09/08/2023   Medicare Annual Wellness (AWV)  11/15/2023   DEXA SCAN  09/10/2027   COLONOSCOPY (Pts 45-30yrs Insurance coverage will need to be confirmed)  01/14/2032   DTaP/Tdap/Td (3 - Td or Tdap) 10/26/2032   Hepatitis C Screening  Completed   HPV VACCINES  Aged Out   Zoster Vaccines- Shingrix  Discontinued    Health Maintenance  Health Maintenance Due  Topic Date Due   FOOT EXAM  01/24/2019    Colorectal cancer screening: Type of screening: Colonoscopy. Completed 01/13/2022. Repeat every 10 years  Mammogram status: Completed 03/29/2022. Repeat every year  Bone Density status: Completed  09/09/2022. Results reflect: Bone density results: NORMAL. Repeat every 5 years.  Lung Cancer Screening: (Low Dose CT Chest recommended if Age 42-80 years, 30 pack-year currently smoking OR have quit w/in 15years.) does not qualify.   Lung Cancer Screening Referral: N/A  Additional Screening:  Hepatitis C Screening: does qualify; Completed 12/22/2015  Vision Screening: Recommended annual ophthalmology exams for early detection of glaucoma and other disorders of the eye. Is the patient up to date with their annual eye exam?  Yes  Who is the provider or what is the name of the office in which  the patient attends annual eye exams? [facility] If pt is not established with a provider, would they like to be referred to a provider to establish care? No .   Dental Screening: Recommended annual dental exams for proper oral hygiene  Community Resource Referral / Chronic Care Management: CRR required this visit?  No   CCM required this visit?  No      Plan:     I have personally reviewed and noted the following in the patient's chart:   Medical and social history Use of alcohol, tobacco or illicit drugs  Current medications and supplements including opioid prescriptions. Patient is not currently taking opioid prescriptions. Functional ability and status Nutritional status Physical activity Advanced directives List of other physicians Hospitalizations, surgeries, and ER visits in previous 12 months Vitals Screenings to include cognitive, depression, and falls Referrals and appointments  In addition, I have reviewed and discussed with patient certain preventive protocols, quality metrics, and best practice recommendations. A written personalized care plan for preventive services as well as general preventive health recommendations were provided to patient.     Marinus MawHannah E Mattia Liford, CMA   11/14/2022   Nurse Notes:  Requested copy of Living Will, patient declines bringing in as she does not want people seeing her health info. Advised patient on HIPAA and tried to insist that this is important for us to have her wishes on file in case she is ever hospitalized, patient still declines.

## 2022-11-14 NOTE — Patient Instructions (Signed)
Please schedule your next Medicare Wellness Visit with your Nurse Health Advisor in 1 year by calling [phone]. 

## 2022-12-14 ENCOUNTER — Telehealth: Payer: Medicare HMO | Admitting: Radiology

## 2022-12-14 ENCOUNTER — Ambulatory Visit (INDEPENDENT_AMBULATORY_CARE_PROVIDER_SITE_OTHER): Payer: Medicare HMO | Admitting: Orthopaedic Surgery

## 2022-12-14 ENCOUNTER — Encounter: Payer: Self-pay | Admitting: Orthopaedic Surgery

## 2022-12-14 DIAGNOSIS — M170 Bilateral primary osteoarthritis of knee: Secondary | ICD-10-CM | POA: Diagnosis not present

## 2022-12-14 DIAGNOSIS — G8929 Other chronic pain: Secondary | ICD-10-CM | POA: Diagnosis not present

## 2022-12-14 DIAGNOSIS — M25561 Pain in right knee: Secondary | ICD-10-CM | POA: Diagnosis not present

## 2022-12-14 DIAGNOSIS — M25562 Pain in left knee: Secondary | ICD-10-CM

## 2022-12-14 DIAGNOSIS — M1712 Unilateral primary osteoarthritis, left knee: Secondary | ICD-10-CM | POA: Diagnosis not present

## 2022-12-14 DIAGNOSIS — M1711 Unilateral primary osteoarthritis, right knee: Secondary | ICD-10-CM

## 2022-12-14 NOTE — Telephone Encounter (Signed)
Please obtain authorization for bilateral knee gel injections-[place]. Thanks.

## 2022-12-14 NOTE — Progress Notes (Signed)
The patient comes in for follow-up as a relates to her knees.  Her right knee is the one we saw her for last in October and drew fluid off of her knee and placed a steroid injection in the knee.  Since we saw her she has had facet joint injections by Dr. Ernestina Patches.  Both knees are hurting her again.  She is never had hyaluronic acid for these knees.  She does have moderate arthritis in both knees with slight valgus malalignment.  She is a thin individual as well.  Neither knee has an effusion today but both knees have global tenderness.  She does walk with a limp.  She would definitely benefit from hyaluronic acid in her knees at this point given the failure conservative treatment including steroid injections.  We will see if we can get this ordered and approved for both knees.  We will be in touch.  She agrees with this treatment plan.

## 2022-12-16 NOTE — Telephone Encounter (Signed)
VOB submitted for Monovisc, bilateral knee  

## 2023-01-02 ENCOUNTER — Other Ambulatory Visit: Payer: Self-pay

## 2023-01-02 ENCOUNTER — Telehealth: Payer: Self-pay

## 2023-01-02 NOTE — Telephone Encounter (Signed)
Faxed completed PA to [facility] at [phone] for Monovisc, bilateral knee. PA pending

## 2023-01-03 ENCOUNTER — Telehealth: Payer: Self-pay

## 2023-01-03 DIAGNOSIS — M1711 Unilateral primary osteoarthritis, right knee: Secondary | ICD-10-CM

## 2023-01-03 DIAGNOSIS — M1712 Unilateral primary osteoarthritis, left knee: Secondary | ICD-10-CM

## 2023-01-03 NOTE — Telephone Encounter (Signed)
Called and left a VM advising patient to CB to schedule for gel injection with Dr. Ninfa Linden or Artis Delay.  See referral tab

## 2023-01-04 ENCOUNTER — Telehealth: Payer: Self-pay | Admitting: Orthopaedic Surgery

## 2023-01-04 NOTE — Telephone Encounter (Signed)
Talked with patient concerning gel injection.  

## 2023-01-04 NOTE — Telephone Encounter (Signed)
Patient called concerning the gel injections. Patient wanted to know if the gel injection is better than the cortisone injection and how long will to take to start working?  Patient asked if she can get a call back on her cell number? The number is [phone]

## 2023-01-05 ENCOUNTER — Telehealth: Payer: Self-pay

## 2023-01-05 NOTE — Telephone Encounter (Signed)
Patient would like a call back from Dr. Ninfa Linden to discuss the gel injection before receiving it.  I have talked with patient and explained it to her, but she would to talk with him.  CB# [phone] or [phone].  Please advise.  Thank you.

## 2023-01-25 ENCOUNTER — Encounter: Payer: Self-pay | Admitting: Orthopaedic Surgery

## 2023-01-25 ENCOUNTER — Ambulatory Visit (INDEPENDENT_AMBULATORY_CARE_PROVIDER_SITE_OTHER): Payer: Medicare HMO | Admitting: Orthopaedic Surgery

## 2023-01-25 DIAGNOSIS — M170 Bilateral primary osteoarthritis of knee: Secondary | ICD-10-CM | POA: Diagnosis not present

## 2023-01-25 DIAGNOSIS — M1712 Unilateral primary osteoarthritis, left knee: Secondary | ICD-10-CM

## 2023-01-25 DIAGNOSIS — M1711 Unilateral primary osteoarthritis, right knee: Secondary | ICD-10-CM

## 2023-01-25 MED ORDER — LIDOCAINE HCL 1 % IJ SOLN
3.0000 mL | INTRAMUSCULAR | Status: AC | PRN
  Administered 2023-01-25: 3 mL

## 2023-01-25 MED ORDER — HYALURONAN 88 MG/4ML IX SOSY
88.0000 mg | PREFILLED_SYRINGE | INTRA_ARTICULAR | Status: AC | PRN
  Administered 2023-01-25: 88 mg via INTRA_ARTICULAR

## 2023-01-25 NOTE — Progress Notes (Signed)
   Procedure Note  Patient: Gloria [name]             Date of Birth: [birth year]           MRN: [id number]             Visit Date: 01/25/2023  Procedures: Visit Diagnoses:  1. Unilateral primary osteoarthritis, left knee   2. Unilateral primary osteoarthritis, right knee     Large Joint Inj: R knee on 01/25/2023 11:26 AM Indications: diagnostic evaluation and pain Details: 22 G 1.5 in needle, superolateral approach  Arthrogram: No  Medications: 3 mL lidocaine 1 %; 88 mg Hyaluronan 88 MG/4ML Outcome: tolerated well, no immediate complications Procedure, treatment alternatives, risks and benefits explained, specific risks discussed. Consent was given by the patient. Immediately prior to procedure a time out was called to verify the correct patient, procedure, equipment, support staff and site/side marked as required. Patient was prepped and draped in the usual sterile fashion.    Large Joint Inj: L knee on 01/25/2023 11:27 AM Indications: diagnostic evaluation and pain Details: 22 G 1.5 in needle, superolateral approach  Arthrogram: No  Medications: 88 mg Hyaluronan 88 MG/4ML Outcome: tolerated well, no immediate complications Procedure, treatment alternatives, risks and benefits explained, specific risks discussed. Consent was given by the patient. Immediately prior to procedure a time out was called to verify the correct patient, procedure, equipment, support staff and site/side marked as required. Patient was prepped and draped in the usual sterile fashion.    The patient comes in today for bilateral hyaluronic acid injections in her knees to treat the pain from osteoarthritis.  She has tried and failed other conservative treatment measures and has known significant arthritis with valgus malalignment of her knees.  Her right knee today actually has an effusion but her left knee does not.  I was able to aspirate 30 to 40 cc of clear yellow fluid from the right knee.  We did not place  Monovisc in both knees without difficulty.  Follow-up is as needed.  This can always be repeated in 6 months.  The only other option is considering knee replacement surgery.  Lot number: [id number] expiration date: 04/10/2025

## 2023-02-23 ENCOUNTER — Encounter: Payer: Self-pay | Admitting: Physician Assistant

## 2023-02-23 ENCOUNTER — Ambulatory Visit (INDEPENDENT_AMBULATORY_CARE_PROVIDER_SITE_OTHER): Payer: Medicare HMO | Admitting: Physician Assistant

## 2023-02-23 DIAGNOSIS — M1711 Unilateral primary osteoarthritis, right knee: Secondary | ICD-10-CM | POA: Diagnosis not present

## 2023-02-23 MED ORDER — LIDOCAINE HCL 1 % IJ SOLN
5.0000 mL | INTRAMUSCULAR | Status: AC | PRN
  Administered 2023-02-23: 5 mL

## 2023-02-23 NOTE — Progress Notes (Signed)
Office Visit Note   Patient: Gloria [name]           Date of Birth: July 08, 1951           MRN: [id number] Visit Date: 02/23/2023              Requested by: Binnie Rail, MD [place],  [place] [id number] PCP: Binnie Rail, MD  Chief Complaint  Patient presents with  . Right Knee - Pain      HPI: Gloria [name] is a very pleasant 72 year old woman who is a patient of Dr. Trevor Mace.  She is status post viscosupplementation about a month ago.  She says at that time some fluid was removed from her right knee.  She has had a room accumulation of the fluid in her right knee which makes it stiff and painful.  She is requesting that aspiration today.  No new injury no fever or chills  Assessment & Plan: Visit Diagnoses:  1. Unilateral primary osteoarthritis, right knee     Plan: Patient may follow-up with Dr. Ninfa Linden as needed.  We discussed that certainly if she had some return of her pain she could also consider repeat steroid injections as long as it has been 3 months  Follow-Up Instructions: Return if symptoms worsen or fail to improve.   Ortho Exam  Patient is alert, oriented, no adenopathy, well-dressed, normal affect, normal respiratory effort. Examination of her right knee no red redness mild warmth but no evidence of any infective process compartments are soft and nontender she does have a moderate effusion that is palpable  Imaging: No results found. No images are attached to the encounter.  Labs: Lab Results  Component Value Date   HGBA1C 6.1 09/07/2022   HGBA1C 6.1 02/22/2022   HGBA1C 5.6 08/24/2021   HGBA1C 5.6 08/24/2021   HGBA1C 5.6 (A) 08/24/2021   HGBA1C 5.6 08/24/2021   ESRSEDRATE 11 07/22/2013     Lab Results  Component Value Date   ALBUMIN 4.2 09/07/2022   ALBUMIN 4.4 02/22/2022   ALBUMIN 3.7 08/11/2021    Lab Results  Component Value Date   MG 2.0 07/25/2018   No results found for: "VD25OH"  No results found for:  "PREALBUMIN"    Latest Ref Rng & Units 09/07/2022    4:39 PM 08/11/2021    9:58 AM 08/11/2021    9:41 AM  CBC EXTENDED  WBC 4.0 - 10.5 K/uL 5.2   3.8   RBC 3.87 - 5.11 Mil/uL 4.06   3.94   Hemoglobin 12.0 - 15.0 g/dL 12.7  13.3  12.6   HCT 36.0 - 46.0 % 38.0  39.0  36.5   Platelets 150.0 - 400.0 K/uL 164.0   172   NEUT# 1.4 - 7.7 K/uL 2.6     Lymph# 0.7 - 4.0 K/uL 1.6        There is no height or weight on file to calculate BMI.  Orders:  No orders of the defined types were placed in this encounter.  No orders of the defined types were placed in this encounter.    Procedures: Large Joint Inj on 02/23/2023 3:29 PM Indications: pain and diagnostic evaluation Details: 22 G 1.5 in needle, superolateral approach  Arthrogram: No  Medications: 5 mL lidocaine 1 % Aspirate: blood-tinged Outcome: tolerated well, no immediate complications  After obtaining verbal consent the superior lateral pouch of her knee was prepped with alcohol and Betadine.  5 cc of lidocaine plain was injected.  After adequate anesthetic 18-gauge needle on a 25-gauge syringe was inserted.  Ultimately 30 cc of blood-tinged joint fluid was aspirated.  Band-Aid and compressive dressing were applied patient self ambulated from the clinic Procedure, treatment alternatives, risks and benefits explained, specific risks discussed. Consent was given by the patient.    Clinical Data: No additional findings.  ROS:  All other systems negative, except as noted in the HPI. Review of Systems  Objective: Vital Signs: There were no vitals taken for this visit.  Specialty Comments:  EXAM: MRI LUMBAR SPINE WITHOUT CONTRAST   TECHNIQUE: Multiplanar, multisequence MR imaging of the lumbar spine was performed. No intravenous contrast was administered.   COMPARISON:  Radiography 04/30/2018   FINDINGS: Segmentation:  5 lumbar type vertebrae   Alignment:  Grade 1 anterolisthesis at L4-5   Vertebrae: Marrow edema  about the left L4-5 facet. No acute fracture, discitis, or aggressive bone lesion   Conus medullaris and cauda equina: Conus extends to the L2 level. Conus and cauda equina appear normal.   Paraspinal and other soft tissues: Negative   Disc levels:   T12- L1: Unremarkable.   L1-L2: Disc narrowing and bulging. Mild facet spurring. Mild bilateral foraminal narrowing   L2-L3: Unremarkable.   L3-L4: Mild disc narrowing and bulging. Degenerative facet spurring and ligamentum flavum thickening. No neural impingement   L4-L5: Facet osteoarthritis which is advanced with bulky bony and ligamentous hypertrophy, anterolisthesis, and active facet arthritis on the left. The disc is narrowed and bulging and there is advanced spinal stenosis and biforaminal L4 root flattening.   L5-S1:Disc narrowing and bulging with endplate and facet spurring. High-grade Biforaminal impingement. Patent spinal canal.   IMPRESSION: 1. 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 Combined with disc bulging there is compressive spinal and biforaminal stenosis. 2. L5-S1 degenerative biforaminal impingement. 3. L1-2 noncompressive disc bulging.     Electronically Signed   By: Monte Fantasia M.D.   On: 09/16/2020 09:36  PMFS History: Patient Active Problem List   Diagnosis Date Noted  . Arthropathy of lumbar facet joint 11/14/2022  . Unilateral primary osteoarthritis, right knee 09/26/2022  . Head injury 04/15/2022  . Bilateral low back pain without sciatica 09/29/2021  . Bradycardia 08/20/2020  . Acute cervical radiculopathy 07/09/2020  . Pruritus 02/06/2020  . Raynaud's phenomenon 01/06/2020  . Left wrist tendinitis 04/30/2018  . Hand numbness 01/24/2018  . Lumbar radiculopathy 12/23/2016  . Plantar fasciitis of right foot 06/22/2016  . Carpal tunnel syndrome of right wrist 03/07/2013  . Myasthenia gravis without exacerbation ([place]) 12/31/2010  . Alopecia  12/31/2010  . Hypothyroidism 12/29/2010  . Type 2 diabetes, diet controlled ([place]) 12/29/2010  . Hyperlipidemia 12/29/2010  . Essential hypertension 12/29/2010  . Coronary atherosclerosis 12/29/2010  . Osteoarthritis 12/29/2010   Past Medical History:  Diagnosis Date  . Alopecia areata 10/2010 onset  . Diabetes mellitus, type 2 ([place])    on meds  . Dyslipidemia   . Hyperlipidemia    on meds  . Hypertension    on meds  . Hypothyroid    on meds  . Myasthenia gravis     Family History  Problem Relation Age of Onset  . Colon cancer Neg Hx   . Colon polyps Neg Hx   . Esophageal cancer Neg Hx   . Rectal cancer Neg Hx   . Stomach cancer Neg Hx     Past Surgical History:  Procedure Laterality Date  . ABDOMINAL HYSTERECTOMY  12/12/1988  . CARPAL  TUNNEL RELEASE Left   . CARPAL TUNNEL RELEASE Right   . COLONOSCOPY  2008   [facility]   Social History   Occupational History  . Occupation: retired  Tobacco Use  . Smoking status: Never  . Smokeless tobacco: Never  Vaping Use  . Vaping Use: Never used  Substance and Sexual Activity  . Alcohol use: No  . Drug use: Never  . Sexual activity: Never

## 2023-02-24 ENCOUNTER — Telehealth: Payer: Self-pay

## 2023-02-28 NOTE — Progress Notes (Signed)
   02/28/2023  Patient ID: Gloria [name], female   DOB: [birth year], 72 y.o.   MRN: [id number]  Patient appearing on report for [employer] - Hypertension Control report due to last documented ambulatory blood pressure of 148/91 on 10/12/22. Next appointment with PCP is 03/08/23   Outreached patient to discuss hypertension control and medication management. Telephone visit scheduled for 03/06/23.  Darlina Guys, PharmD, DPLA

## 2023-03-02 ENCOUNTER — Other Ambulatory Visit: Payer: Self-pay

## 2023-03-02 ENCOUNTER — Other Ambulatory Visit: Payer: Self-pay | Admitting: Internal Medicine

## 2023-03-02 ENCOUNTER — Other Ambulatory Visit: Payer: Medicare HMO

## 2023-03-02 ENCOUNTER — Ambulatory Visit (INDEPENDENT_AMBULATORY_CARE_PROVIDER_SITE_OTHER): Payer: Medicare HMO | Admitting: Physician Assistant

## 2023-03-02 ENCOUNTER — Encounter: Payer: Self-pay | Admitting: Physician Assistant

## 2023-03-02 DIAGNOSIS — M1711 Unilateral primary osteoarthritis, right knee: Secondary | ICD-10-CM

## 2023-03-02 MED ORDER — LIDOCAINE HCL 1 % IJ SOLN
2.0000 mL | INTRAMUSCULAR | Status: AC | PRN
  Administered 2023-03-02: 2 mL

## 2023-03-02 MED ORDER — METHYLPREDNISOLONE ACETATE 40 MG/ML IJ SUSP
80.0000 mg | INTRAMUSCULAR | Status: AC | PRN
  Administered 2023-03-02: 80 mg via INTRA_ARTICULAR

## 2023-03-02 MED ORDER — DICLOFENAC SODIUM 1 % EX GEL: 4.0000 g | Freq: Four times a day (QID) | CUTANEOUS | 5 refills | Status: AC

## 2023-03-02 MED ORDER — CLOBETASOL PROPIONATE 0.05 % EX SOLN: CUTANEOUS | 2 refills | Status: DC

## 2023-03-02 MED ORDER — BUPIVACAINE HCL 0.25 % IJ SOLN
2.0000 mL | INTRAMUSCULAR | Status: AC | PRN
  Administered 2023-03-02: 2 mL via INTRA_ARTICULAR

## 2023-03-02 NOTE — Progress Notes (Unsigned)
Patient appearing on report for [employer] - Hypertension Control report due to last documented ambulatory blood pressure of 148/91 on 10/12/22. Next appointment with PCP is 03/08/23   Outreached patient to discuss hypertension control and medication management.   HTN Current antihypertensives: amlodipine 5mg  daily, losartan 100mg  daily  -Patient does not have a working automated upper arm home BP machine. -Patient denies side effects related to current regimen  HLD -Current medications: atorvastatin 10mg  daily, ASA 81mg  daily -Lipid Panel 02/22/22:  TC 155, TG 163, HDL 53, LDL 69  Medication Management -Patient endorses excellent adherence to medications -No barriers in regard to medication access or affordability -States she needs refills on clobetasol solution and diclofenac gel   Assessment/Plan:  HTN - Currently uncontrolled based on last clinic reading - Educated patient that many [employer] plans have an OTC benefit that would cover a new BP monitor.  Provided phone number to call to see if her plan is eligible.  Also, gave recommendations for monitors that can be purchased at her retail pharmacy for </= $30 -Patient contacted plan and was able to verify they will cover BP monitor, but a prescription needs to be sent to [place].  Pending script for Dr. Quay Burow to sign for this. -Recommend increasing amlodipine to 10mg  daily if patient consistently >130/80  HLD -Moderate intensity statin, atorvastatin 10mg , appropriate- continue current regimen at this time -Suggesting lipid panel at upcoming visit  Medication Management -Pending refills for clobetasol and diclofenac for Dr. Quay Burow to sign if in agreement   Follow-Up: Not necessary from pharmacy point-of-view at this time  Darlina Guys, PharmD, DPLA

## 2023-03-02 NOTE — Progress Notes (Signed)
Office Visit Note   Patient: Gloria [name]           Date of Birth: 08/14/1951           MRN: [id number] Visit Date: 03/02/2023              Requested by: Binnie Rail, MD [place],  [place] [address] PCP: Binnie Rail, MD  Chief Complaint  Patient presents with  . Right Knee - Pain      HPI: Tasia is a pleasant 72 year old woman with a history of right greater than left knee arthritis.  She is a patient of Dr. Trevor Mace.  She did go undergo viscosupplementation about 6 weeks ago.  She did get some relief in her left knee but still has continued right knee pain.  She has had good luck with steroid injections into her knees she is wondering if she can have a steroid injection into her right knee today last injection was in October.  She is also complaining that she has had some ankle swelling but that seems to be improved today.  Assessment & Plan: Visit Diagnoses: Osteoarthritis right knee  Plan: Will go forward with a steroid injection today.  Would like for her to follow-up in a few weeks with Dr. Ninfa Linden.  Do not see any swelling in her ankle today she has good dorsiflexion plantarflexion eversion inversion she has no swelling in her calf no erythema compartments are soft nontender negative Bevelyn Buckles' sign we discussed trying a compression stocking  Follow-Up Instructions: Return in about 3 weeks (around 03/23/2023).   Ortho Exam  Patient is alert, oriented, no adenopathy, well-dressed, normal affect, normal respiratory effort. Right knee no effusion erythema.  Compartments of the lower leg are soft nontender negative Homans' sign I cannot appreciate any swelling in her ankle today.  She is neurovascular intact  Imaging: No results found. No images are attached to the encounter.  Labs: Lab Results  Component Value Date   HGBA1C 6.1 09/07/2022   HGBA1C 6.1 02/22/2022   HGBA1C 5.6 08/24/2021   HGBA1C 5.6 08/24/2021   HGBA1C 5.6 (A) 08/24/2021    HGBA1C 5.6 08/24/2021   ESRSEDRATE 11 07/22/2013     Lab Results  Component Value Date   ALBUMIN 4.2 09/07/2022   ALBUMIN 4.4 02/22/2022   ALBUMIN 3.7 08/11/2021    Lab Results  Component Value Date   MG 2.0 07/25/2018   No results found for: "VD25OH"  No results found for: "PREALBUMIN"    Latest Ref Rng & Units 09/07/2022    4:39 PM 08/11/2021    9:58 AM 08/11/2021    9:41 AM  CBC EXTENDED  WBC 4.0 - 10.5 K/uL 5.2   3.8   RBC 3.87 - 5.11 Mil/uL 4.06   3.94   Hemoglobin 12.0 - 15.0 g/dL 12.7  13.3  12.6   HCT 36.0 - 46.0 % 38.0  39.0  36.5   Platelets 150.0 - 400.0 K/uL 164.0   172   NEUT# 1.4 - 7.7 K/uL 2.6     Lymph# 0.7 - 4.0 K/uL 1.6        There is no height or weight on file to calculate BMI.  Orders:  No orders of the defined types were placed in this encounter.  No orders of the defined types were placed in this encounter.    Procedures: Large Joint Inj on 03/02/2023 2:56 PM Indications: pain and diagnostic evaluation Details: 25 G 1.5 in needle,  anteromedial approach  Arthrogram: No  Medications: 80 mg methylPREDNISolone acetate 40 MG/ML; 2 mL lidocaine 1 %; 2 mL bupivacaine 0.25 % Outcome: tolerated well, no immediate complications Procedure, treatment alternatives, risks and benefits explained, specific risks discussed. Consent was given by the patient.    Clinical Data: No additional findings.  ROS:  All other systems negative, except as noted in the HPI. Review of Systems  Objective: Vital Signs: There were no vitals taken for this visit.  Specialty Comments:  EXAM: MRI LUMBAR SPINE WITHOUT CONTRAST   TECHNIQUE: Multiplanar, multisequence MR imaging of the lumbar spine was performed. No intravenous contrast was administered.   COMPARISON:  Radiography 04/30/2018   FINDINGS: Segmentation:  5 lumbar type vertebrae   Alignment:  Grade 1 anterolisthesis at L4-5   Vertebrae: Marrow edema about the left L4-5 facet. No  acute fracture, discitis, or aggressive bone lesion   Conus medullaris and cauda equina: Conus extends to the L2 level. Conus and cauda equina appear normal.   Paraspinal and other soft tissues: Negative   Disc levels:   T12- L1: Unremarkable.   L1-L2: Disc narrowing and bulging. Mild facet spurring. Mild bilateral foraminal narrowing   L2-L3: Unremarkable.   L3-L4: Mild disc narrowing and bulging. Degenerative facet spurring and ligamentum flavum thickening. No neural impingement   L4-L5: Facet osteoarthritis which is advanced with bulky bony and ligamentous hypertrophy, anterolisthesis, and active facet arthritis on the left. The disc is narrowed and bulging and there is advanced spinal stenosis and biforaminal L4 root flattening.   L5-S1:Disc narrowing and bulging with endplate and facet spurring. High-grade Biforaminal impingement. Patent spinal canal.   IMPRESSION: 1. 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 Combined with disc bulging there is compressive spinal and biforaminal stenosis. 2. L5-S1 degenerative biforaminal impingement. 3. L1-2 noncompressive disc bulging.     Electronically Signed   By: Monte Fantasia M.D.   On: 09/16/2020 09:36  PMFS History: Patient Active Problem List   Diagnosis Date Noted  . Arthropathy of lumbar facet joint 11/14/2022  . Unilateral primary osteoarthritis, right knee 09/26/2022  . Head injury 04/15/2022  . Bilateral low back pain without sciatica 09/29/2021  . Bradycardia 08/20/2020  . Acute cervical radiculopathy 07/09/2020  . Pruritus 02/06/2020  . Raynaud's phenomenon 01/06/2020  . Left wrist tendinitis 04/30/2018  . Hand numbness 01/24/2018  . Lumbar radiculopathy 12/23/2016  . Plantar fasciitis of right foot 06/22/2016  . Carpal tunnel syndrome of right wrist 03/07/2013  . Myasthenia gravis without exacerbation ([place]) 12/31/2010  . Alopecia 12/31/2010  . Hypothyroidism  12/29/2010  . Type 2 diabetes, diet controlled ([place]) 12/29/2010  . Hyperlipidemia 12/29/2010  . Essential hypertension 12/29/2010  . Coronary atherosclerosis 12/29/2010  . Osteoarthritis 12/29/2010   Past Medical History:  Diagnosis Date  . Alopecia areata 10/2010 onset  . Diabetes mellitus, type 2 ([place])    on meds  . Dyslipidemia   . Hyperlipidemia    on meds  . Hypertension    on meds  . Hypothyroid    on meds  . Myasthenia gravis     Family History  Problem Relation Age of Onset  . Colon cancer Neg Hx   . Colon polyps Neg Hx   . Esophageal cancer Neg Hx   . Rectal cancer Neg Hx   . Stomach cancer Neg Hx     Past Surgical History:  Procedure Laterality Date  . ABDOMINAL HYSTERECTOMY  12/12/1988  . CARPAL TUNNEL RELEASE Left   .  CARPAL TUNNEL RELEASE Right   . COLONOSCOPY  2008   [facility]   Social History   Occupational History  . Occupation: retired  Tobacco Use  . Smoking status: Never  . Smokeless tobacco: Never  Vaping Use  . Vaping Use: Never used  Substance and Sexual Activity  . Alcohol use: No  . Drug use: Never  . Sexual activity: Never

## 2023-03-03 ENCOUNTER — Other Ambulatory Visit: Payer: Self-pay | Admitting: Internal Medicine

## 2023-03-05 MED ORDER — BLOOD PRESSURE MONITOR DEVI: 0 refills | Status: DC

## 2023-03-05 MED ORDER — DICLOFENAC SODIUM 1 % EX GEL: 4.0000 g | Freq: Four times a day (QID) | CUTANEOUS | 0 refills | Status: DC

## 2023-03-05 MED ORDER — CLOBETASOL PROPIONATE 0.05 % EX SOLN: CUTANEOUS | 0 refills | Status: DC

## 2023-03-06 ENCOUNTER — Other Ambulatory Visit: Payer: Medicare HMO

## 2023-03-06 DIAGNOSIS — H04123 Dry eye syndrome of bilateral lacrimal glands: Secondary | ICD-10-CM | POA: Diagnosis not present

## 2023-03-06 DIAGNOSIS — H43813 Vitreous degeneration, bilateral: Secondary | ICD-10-CM | POA: Diagnosis not present

## 2023-03-06 DIAGNOSIS — H2513 Age-related nuclear cataract, bilateral: Secondary | ICD-10-CM | POA: Diagnosis not present

## 2023-03-07 ENCOUNTER — Encounter: Payer: Self-pay | Admitting: Internal Medicine

## 2023-03-07 NOTE — Patient Instructions (Addendum)
    Monitor your BP - we want it to be less than 130/80    Blood work was ordered.   The lab is on the first floor.    Medications changes include :  stop losartan - start telmisartan 80 mg daily.         Return in about 6 months (around 09/08/2023) for Physical Exam.

## 2023-03-07 NOTE — Progress Notes (Unsigned)
Subjective:    Patient ID: Gloria [name], female    DOB: 1951-04-24, 73 y.o.   MRN: [id number]     HPI Carshena is here for follow up of her chronic medical problems.  BP not controlled - adjust meds  Inc lipitor to 2-  Sent BP cuff in for her to monitor BP at home.   Medications and allergies reviewed with patient and updated if appropriate.  Current Outpatient Medications on File Prior to Visit  Medication Sig Dispense Refill   acetaminophen (TYLENOL) 500 MG tablet Take 500 mg by mouth every 6 (six) hours as needed.     amLODipine (NORVASC) 5 MG tablet TAKE 1 TABLET(5 MG) BY MOUTH DAILY 90 tablet 2   Ascorbic Acid (VITAMIN C PO) Take 1 tablet by mouth daily at 6 (six) AM.     atorvastatin (LIPITOR) 10 MG tablet TAKE 1 TABLET BY MOUTH DAILY AT 6 PM 90 tablet 2   Blood Pressure Monitor DEVI Use to check blood pressure once daily 1 each 0   CALCIUM-MAGNESIUM-ZINC PO Take 1 tablet by mouth daily at 6 (six) AM.     cholecalciferol (VITAMIN D3) 25 MCG (1000 UNIT) tablet Take 1,000 Units by mouth daily.     clobetasol (TEMOVATE) 0.05 % external solution Apply to the affected area every day for 6 weeks 50 mL 0   clobetasol (TEMOVATE) 0.05 % external solution APPLY TOPICALLY TO THE AFFECTED AREA EVERY DAY FOR 6 WEEKS 50 mL 2   Cyanocobalamin (VITAMIN B 12 PO) Take 1 tablet by mouth daily at 6 (six) AM.     diclofenac Sodium (VOLTAREN) 1 % GEL Apply 4 g topically 4 (four) times daily. 100 g 0   diclofenac Sodium (VOLTAREN) 1 % GEL Apply 4 g topically 4 (four) times daily. 100 g 5   levothyroxine (SYNTHROID) 100 MCG tablet TAKE 1 TABLET BY MOUTH DAILY 30 MINS PRIOR TO BRAKFAST 6 DAYS A WEEK AND 1/2 TABLET ONE DAY A WEEK 90 tablet 0   losartan (COZAAR) 100 MG tablet TAKE 1 TABLET(100 MG) BY MOUTH DAILY 90 tablet 2   Multiple Vitamins-Minerals (WOMENS 50+ MULTI VITAMIN PO) Take 1 tablet by mouth daily at 6 (six) AM.     Omega-3 1000 MG CAPS Take 1 capsule by mouth daily.     ST  JOSEPH ASPIRIN PO Take 81 mg by mouth daily.     triamcinolone cream (KENALOG) 0.1 % APPLY TOPICALLY TO THE AFFECTED AREA TWICE DAILY 30 g 0   No current facility-administered medications on file prior to visit.     Review of Systems     Objective:  There were no vitals filed for this visit. BP Readings from Last 3 Encounters:  10/12/22 (!) 148/91  09/30/22 (!) 165/93  09/07/22 122/74   Wt Readings from Last 3 Encounters:  09/07/22 164 lb (74.4 kg)  04/15/22 162 lb (73.5 kg)  03/17/22 166 lb 4 oz (75.4 kg)   There is no height or weight on file to calculate BMI.    Physical Exam     Lab Results  Component Value Date   WBC 5.2 09/07/2022   HGB 12.7 09/07/2022   HCT 38.0 09/07/2022   PLT 164.0 09/07/2022   GLUCOSE 81 09/07/2022   CHOL 164 09/07/2022   TRIG 67.0 09/07/2022   HDL 58.40 09/07/2022   LDLCALC 93 09/07/2022   ALT 17 09/07/2022   AST 27 09/07/2022   NA 137 09/07/2022  K 3.8 09/07/2022   CL 101 09/07/2022   CREATININE 1.12 09/07/2022   BUN 19 09/07/2022   CO2 30 09/07/2022   TSH 0.32 (L) 09/07/2022   HGBA1C 6.1 09/07/2022   MICROALBUR 3.0 (H) 02/22/2022     Assessment & Plan:    See Problem List for Assessment and Plan of chronic medical problems.

## 2023-03-08 ENCOUNTER — Ambulatory Visit (INDEPENDENT_AMBULATORY_CARE_PROVIDER_SITE_OTHER): Payer: Medicare HMO | Admitting: Internal Medicine

## 2023-03-08 ENCOUNTER — Telehealth: Payer: Self-pay | Admitting: Internal Medicine

## 2023-03-08 VITALS — BP 132/80 | HR 78 | Temp 98.0°F | Ht 66.0 in | Wt 160.0 lb

## 2023-03-08 DIAGNOSIS — E039 Hypothyroidism, unspecified: Secondary | ICD-10-CM

## 2023-03-08 DIAGNOSIS — E119 Type 2 diabetes mellitus without complications: Secondary | ICD-10-CM

## 2023-03-08 DIAGNOSIS — N1831 Chronic kidney disease, stage 3a: Secondary | ICD-10-CM

## 2023-03-08 DIAGNOSIS — Z0000 Encounter for general adult medical examination without abnormal findings: Secondary | ICD-10-CM | POA: Diagnosis not present

## 2023-03-08 DIAGNOSIS — I10 Essential (primary) hypertension: Secondary | ICD-10-CM | POA: Diagnosis not present

## 2023-03-08 DIAGNOSIS — E7849 Other hyperlipidemia: Secondary | ICD-10-CM | POA: Diagnosis not present

## 2023-03-08 DIAGNOSIS — N183 Chronic kidney disease, stage 3 unspecified: Secondary | ICD-10-CM | POA: Insufficient documentation

## 2023-03-08 LAB — LIPID PANEL
Cholesterol: 183 mg/dL (ref 0–200)
HDL: 59.2 mg/dL (ref >39.00)
LDL Cholesterol: 99 mg/dL (ref 0–99)
NonHDL: 123.41
Total CHOL/HDL Ratio: 3
Triglycerides: 123 mg/dL (ref 0.0–149.0)
VLDL: 24.6 mg/dL (ref 0.0–40.0)

## 2023-03-08 LAB — CBC WITH DIFFERENTIAL/PLATELET
Basophils Absolute: 0 10*3/uL (ref 0.0–0.1)
Basophils Relative: 0.8 % (ref 0.0–3.0)
Eosinophils Absolute: 0.4 10*3/uL (ref 0.0–0.7)
Eosinophils Relative: 7.2 % — ABNORMAL HIGH (ref 0.0–5.0)
HCT: 41.6 % (ref 36.0–46.0)
Hemoglobin: 13.8 g/dL (ref 12.0–15.0)
Lymphocytes Relative: 29.2 % (ref 12.0–46.0)
Lymphs Abs: 1.7 10*3/uL (ref 0.7–4.0)
MCHC: 33.1 g/dL (ref 30.0–36.0)
MCV: 94.4 fl (ref 78.0–100.0)
Monocytes Absolute: 0.8 10*3/uL (ref 0.1–1.0)
Monocytes Relative: 13.6 % — ABNORMAL HIGH (ref 3.0–12.0)
Neutro Abs: 2.8 10*3/uL (ref 1.4–7.7)
Neutrophils Relative %: 49.2 % (ref 43.0–77.0)
Platelets: 231 10*3/uL (ref 150.0–400.0)
RBC: 4.41 Mil/uL (ref 3.87–5.11)
RDW: 13.3 % (ref 11.5–15.5)
WBC: 5.7 10*3/uL (ref 4.0–10.5)

## 2023-03-08 LAB — COMPREHENSIVE METABOLIC PANEL
ALT: 15 U/L (ref 0–35)
AST: 22 U/L (ref 0–37)
Albumin: 4.3 g/dL (ref 3.5–5.2)
Alkaline Phosphatase: 65 U/L (ref 39–117)
BUN: 25 mg/dL — ABNORMAL HIGH (ref 6–23)
CO2: 33 mEq/L — ABNORMAL HIGH (ref 19–32)
Calcium: 10 mg/dL (ref 8.4–10.5)
Chloride: 101 mEq/L (ref 96–112)
Creatinine, Ser: 1.17 mg/dL (ref 0.40–1.20)
GFR: 46.91 mL/min — ABNORMAL LOW (ref >60.00)
Glucose, Bld: 86 mg/dL (ref 70–99)
Potassium: 4.1 mEq/L (ref 3.5–5.1)
Sodium: 139 mEq/L (ref 135–145)
Total Bilirubin: 0.6 mg/dL (ref 0.2–1.2)
Total Protein: 7.9 g/dL (ref 6.0–8.3)

## 2023-03-08 LAB — HEMOGLOBIN A1C: Hgb A1c MFr Bld: 6.1 % (ref 4.6–6.5)

## 2023-03-08 LAB — TSH: TSH: 4.77 u[IU]/mL (ref 0.35–5.50)

## 2023-03-08 LAB — MICROALBUMIN / CREATININE URINE RATIO
Creatinine,U: 85 mg/dL
Microalb Creat Ratio: 4.7 mg/g (ref 0.0–30.0)
Microalb, Ur: 4 mg/dL — ABNORMAL HIGH (ref 0.0–1.9)

## 2023-03-08 MED ORDER — ATORVASTATIN CALCIUM 10 MG PO TABS: ORAL_TABLET | ORAL | 2 refills | Status: DC

## 2023-03-08 MED ORDER — TELMISARTAN 80 MG PO TABS: 80.0000 mg | ORAL_TABLET | Freq: Every day | ORAL | 1 refills | Status: DC

## 2023-03-08 MED ORDER — AMLODIPINE BESYLATE 5 MG PO TABS: ORAL_TABLET | ORAL | 2 refills | Status: DC

## 2023-03-08 NOTE — Assessment & Plan Note (Addendum)
Chronic Check lipid panel Discussed possibly increasing atorvastatin to 20 mg daily-she would like to hold off on that for now Continue atorvastatin 10 mg daily Regular exercise and healthy diet encouraged

## 2023-03-08 NOTE — Assessment & Plan Note (Signed)
Chronic   Lab Results  Component Value Date   HGBA1C 6.1 09/07/2022   Sugars controlled Check A1c, urine microalbumin today Continue lifestyle control Stressed regular exercise, diabetic diet

## 2023-03-08 NOTE — Assessment & Plan Note (Addendum)
Chronic BP not well controlled-better on repeat continue amlodipine 5 mg daily Stop losartan and start telmisartan 80 mg daily-hopefully this will help a little bit Monitor BP at home - goal is < 130/80 Stressed importance of blood pressure being well-controlled especially because of her decreased kidney function cmp

## 2023-03-08 NOTE — Telephone Encounter (Signed)
Pt is requesting a call from Dr. Quay Burow nurse, would not give an details about the subject matter, states it was a 'private matter'  Please call pt at: [phone]

## 2023-03-08 NOTE — Telephone Encounter (Signed)
Called pt no answer LMOM RTC.../lmb 

## 2023-03-08 NOTE — Assessment & Plan Note (Signed)
Chronic  Clinically euthyroid Check tsh and will titrate med dose if needed Currently taking levothyroxine 100 mcg daily 6 days a week, 50 mcg daily one day a week

## 2023-03-08 NOTE — Assessment & Plan Note (Signed)
Chronic Discussed decreased kidney function She has stopped Aleve and will only take Tylenol Increase water intake Continue low-sodium diet Stressed good BP, sugar control

## 2023-03-13 NOTE — Telephone Encounter (Signed)
Patient returned Gloria [name]'s call. She would like a call back at [phone].

## 2023-03-14 NOTE — Telephone Encounter (Signed)
LMOM for patient to return call to clinic. 

## 2023-03-15 MED ORDER — TRULANCE 3 MG PO TABS: 3.0000 mg | ORAL_TABLET | Freq: Every day | ORAL | 5 refills | Status: DC

## 2023-03-15 NOTE — Telephone Encounter (Signed)
I did send Trulance to her pharmacy.  This does require prior authorizations.  Most likely it may not be covered since we have not discussed this in detail when she has not tried other medications.

## 2023-03-15 NOTE — Telephone Encounter (Signed)
Left message for patient to return call to clinic.  If she calls back please let her know that script was sent in by more than likely not be covered.

## 2023-03-15 NOTE — Addendum Note (Signed)
Addended by: Binnie Rail on: 03/15/2023 12:10 PM   Modules accepted: Orders

## 2023-03-15 NOTE — Telephone Encounter (Signed)
Patient called please call her back

## 2023-03-16 ENCOUNTER — Ambulatory Visit (INDEPENDENT_AMBULATORY_CARE_PROVIDER_SITE_OTHER): Payer: Medicare HMO | Admitting: Orthopaedic Surgery

## 2023-03-16 DIAGNOSIS — M1711 Unilateral primary osteoarthritis, right knee: Secondary | ICD-10-CM

## 2023-03-16 DIAGNOSIS — M25461 Effusion, right knee: Secondary | ICD-10-CM | POA: Diagnosis not present

## 2023-03-16 NOTE — Progress Notes (Signed)
The patient comes in today with continued right knee pain and known severe osteoarthritis of her right knee.  She is 72 years old and well-known to me.  She has remote history of arthroscopic surgery to that knee.  She has well-documented severe arthritis.  She does have a steroid in that knee a few weeks ago.  She has also had hyaluronic acid and this is helping.  On today's visit she still has a moderate effusion of her right knee.  I was able to aspirate 30 to 40 cc of fluid from the knee.  At this point from an orthopedic standpoint the only other option is considering knee replacement surgery.  She still says she is not ready for that yet.  She will let us know if he gets to the point that she would like to proceed with knee replacement surgery because there is not really much else I have to offer.  Nothing seems to be helping at this point for her severe arthritis of her right knee.

## 2023-03-17 ENCOUNTER — Telehealth: Payer: Self-pay | Admitting: Orthopaedic Surgery

## 2023-03-17 NOTE — Telephone Encounter (Signed)
Patient states she was seen yesterday and is wanting to discuss the referral and the surgery that she was told about yesterday. Please advise

## 2023-03-23 ENCOUNTER — Telehealth: Payer: Self-pay | Admitting: Orthopaedic Surgery

## 2023-03-23 NOTE — Telephone Encounter (Signed)
Patient states she missed a call from [facility]

## 2023-03-27 NOTE — Telephone Encounter (Signed)
Truliance not covered by insurance.  Called and left message for patient. Patient has not tried  Linzess, [employer] Lubiprostone as an alternative yet.

## 2023-03-29 ENCOUNTER — Telehealth: Payer: Self-pay | Admitting: Internal Medicine

## 2023-03-29 ENCOUNTER — Other Ambulatory Visit: Payer: Self-pay

## 2023-03-29 DIAGNOSIS — I10 Essential (primary) hypertension: Secondary | ICD-10-CM

## 2023-03-29 MED ORDER — ADULT BLOOD PRESSURE CUFF LG KIT: 1.0000 | PACK | Freq: Every day | 0 refills | Status: DC

## 2023-03-29 NOTE — Telephone Encounter (Signed)
Sent today

## 2023-03-29 NOTE — Telephone Encounter (Signed)
Patient would like a BP monitor called in for her to [facility] - She does not want the wrist model.

## 2023-05-04 ENCOUNTER — Ambulatory Visit (INDEPENDENT_AMBULATORY_CARE_PROVIDER_SITE_OTHER): Payer: Medicare HMO | Admitting: Physician Assistant

## 2023-05-04 ENCOUNTER — Encounter: Payer: Self-pay | Admitting: Physician Assistant

## 2023-05-04 DIAGNOSIS — M170 Bilateral primary osteoarthritis of knee: Secondary | ICD-10-CM | POA: Diagnosis not present

## 2023-05-04 DIAGNOSIS — M25561 Pain in right knee: Secondary | ICD-10-CM

## 2023-05-04 DIAGNOSIS — M1711 Unilateral primary osteoarthritis, right knee: Secondary | ICD-10-CM

## 2023-05-04 DIAGNOSIS — M25562 Pain in left knee: Secondary | ICD-10-CM

## 2023-05-04 DIAGNOSIS — M1712 Unilateral primary osteoarthritis, left knee: Secondary | ICD-10-CM | POA: Diagnosis not present

## 2023-05-04 MED ORDER — LIDOCAINE HCL 1 % IJ SOLN
3.0000 mL | INTRAMUSCULAR | Status: AC | PRN
  Administered 2023-05-04: 3 mL

## 2023-05-04 MED ORDER — METHYLPREDNISOLONE ACETATE 40 MG/ML IJ SUSP
40.0000 mg | INTRAMUSCULAR | Status: AC | PRN
  Administered 2023-05-04: 40 mg via INTRA_ARTICULAR

## 2023-05-04 NOTE — Progress Notes (Signed)
   Procedure Note  Patient: Gloria [name]             Date of Birth: [birth year]           MRN: [id number]             Visit Date: 05/04/2023   HPI: Gloria [name] returns today for bilateral knee pain.  She is wanting cortisone injections in both knees.  She has known severe arthritis of her right knee.  She last had a cortisone injection 03/02/2023.  States knee is swollen since then.  She is also having left knee pain.  Prior x-rays left knee shows some moderate arthritis.  No known injury to either knee.  Increased pain both knees over the last week or so.  She is nondiabetic.  States her right knee is worse than the left.  Bilateral knees: Good range of motion of both knees.  Right knee with positive effusion no abnormal warmth erythema.  Left knee no abnormal warmth erythema or effusion.  Impression: Bilateral knee arthritis  Plan: Discussed with her that we cannot perform a right knee injection but we could aspirate the knee today.  We could go ahead with a left knee injection as she has not had 1 in the last 3 months.  She will follow-up with [place] as needed.  Questions were encouraged and answered at length.  She was wrapped with an Ace bandage on the right knee she will remove that before going to bed this evening. Procedures: Visit Diagnoses:  1. Unilateral primary osteoarthritis, left knee   2. Unilateral primary osteoarthritis, right knee     Large Joint Inj: bilateral knee on 05/04/2023 2:57 PM Indications: pain Details: 22 G 1.5 in needle, anterolateral approach  Arthrogram: No  Medications (Right): 3 mL lidocaine 1 % Aspirate (Right): 21 mL yellow and blood-tinged Medications (Left): 3 mL lidocaine 1 %; 40 mg methylPREDNISolone acetate 40 MG/ML Outcome: tolerated well, no immediate complications Procedure, treatment alternatives, risks and benefits explained, specific risks discussed. Consent was given by the patient. Immediately prior to procedure a time out was called to  verify the correct patient, procedure, equipment, support staff and site/side marked as required. Patient was prepped and draped in the usual sterile fashion.

## 2023-05-15 ENCOUNTER — Other Ambulatory Visit: Payer: Self-pay | Admitting: Internal Medicine

## 2023-05-15 DIAGNOSIS — Z0000 Encounter for general adult medical examination without abnormal findings: Secondary | ICD-10-CM

## 2023-05-17 ENCOUNTER — Ambulatory Visit
Admission: RE | Admit: 2023-05-17 | Discharge: 2023-05-17 | Disposition: A | Discharge status: Home / Self Care | Payer: Medicare HMO | Source: Ambulatory Visit | Attending: Internal Medicine | Admitting: Internal Medicine

## 2023-05-17 DIAGNOSIS — Z0000 Encounter for general adult medical examination without abnormal findings: Secondary | ICD-10-CM

## 2023-05-17 DIAGNOSIS — Z1231 Encounter for screening mammogram for malignant neoplasm of breast: Secondary | ICD-10-CM | POA: Diagnosis not present

## 2023-05-19 ENCOUNTER — Other Ambulatory Visit: Payer: Self-pay | Admitting: Internal Medicine

## 2023-05-19 DIAGNOSIS — R928 Other abnormal and inconclusive findings on diagnostic imaging of breast: Secondary | ICD-10-CM

## 2023-05-27 ENCOUNTER — Ambulatory Visit
Admission: RE | Admit: 2023-05-27 | Discharge: 2023-05-27 | Disposition: A | Discharge status: Home / Self Care | Payer: Medicare HMO | Source: Ambulatory Visit | Attending: Internal Medicine | Admitting: Internal Medicine

## 2023-05-27 ENCOUNTER — Ambulatory Visit: Payer: Medicare HMO

## 2023-05-27 DIAGNOSIS — R928 Other abnormal and inconclusive findings on diagnostic imaging of breast: Secondary | ICD-10-CM

## 2023-05-29 ENCOUNTER — Telehealth: Payer: Self-pay | Admitting: Orthopaedic Surgery

## 2023-05-29 NOTE — Telephone Encounter (Signed)
Spoke w/Sandy [phone] [facility], she stated she needs they need to know any loss of function or disability for this patient.  She stated that the clinicals are not clear.  Stated it can be uploaded to [id number].

## 2023-05-30 NOTE — Telephone Encounter (Signed)
Put this in a letter.

## 2023-05-30 NOTE — Telephone Encounter (Signed)
Are you able to advise on this since Bronson Curb is out?

## 2023-05-30 NOTE — Telephone Encounter (Signed)
Did sandy say what she needs this information for??

## 2023-06-01 ENCOUNTER — Other Ambulatory Visit: Payer: Self-pay | Admitting: Internal Medicine

## 2023-06-06 ENCOUNTER — Other Ambulatory Visit: Payer: Self-pay

## 2023-06-26 ENCOUNTER — Other Ambulatory Visit: Payer: Self-pay | Admitting: Internal Medicine

## 2023-07-03 ENCOUNTER — Encounter: Payer: Self-pay | Admitting: Family Medicine

## 2023-07-03 ENCOUNTER — Ambulatory Visit (INDEPENDENT_AMBULATORY_CARE_PROVIDER_SITE_OTHER): Payer: Medicare HMO | Admitting: Family Medicine

## 2023-07-03 VITALS — Resp 20 | Ht 66.0 in | Wt 156.0 lb

## 2023-07-03 DIAGNOSIS — X158XXA Contact with other hot household appliances, initial encounter: Secondary | ICD-10-CM

## 2023-07-03 DIAGNOSIS — T24212A Burn of second degree of left thigh, initial encounter: Secondary | ICD-10-CM

## 2023-07-03 DIAGNOSIS — N3941 Urge incontinence: Secondary | ICD-10-CM

## 2023-07-03 DIAGNOSIS — R351 Nocturia: Secondary | ICD-10-CM

## 2023-07-03 DIAGNOSIS — N3281 Overactive bladder: Secondary | ICD-10-CM

## 2023-07-03 LAB — POCT URINALYSIS DIPSTICK
Bilirubin, UA: NEGATIVE
Blood, UA: NEGATIVE
Glucose, UA: NEGATIVE
Ketones, UA: NEGATIVE
Nitrite, UA: NEGATIVE
Protein, UA: POSITIVE — AB
Spec Grav, UA: 1.01 (ref 1.010–1.025)
Urobilinogen, UA: NEGATIVE E.U./dL — AB
pH, UA: 7 (ref 5.0–8.0)

## 2023-07-03 MED ORDER — MUPIROCIN 2 % EX OINT
TOPICAL_OINTMENT | CUTANEOUS | 0 refills | Status: DC
Start: 2023-07-03 — End: 2024-01-08
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
## 2023-07-04 ENCOUNTER — Other Ambulatory Visit: Payer: Self-pay | Admitting: Internal Medicine

## 2023-07-04 DIAGNOSIS — N3941 Urge incontinence: Secondary | ICD-10-CM

## 2023-07-04 NOTE — Progress Notes (Signed)
Patient did not want to sign consent form today. She wants to speak with surgeon prior  COVID Vaccine Completed: yes  Date of COVID positive in last 90 days: no  PCP - Cheryll Cockayne, MD Cardiologist - n/a Neurologist- Dr. Alphonzo Dublin  Chest x-ray - n/a EKG - 07/05/23 Epic/chart Stress Test - n/a ECHO - n/a Cardiac Cath - n/a Pacemaker/ICD device last checked: n/a Spinal Cord Stimulator: n/a  Bowel Prep - no  Sleep Study - n/a CPAP -   Fasting Blood Sugar - pre pt used to, no checks or meds at home Checks Blood Sugar   Last dose of GLP1 agonist-  N/A GLP1 instructions:  N/A   Last dose of SGLT-2 inhibitors-  N/A SGLT-2 instructions: N/A   Blood Thinner Instructions:  Time Aspirin Instructions:ASA 81, hold 7 days Last Dose:  Activity level: Can go up a flight of stairs and perform activities of daily living without stopping and without symptoms of chest pain or shortness of breath.   Anesthesia review: HTN coronary atherosclerosis, DM2, CKD, mysathenia gravis last flare up over a year, HR 46  Patient denies shortness of breath, fever, cough and chest pain at PAT appointment  Patient verbalized understanding of instructions that were given to them at the PAT appointment. Patient was also instructed that they will need to review over the PAT instructions again at home before surgery.

## 2023-07-05 ENCOUNTER — Other Ambulatory Visit: Payer: Self-pay

## 2023-07-05 ENCOUNTER — Encounter (HOSPITAL_COMMUNITY)
Admission: RE | Admit: 2023-07-05 | Discharge: 2023-07-05 | Disposition: A | Discharge status: Home / Self Care | Payer: Medicare HMO | Source: Ambulatory Visit | Attending: Orthopaedic Surgery | Admitting: Orthopaedic Surgery

## 2023-07-05 ENCOUNTER — Encounter (HOSPITAL_COMMUNITY): Payer: Self-pay

## 2023-07-05 VITALS — BP 152/85 | HR 46 | Temp 98.4°F | Resp 16 | Ht 66.0 in | Wt 156.0 lb

## 2023-07-05 DIAGNOSIS — M1711 Unilateral primary osteoarthritis, right knee: Secondary | ICD-10-CM | POA: Diagnosis not present

## 2023-07-05 DIAGNOSIS — Z01818 Encounter for other preprocedural examination: Secondary | ICD-10-CM | POA: Diagnosis not present

## 2023-07-05 DIAGNOSIS — E119 Type 2 diabetes mellitus without complications: Secondary | ICD-10-CM

## 2023-07-05 HISTORY — DX: Unspecified osteoarthritis, unspecified site: M19.90

## 2023-07-05 LAB — COMPREHENSIVE METABOLIC PANEL
ALT: 17 U/L (ref 0–44)
AST: 21 U/L (ref 15–41)
Albumin: 3.7 g/dL (ref 3.5–5.0)
Alkaline Phosphatase: 60 U/L (ref 38–126)
Anion gap: 7 (ref 5–15)
BUN: 15 mg/dL (ref 8–23)
CO2: 27 mmol/L (ref 22–32)
Calcium: 9.1 mg/dL (ref 8.9–10.3)
Chloride: 103 mmol/L (ref 98–111)
Creatinine, Ser: 1.05 mg/dL — ABNORMAL HIGH (ref 0.44–1.00)
GFR, Estimated: 57 mL/min — ABNORMAL LOW (ref >60)
Glucose, Bld: 99 mg/dL (ref 70–99)
Potassium: 3.8 mmol/L (ref 3.5–5.1)
Sodium: 137 mmol/L (ref 135–145)
Total Bilirubin: 0.6 mg/dL (ref 0.3–1.2)
Total Protein: 7.4 g/dL (ref 6.5–8.1)

## 2023-07-05 LAB — CBC
HCT: 36.6 % (ref 36.0–46.0)
Hemoglobin: 12.2 g/dL (ref 12.0–15.0)
MCH: 31 pg (ref 26.0–34.0)
MCHC: 33.3 g/dL (ref 30.0–36.0)
MCV: 92.9 fL (ref 80.0–100.0)
Platelets: 200 10*3/uL (ref 150–400)
RBC: 3.94 MIL/uL (ref 3.87–5.11)
RDW: 13.1 % (ref 11.5–15.5)
WBC: 4.8 10*3/uL (ref 4.0–10.5)
nRBC: 0 % (ref 0.0–0.2)

## 2023-07-05 LAB — HEMOGLOBIN A1C
Hgb A1c MFr Bld: 6.1 % — ABNORMAL HIGH (ref 4.8–5.6)
Mean Plasma Glucose: 128.37 mg/dL

## 2023-07-05 LAB — SURGICAL PCR SCREEN
MRSA, PCR: NEGATIVE
Staphylococcus aureus: NEGATIVE

## 2023-07-05 LAB — URINE CULTURE

## 2023-07-05 LAB — GLUCOSE, CAPILLARY: Glucose-Capillary: 78 mg/dL (ref 70–99)

## 2023-07-05 NOTE — Patient Instructions (Signed)
SURGICAL WAITING ROOM VISITATION  Patients having surgery or a procedure may have no more than 2 support people in the waiting area - these visitors may rotate.    Children under the age of 32 must have an adult with them who is not the patient.  Due to an increase in RSV and influenza rates and associated hospitalizations, children ages 24 and under may not visit patients in [facility] hospitals.  If the patient needs to stay at the hospital during part of their recovery, the visitor guidelines for inpatient rooms apply. Pre-op nurse will coordinate an appropriate time for 1 support person to accompany patient in pre-op.  This support person may not rotate.    Please refer to the [facility] website for the visitor guidelines for Inpatients (after your surgery is over and you are in a regular room).    Your procedure is scheduled on: 07/14/23   Report to [facility] Main Entrance    Report to admitting at 10:30 AM   Call this number if you have problems the morning of surgery [phone]   Do not eat food :After Midnight.   After Midnight you may have the following liquids until 10:00 AM DAY OF SURGERY  Water Non-Citrus Juices (without pulp, NO RED-Apple, White grape, White cranberry) Black Coffee (NO MILK/CREAM OR CREAMERS, sugar ok)  Clear Tea (NO MILK/CREAM OR CREAMERS, sugar ok) regular and decaf                             Plain Jell-O (NO RED)                                           Fruit ices (not with fruit pulp, NO RED)                                     Popsicles (NO RED)                                                               Sports drinks like Gatorade (NO RED)                 The day of surgery:  Drink ONE (1) Pre-Surgery G2 at 10:00 AM the morning of surgery. Drink in one sitting. Do not sip.  This drink was given to you during your hospital  pre-op appointment visit. Nothing else to drink after completing the  Pre-Surgery G2.          If you  have questions, please contact your surgeon's office.   FOLLOW BOWEL PREP AND ANY ADDITIONAL PRE OP INSTRUCTIONS YOU RECEIVED FROM YOUR SURGEON'S OFFICE!!!     Oral Hygiene is also important to reduce your risk of infection.                                    Remember - BRUSH YOUR TEETH THE MORNING OF SURGERY WITH YOUR REGULAR TOOTHPASTE  DENTURES WILL BE REMOVED  PRIOR TO SURGERY PLEASE DO NOT APPLY "Poly grip" OR ADHESIVES!!!   Take these medicines the morning of surgery with A SIP OF WATER: Tylenol, Amlodipine, Levothyroxine   How to Manage Your Diabetes Before and After Surgery  Why is it important to control my blood sugar before and after surgery? Improving blood sugar levels before and after surgery helps healing and can limit problems. A way of improving blood sugar control is eating a healthy diet by:  Eating less sugar and carbohydrates  Increasing activity/exercise  Talking with your doctor about reaching your blood sugar goals High blood sugars (greater than 180 mg/dL) can raise your risk of infections and slow your recovery, so you will need to focus on controlling your diabetes during the weeks before surgery. Make sure that the doctor who takes care of your diabetes knows about your planned surgery including the date and location.  How do I manage my blood sugar before surgery? Check your blood sugar at least 4 times a day, starting 2 days before surgery, to make sure that the level is not too high or low. Check your blood sugar the morning of your surgery when you wake up and every 2 hours until you get to the Short Stay unit. If your blood sugar is less than 70 mg/dL, you will need to treat for low blood sugar: Do not take insulin. Treat a low blood sugar (less than 70 mg/dL) with  cup of clear juice (cranberry or apple), 4 glucose tablets, OR glucose gel. Recheck blood sugar in 15 minutes after treatment (to make sure it is greater than 70 mg/dL). If your blood sugar  is not greater than 70 mg/dL on recheck, call [phone] for further instructions. Report your blood sugar to the short stay nurse when you get to Short Stay.  If you are admitted to the hospital after surgery: Your blood sugar will be checked by the staff and you will probably be given insulin after surgery (instead of oral diabetes medicines) to make sure you have good blood sugar levels. The goal for blood sugar control after surgery is 80-180 mg/dL.  Reviewed and Endorsed by [facility] Patient Education Committee, August 2015                              You may not have any metal on your body including hair pins, jewelry, and body piercing             Do not wear make-up, lotions, powders, perfumes, or deodorant  Do not wear nail polish including gel and S&S, artificial/acrylic nails, or any other type of covering on natural nails including finger and toenails. If you have artificial nails, gel coating, etc. that needs to be removed by a nail salon please have this removed prior to surgery or surgery may need to be canceled/ delayed if the surgeon/ anesthesia feels like they are unable to be safely monitored.   Do not shave  48 hours prior to surgery.    Do not bring valuables to the hospital. [facility] IS NOT             RESPONSIBLE   FOR VALUABLES.   Contacts, glasses, dentures or bridgework may not be worn into surgery.   Bring small overnight bag day of surgery.   DO NOT BRING YOUR HOME MEDICATIONS TO THE HOSPITAL. PHARMACY WILL DISPENSE MEDICATIONS LISTED ON YOUR MEDICATION LIST TO YOU DURING YOUR  ADMISSION IN THE HOSPITAL!              Please read over the following fact sheets you were given: IF YOU HAVE QUESTIONS ABOUT YOUR PRE-OP INSTRUCTIONS PLEASE CALL [phone]    If you received a COVID test during your pre-op visit  it is requested that you wear a mask when out in public, stay away from anyone that may not be feeling well and notify your surgeon if you  develop symptoms. If you test positive for Covid or have been in contact with anyone that has tested positive in the last 10 days please notify you surgeon.      Pre-operative 5 CHG Bath Instructions   You can play a key role in reducing the risk of infection after surgery. Your skin needs to be as free of germs as possible. You can reduce the number of germs on your skin by washing with CHG (chlorhexidine gluconate) soap before surgery. CHG is an antiseptic soap that kills germs and continues to kill germs even after washing.   DO NOT use if you have an allergy to chlorhexidine/CHG or antibacterial soaps. If your skin becomes reddened or irritated, stop using the CHG and notify one of our RNs at [phone].   Please shower with the CHG soap starting 4 days before surgery using the following schedule:     Please keep in mind the following:  DO NOT shave, including legs and underarms, starting the day of your first shower.   You may shave your face at any point before/day of surgery.  Place clean sheets on your bed the day you start using CHG soap. Use a clean washcloth (not used since being washed) for each shower. DO NOT sleep with pets once you start using the CHG.   CHG Shower Instructions:  If you choose to wash your hair and private area, wash first with your normal shampoo/soap.  After you use shampoo/soap, rinse your hair and body thoroughly to remove shampoo/soap residue.  Turn the water OFF and apply about 3 tablespoons (45 ml) of CHG soap to a CLEAN washcloth.  Apply CHG soap ONLY FROM YOUR NECK DOWN TO YOUR TOES (washing for 3-5 minutes)  DO NOT use CHG soap on face, private areas, open wounds, or sores.  Pay special attention to the area where your surgery is being performed.  If you are having back surgery, having someone wash your back for you may be helpful. Wait 2 minutes after CHG soap is applied, then you may rinse off the CHG soap.  Pat dry with a clean towel  Put  on clean clothes/pajamas   If you choose to wear lotion, please use ONLY the CHG-compatible lotions on the back of this paper.     Additional instructions for the day of surgery: DO NOT APPLY any lotions, deodorants, cologne, or perfumes.   Put on clean/comfortable clothes.  Brush your teeth.  Ask your nurse before applying any prescription medications to the skin.      CHG Compatible Lotions   Aveeno Moisturizing lotion  Cetaphil Moisturizing Cream  Cetaphil Moisturizing Lotion  Clairol Herbal Essence Moisturizing Lotion, Dry Skin  Clairol Herbal Essence Moisturizing Lotion, Extra Dry Skin  Clairol Herbal Essence Moisturizing Lotion, Normal Skin  Curel Age Defying Therapeutic Moisturizing Lotion with Alpha Hydroxy  Curel Extreme Care Body Lotion  Curel Soothing Hands Moisturizing Hand Lotion  Curel Therapeutic Moisturizing Cream, Fragrance-Free  Curel Therapeutic Moisturizing Lotion, Fragrance-Free  Curel Therapeutic Moisturizing  Lotion, Original Formula  Eucerin Daily Replenishing Lotion  Eucerin Dry Skin Therapy Plus Alpha Hydroxy Crme  Eucerin Dry Skin Therapy Plus Alpha Hydroxy Lotion  Eucerin Original Crme  Eucerin Original Lotion  Eucerin Plus Crme Eucerin Plus Lotion  Eucerin TriLipid Replenishing Lotion  Keri Anti-Bacterial Hand Lotion  Keri Deep Conditioning Original Lotion Dry Skin Formula Softly Scented  Keri Deep Conditioning Original Lotion, Fragrance Free Sensitive Skin Formula  Keri Lotion Fast Absorbing Fragrance Free Sensitive Skin Formula  Keri Lotion Fast Absorbing Softly Scented Dry Skin Formula  Keri Original Lotion  Keri Skin Renewal Lotion Keri Silky Smooth Lotion  Keri Silky Smooth Sensitive Skin Lotion  Nivea Body Creamy Conditioning Oil  Nivea Body Extra Enriched Lotion  Nivea Body Original Lotion  Nivea Body Sheer Moisturizing Lotion Nivea Crme  Nivea Skin Firming Lotion  NutraDerm 30 Skin Lotion  NutraDerm Skin Lotion  NutraDerm  Therapeutic Skin Cream  NutraDerm Therapeutic Skin Lotion  ProShield Protective Hand Cream  Provon moisturizing lotion   Incentive Spirometer  An incentive spirometer is a tool that can help keep your lungs clear and active. This tool measures how well you are filling your lungs with each breath. Taking long deep breaths may help reverse or decrease the chance of developing breathing (pulmonary) problems (especially infection) following: A long period of time when you are unable to move or be active. BEFORE THE PROCEDURE  If the spirometer includes an indicator to show your best effort, your nurse or respiratory therapist will set it to a desired goal. If possible, sit up straight or lean slightly forward. Try not to slouch. Hold the incentive spirometer in an upright position. INSTRUCTIONS FOR USE  Sit on the edge of your bed if possible, or sit up as far as you can in bed or on a chair. Hold the incentive spirometer in an upright position. Breathe out normally. Place the mouthpiece in your mouth and seal your lips tightly around it. Breathe in slowly and as deeply as possible, raising the piston or the ball toward the top of the column. Hold your breath for 3-5 seconds or for as long as possible. Allow the piston or ball to fall to the bottom of the column. Remove the mouthpiece from your mouth and breathe out normally. Rest for a few seconds and repeat Steps 1 through 7 at least 10 times every 1-2 hours when you are awake. Take your time and take a few normal breaths between deep breaths. The spirometer may include an indicator to show your best effort. Use the indicator as a goal to work toward during each repetition. After each set of 10 deep breaths, practice coughing to be sure your lungs are clear. If you have an incision (the cut made at the time of surgery), support your incision when coughing by placing a pillow or rolled up towels firmly against it. Once you are able to get out of  bed, walk around indoors and cough well. You may stop using the incentive spirometer when instructed by your caregiver.  RISKS AND COMPLICATIONS Take your time so you do not get dizzy or light-headed. If you are in pain, you may need to take or ask for pain medication before doing incentive spirometry. It is harder to take a deep breath if you are having pain. AFTER USE Rest and breathe slowly and easily. It can be helpful to keep track of a log of your progress. Your caregiver can provide you with a simple table to  help with this. If you are using the spirometer at home, follow these instructions: SEEK MEDICAL CARE IF:  You are having difficultly using the spirometer. You have trouble using the spirometer as often as instructed. Your pain medication is not giving enough relief while using the spirometer. You develop fever of 100.5 F (38.1 C) or higher. SEEK IMMEDIATE MEDICAL CARE IF:  You cough up bloody sputum that had not been present before. You develop fever of 102 F (38.9 C) or greater. You develop worsening pain at or near the incision site. MAKE SURE YOU:  Understand these instructions. Will watch your condition. Will get help right away if you are not doing well or get worse. Document Released: 04/10/2007 Document Revised: 02/20/2012 Document Reviewed: 06/11/2007 [facility] Patient Information 2014 [place], [place].   ________________________________________________________________________

## 2023-07-06 ENCOUNTER — Encounter: Payer: Medicare HMO | Admitting: Orthopaedic Surgery

## 2023-07-06 MED ORDER — MIRABEGRON ER 25 MG PO TB24
25.0000 mg | ORAL_TABLET | Freq: Every day | ORAL | 0 refills | Status: DC
Start: 2023-07-06 — End: 2023-08-10

## 2023-07-06 NOTE — Addendum Note (Signed)
Addended by: Gwenlyn Fudge on: 07/06/2023 03:19 PM   Modules accepted: Orders

## 2023-07-07 ENCOUNTER — Other Ambulatory Visit (HOSPITAL_COMMUNITY): Payer: Medicare HMO

## 2023-07-12 ENCOUNTER — Telehealth: Payer: Self-pay

## 2023-07-12 NOTE — Telephone Encounter (Signed)
Left VM of the below message for patient

## 2023-07-12 NOTE — Telephone Encounter (Signed)
Pt called [phone] and states that her PCP started her on a new medication for overactive bladder.( Myrbetriq)  She is sch for surgery on Friday and did not know if this would be ok to take the morning of her surgery. Asking for a call back to advise.

## 2023-07-13 ENCOUNTER — Telehealth: Payer: Self-pay | Admitting: *Deleted

## 2023-07-13 NOTE — Telephone Encounter (Signed)
[facility] pre-op call attempted; no answer and left VM requesting call back. 

## 2023-07-13 NOTE — H&P (Signed)
TOTAL KNEE ADMISSION H&P  Patient is being admitted for right total knee arthroplasty.  Subjective:  Chief Complaint:right knee pain.  HPI: Gloria [name], 72 y.o. female, has a history of pain and functional disability in the right knee due to arthritis and has failed non-surgical conservative treatments for greater than 12 weeks to includeNSAID's and/or analgesics, corticosteriod injections, viscosupplementation injections, flexibility and strengthening excercises, use of assistive devices, and activity modification.  Onset of symptoms was gradual, starting 3 years ago with gradually worsening course since that time. The patient noted no past surgery on the right knee(s).  Patient currently rates pain in the right knee(s) at 10 out of 10 with activity. Patient has night pain, worsening of pain with activity and weight bearing, pain that interferes with activities of daily living, pain with passive range of motion, crepitus, and joint swelling.  Patient has evidence of subchondral sclerosis, periarticular osteophytes, and joint space narrowing by imaging studies. There is no active infection.  Patient Active Problem List   Diagnosis Date Noted   CKD (chronic kidney disease) stage 3, GFR 30-59 ml/min (HCC) 03/08/2023   Arthropathy of lumbar facet joint 11/14/2022   Unilateral primary osteoarthritis, right knee 09/26/2022   Head injury 04/15/2022   Bilateral low back pain without sciatica 09/29/2021   Bradycardia 08/20/2020   Acute cervical radiculopathy 07/09/2020   Pruritus 02/06/2020   Raynaud's phenomenon 01/06/2020   Left wrist tendinitis 04/30/2018   Hand numbness 01/24/2018   Lumbar radiculopathy 12/23/2016   Plantar fasciitis of right foot 06/22/2016   Carpal tunnel syndrome of right wrist 03/07/2013   Myasthenia gravis without exacerbation (HCC) 12/31/2010   Alopecia 12/31/2010   Hypothyroidism 12/29/2010   Type 2 diabetes, diet controlled (HCC) 12/29/2010   Hyperlipidemia  12/29/2010   Essential hypertension 12/29/2010   Coronary atherosclerosis 12/29/2010   Osteoarthritis 12/29/2010   Past Medical History:  Diagnosis Date   Alopecia areata 10/2010 onset   Arthritis    Diabetes mellitus, type 2 (HCC)    no longer per pt   Dyslipidemia    Hyperlipidemia    on meds   Hypertension    on meds   Hypothyroid    on meds   Myasthenia gravis     Past Surgical History:  Procedure Laterality Date   ABDOMINAL HYSTERECTOMY  12/12/1988   CARPAL TUNNEL RELEASE Left    CARPAL TUNNEL RELEASE Right    COLONOSCOPY  2008   [facility]    No current facility-administered medications for this encounter.   Current Outpatient Medications  Medication Sig Dispense Refill Last Dose   acetaminophen (TYLENOL) 500 MG tablet Take 500 mg by mouth every 6 (six) hours as needed for moderate pain or mild pain.      amLODipine (NORVASC) 5 MG tablet TAKE 1 TABLET(5 MG) BY MOUTH DAILY 90 tablet 2    Ascorbic Acid (VITAMIN C PO) Take 1,000 mg by mouth daily at 6 (six) AM.      aspirin EC 81 MG tablet Take 81 mg by mouth daily.      atorvastatin (LIPITOR) 10 MG tablet TAKE 1 TABLET BY MOUTH DAILY AT 6 PM 90 tablet 2    BIOTIN 5000 PO Take 10,000 mcg by mouth daily. Hair, skin, Nails      CALCIUM-MAGNESIUM-ZINC PO Take 1 tablet by mouth daily at 6 (six) AM.      Cholecalciferol (VITAMIN D) 50 MCG (2000 UT) CAPS Take 2,000 Units by mouth daily.      Cyanocobalamin (  VITAMIN B 12 PO) Take 3,000 mcg by mouth daily at 6 (six) AM.      diclofenac Sodium (VOLTAREN) 1 % GEL Apply 4 g topically 4 (four) times daily. (Patient taking differently: Apply 4 g topically daily as needed (pain).) 100 g 5    Ferrous Sulfate Dried (SLOW RELEASE IRON) 45 MG TBCR Take 1 tablet by mouth daily.      levothyroxine (SYNTHROID) 100 MCG tablet TAKE 1 TABLET BY MOUTH DAILY 30 MINUTES PRIOR TO BREAKFAST 6 DAYS A WEEK AND 1/2 TABLET 1 DAY A WEEK 90 tablet 0    Multiple Vitamins-Minerals (WOMENS 50+ MULTI VITAMIN  PO) Take 1 tablet by mouth daily at 6 (six) AM.      mupirocin ointment (BACTROBAN) 2 % Apply to affected area daily and as needed. 30 g 0    Omega-3 1000 MG CAPS Take 1,000 mg by mouth daily.      Polyethyl Glycol-Propyl Glycol (SYSTANE) 0.4-0.3 % SOLN Place 1 drop into both eyes daily as needed (dry eyes).      telmisartan (MICARDIS) 80 MG tablet Take 1 tablet (80 mg total) by mouth daily. 90 tablet 1    triamcinolone cream (KENALOG) 0.1 % APPLY TOPICALLY TO THE AFFECTED AREA TWICE DAILY (Patient taking differently: Apply 1 Application topically daily as needed (Irritation).) 30 g 0    Blood Pressure Monitor DEVI Use to check blood pressure once daily 1 each 0    Blood Pressure Monitoring (ADULT BLOOD PRESSURE CUFF LG) KIT 1 kit by Does not apply route daily. 1 kit 0    clobetasol (TEMOVATE) 0.05 % external solution APPLY TOPICALLY TO THE AFFECTED AREA EVERY DAY FOR 6 WEEKS (Patient not taking: Reported on 07/03/2023) 50 mL 2 Not Taking   mirabegron ER (MYRBETRIQ) 25 MG TB24 tablet Take 1 tablet (25 mg total) by mouth daily. 30 tablet 0    Allergies  Allergen Reactions   Acetaminophen    Beta Adrenergic Blockers Other (See Comments)   Clindamycin Other (See Comments)    Unknown   Hydrocodone    Hydrocodone-Acetaminophen Swelling    Facial swelling   Lincomycin Other (See Comments)   Lithium Other (See Comments)    Unknown   Morphine Other (See Comments)    Unknown   Neomycin Other (See Comments)    Unknown   Quinidine Other (See Comments)   Quinine Other (See Comments)    Unknown   Timolol Other (See Comments)   Vicodin [Hydrocodone-Acetaminophen] Swelling    Facial swelling    Social History   Tobacco Use   Smoking status: Never   Smokeless tobacco: Never  Substance Use Topics   Alcohol use: No    Family History  Problem Relation Age of Onset   Colon cancer Neg Hx    Colon polyps Neg Hx    Esophageal cancer Neg Hx    Rectal cancer Neg Hx    Stomach cancer Neg Hx       Review of Systems  Objective:  Physical Exam Vitals reviewed.  Constitutional:      Appearance: Normal appearance. She is normal weight.  HENT:     Head: Normocephalic and atraumatic.  Eyes:     Extraocular Movements: Extraocular movements intact.  Cardiovascular:     Rate and Rhythm: Normal rate.     Pulses: Normal pulses.  Pulmonary:     Effort: Pulmonary effort is normal.     Breath sounds: Normal breath sounds.  Abdominal:  Palpations: Abdomen is soft.  Musculoskeletal:     Cervical back: Normal range of motion and neck supple.     Right knee: Effusion, bony tenderness and crepitus present. Decreased range of motion. Tenderness present over the medial joint line and lateral joint line. Abnormal alignment.  Neurological:     Mental Status: She is alert and oriented to person, place, and time.  Psychiatric:        Behavior: Behavior normal.     Vital signs in last 24 hours:    Labs:   Estimated body mass index is 25.18 kg/m as calculated from the following:   Height as of 07/05/23: 5\' 6"  (1.676 m).   Weight as of 07/05/23: 70.8 kg.   Imaging Review Plain radiographs demonstrate severe degenerative joint disease of the right knee(s). The overall alignment ismild varus. The bone quality appears to be good for age and reported activity level.      Assessment/Plan:  End stage arthritis, right knee   The patient history, physical examination, clinical judgment of the provider and imaging studies are consistent with end stage degenerative joint disease of the right knee(s) and total knee arthroplasty is deemed medically necessary. The treatment options including medical management, injection therapy arthroscopy and arthroplasty were discussed at length. The risks and benefits of total knee arthroplasty were presented and reviewed. The risks due to aseptic loosening, infection, stiffness, patella tracking problems, thromboembolic complications and other  imponderables were discussed. The patient acknowledged the explanation, agreed to proceed with the plan and consent was signed. Patient is being admitted for inpatient treatment for surgery, pain control, PT, OT, prophylactic antibiotics, VTE prophylaxis, progressive ambulation and ADL's and discharge planning. The patient is planning to be discharged home with home health services

## 2023-07-14 ENCOUNTER — Encounter (HOSPITAL_COMMUNITY)
Admission: RE | Disposition: A | Discharge status: Home Health | Payer: Self-pay | Source: Home / Self Care | Attending: Orthopaedic Surgery

## 2023-07-14 ENCOUNTER — Encounter (HOSPITAL_COMMUNITY): Payer: Self-pay | Admitting: Orthopaedic Surgery

## 2023-07-14 ENCOUNTER — Other Ambulatory Visit: Payer: Self-pay

## 2023-07-14 ENCOUNTER — Ambulatory Visit (HOSPITAL_BASED_OUTPATIENT_CLINIC_OR_DEPARTMENT_OTHER): Payer: Medicare HMO | Admitting: Anesthesiology

## 2023-07-14 ENCOUNTER — Inpatient Hospital Stay (HOSPITAL_COMMUNITY)
Admission: RE | Admit: 2023-07-14 | Discharge: 2023-07-18 | DRG: 470 | Disposition: A | Discharge status: Home Health | Payer: Medicare HMO | Attending: Orthopaedic Surgery | Admitting: Orthopaedic Surgery

## 2023-07-14 ENCOUNTER — Ambulatory Visit (HOSPITAL_COMMUNITY): Payer: Medicare HMO | Admitting: Physician Assistant

## 2023-07-14 ENCOUNTER — Observation Stay (HOSPITAL_COMMUNITY): Payer: Medicare HMO

## 2023-07-14 ENCOUNTER — Telehealth: Payer: Self-pay | Admitting: *Deleted

## 2023-07-14 DIAGNOSIS — Z79899 Other long term (current) drug therapy: Secondary | ICD-10-CM

## 2023-07-14 DIAGNOSIS — E1122 Type 2 diabetes mellitus with diabetic chronic kidney disease: Secondary | ICD-10-CM

## 2023-07-14 DIAGNOSIS — Z7982 Long term (current) use of aspirin: Secondary | ICD-10-CM | POA: Diagnosis not present

## 2023-07-14 DIAGNOSIS — N183 Chronic kidney disease, stage 3 unspecified: Secondary | ICD-10-CM

## 2023-07-14 DIAGNOSIS — Z888 Allergy status to other drugs, medicaments and biological substances status: Secondary | ICD-10-CM

## 2023-07-14 DIAGNOSIS — Z886 Allergy status to analgesic agent status: Secondary | ICD-10-CM

## 2023-07-14 DIAGNOSIS — E119 Type 2 diabetes mellitus without complications: Secondary | ICD-10-CM

## 2023-07-14 DIAGNOSIS — Z96651 Presence of right artificial knee joint: Secondary | ICD-10-CM | POA: Diagnosis not present

## 2023-07-14 DIAGNOSIS — I7300 Raynaud's syndrome without gangrene: Secondary | ICD-10-CM | POA: Diagnosis present

## 2023-07-14 DIAGNOSIS — Z90710 Acquired absence of both cervix and uterus: Secondary | ICD-10-CM | POA: Diagnosis not present

## 2023-07-14 DIAGNOSIS — E039 Hypothyroidism, unspecified: Secondary | ICD-10-CM | POA: Diagnosis present

## 2023-07-14 DIAGNOSIS — I129 Hypertensive chronic kidney disease with stage 1 through stage 4 chronic kidney disease, or unspecified chronic kidney disease: Secondary | ICD-10-CM | POA: Diagnosis present

## 2023-07-14 DIAGNOSIS — Z79890 Hormone replacement therapy (postmenopausal): Secondary | ICD-10-CM

## 2023-07-14 DIAGNOSIS — G7000 Myasthenia gravis without (acute) exacerbation: Secondary | ICD-10-CM | POA: Diagnosis present

## 2023-07-14 DIAGNOSIS — Z881 Allergy status to other antibiotic agents status: Secondary | ICD-10-CM | POA: Diagnosis not present

## 2023-07-14 DIAGNOSIS — M1711 Unilateral primary osteoarthritis, right knee: Principal | ICD-10-CM | POA: Diagnosis present

## 2023-07-14 DIAGNOSIS — R339 Retention of urine, unspecified: Secondary | ICD-10-CM | POA: Diagnosis not present

## 2023-07-14 DIAGNOSIS — R609 Edema, unspecified: Secondary | ICD-10-CM | POA: Diagnosis not present

## 2023-07-14 DIAGNOSIS — I2510 Atherosclerotic heart disease of native coronary artery without angina pectoris: Secondary | ICD-10-CM | POA: Diagnosis present

## 2023-07-14 DIAGNOSIS — E785 Hyperlipidemia, unspecified: Secondary | ICD-10-CM | POA: Diagnosis present

## 2023-07-14 DIAGNOSIS — G8918 Other acute postprocedural pain: Secondary | ICD-10-CM | POA: Diagnosis not present

## 2023-07-14 HISTORY — PX: TOTAL KNEE ARTHROPLASTY: SHX125

## 2023-07-14 LAB — GLUCOSE, CAPILLARY
Glucose-Capillary: 81 mg/dL (ref 70–99)
Glucose-Capillary: 68 mg/dL — ABNORMAL LOW (ref 70–99)

## 2023-07-14 SURGERY — ARTHROPLASTY, KNEE, TOTAL
Anesthesia: Spinal | Site: Knee | Laterality: Right

## 2023-07-14 MED ORDER — METOCLOPRAMIDE HCL 5 MG PO TABS: 5.0000 mg | ORAL_TABLET | Freq: Three times a day (TID) | ORAL | Status: DC | PRN

## 2023-07-14 MED ORDER — ONDANSETRON HCL 4 MG/2ML IJ SOLN
INTRAMUSCULAR | Status: AC
  Filled 2023-07-14: qty 2

## 2023-07-14 MED ORDER — METHOCARBAMOL 500 MG PO TABS
500.0000 mg | ORAL_TABLET | Freq: Four times a day (QID) | ORAL | Status: DC | PRN
  Administered 2023-07-14 – 2023-07-18 (×10): 500 mg via ORAL
  Filled 2023-07-14 (×10): qty 1

## 2023-07-14 MED ORDER — EPHEDRINE 5 MG/ML INJ
INTRAVENOUS | Status: AC
  Filled 2023-07-14: qty 5

## 2023-07-14 MED ORDER — TRANEXAMIC ACID-NACL 1000-0.7 MG/100ML-% IV SOLN
1000.0000 mg | INTRAVENOUS | Status: AC
  Administered 2023-07-14: 1000 mg via INTRAVENOUS
  Filled 2023-07-14: qty 100

## 2023-07-14 MED ORDER — CEFAZOLIN SODIUM-DEXTROSE 1-4 GM/50ML-% IV SOLN
1.0000 g | Freq: Four times a day (QID) | INTRAVENOUS | Status: AC
  Administered 2023-07-14 – 2023-07-15 (×2): 1 g via INTRAVENOUS
  Filled 2023-07-14 (×2): qty 50

## 2023-07-14 MED ORDER — PROMETHAZINE HCL 25 MG/ML IJ SOLN: 6.2500 mg | INTRAMUSCULAR | Status: DC | PRN

## 2023-07-14 MED ORDER — PROPOFOL 10 MG/ML IV BOLUS
INTRAVENOUS | Status: DC | PRN
  Administered 2023-07-14: 20 mg via INTRAVENOUS

## 2023-07-14 MED ORDER — GLYCOPYRROLATE 0.2 MG/ML IJ SOLN
INTRAMUSCULAR | Status: AC
  Filled 2023-07-14: qty 1

## 2023-07-14 MED ORDER — OXYCODONE HCL 5 MG/5ML PO SOLN: 5.0000 mg | Freq: Once | ORAL | Status: AC | PRN

## 2023-07-14 MED ORDER — BUPIVACAINE IN DEXTROSE 0.75-8.25 % IT SOLN
INTRATHECAL | Status: DC | PRN
Start: 2023-07-14 — End: 2023-07-14
  Administered 2023-07-14: 12 mg via INTRATHECAL

## 2023-07-14 MED ORDER — BUPIVACAINE-EPINEPHRINE 0.5% -1:200000 IJ SOLN
INTRAMUSCULAR | Status: DC | PRN
  Administered 2023-07-14: 30 mL

## 2023-07-14 MED ORDER — MIRABEGRON ER 25 MG PO TB24
25.0000 mg | ORAL_TABLET | Freq: Every day | ORAL | Status: DC
  Administered 2023-07-15 – 2023-07-18 (×3): 25 mg via ORAL
  Filled 2023-07-14 (×4): qty 1

## 2023-07-14 MED ORDER — ROPIVACAINE HCL 7.5 MG/ML IJ SOLN
INTRAMUSCULAR | Status: DC | PRN
Start: 2023-07-14 — End: 2023-07-14
  Administered 2023-07-14: 20 mL via PERINEURAL

## 2023-07-14 MED ORDER — STERILE WATER FOR IRRIGATION IR SOLN
Status: DC | PRN
  Administered 2023-07-14: 2000 mL

## 2023-07-14 MED ORDER — BUPIVACAINE-EPINEPHRINE (PF) 0.5% -1:200000 IJ SOLN
INTRAMUSCULAR | Status: AC
  Filled 2023-07-14: qty 30

## 2023-07-14 MED ORDER — MENTHOL 3 MG MT LOZG: 1.0000 | LOZENGE | OROMUCOSAL | Status: DC | PRN

## 2023-07-14 MED ORDER — ASPIRIN 81 MG PO CHEW
81.0000 mg | CHEWABLE_TABLET | Freq: Two times a day (BID) | ORAL | Status: DC
  Administered 2023-07-14 – 2023-07-18 (×8): 81 mg via ORAL
  Filled 2023-07-14 (×8): qty 1

## 2023-07-14 MED ORDER — SODIUM CHLORIDE 0.9 % IR SOLN
Status: DC | PRN
  Administered 2023-07-14: 1000 mL

## 2023-07-14 MED ORDER — VITAMIN D 25 MCG (1000 UNIT) PO TABS
2000.0000 [IU] | ORAL_TABLET | Freq: Every day | ORAL | Status: DC
  Administered 2023-07-15 – 2023-07-18 (×4): 2000 [IU] via ORAL
  Filled 2023-07-14 (×4): qty 2

## 2023-07-14 MED ORDER — HYDROMORPHONE HCL 1 MG/ML IJ SOLN: 0.2500 mg | INTRAMUSCULAR | Status: DC | PRN

## 2023-07-14 MED ORDER — MORPHINE SULFATE (PF) 2 MG/ML IV SOLN: 2.0000 mg | INTRAVENOUS | Status: DC | PRN

## 2023-07-14 MED ORDER — LACTATED RINGERS IV SOLN: INTRAVENOUS | Status: DC

## 2023-07-14 MED ORDER — LEVOTHYROXINE SODIUM 100 MCG PO TABS
100.0000 ug | ORAL_TABLET | Freq: Every day | ORAL | Status: DC
  Administered 2023-07-15 – 2023-07-18 (×4): 100 ug via ORAL
  Filled 2023-07-14 (×4): qty 1

## 2023-07-14 MED ORDER — MIDAZOLAM HCL 2 MG/2ML IJ SOLN: 1.0000 mg | INTRAMUSCULAR | Status: DC

## 2023-07-14 MED ORDER — DEXAMETHASONE SODIUM PHOSPHATE 10 MG/ML IJ SOLN: INTRAMUSCULAR | Status: DC | PRN

## 2023-07-14 MED ORDER — SODIUM CHLORIDE 0.9 % IV SOLN: INTRAVENOUS | Status: DC

## 2023-07-14 MED ORDER — AMLODIPINE BESYLATE 5 MG PO TABS
5.0000 mg | ORAL_TABLET | Freq: Every day | ORAL | Status: DC
  Administered 2023-07-15 – 2023-07-18 (×4): 5 mg via ORAL
  Filled 2023-07-14 (×4): qty 1

## 2023-07-14 MED ORDER — OXYCODONE HCL 5 MG PO TABS
5.0000 mg | ORAL_TABLET | ORAL | Status: DC | PRN
  Administered 2023-07-15 – 2023-07-18 (×7): 10 mg via ORAL
  Administered 2023-07-18: 5 mg via ORAL
  Filled 2023-07-14: qty 1
  Filled 2023-07-14 (×12): qty 2

## 2023-07-14 MED ORDER — OXYCODONE HCL 5 MG PO TABS
ORAL_TABLET | ORAL | Status: AC
  Filled 2023-07-14: qty 1

## 2023-07-14 MED ORDER — CEFAZOLIN SODIUM-DEXTROSE 2-4 GM/100ML-% IV SOLN
2.0000 g | INTRAVENOUS | Status: AC
  Administered 2023-07-14: 2 g via INTRAVENOUS
  Filled 2023-07-14: qty 100

## 2023-07-14 MED ORDER — PHENYLEPHRINE HCL-NACL 20-0.9 MG/250ML-% IV SOLN
INTRAVENOUS | Status: DC | PRN
  Administered 2023-07-14: 40 ug/min via INTRAVENOUS

## 2023-07-14 MED ORDER — PANTOPRAZOLE SODIUM 40 MG PO TBEC
40.0000 mg | DELAYED_RELEASE_TABLET | Freq: Every day | ORAL | Status: DC
  Administered 2023-07-15 – 2023-07-18 (×4): 40 mg via ORAL
  Filled 2023-07-14 (×4): qty 1

## 2023-07-14 MED ORDER — DEXAMETHASONE SODIUM PHOSPHATE 10 MG/ML IJ SOLN
INTRAMUSCULAR | Status: AC
  Filled 2023-07-14: qty 1

## 2023-07-14 MED ORDER — CHLORHEXIDINE GLUCONATE 0.12 % MT SOLN
15.0000 mL | Freq: Once | OROMUCOSAL | Status: AC
  Administered 2023-07-14: 15 mL via OROMUCOSAL

## 2023-07-14 MED ORDER — ONDANSETRON HCL 4 MG PO TABS: 4.0000 mg | ORAL_TABLET | Freq: Four times a day (QID) | ORAL | Status: DC | PRN

## 2023-07-14 MED ORDER — POLYETHYLENE GLYCOL 3350 17 G PO PACK: 17.0000 g | PACK | Freq: Every day | ORAL | Status: DC | PRN

## 2023-07-14 MED ORDER — FENTANYL CITRATE PF 50 MCG/ML IJ SOSY: 50.0000 ug | PREFILLED_SYRINGE | INTRAMUSCULAR | Status: DC

## 2023-07-14 MED ORDER — METHOCARBAMOL 500 MG IVPB - SIMPLE MED: 500.0000 mg | Freq: Four times a day (QID) | INTRAVENOUS | Status: DC | PRN

## 2023-07-14 MED ORDER — ORAL CARE MOUTH RINSE: 15.0000 mL | Freq: Once | OROMUCOSAL | Status: AC

## 2023-07-14 MED ORDER — GLYCOPYRROLATE PF 0.2 MG/ML IJ SOSY
PREFILLED_SYRINGE | INTRAMUSCULAR | Status: DC | PRN
  Administered 2023-07-14: .2 mg via INTRAVENOUS

## 2023-07-14 MED ORDER — ONDANSETRON HCL 4 MG/2ML IJ SOLN
INTRAMUSCULAR | Status: DC | PRN
  Administered 2023-07-14: 4 mg via INTRAVENOUS

## 2023-07-14 MED ORDER — MIDAZOLAM HCL 2 MG/2ML IJ SOLN: 0.5000 mg | Freq: Once | INTRAMUSCULAR | Status: DC | PRN

## 2023-07-14 MED ORDER — HYDROMORPHONE HCL 1 MG/ML IJ SOLN
0.5000 mg | INTRAMUSCULAR | Status: DC | PRN
  Administered 2023-07-14 – 2023-07-15 (×4): 1 mg via INTRAVENOUS
  Filled 2023-07-14 (×4): qty 1

## 2023-07-14 MED ORDER — BISACODYL 10 MG RE SUPP: 10.0000 mg | Freq: Every day | RECTAL | Status: DC | PRN

## 2023-07-14 MED ORDER — METOCLOPRAMIDE HCL 5 MG/ML IJ SOLN: 5.0000 mg | Freq: Three times a day (TID) | INTRAMUSCULAR | Status: DC | PRN

## 2023-07-14 MED ORDER — OXYCODONE HCL 5 MG PO TABS
5.0000 mg | ORAL_TABLET | Freq: Once | ORAL | Status: AC | PRN
  Administered 2023-07-14: 5 mg via ORAL

## 2023-07-14 MED ORDER — MEPERIDINE HCL 50 MG/ML IJ SOLN: 6.2500 mg | INTRAMUSCULAR | Status: DC | PRN

## 2023-07-14 MED ORDER — OXYCODONE HCL 5 MG PO TABS
10.0000 mg | ORAL_TABLET | ORAL | Status: DC | PRN
  Administered 2023-07-14: 10 mg via ORAL
  Administered 2023-07-15: 15 mg via ORAL
  Administered 2023-07-15 – 2023-07-18 (×8): 10 mg via ORAL
  Filled 2023-07-14 (×2): qty 2
  Filled 2023-07-14: qty 3
  Filled 2023-07-14 (×2): qty 2

## 2023-07-14 MED ORDER — IRBESARTAN 150 MG PO TABS
300.0000 mg | ORAL_TABLET | Freq: Every day | ORAL | Status: DC
  Administered 2023-07-15 – 2023-07-18 (×4): 300 mg via ORAL
  Filled 2023-07-14 (×4): qty 2

## 2023-07-14 MED ORDER — FENTANYL CITRATE PF 50 MCG/ML IJ SOSY
PREFILLED_SYRINGE | INTRAMUSCULAR | Status: AC
  Administered 2023-07-14: 50 ug via INTRAVENOUS
  Filled 2023-07-14: qty 2

## 2023-07-14 MED ORDER — PHENOL 1.4 % MT LIQD: 1.0000 | OROMUCOSAL | Status: DC | PRN

## 2023-07-14 MED ORDER — ONDANSETRON HCL 4 MG/2ML IJ SOLN: 4.0000 mg | Freq: Four times a day (QID) | INTRAMUSCULAR | Status: DC | PRN

## 2023-07-14 MED ORDER — PROPOFOL 500 MG/50ML IV EMUL
INTRAVENOUS | Status: DC | PRN
  Administered 2023-07-14: 70 ug/kg/min via INTRAVENOUS

## 2023-07-14 MED ORDER — POVIDONE-IODINE 10 % EX SWAB
2.0000 | Freq: Once | CUTANEOUS | Status: AC
  Administered 2023-07-14: 2 via TOPICAL

## 2023-07-14 MED ORDER — ALUM & MAG HYDROXIDE-SIMETH 200-200-20 MG/5ML PO SUSP: 30.0000 mL | ORAL | Status: DC | PRN

## 2023-07-14 MED ORDER — PROPOFOL 1000 MG/100ML IV EMUL
INTRAVENOUS | Status: AC
  Filled 2023-07-14: qty 100

## 2023-07-14 MED ORDER — MIDAZOLAM HCL 2 MG/2ML IJ SOLN
INTRAMUSCULAR | Status: AC
  Administered 2023-07-14: 2 mg via INTRAVENOUS
  Filled 2023-07-14: qty 2

## 2023-07-14 MED ORDER — FERROUS SULFATE 325 (65 FE) MG PO TABS
325.0000 mg | ORAL_TABLET | Freq: Every day | ORAL | Status: DC
  Administered 2023-07-15 – 2023-07-18 (×4): 325 mg via ORAL
  Filled 2023-07-14 (×4): qty 1

## 2023-07-14 MED ORDER — DOCUSATE SODIUM 100 MG PO CAPS
100.0000 mg | ORAL_CAPSULE | Freq: Two times a day (BID) | ORAL | Status: DC
  Administered 2023-07-14 – 2023-07-18 (×8): 100 mg via ORAL
  Filled 2023-07-14 (×8): qty 1

## 2023-07-14 MED ORDER — 0.9 % SODIUM CHLORIDE (POUR BTL) OPTIME
TOPICAL | Status: DC | PRN
  Administered 2023-07-14: 1000 mL

## 2023-07-14 MED ORDER — DIPHENHYDRAMINE HCL 12.5 MG/5ML PO ELIX: 12.5000 mg | ORAL_SOLUTION | ORAL | Status: DC | PRN

## 2023-07-14 SURGICAL SUPPLY — 63 items
APL SKNCLS STERI-STRIP NONHPOA (GAUZE/BANDAGES/DRESSINGS)
BAG COUNTER SPONGE SURGICOUNT (BAG) IMPLANT
BAG SPEC THK2 15X12 ZIP CLS (MISCELLANEOUS) ×1
BAG SPNG CNTER NS LX DISP (BAG)
BAG ZIPLOCK 12X15 (MISCELLANEOUS) ×1 IMPLANT
BENZOIN TINCTURE PRP APPL 2/3 (GAUZE/BANDAGES/DRESSINGS) IMPLANT
BLADE SAG 18X100X1.27 (BLADE) ×1 IMPLANT
BLADE SURG SZ10 CARB STEEL (BLADE) ×2 IMPLANT
BNDG CMPR 6 X 5 YARDS HK CLSR (GAUZE/BANDAGES/DRESSINGS) ×2
BNDG CMPR MED 10X6 ELC LF (GAUZE/BANDAGES/DRESSINGS) ×1
BNDG ELASTIC 6INX 5YD STR LF (GAUZE/BANDAGES/DRESSINGS) ×2 IMPLANT
BNDG ELASTIC 6X10 VLCR STRL LF (GAUZE/BANDAGES/DRESSINGS) IMPLANT
BOWL SMART MIX CTS (DISPOSABLE) IMPLANT
CEMENT BONE SIMPLEX SPEEDSET (Cement) IMPLANT
COMP FEM STD PS KNEE 7 RT (Joint) ×1 IMPLANT
COMP PATELLA 3 PEG 29X9 (Joint) ×1 IMPLANT
COMP TIB PS KNEE 0D E RT (Joint) ×1 IMPLANT
COMPONENT FEM STD PS KNEE 7 RT (Joint) IMPLANT
COMPONENT PATELLA 3 PEG 29X9 (Joint) IMPLANT
COMPONET TIB PS KNEE 0D E RT (Joint) IMPLANT
COOLER ICEMAN CLASSIC (MISCELLANEOUS) ×1 IMPLANT
COVER SURGICAL LIGHT HANDLE (MISCELLANEOUS) ×1 IMPLANT
CUFF TOURN SGL QUICK 34 (TOURNIQUET CUFF) ×1
CUFF TRNQT CYL 34X4.125X (TOURNIQUET CUFF) ×1 IMPLANT
DRAPE INCISE IOBAN 66X45 STRL (DRAPES) ×1 IMPLANT
DRAPE U-SHAPE 47X51 STRL (DRAPES) ×1 IMPLANT
DURAPREP 26ML APPLICATOR (WOUND CARE) ×1 IMPLANT
ELECT BLADE TIP CTD 4 INCH (ELECTRODE) ×1 IMPLANT
ELECT REM PT RETURN 15FT ADLT (MISCELLANEOUS) ×1 IMPLANT
GAUZE PAD ABD 8X10 STRL (GAUZE/BANDAGES/DRESSINGS) ×2 IMPLANT
GAUZE SPONGE 4X4 12PLY STRL (GAUZE/BANDAGES/DRESSINGS) ×1 IMPLANT
GAUZE XEROFORM 1X8 LF (GAUZE/BANDAGES/DRESSINGS) IMPLANT
GLOVE BIO SURGEON STRL SZ7.5 (GLOVE) ×1 IMPLANT
GLOVE BIOGEL PI IND STRL 8 (GLOVE) ×2 IMPLANT
GLOVE ECLIPSE 8.0 STRL XLNG CF (GLOVE) ×1 IMPLANT
GOWN STRL REUS W/ TWL XL LVL3 (GOWN DISPOSABLE) ×2 IMPLANT
GOWN STRL REUS W/TWL XL LVL3 (GOWN DISPOSABLE) ×2
HANDPIECE INTERPULSE COAX TIP (DISPOSABLE) ×1
HOLDER FOLEY CATH W/STRAP (MISCELLANEOUS) IMPLANT
IMMOBILIZER KNEE 20 (SOFTGOODS) ×1
IMMOBILIZER KNEE 20 THIGH 36 (SOFTGOODS) ×1 IMPLANT
INSERT TIB ARTISURF SZ6-7 (Insert) IMPLANT
KIT TURNOVER KIT A (KITS) IMPLANT
NS IRRIG 1000ML POUR BTL (IV SOLUTION) ×1 IMPLANT
PACK TOTAL KNEE CUSTOM (KITS) ×1 IMPLANT
PAD COLD SHLDR WRAP-ON (PAD) ×1 IMPLANT
PADDING CAST COTTON 6X4 STRL (CAST SUPPLIES) ×2 IMPLANT
PIN DRILL HDLS TROCAR 75 4PK (PIN) IMPLANT
PROTECTOR NERVE ULNAR (MISCELLANEOUS) ×1 IMPLANT
SCREW FEMALE HEX FIX 25X2.5 (ORTHOPEDIC DISPOSABLE SUPPLIES) IMPLANT
SET HNDPC FAN SPRY TIP SCT (DISPOSABLE) ×1 IMPLANT
SET PAD KNEE POSITIONER (MISCELLANEOUS) ×1 IMPLANT
SPIKE FLUID TRANSFER (MISCELLANEOUS) IMPLANT
STAPLER VISISTAT 35W (STAPLE) IMPLANT
STRIP CLOSURE SKIN 1/2X4 (GAUZE/BANDAGES/DRESSINGS) IMPLANT
SUT MNCRL AB 4-0 PS2 18 (SUTURE) IMPLANT
SUT VIC AB 0 CT1 27 (SUTURE) ×1
SUT VIC AB 0 CT1 27XBRD ANTBC (SUTURE) ×1 IMPLANT
SUT VIC AB 1 CT1 36 (SUTURE) ×2 IMPLANT
SUT VIC AB 2-0 CT1 27 (SUTURE) ×2
SUT VIC AB 2-0 CT1 TAPERPNT 27 (SUTURE) ×2 IMPLANT
TRAY FOLEY MTR SLVR 16FR STAT (SET/KITS/TRAYS/PACK) IMPLANT
WATER STERILE IRR 1000ML POUR (IV SOLUTION) ×2 IMPLANT

## 2023-07-14 NOTE — Plan of Care (Signed)
  Problem: Education: Goal: Knowledge of General Education information will improve Description: Including pain rating scale, medication(s)/side effects and non-pharmacologic comfort measures Outcome: Progressing   Problem: Activity: Goal: Risk for activity intolerance will decrease Outcome: Progressing   Problem: Clinical Measurements: Goal: Will remain free from infection Outcome: Progressing   

## 2023-07-14 NOTE — Care Plan (Signed)
[facility] RNCM had left VM for patient yesterday and she returned call this morning to discuss her surgery today with Dr. Magnus Ivan ([facility]). She is an Ortho [facility] patient through [facility] and is agreeable to case management. She has a daughter that will be assisting her after discharge at her home. She will need a RW. Anticipate HHPT will be needed after a short hospital stay. Referral made to [facility] after choice provided. Reviewed all post op care instructions. Will continue to follow for needs.

## 2023-07-14 NOTE — Anesthesia Procedure Notes (Signed)
Spinal  Patient location during procedure: OR End time: 07/14/2023 3:09 PM Reason for block: surgical anesthesia Staffing Performed: anesthesiologist  Anesthesiologist: Jairo Ben, MD Performed by: Jairo Ben, MD Authorized by: Jairo Ben, MD   Preanesthetic Checklist Completed: patient identified, IV checked, site marked, risks and benefits discussed, surgical consent, monitors and equipment checked, pre-op evaluation and timeout performed Spinal Block Patient position: sitting Prep: DuraPrep Patient monitoring: heart rate, cardiac monitor, continuous pulse ox and blood pressure Approach: midline Location: L3-4 Injection technique: single-shot Needle Needle type: Pencan and Introducer  Needle gauge: 24 G Needle length: 9 cm Assessment Sensory level: T4 Events: CSF return Additional Notes Pt identified in Operating room.  Monitors applied. Working IV access confirmed. Sterile prep, drape lumbar spine.  1% lido local L 3,4.  #24ga Pencan into clear CSF L 3,4.  12mg  0.75% Bupivacaine with dextrose injected with asp CSF beginning and end of injection.  Patient asymptomatic, VSS, no heme aspirated, tolerated well.  Sandford Craze, MD

## 2023-07-14 NOTE — Interval H&P Note (Signed)
History and Physical Interval Note: The patient understands that she is here today for a right total knee replacement to treat her severe right knee arthritis and pain.  There has been no acute or interval change in her medical status.  The risks and benefits of surgery been discussed in detail and informed consent has been obtained.  The right operative knee has been marked.  07/14/2023 12:53 PM  Gloria [name]  has presented today for surgery, with the diagnosis of osteoarthritis right knee.  The various methods of treatment have been discussed with the patient and family. After consideration of risks, benefits and other options for treatment, the patient has consented to  Procedure(s): RIGHT TOTAL KNEE ARTHROPLASTY (Right) as a surgical intervention.  The patient's history has been reviewed, patient examined, no change in status, stable for surgery.  I have reviewed the patient's chart and labs.  Questions were answered to the patient's satisfaction.     Kathryne Hitch

## 2023-07-14 NOTE — Op Note (Signed)
Operative Note  Date of operation: 07/14/2023 Preoperative diagnosis: Right knee primary osteoarthritis Postoperative diagnosis: Same  Procedure: Right press-fit total knee arthroplasty  Implants: Biomet/Zimmer persona press-fit knee system Implant Name Type Inv. Item Serial No. Manufacturer Lot No. LRB No. Used Action  INSERT TIB ARTISURF SZ6-7 - [id number] Insert INSERT TIB ARTISURF SZ6-7  ZIMMER RECON(ORTH,TRAU,BIO,SG) [id number] Right 1 Implanted  Persona OsseoTi 3 Peg Patella 29mm 9.69mm thickness     [id number] Right 1 Implanted  COMP FEM STD PS KNEE 7 RT - [id number] Joint COMP FEM STD PS KNEE 7 RT  ZIMMER RECON(ORTH,TRAU,BIO,SG) [id number] Right 1 Implanted  COMP TIB PS KNEE 0D E RT - [id number] Joint COMP TIB PS KNEE 0D E RT  ZIMMER RECON(ORTH,TRAU,BIO,SG) [id number] Right 1 Implanted   Surgeon: Vanita Panda. Magnus Ivan, MD Assistant: Rexene Edison, PA-C  Anesthesia: #1 right lower extremity adductor canal block, #2 spinal, #3 local Tourniquet time: Less than 1 hour EBL: Less than 100 cc Antibiotics: 2 g IV Ancef Complications: None  Indications: The patient is a 72 year old female with severe osteoarthritis involving her right knee.  I have seen her for a very long period of time and she has tried and failed conservative treatment for years now.  At this point her right knee pain has become daily.  It is 10 out of 10.  It is detrimentally affecting her mobility, her quality of life and her actives daily living.  Again conservative treatment is no longer helpful at all including multiple injections and aspirations.  She does wish to proceed with knee replacement I agree with this as well.  We talked about the risk of acute blood loss anemia, nerve or vessel injury, fracture, infection, DVT, implant failure and soft tissue wound healing issues.  We talked about her goals being hopefully decrease pain, improve mobility, and improve quality of life.  Procedure description: After informed consent  was obtained and the appropriate right knee was marked, anesthesia obtained a right lower extremity adductor canal block in the holding room.  The patient was then brought to the operating room and set up on the operating table where spinal anesthesia was obtained.  She was laid in supine position on the operating table and a Foley catheter was placed.  A nonsterile tourniquet placed around her upper right thigh and her right thigh, knee, leg and ankle were prepped and draped with DuraPrep and sterile drapes including a sterile stockinette.  A timeout was called and she was identified as correct patient and the correct right knee.  An Esmarch was then used to wrap out the leg and the tourniquet was plated to 300 mm of pressure.  With the knee in an extended position, a midline incision was made over the patella and carried proximally distally.  Dissection was carried down to the knee joint and a medial parapatellar arthrotomy was made finding a very large joint effusion in her knee.  With the knee in a flexed position we found complete cartilage wear of the patellofemoral joint and the lateral compartment of her knee.  The ACL as well as medial lateral meniscus were removed.  Using extramedullary cutting guide we made our proximal tibia cut correction for varus and valgus and a 7 degree slope.  We made this cut taking 2 mm off the low side and we actually backed it down to more millimeters.  We then used a intramedullary cutting guide for the distal femur cut setting this for a right knee at 5 degrees external rotated  and a 10 mm distal femoral cut.  Made that cut without difficulty and brought the knee back down to full extension and we achieved full extension with a 10 mm extension block.  We then went back to the femur and put a femoral sizing guide based off the epicondylar axis.  Based off of this we chose a size 7 femur.  We put a 4-in-1 cutting block for a size 7 femur and made our anterior and posterior cuts  followed by her chamfer cuts.  We then backed the tibia and chose a size E tibial tray for coverage over the tibial plateau for right knee.  We selected the rotation off of the tibial tubercle on the femur.  We did our keel punch and drill hole off of this.  We did find her bone quality to be excellent.  We decided to proceed with press-fit implants.  We then trialed our size E right tibia followed by our size 7 right CR standard femur.  We placed a 10 mm thickness medial congruent right polythene insert and went up to a 12 mm insert and we are pleased with range of motion and stability with the 12 mm insert.  We then made our patella cut and drilled 3 holes for a size 29 press-fit patella button.  We then removed all instrumentation from the knee and irrigate the knee with normal saline solution.  We then placed Marcaine with epinephrine around the arthrotomy.  The knee was dried very well and with the knee in a flexed position we press-fit our Biomet Zimmer tibial tray for right knee size E followed by press fitting our size 7 CR right standard femur.  We placed our 12 mm thickness medial congruent right polythene insert and press-fit our size 29 patella button.  We then put the knee through several cycles of motion and we are pleased with range of motion and stability.  We then let the tourniquet down and hemostasis was obtained with electrocautery.  The arthrotomy was closed with interrupted #1 Vicryl suture followed by 0 Vicryl close deep tissue and 2-0 Vicryl to close subcutaneous tissue.  The skin was closed with staples.  Well-padded sterile dressings applied.  The patient was taken recovery room in stable condition.  Rexene Edison, PA-C did assist during the entire case and beginning to end and his assistance was medically necessary and crucial for soft tissue management and retraction, helping guide implant placement and a layered closure of the wound.

## 2023-07-14 NOTE — Anesthesia Procedure Notes (Signed)
Anesthesia Regional Block: Adductor canal block   Pre-Anesthetic Checklist: , timeout performed,  Correct Patient, Correct Site, Correct Laterality,  Correct Procedure, Correct Position, site marked,  Risks and benefits discussed,  Surgical consent,  Pre-op evaluation,  At surgeon's request and post-op pain management  Laterality: Right and Lower  Prep: chloraprep       Needles:  Injection technique: Single-shot  Needle Type: Echogenic Needle     Needle Length: 9cm  Needle Gauge: 21     Additional Needles:   Procedures:,,,, ultrasound used (permanent image in chart),,    Narrative:  Start time: 07/14/2023 2:40 PM End time: 07/14/2023 2:46 PM Injection made incrementally with aspirations every 5 mL.  Performed by: Personally  Anesthesiologist: Jairo Ben, MD  Additional Notes: Pt identified in Holding room.  Monitors applied. Working IV access confirmed. Timeout, Sterile prep R thigh.  #21ga ECHOgenic Arrow block needle into adductor canal with US guidance.  20cc 0.75% Ropivacaine injected incrementally after negative test dose.  Patient asymptomatic, VSS, no heme aspirated, tolerated well.   Sandford Craze, MD

## 2023-07-14 NOTE — Transfer of Care (Signed)
Immediate Anesthesia Transfer of Care Note  Patient: Gloria [name]  Procedure(s) Performed: RIGHT TOTAL KNEE ARTHROPLASTY (Right: Knee)  Patient Location: PACU  Anesthesia Type:Spinal and MAC combined with regional for post-op pain  Level of Consciousness: awake, alert , oriented, and patient cooperative  Airway & Oxygen Therapy: Patient Spontanous Breathing and Patient connected to face mask oxygen  Post-op Assessment: Report given to RN and Post -op Vital signs reviewed and stable  Post vital signs: Reviewed and stable  Last Vitals:  Vitals Value Taken Time  BP 113/60 07/14/23 1642  Temp    Pulse 47 07/14/23 1643  Resp 23 07/14/23 1644  SpO2 99 % 07/14/23 1643  Vitals shown include unfiled device data.  Last Pain:  Vitals:   07/14/23 1450  TempSrc:   PainSc: 0-No pain         Complications: No notable events documented.

## 2023-07-14 NOTE — Telephone Encounter (Signed)
Ortho bundle pre-op call completed. 

## 2023-07-14 NOTE — Anesthesia Postprocedure Evaluation (Signed)
Anesthesia Post Note  Patient: Gloria [name]  Procedure(s) Performed: RIGHT TOTAL KNEE ARTHROPLASTY (Right: Knee)     Patient location during evaluation: PACU Anesthesia Type: Spinal and MAC Level of consciousness: awake Pain management: pain level controlled Vital Signs Assessment: post-procedure vital signs reviewed and stable Respiratory status: spontaneous breathing, respiratory function stable and nonlabored ventilation Cardiovascular status: blood pressure returned to baseline and stable Postop Assessment: no headache, no backache and no apparent nausea or vomiting Anesthetic complications: no   No notable events documented.  Last Vitals:  Vitals:   07/14/23 1715 07/14/23 1730  BP: 128/79 (!) 142/89  Pulse: (!) 40 (!) 42  Resp: 14 12  Temp:    SpO2: 99% 100%    Last Pain:  Vitals:   07/14/23 1715  TempSrc:   PainSc: 0-No pain                 Linton Rump

## 2023-07-14 NOTE — Anesthesia Preprocedure Evaluation (Addendum)
Anesthesia Evaluation  Patient identified by MRN, date of birth, ID band Patient awake    Reviewed: Allergy & Precautions, NPO status , Patient's Chart, lab work & pertinent test results  History of Anesthesia Complications Negative for: history of anesthetic complications  Airway Mallampati: I  TM Distance: >3 FB Neck ROM: Full    Dental  (+) Edentulous Upper, Edentulous Lower, Lower Dentures, Upper Dentures   Pulmonary neg pulmonary ROS   breath sounds clear to auscultation       Cardiovascular hypertension, Pt. on medications (-) angina  Rhythm:Regular Rate:Normal     Neuro/Psych  Neuromuscular disease (myasthenia gravis: mestinon prn (ptosis), has not needed in years)    GI/Hepatic negative GI ROS, Neg liver ROS,,,  Endo/Other  diabetesHypothyroidism  Raynaud's  Renal/GU Renal InsufficiencyRenal disease     Musculoskeletal  (+) Arthritis ,    Abdominal   Peds  Hematology   Anesthesia Other Findings   Reproductive/Obstetrics                             Anesthesia Physical Anesthesia Plan  ASA: 3  Anesthesia Plan: Spinal   Post-op Pain Management: Regional block* and Tylenol PO (pre-op)*   Induction:   PONV Risk Score and Plan: 2 and Ondansetron and Treatment may vary due to age or medical condition  Airway Management Planned: Natural Airway and Simple Face Mask  Additional Equipment: None  Intra-op Plan:   Post-operative Plan:   Informed Consent: I have reviewed the patients History and Physical, chart, labs and discussed the procedure including the risks, benefits and alternatives for the proposed anesthesia with the patient or authorized representative who has indicated his/her understanding and acceptance.       Plan Discussed with: CRNA and Surgeon  Anesthesia Plan Comments: (Plan SAB with adductor canal block for post op analgesia)        Anesthesia Quick  Evaluation

## 2023-07-15 MED ORDER — ASPIRIN 81 MG PO CHEW: 81.0000 mg | CHEWABLE_TABLET | Freq: Two times a day (BID) | ORAL | 0 refills | Status: AC

## 2023-07-15 MED ORDER — KETOROLAC TROMETHAMINE 15 MG/ML IJ SOLN
7.5000 mg | Freq: Four times a day (QID) | INTRAMUSCULAR | Status: AC
  Administered 2023-07-15 (×3): 7.5 mg via INTRAVENOUS
  Filled 2023-07-15 (×3): qty 1

## 2023-07-15 MED ORDER — OXYCODONE HCL 5 MG PO TABS: 5.0000 mg | ORAL_TABLET | ORAL | 0 refills | Status: DC | PRN

## 2023-07-15 MED ORDER — METHOCARBAMOL 500 MG PO TABS: 500.0000 mg | ORAL_TABLET | Freq: Four times a day (QID) | ORAL | 1 refills | Status: DC | PRN

## 2023-07-15 NOTE — Progress Notes (Signed)
Subjective: 1 Day Post-Op Procedure(s) (LRB): RIGHT TOTAL KNEE ARTHROPLASTY (Right) Patient reports pain as moderate.    Objective: Vital signs in last 24 hours: Temp:  [97.4 F (36.3 C)-99.6 F (37.6 C)] 98.6 F (37 C) (08/03 1007) Pulse Rate:  [39-59] 59 (08/03 1007) Resp:  [12-23] 15 (08/03 1007) BP: (113-173)/(60-89) 173/79 (08/03 1007) SpO2:  [97 %-100 %] 97 % (08/03 1007) Weight:  [70.8 kg] 70.8 kg (08/02 1836)  Intake/Output from previous day: 08/02 0701 - 08/03 0700 In: 2517.1 [I.V.:2217.1; IV Piggyback:300] Out: 3325 [Urine:3275; Blood:50] Intake/Output this shift: No intake/output data recorded.  Recent Labs    07/15/23 0352  HGB 11.9*   Recent Labs    07/15/23 0352  WBC 10.9*  RBC 3.90  HCT 35.3*  PLT 162   Recent Labs    07/15/23 0352  NA 134*  K 3.8  CL 102  CO2 24  BUN 12  CREATININE 0.94  GLUCOSE 167*  CALCIUM 8.7*   No results for input(s): "LABPT", "INR" in the last 72 hours.  Sensation intact distally Intact pulses distally Dorsiflexion/Plantar flexion intact Incision: dressing C/D/I Compartment soft   Assessment/Plan: 1 Day Post-Op Procedure(s) (LRB): RIGHT TOTAL KNEE ARTHROPLASTY (Right) Up with therapy Plan for discharge tomorrow Discharge home with home health      Kathryne Hitch 07/15/2023, 11:57 AM

## 2023-07-15 NOTE — Discharge Instructions (Signed)

## 2023-07-15 NOTE — Evaluation (Signed)
Physical Therapy Evaluation Patient Details Name: Gloria [name] MRN: [id number] DOB: Oct 10, 1951 Today's Date: 07/15/2023  History of Present Illness  72 yo female s/p  R TKA on 07/14/23. PMH: raynaud's, HTN, DM  Clinical Impression  Pt is s/p TKA resulting in the deficits listed below (see PT Problem List).  Pt was able to amb ~ 15' with RW and min assist; incr time and effort. Pt is fearful of elevated pain; was premedicated however is very sleepy and RN reluctant to give additional meds d/t being groggy.continue to follow in acute setting.  Pt will benefit from acute skilled PT to increase their independence and safety with mobility to allow discharge.          If plan is discharge home, recommend the following: A little help with walking and/or transfers;A little help with bathing/dressing/bathroom;Assistance with cooking/housework;Help with stairs or ramp for entrance;Assist for transportation   Can travel by private vehicle        Equipment Recommendations Rolling walker (2 wheels)  Recommendations for Other Services       Functional Status Assessment Patient has had a recent decline in their functional status and demonstrates the ability to make significant improvements in function in a reasonable and predictable amount of time.     Precautions / Restrictions Precautions Precautions: Fall Restrictions Weight Bearing Restrictions: No Other Position/Activity Restrictions: WBAT      Mobility  Bed Mobility Overal bed mobility: Needs Assistance Bed Mobility: Supine to Sit     Supine to sit: Min assist     General bed mobility comments: incr time and effort; cues sequence and technique. assist with RLE    Transfers Overall transfer level: Needs assistance Equipment used: Rolling walker (2 wheels) Transfers: Sit to/from Stand Sit to Stand: Min assist           General transfer comment: cues for hand placement and RLE position     Ambulation/Gait Ambulation/Gait assistance: Min assist Gait Distance (Feet): 15 Feet Assistive device: Rolling walker (2 wheels) Gait Pattern/deviations: Step-to pattern       General Gait Details: cues for sequence, RW position from self. slow but steady gait  Stairs            Wheelchair Mobility     Tilt Bed    Modified Rankin (Stroke Patients Only)       Balance                                             Pertinent Vitals/Pain Pain Assessment Pain Assessment: 0-10 Pain Score: 4  Pain Location: right knee Pain Descriptors / Indicators: Aching, Grimacing, Sore Pain Intervention(s): Limited activity within patient's tolerance, Monitored during session, Premedicated before session, Repositioned    Home Living Family/patient expects to be discharged to:: Private residence Living Arrangements: Alone Available Help at Discharge: Family Type of Home: House Home Access: Stairs to enter   Secretary/administrator of Steps: 2   Home Layout: Two level Home Equipment: None Additional Comments: plans to stay on main level of home initially; dtr assisting    Prior Function Prior Level of Function : Independent/Modified Independent             Mobility Comments: pt is very active at her baseline, plays Pickleball and goes to the gym daily       Hand Dominance  Extremity/Trunk Assessment   Upper Extremity Assessment Upper Extremity Assessment: Overall WFL for tasks assessed    Lower Extremity Assessment Lower Extremity Assessment: RLE deficits/detail RLE Deficits / Details: ankle WFL, knee extension and hip flexion 2/5, limited by post op pain    Cervical / Trunk Assessment Cervical / Trunk Assessment: Normal  Communication   Communication: No difficulties  Cognition Arousal/Alertness: Awake/alert Behavior During Therapy: WFL for tasks assessed/performed Overall Cognitive Status: Within Functional Limits for tasks  assessed                                          General Comments      Exercises Total Joint Exercises Ankle Circles/Pumps: AROM, Both, Limitations Ankle Circles/Pumps Limitations: limited movement R ankle d/t pain   Assessment/Plan    PT Assessment Patient needs continued PT services  PT Problem List Decreased strength;Decreased range of motion;Decreased activity tolerance;Decreased balance;Decreased mobility;Decreased knowledge of precautions;Decreased knowledge of use of DME       PT Treatment Interventions DME instruction;Therapeutic exercise;Gait training;Functional mobility training;Therapeutic activities;Patient/family education;Stair training    PT Goals (Current goals can be found in the Care Plan section)  Acute Rehab PT Goals PT Goal Formulation: With patient Time For Goal Achievement: 07/22/23 Potential to Achieve Goals: Good    Frequency 7X/week     Co-evaluation               AM-PAC PT "6 Clicks" Mobility  Outcome Measure Help needed turning from your back to your side while in a flat bed without using bedrails?: A Little Help needed moving from lying on your back to sitting on the side of a flat bed without using bedrails?: A Little Help needed moving to and from a bed to a chair (including a wheelchair)?: A Little Help needed standing up from a chair using your arms (e.g., wheelchair or bedside chair)?: A Little Help needed to walk in hospital room?: A Lot Help needed climbing 3-5 steps with a railing? : A Lot 6 Click Score: 16    End of Session Equipment Utilized During Treatment: Gait belt;Right knee immobilizer Activity Tolerance: Patient tolerated treatment well Patient left: in chair;with call bell/phone within reach;with chair alarm set;with family/visitor present Nurse Communication: Mobility status PT Visit Diagnosis: Other abnormalities of gait and mobility (R26.89);Difficulty in walking, not elsewhere classified  (R26.2)    Time: 2841-3244 PT Time Calculation (min) (ACUTE ONLY): 41 min   Charges:   PT Evaluation $PT Eval Low Complexity: 1 Low PT Treatments $Gait Training: 8-22 mins $Therapeutic Activity: 8-22 mins PT General Charges $$ ACUTE PT VISIT: 1 Visit         , PT  Acute Rehab Dept [facility]) [phone]  07/15/2023   [facility] 07/15/2023, 1:07 PM

## 2023-07-15 NOTE — Plan of Care (Signed)
  Problem: Nutrition: Goal: Adequate nutrition will be maintained Outcome: Progressing   Problem: Coping: Goal: Level of anxiety will decrease Outcome: Progressing   Problem: Pain Managment: Goal: General experience of comfort will improve Outcome: Progressing   

## 2023-07-15 NOTE — Progress Notes (Signed)
PT TX NOTE  07/15/23 1600  PT Visit Information  Last PT Received On 07/15/23  Assistance Needed Pt cooperative however very sleepy d/t meds;  limited by pain and fatigue; continue efforts  to mobilize; not ready for d/c today from PT standpoint.  History of Present Illness 72 yo female s/p  R TKA on 07/14/23. PMH: raynaud's, HTN, DM  Precautions  Precautions Fall;Knee  Precaution Comments improving quad activation, KI not used this pm  Required Braces or Orthoses Knee Immobilizer - Right  Restrictions  Other Position/Activity Restrictions WBAT  Pain Assessment  Pain Assessment 0-10  Pain Score 5  Pain Location right knee  Pain Descriptors / Indicators Aching;Grimacing;Sore  Pain Intervention(s) Limited activity within patient's tolerance;Monitored during session;Premedicated before session;Repositioned (wanted to leave ice off for a bit)  Cognition  Arousal/Alertness Awake/alert (very sleepy)  Behavior During Therapy [facility] for tasks assessed/performed  Overall Cognitive Status Within Functional Limits for tasks assessed  Bed Mobility  Bed Mobility Sit to Supine  Sit to supine Min assist  General bed mobility comments incr time and effort; cues sequence and technique. assist with RLE  Transfers  Overall transfer level Needs assistance  Equipment used Rolling walker (2 wheels)  Transfers Sit to/from Stand  Sit to Stand Min assist  General transfer comment cues for hand placement and RLE position  Ambulation/Gait  Ambulation/Gait assistance Min assist  Gait Distance (Feet) 5 Feet (~ a few steps fwd and back)  Assistive device Rolling walker (2 wheels)  Gait Pattern/deviations Step-to pattern  General Gait Details cues for sequence, RW position from self. slow but steady gait  Total Joint Exercises  Ankle Circles/Pumps AROM;Both;10 reps  Quad Sets AROM;Both;10 reps  Heel Slides AAROM;Right;10 reps  PT - End of Session  Equipment Utilized During Treatment Gait belt  Activity  Tolerance Patient limited by fatigue;Patient limited by pain  Patient left in bed;with call bell/phone within reach;with bed alarm set;with family/visitor present  Nurse Communication Mobility status   PT - Assessment/Plan  PT Plan Current plan remains appropriate  PT Visit Diagnosis Other abnormalities of gait and mobility (R26.89);Difficulty in walking, not elsewhere classified (R26.2)  PT Frequency (ACUTE ONLY) 7X/week  Follow Up Recommendations Follow physician's recommendations for discharge plan and follow up therapies  Assistance recommended at discharge Intermittent Supervision/Assistance  Patient can return home with the following A little help with walking and/or transfers;A little help with bathing/dressing/bathroom;Assistance with cooking/housework;Help with stairs or ramp for entrance;Assist for transportation  PT equipment Rolling walker (2 wheels)  AM-PAC PT "6 Clicks" Mobility Outcome Measure (Version 2)  Help needed turning from your back to your side while in a flat bed without using bedrails? 3  Help needed moving from lying on your back to sitting on the side of a flat bed without using bedrails? 3  Help needed moving to and from a bed to a chair (including a wheelchair)? 3  Help needed standing up from a chair using your arms (e.g., wheelchair or bedside chair)? 3  Help needed to walk in hospital room? 2  Help needed climbing 3-5 steps with a railing?  1  6 Click Score 15  Consider Recommendation of Discharge To: CIR/SNF/LTACH  PT Goal Progression  Progress towards PT goals Progressing toward goals  Acute Rehab PT Goals  PT Goal Formulation With patient  Time For Goal Achievement 07/22/23  Potential to Achieve Goals Good  PT Time Calculation  PT Start Time (ACUTE ONLY) 1544  PT Stop Time (ACUTE ONLY) 1607  PT Time Calculation (min) (ACUTE ONLY) 23 min  PT General Charges  $$ ACUTE PT VISIT 1 Visit  PT Treatments  $Gait Training 8-22 mins  $Therapeutic Exercise  8-22 mins

## 2023-07-16 NOTE — Plan of Care (Signed)
  Problem: Education: Goal: Knowledge of General Education information will improve Description: Including pain rating scale, medication(s)/side effects and non-pharmacologic comfort measures 07/16/2023 0953 by Beverly Sessions, RN Outcome: Progressing 07/16/2023 0953 by Beverly Sessions, RN Outcome: Progressing   Problem: Safety: Goal: Ability to remain free from injury will improve 07/16/2023 0953 by Beverly Sessions, RN Outcome: Progressing 07/16/2023 0953 by Beverly Sessions, RN Outcome: Progressing

## 2023-07-16 NOTE — Progress Notes (Signed)
  Subjective: Gloria [name] is a 72 y.o. female s/p right TKA.  They are POD 2.  Pt's pain is controlled but moderate.  Pt denies any complain of chest pain, shortness of breath, abdominal pain, calf pain.  Patient denies any fevers but does have intermittent chills.  Had difficulty ambulating more than about 15 feet with physical therapy yesterday.  Objective: Vital signs in last 24 hours: Temp:  [98.6 F (37 C)-99.9 F (37.7 C)] 99.9 F (37.7 C) (08/04 0554) Pulse Rate:  [59-66] 62 (08/04 0554) Resp:  [15-19] 19 (08/04 0554) BP: (125-173)/(61-79) 125/61 (08/04 0554) SpO2:  [92 %-97 %] 92 % (08/04 0554)  Intake/Output from previous day: 08/03 0701 - 08/04 0700 In: 240 [P.O.:240] Out: 1800 [Urine:1800] Intake/Output this shift: No intake/output data recorded.  Exam:  No gross blood or drainage overlying the dressing 2+ DP pulse Sensation intact distally in the operative foot Able to dorsiflex and plantarflex the operative foot No calf tenderness.  Negative Homans' sign. Unable to perform straight leg raise   Labs: Recent Labs    07/15/23 0352  HGB 11.9*   Recent Labs    07/15/23 0352  WBC 10.9*  RBC 3.90  HCT 35.3*  PLT 162   Recent Labs    07/15/23 0352  NA 134*  K 3.8  CL 102  CO2 24  BUN 12  CREATININE 0.94  GLUCOSE 167*  CALCIUM 8.7*   No results for input(s): "LABPT", "INR" in the last 72 hours.  Assessment/Plan: Pt is POD 2 s/p TKA.    -Plan to discharge to home today or tomorrow pending patient's pain and PT eval.    -WBAT with a walker  -Follow-up with Dr. Magnus Ivan in clinic 2 weeks postoperatively    [facility] 07/16/2023, 8:27 AM

## 2023-07-16 NOTE — Plan of Care (Signed)
  Problem: Coping: Goal: Level of anxiety will decrease Outcome: Progressing   Problem: Pain Managment: Goal: General experience of comfort will improve Outcome: Progressing   

## 2023-07-16 NOTE — Progress Notes (Signed)
Physical Therapy Treatment Patient Details Name: Gloria [name] MRN: [id number] DOB: 1951/01/31 Today's Date: 07/16/2023   History of Present Illness 72 yo female s/p  R TKA on 07/14/23. PMH: raynaud's, HTN, DM    PT Comments  Pt continues to appear oversedated but c/o severe pain; able to amb 20' with incr time and cues to stay alert and participate. Will continue PT, likely not ready for d/c today however will see how pt progresses in pm    If plan is discharge home, recommend the following: A little help with walking and/or transfers;A little help with bathing/dressing/bathroom;Assistance with cooking/housework;Help with stairs or ramp for entrance;Assist for transportation   Can travel by private vehicle        Equipment Recommendations  Rolling walker (2 wheels)    Recommendations for Other Services       Precautions / Restrictions Precautions Precautions: Fall;Knee Precaution Comments: improving quad activation, KI not used this pm Required Braces or Orthoses: Knee Immobilizer - Right Restrictions Weight Bearing Restrictions: No RLE Weight Bearing: Weight bearing as tolerated Other Position/Activity Restrictions: WBAT     Mobility  Bed Mobility Overal bed mobility: Needs Assistance Bed Mobility: Supine to Sit     Supine to sit: Min assist Sit to supine: Min assist   General bed mobility comments: incr time and effort; cues sequence and technique. assist with RLE    Transfers Overall transfer level: Needs assistance Equipment used: Rolling walker (2 wheels) Transfers: Sit to/from Stand Sit to Stand: Min assist           General transfer comment: cues for hand placement and RLE position; assist to rise and transition to RW    Ambulation/Gait Ambulation/Gait assistance: Min assist Gait Distance (Feet): 20 Feet Assistive device: Rolling walker (2 wheels) Gait Pattern/deviations: Step-to pattern, Decreased stance time - right, Decreased step length - right,  Decreased step length - left, Trunk flexed       General Gait Details: cues for sequence, posture, RW position from self. slow but steady gait;   Stairs             Wheelchair Mobility     Tilt Bed    Modified Rankin (Stroke Patients Only)       Balance                                            Cognition Arousal/Alertness:  (very sleepy) Behavior During Therapy: WFL for tasks assessed/performed Overall Cognitive Status: Impaired/Different from baseline Area of Impairment: Following commands, Safety/judgement                       Following Commands: Follows one step commands with increased time, Follows multi-step commands inconsistently Safety/Judgement: Decreased awareness of safety, Decreased awareness of deficits     General Comments: repetitious cues d/t lethargy/groggy d/t meds-appears over sedated. repeatedly leans on RW or begins to sit without chair behind, able to redirect with cues        Exercises Total Joint Exercises Ankle Circles/Pumps: AROM, Both, 10 reps Quad Sets: AROM, Both, 10 reps Heel Slides: AAROM, Right, 10 reps    General Comments        Pertinent Vitals/Pain Pain Assessment Pain Assessment: 0-10 Pain Score: 6  Pain Location: right knee Pain Descriptors / Indicators: Aching, Grimacing, Sore    Home Living  Prior Function            PT Goals (current goals can now be found in the care plan section) Acute Rehab PT Goals PT Goal Formulation: With patient Time For Goal Achievement: 07/22/23 Potential to Achieve Goals: Good Progress towards PT goals: Progressing toward goals    Frequency    7X/week      PT Plan Current plan remains appropriate    Co-evaluation              AM-PAC PT "6 Clicks" Mobility   Outcome Measure  Help needed turning from your back to your side while in a flat bed without using bedrails?: A Little Help needed moving  from lying on your back to sitting on the side of a flat bed without using bedrails?: A Little Help needed moving to and from a bed to a chair (including a wheelchair)?: A Little Help needed standing up from a chair using your arms (e.g., wheelchair or bedside chair)?: A Little Help needed to walk in hospital room?: A Lot Help needed climbing 3-5 steps with a railing? : Total 6 Click Score: 15    End of Session Equipment Utilized During Treatment: Gait belt Activity Tolerance: Patient limited by fatigue;Patient limited by pain Patient left: in bed;with call bell/phone within reach;with bed alarm set;with family/visitor present Nurse Communication: Mobility status PT Visit Diagnosis: Other abnormalities of gait and mobility (R26.89);Difficulty in walking, not elsewhere classified (R26.2)     Time: 9811-9147 PT Time Calculation (min) (ACUTE ONLY): 25 min  Charges:    $Gait Training: 8-22 mins $Therapeutic Activity: 8-22 mins PT General Charges $$ ACUTE PT VISIT: 1 Visit                     , PT  Acute Rehab Dept [facility]) [phone]  07/16/2023    [facility] 07/16/2023, 11:02 AM

## 2023-07-16 NOTE — Progress Notes (Signed)
Physical Therapy Treatment Patient Details Name: Gloria [name] MRN: [id number] DOB: 1951/10/28 Today's Date: 07/16/2023   History of Present Illness 72 yo female s/p  R TKA on 07/14/23. PMH: raynaud's, HTN, DM    PT Comments  Pt making limited progress toward goals d/t pain.  Per dtr pt has a low pain tolerance, pt with very low tolerance for knee ROM; able to get to ~ 30 degrees knee flexion with passive stretch and excessive time. Pt continues to be sedated and c/o severe pain with light activity    If plan is discharge home, recommend the following: A little help with walking and/or transfers;A little help with bathing/dressing/bathroom;Assistance with cooking/housework;Help with stairs or ramp for entrance;Assist for transportation   Can travel by private vehicle        Equipment Recommendations  Rolling walker (2 wheels)    Recommendations for Other Services       Precautions / Restrictions Precautions Precautions: Fall;Knee Precaution Comments: KI not used this pm to encourage flexion during  functional mobility Required Braces or Orthoses: Knee Immobilizer - Right Restrictions Other Position/Activity Restrictions: WBAT     Mobility  Bed Mobility Overal bed mobility: Needs Assistance Bed Mobility: Supine to Sit     Supine to sit: Min assist Sit to supine: Min assist   General bed mobility comments: incr time and effort; cues sequence and technique. assist with RLE    Transfers Overall transfer level: Needs assistance Equipment used: Rolling walker (2 wheels) Transfers: Sit to/from Stand Sit to Stand: Min assist           General transfer comment: cues for hand placement and RLE position; assist to rise and transition to RW; chair >BSC>bed with incr time; pt unable to void; RN made aware    Ambulation/Gait Ambulation/Gait assistance: Min assist Gait Distance (Feet): 5 Feet Assistive device: Rolling walker (2 wheels) Gait Pattern/deviations: Step-to  pattern, Decreased stance time - right, Decreased step length - right, Decreased step length - left, Trunk flexed, Knee flexed in stance - right, Knee flexed in stance - left       General Gait Details: cues for sequence, posture, RW position from self.   Stairs             Wheelchair Mobility     Tilt Bed    Modified Rankin (Stroke Patients Only)       Balance                                            Cognition Arousal/Alertness: Lethargic, Suspect due to medications (very sleepy) Behavior During Therapy: WFL for tasks assessed/performed Overall Cognitive Status: Impaired/Different from baseline Area of Impairment: Following commands, Safety/judgement                       Following Commands: Follows one step commands with increased time, Follows multi-step commands inconsistently Safety/Judgement: Decreased awareness of safety, Decreased awareness of deficits     General Comments: repetitious cues d/t lethargy/groggy d/t meds-appears over sedated. repeatedly leans on RW or begins to sit without chair behind, able to redirect with cues        Exercises Total Joint Exercises Ankle Circles/Pumps: AROM, Both, 10 reps, AAROM Quad Sets: AROM, Both, 10 reps, Limitations Quad Sets Limitations: poor quad activation, limited by pain Heel Slides: AAROM, Right, 10 reps Straight Leg Raises:  AROM, Strengthening, AAROM, 5 reps, Limitations Straight Leg Raises Limitations: pain    General Comments        Pertinent Vitals/Pain Pain Assessment Pain Assessment: Faces Pain Score: 6  Faces Pain Scale: Hurts worst Pain Location: right knee Pain Descriptors / Indicators: Aching, Grimacing, Sore, Operative site guarding Pain Intervention(s): Limited activity within patient's tolerance, Monitored during session, Premedicated before session, Repositioned (declined ice)    Home Living                          Prior Function             PT Goals (current goals can now be found in the care plan section) Acute Rehab PT Goals PT Goal Formulation: With patient Time For Goal Achievement: 07/22/23 Potential to Achieve Goals: Good Progress towards PT goals: Progressing toward goals    Frequency    7X/week      PT Plan Current plan remains appropriate    Co-evaluation              AM-PAC PT "6 Clicks" Mobility   Outcome Measure  Help needed turning from your back to your side while in a flat bed without using bedrails?: A Little Help needed moving from lying on your back to sitting on the side of a flat bed without using bedrails?: A Little Help needed moving to and from a bed to a chair (including a wheelchair)?: A Little Help needed standing up from a chair using your arms (e.g., wheelchair or bedside chair)?: A Little Help needed to walk in hospital room?: A Lot Help needed climbing 3-5 steps with a railing? : Total 6 Click Score: 15    End of Session Equipment Utilized During Treatment: Gait belt Activity Tolerance: Patient limited by fatigue;Patient limited by pain Patient left: in bed;with call bell/phone within reach;with bed alarm set;with family/visitor present Nurse Communication: Mobility status PT Visit Diagnosis: Other abnormalities of gait and mobility (R26.89);Difficulty in walking, not elsewhere classified (R26.2)     Time: 8657-8469 PT Time Calculation (min) (ACUTE ONLY): 30 min  Charges:    $Therapeutic Activity: 23-37 mins PT General Charges $$ ACUTE PT VISIT: 1 Visit                     , PT  Acute Rehab Dept [facility]) [phone]  07/16/2023    [facility] 07/16/2023, 3:34 PM

## 2023-07-16 NOTE — Plan of Care (Signed)
  Problem: Coping: Goal: Level of anxiety will decrease Outcome: Progressing   Problem: Pain Managment: Goal: General experience of comfort will improve Outcome: Progressing   Problem: Safety: Goal: Ability to remain free from injury will improve Outcome: Progressing   Problem: Elimination: Goal: Will not experience complications related to bowel motility Outcome: Progressing Goal: Will not experience complications related to urinary retention Outcome: Progressing

## 2023-07-17 DIAGNOSIS — E1122 Type 2 diabetes mellitus with diabetic chronic kidney disease: Secondary | ICD-10-CM | POA: Diagnosis present

## 2023-07-17 DIAGNOSIS — E785 Hyperlipidemia, unspecified: Secondary | ICD-10-CM | POA: Diagnosis present

## 2023-07-17 DIAGNOSIS — I7300 Raynaud's syndrome without gangrene: Secondary | ICD-10-CM | POA: Diagnosis present

## 2023-07-17 DIAGNOSIS — N183 Chronic kidney disease, stage 3 unspecified: Secondary | ICD-10-CM | POA: Diagnosis present

## 2023-07-17 DIAGNOSIS — Z7982 Long term (current) use of aspirin: Secondary | ICD-10-CM | POA: Diagnosis not present

## 2023-07-17 DIAGNOSIS — Z888 Allergy status to other drugs, medicaments and biological substances status: Secondary | ICD-10-CM | POA: Diagnosis not present

## 2023-07-17 DIAGNOSIS — Z79899 Other long term (current) drug therapy: Secondary | ICD-10-CM | POA: Diagnosis not present

## 2023-07-17 DIAGNOSIS — Z886 Allergy status to analgesic agent status: Secondary | ICD-10-CM | POA: Diagnosis not present

## 2023-07-17 DIAGNOSIS — I129 Hypertensive chronic kidney disease with stage 1 through stage 4 chronic kidney disease, or unspecified chronic kidney disease: Secondary | ICD-10-CM | POA: Diagnosis present

## 2023-07-17 DIAGNOSIS — R339 Retention of urine, unspecified: Secondary | ICD-10-CM | POA: Diagnosis not present

## 2023-07-17 DIAGNOSIS — I2510 Atherosclerotic heart disease of native coronary artery without angina pectoris: Secondary | ICD-10-CM | POA: Diagnosis present

## 2023-07-17 DIAGNOSIS — G7000 Myasthenia gravis without (acute) exacerbation: Secondary | ICD-10-CM | POA: Diagnosis present

## 2023-07-17 DIAGNOSIS — Z881 Allergy status to other antibiotic agents status: Secondary | ICD-10-CM | POA: Diagnosis not present

## 2023-07-17 DIAGNOSIS — E039 Hypothyroidism, unspecified: Secondary | ICD-10-CM | POA: Diagnosis present

## 2023-07-17 DIAGNOSIS — M1711 Unilateral primary osteoarthritis, right knee: Secondary | ICD-10-CM | POA: Diagnosis present

## 2023-07-17 DIAGNOSIS — Z79890 Hormone replacement therapy (postmenopausal): Secondary | ICD-10-CM | POA: Diagnosis not present

## 2023-07-17 DIAGNOSIS — Z90710 Acquired absence of both cervix and uterus: Secondary | ICD-10-CM | POA: Diagnosis not present

## 2023-07-17 MED ORDER — TAMSULOSIN HCL 0.4 MG PO CAPS
0.4000 mg | ORAL_CAPSULE | Freq: Every day | ORAL | Status: DC
  Administered 2023-07-17 – 2023-07-18 (×2): 0.4 mg via ORAL
  Filled 2023-07-17 (×2): qty 1

## 2023-07-17 NOTE — TOC Transition Note (Signed)
Transition of Care [facility]) - CM/SW Discharge Note   Patient Details  Name: Gloria [name] MRN: [id number] Date of Birth: 1951/03/05  [facility]) CM/SW Contact:  Amada Jupiter, LCSW Phone Number: 07/17/2023, 1:42 PM   Clinical Narrative:    Met with pt and daughter today and confirming need for RW - no DME agency preference - order placed with [employer] for delivery to room.  HHPT prearranged with [facility].  No further TOC needs.   Final next level of care: Home w Home Health Services Barriers to Discharge: No Barriers Identified   Patient Goals and CMS Choice      Discharge Placement                         Discharge Plan and Services Additional resources added to the After Visit Summary for                  DME Arranged: Walker rolling DME Agency: [employer] Date DME Agency Contacted: 07/17/23   Representative spoke with at DME Agency: Timothy Lasso HH Arranged: PT HH Agency: [facility]        Social Determinants of Health (SDOH) Interventions SDOH Screenings   Food Insecurity: No Food Insecurity (07/14/2023)  Housing: High Risk (07/14/2023)  Transportation Needs: Patient Declined (07/14/2023)  Utilities: Not At Risk (07/14/2023)  Alcohol Screen: Low Risk  (11/14/2022)  Depression (PHQ2-9): Low Risk  (07/03/2023)  Financial Resource Strain: Low Risk  (11/14/2022)  Physical Activity: Insufficiently Active (11/14/2022)  Social Connections: Unknown (11/14/2022)  Stress: No Stress Concern Present (11/14/2022)  Tobacco Use: Low Risk  (07/14/2023)     Readmission Risk Interventions     No data to display

## 2023-07-17 NOTE — Progress Notes (Signed)
Subjective: 3 Days Post-Op Procedure(s) (LRB): RIGHT TOTAL KNEE ARTHROPLASTY (Right) Patient reports pain as 8 on 0-10 scale.  No other complaints. Slow progress with PT.   Objective: Vital signs in last 24 hours: Temp:  [98.6 F (37 C)-100.2 F (37.9 C)] 100.2 F (37.9 C) (08/05 0525) Pulse Rate:  [74-83] 83 (08/05 0525) Resp:  [14-17] 17 (08/05 0525) BP: (130-150)/(69-75) 130/75 (08/05 0525) SpO2:  [93 %-97 %] 96 % (08/05 0525)  Intake/Output from previous day: 08/04 0701 - 08/05 0700 In: 1545 [P.O.:720; I.V.:825] Out: 100 [Urine:100] Intake/Output this shift: No intake/output data recorded.  Recent Labs    07/15/23 0352  HGB 11.9*   Recent Labs    07/15/23 0352  WBC 10.9*  RBC 3.90  HCT 35.3*  PLT 162   Recent Labs    07/15/23 0352  NA 134*  K 3.8  CL 102  CO2 24  BUN 12  CREATININE 0.94  GLUCOSE 167*  CALCIUM 8.7*   No results for input(s): "LABPT", "INR" in the last 72 hours.  Dorsiflexion/Plantar flexion intact Incision: dressing C/D/I Compartment soft   Assessment/Plan: 3 Days Post-Op Procedure(s) (LRB): RIGHT TOTAL KNEE ARTHROPLASTY (Right) Up with therapy Patient up to chair . Encouraged patient to move today.  Will add Flomax due to patient's urinary  retention. Monitor urinary output.          07/17/2023, 8:07 AM

## 2023-07-17 NOTE — Progress Notes (Signed)
07/17/23 1646  PT Visit Information  Last PT Received On 07/17/23  Assistance Needed Pt is making progress this session; amb 98' with RW and min assist. Will benefit from another day to allow safe d/c home   History of Present Illness 72 yo female s/p  R TKA on 07/14/23. PMH: raynaud's, HTN, DM  Precautions  Precautions Fall;Knee  Precaution Comments KI not used this pm to encourage flexion during  functional mobility  Required Braces or Orthoses Knee Immobilizer - Right  Knee Immobilizer - Right Other (comment)  Restrictions  Weight Bearing Restrictions No  RLE Weight Bearing WBAT  Pain Assessment  Pain Assessment Faces  Faces Pain Scale 6  Pain Location right knee  Pain Descriptors / Indicators Aching;Grimacing;Sore;Operative site guarding  Pain Intervention(s) Limited activity within patient's tolerance;Monitored during session;Repositioned  Cognition  Arousal/Alertness Awake/alert  Behavior During Therapy WFL for tasks assessed/performed  Overall Cognitive Status Within Functional Limits for tasks assessed  Bed Mobility  Overal bed mobility Needs Assistance  Bed Mobility Supine to Sit  Supine to sit Min guard  General bed mobility comments able to complete without physical assist,  incr time using leg lifter  Transfers  Overall transfer level Needs assistance  Equipment used Rolling walker (2 wheels)  Transfers Sit to/from Stand;Bed to chair/wheelchair/BSC  Sit to Stand Min guard  Bed to/from chair/wheelchair/BSC transfer type: Step pivot  Step pivot transfers Min guard  General transfer comment cues for hand placement and RLE position; assist to rise and transition to RW; assist to steady on stand step pivot bed to [facility]  Ambulation/Gait  Ambulation/Gait assistance Min guard;Min assist  Gait Distance (Feet) 50 Feet  Assistive device Rolling walker (2 wheels)  Gait Pattern/deviations Step-to pattern;Decreased stance time - right;Decreased step length - right;Decreased step  length - left;Trunk flexed;Knee flexed in stance - right;Knee flexed in stance - left  General Gait Details cues for sequence, posture, RW position from self. incr time. assist to progress fwd motion of RW  Gait velocity decr  Total Joint Exercises  Ankle Circles/Pumps AROM;Right;5 reps  [employer] AROM;Both;10 reps;Limitations  Heel Slides AAROM;Right;10 reps  Quad Sets Limitations pain  PT - End of Session  Equipment Utilized During Treatment Gait belt  Activity Tolerance Patient tolerated treatment well  Patient left with call bell/phone within reach;in chair;with family/visitor present  Nurse Communication Mobility status   PT - Assessment/Plan  PT Plan Current plan remains appropriate  PT Visit Diagnosis Other abnormalities of gait and mobility (R26.89);Difficulty in walking, not elsewhere classified (R26.2)  PT Frequency (ACUTE ONLY) 7X/week  Follow Up Recommendations Follow physician's recommendations for discharge plan and follow up therapies  Assistance recommended at discharge Intermittent Supervision/Assistance  Patient can return home with the following A little help with walking and/or transfers;A little help with bathing/dressing/bathroom;Assistance with cooking/housework;Help with stairs or ramp for entrance;Assist for transportation  PT equipment Rolling walker (2 wheels)  AM-PAC PT "6 Clicks" Mobility Outcome Measure (Version 2)  Help needed turning from your back to your side while in a flat bed without using bedrails? 3  Help needed moving from lying on your back to sitting on the side of a flat bed without using bedrails? 3  Help needed moving to and from a bed to a chair (including a wheelchair)? 3  Help needed standing up from a chair using your arms (e.g., wheelchair or bedside chair)? 3  Help needed to walk in hospital room? 3  Help needed climbing 3-5 steps with a  railing?  3  6 Click Score 18  Consider Recommendation of Discharge To: Home with [facility]  PT Goal  Progression  Progress towards PT goals Progressing toward goals  Acute Rehab PT Goals  PT Goal Formulation With patient  Time For Goal Achievement 07/22/23  Potential to Achieve Goals Good  PT Time Calculation  PT Start Time (ACUTE ONLY) 1608  PT Stop Time (ACUTE ONLY) 1644  PT Time Calculation (min) (ACUTE ONLY) 36 min  PT General Charges  $$ ACUTE PT VISIT 1 Visit  PT Treatments  $Gait Training 23-37 mins

## 2023-07-17 NOTE — Progress Notes (Signed)
Patient was unable to void after foley removal yesterday morning, po intake was very little, iv fluids restarted late afternoon, bladder scanned for 120cc's late afternoon, encouraged patient to drink more water, assisted to the bedside commode around 0200, patient able to void 100 cc's, post void residual was 789 cc's, call placed to answering service at 0246 to obtain order for in & out cath, awaiting call back at the present time, patient did get up again to the [facility] at 0330 and voided 125 cc's, no distention or complaints of discomfort noted, will continue to monitor output and also make dayshift aware.

## 2023-07-17 NOTE — Progress Notes (Signed)
Physical Therapy Treatment Patient Details Name: Gloria [name] MRN: [id number] DOB: [birth year] Today's Date: 07/17/2023   History of Present Illness 72 yo female s/p  R TKA on 07/14/23. PMH: raynaud's, HTN, DM    PT Comments  Pt progressing this session, continues to be sleepy however more alert than previous day. Amb ~ 58' with min assist and encouragement     If plan is discharge home, recommend the following: A little help with walking and/or transfers;A little help with bathing/dressing/bathroom;Assistance with cooking/housework;Help with stairs or ramp for entrance;Assist for transportation   Can travel by private vehicle        Equipment Recommendations  Rolling walker (2 wheels)    Recommendations for Other Services       Precautions / Restrictions Precautions Precautions: Fall;Knee Precaution Comments: KI not used this pm to encourage flexion during  functional mobility Required Braces or Orthoses: Knee Immobilizer - Right Knee Immobilizer - Right: Other (comment) Restrictions Weight Bearing Restrictions: No RLE Weight Bearing: Weight bearing as tolerated     Mobility  Bed Mobility               General bed mobility comments: in recliner on arrival    Transfers Overall transfer level: Needs assistance Equipment used: Rolling walker (2 wheels) Transfers: Sit to/from Stand, Bed to chair/wheelchair/BSC Sit to Stand: Min guard, Min assist Stand pivot transfers: Min assist, Min guard         General transfer comment: cues for hand placement and RLE position; assist to rise and transition to RW; assist to steady on stand step pivot bed to [facility]    Ambulation/Gait Ambulation/Gait assistance: Min guard, Min assist Gait Distance (Feet): 32 Feet Assistive device: Rolling walker (2 wheels) Gait Pattern/deviations: Step-to pattern, Decreased stance time - right, Decreased step length - right, Decreased step length - left, Trunk flexed, Knee flexed in stance -  right, Knee flexed in stance - left       General Gait Details: cues for sequence, posture, RW position from self. incr time and encouragement to consistently participate/remain alert   Stairs             Wheelchair Mobility     Tilt Bed    Modified Rankin (Stroke Patients Only)       Balance                                            Cognition Arousal/Alertness: Awake/alert Behavior During Therapy: WFL for tasks assessed/performed Overall Cognitive Status: Within Functional Limits for tasks assessed                                 General Comments: continues to be sleepy however more alert than previous sessions. encouraged to keep blinds open        Exercises Total Joint Exercises Ankle Circles/Pumps: AROM, Both, 10 reps, AAROM    General Comments        Pertinent Vitals/Pain Pain Assessment Pain Assessment: Faces Faces Pain Scale: Hurts even more Pain Location: right knee Pain Descriptors / Indicators: Aching, Grimacing, Sore, Operative site guarding Pain Intervention(s): Limited activity within patient's tolerance, Monitored during session, Premedicated before session, Repositioned, Ice applied    Home Living  Prior Function            PT Goals (current goals can now be found in the care plan section) Acute Rehab PT Goals PT Goal Formulation: With patient Time For Goal Achievement: 07/22/23 Potential to Achieve Goals: Good Progress towards PT goals: Progressing toward goals    Frequency    7X/week      PT Plan Current plan remains appropriate    Co-evaluation              AM-PAC PT "6 Clicks" Mobility   Outcome Measure  Help needed turning from your back to your side while in a flat bed without using bedrails?: A Little Help needed moving from lying on your back to sitting on the side of a flat bed without using bedrails?: A Little Help needed moving to and  from a bed to a chair (including a wheelchair)?: A Little Help needed standing up from a chair using your arms (e.g., wheelchair or bedside chair)?: A Little Help needed to walk in hospital room?: A Little Help needed climbing 3-5 steps with a railing? : A Little 6 Click Score: 18    End of Session Equipment Utilized During Treatment: Gait belt Activity Tolerance: Patient tolerated treatment well Patient left: with call bell/phone within reach;Other (comment) (BSC, NT aware) Nurse Communication: Mobility status PT Visit Diagnosis: Other abnormalities of gait and mobility (R26.89);Difficulty in walking, not elsewhere classified (R26.2)     Time: 4098-1191 PT Time Calculation (min) (ACUTE ONLY): 24 min  Charges:    $Gait Training: 23-37 mins PT General Charges $$ ACUTE PT VISIT: 1 Visit                     , PT  Acute Rehab Dept ([facility]) [phone]  07/17/2023    [facility] 07/17/2023, 1:23 PM

## 2023-07-18 NOTE — Plan of Care (Signed)
Pt ready to DC home with daughter. 

## 2023-07-18 NOTE — Discharge Summary (Signed)
Patient ID: Gloria [name] MRN: [id number] DOB/AGE: 72/29/1952 72 y.o.  Admit date: 07/14/2023 Discharge date: 07/18/2023  Admission Diagnoses:  Principal Problem:   Unilateral primary osteoarthritis, right knee Active Problems:   Status post total right knee replacement   Discharge Diagnoses:  Same  Past Medical History:  Diagnosis Date   Alopecia areata 10/2010 onset   Arthritis    Diabetes mellitus, type 2 (HCC)    no longer per pt   Dyslipidemia    Hyperlipidemia    on meds   Hypertension    on meds   Hypothyroid    on meds   Myasthenia gravis     Surgeries: Procedure(s): RIGHT TOTAL KNEE ARTHROPLASTY on 07/14/2023   Consultants:   Discharged Condition: Improved  Hospital Course: Gloria [name] is an 72 y.o. female who was admitted 07/14/2023 for operative treatment ofUnilateral primary osteoarthritis, right knee. Patient has severe unremitting pain that affects sleep, daily activities, and work/hobbies. After pre-op clearance the patient was taken to the operating room on 07/14/2023 and underwent  Procedure(s): RIGHT TOTAL KNEE ARTHROPLASTY.    Patient was given perioperative antibiotics:  Anti-infectives (From admission, onward)    Start     Dose/Rate Route Frequency Ordered Stop   07/14/23 2100  ceFAZolin (ANCEF) IVPB 1 g/50 mL premix        1 g 100 mL/hr over 30 Minutes Intravenous Every 6 hours 07/14/23 1842 07/15/23 0421   07/14/23 1045  ceFAZolin (ANCEF) IVPB 2g/100 mL premix        2 g 200 mL/hr over 30 Minutes Intravenous On call to O.R. 07/14/23 1043 07/14/23 1500        Patient was given sequential compression devices, early ambulation, and chemoprophylaxis to prevent DVT.  Patient benefited maximally from hospital stay and there were no complications.  Patient had slow progress with physical therapy due to poor pain control.   Recent vital signs: Patient Vitals for the past 24 hrs:  BP Temp Temp src Pulse Resp SpO2  07/18/23 0547 (!) 104/51  100.1 F (37.8 C) Oral 66 17 95 %  07/17/23 2135 (!) 105/91 100.3 F (37.9 C) -- 94 18 100 %  07/17/23 1439 (!) 130/58 97.7 F (36.5 C) Oral 81 18 95 %     Recent laboratory studies: No results for input(s): "WBC", "HGB", "HCT", "PLT", "NA", "K", "CL", "CO2", "BUN", "CREATININE", "GLUCOSE", "INR", "CALCIUM" in the last 72 hours.  Invalid input(s): "PT", "2"   Discharge Medications:   Allergies as of 07/18/2023       Reactions   Acetaminophen    Beta Adrenergic Blockers Other (See Comments)   Clindamycin Other (See Comments)   Unknown   Hydrocodone    Hydrocodone-acetaminophen Swelling   Facial swelling   Lincomycin Other (See Comments)   Lithium Other (See Comments)   Unknown   Morphine Other (See Comments)   Unknown   Neomycin Other (See Comments)   Unknown   Quinidine Other (See Comments)   Quinine Other (See Comments)   Unknown   Timolol Other (See Comments)   Vicodin [hydrocodone-acetaminophen] Swelling   Facial swelling        Medication List     STOP taking these medications    aspirin EC 81 MG tablet Replaced by: aspirin 81 MG chewable tablet       TAKE these medications    acetaminophen 500 MG tablet Commonly known as: TYLENOL Take 500 mg by mouth every 6 (six) hours as needed for moderate  pain or mild pain.   amLODipine 5 MG tablet Commonly known as: NORVASC TAKE 1 TABLET(5 MG) BY MOUTH DAILY   aspirin 81 MG chewable tablet Chew 1 tablet (81 mg total) by mouth 2 (two) times daily. Replaces: aspirin EC 81 MG tablet   atorvastatin 10 MG tablet Commonly known as: LIPITOR TAKE 1 TABLET BY MOUTH DAILY AT 6 PM   BIOTIN 5000 PO Take 10,000 mcg by mouth daily. Hair, skin, Nails   Blood Pressure Monitor Devi Use to check blood pressure once daily   Adult Blood Pressure Cuff Lg Kit 1 kit by Does not apply route daily.   CALCIUM-MAGNESIUM-ZINC PO Take 1 tablet by mouth daily at 6 (six) AM.   diclofenac Sodium 1 % Gel Commonly known as:  Voltaren Apply 4 g topically 4 (four) times daily. What changed:  when to take this reasons to take this   levothyroxine 100 MCG tablet Commonly known as: SYNTHROID TAKE 1 TABLET BY MOUTH DAILY 30 MINUTES PRIOR TO BREAKFAST 6 DAYS A WEEK AND 1/2 TABLET 1 DAY A WEEK   methocarbamol 500 MG tablet Commonly known as: ROBAXIN Take 1 tablet (500 mg total) by mouth every 6 (six) hours as needed for muscle spasms.   mirabegron ER 25 MG Tb24 tablet Commonly known as: Myrbetriq Take 1 tablet (25 mg total) by mouth daily.   mupirocin ointment 2 % Commonly known as: BACTROBAN Apply to affected area daily and as needed.   Omega-3 1000 MG Caps Take 1,000 mg by mouth daily.   oxyCODONE 5 MG immediate release tablet Commonly known as: Oxy IR/ROXICODONE Take 1-2 tablets (5-10 mg total) by mouth every 4 (four) hours as needed for moderate pain (pain score 4-6).   Slow Release Iron 45 MG Tbcr Generic drug: Ferrous Sulfate Dried Take 1 tablet by mouth daily.   Systane 0.4-0.3 % Soln Generic drug: Polyethyl Glycol-Propyl Glycol Place 1 drop into both eyes daily as needed (dry eyes).   telmisartan 80 MG tablet Commonly known as: MICARDIS Take 1 tablet (80 mg total) by mouth daily.   triamcinolone cream 0.1 % Commonly known as: KENALOG APPLY TOPICALLY TO THE AFFECTED AREA TWICE DAILY What changed:  how much to take how to take this when to take this reasons to take this additional instructions   VITAMIN B 12 PO Take 3,000 mcg by mouth daily at 6 (six) AM.   VITAMIN C PO Take 1,000 mg by mouth daily at 6 (six) AM.   Vitamin D 50 MCG (2000 UT) Caps Take 2,000 Units by mouth daily.   WOMENS 50+ MULTI VITAMIN PO Take 1 tablet by mouth daily at 6 (six) AM.               Durable Medical Equipment  (From admission, onward)           Start     Ordered   07/14/23 1842  DME 3 n 1  Once        07/14/23 1842   07/14/23 1842  DME Walker rolling  Once       Question  Answer Comment  Walker: With 5 Inch Wheels   Patient needs a walker to treat with the following condition Status post total right knee replacement      07/14/23 1842            Diagnostic Studies: DG Knee Right Port  Result Date: 07/14/2023 CLINICAL DATA:  Right knee replacement. EXAM: PORTABLE RIGHT KNEE - 1-2 VIEW  COMPARISON:  None Available. FINDINGS: Right knee arthroplasty in expected alignment. No periprosthetic lucency or fracture. Recent postsurgical change includes air and edema in the soft tissues and joint space. Anterior skin staples in place. IMPRESSION: Right knee arthroplasty without immediate postoperative complication. Electronically Signed   By: Narda Rutherford M.D.   On: 07/14/2023 17:18    Disposition: Discharge disposition: 06-Home-Health Care Svc          Follow-up Information     Kathryne Hitch, MD Follow up in 2 week(s).   Specialty: Orthopedic Surgery Contact information: [address] [place] [address] [phone]                  Signed: Richardean Canal 07/18/2023, 8:00 AM

## 2023-07-18 NOTE — Progress Notes (Signed)
Physical Therapy Treatment Patient Details Name: Gloria [name] MRN: [id number] DOB: November 20, 1951 Today's Date: 07/18/2023   History of Present Illness 72 yo female s/p  R TKA on 07/14/23. PMH: raynaud's, HTN, DM    PT Comments  Pt seen for review HEP, gait/activity tolerance this pm; pt agreeable to PT; discussed stairs at home and pt and dtr plan to enter where there are no steps, pt plans to stay on main level of home for a few days until safely able to negotiate stairs with HHPT; discussed bathroom DMF with pt and dtr--likely will wait until HHPT can give input prior to making purchases. Pt amb 100' with RW and CGA this pm which is a personal best since surgery. Dtr present and supportive, pt and dtr feel ready to d/c home today.   If plan is discharge home, recommend the following: A little help with walking and/or transfers;A little help with bathing/dressing/bathroom;Assistance with cooking/housework;Help with stairs or ramp for entrance;Assist for transportation   Can travel by private vehicle        Equipment Recommendations  Rolling walker (2 wheels)    Recommendations for Other Services       Precautions / Restrictions Precautions Precautions: Fall;Knee Restrictions RLE Weight Bearing: Weight bearing as tolerated     Mobility  Bed Mobility Overal bed mobility: Needs Assistance Bed Mobility: Sit to Supine, Supine to Sit     Supine to sit: Supervision Sit to supine: Supervision, Min guard   General bed mobility comments: incr time and effort, min/guard to elevate RLE on to bed    Transfers Overall transfer level: Needs assistance Equipment used: Rolling walker (2 wheels) Transfers: Sit to/from Stand Sit to Stand: Supervision           General transfer comment: for safety, self cues for hand placement    Ambulation/Gait Ambulation/Gait assistance: Min guard Gait Distance (Feet): 100 Feet (10' more) Assistive device: Rolling walker (2 wheels) Gait  Pattern/deviations: Step-to pattern, Step-through pattern, Decreased stance time - right       General Gait Details: cues for sequence, posture, RW position from self. tactile cues to  facilitate step through gait   Stairs             Wheelchair Mobility     Tilt Bed    Modified Rankin (Stroke Patients Only)       Balance                                            Cognition Arousal/Alertness: Awake/alert Behavior During Therapy: WFL for tasks assessed/performed Overall Cognitive Status: Within Functional Limits for tasks assessed                                          Exercises Total Joint Exercises Ankle Circles/Pumps: AROM, Right, 5 reps Quad Sets: AROM, Both, 10 reps, Limitations Quad Sets Limitations: minimal quad activation R Straight Leg Raises: AAROM, Right, 10 reps Knee Flexion: AAROM, Right, 10 reps, Seated Goniometric ROM: grossly 10 to 65 degrees R knee flexion, AAROM    General Comments        Pertinent Vitals/Pain Pain Assessment Pain Assessment: Faces Faces Pain Scale: Hurts even more Pain Location: right knee Pain Descriptors / Indicators: Aching, Grimacing, Sore, Operative site guarding Pain  Intervention(s): Limited activity within patient's tolerance, Monitored during session, Premedicated before session    Home Living                          Prior Function            PT Goals (current goals can now be found in the care plan section) Acute Rehab PT Goals PT Goal Formulation: With patient Time For Goal Achievement: 07/22/23 Potential to Achieve Goals: Good    Frequency    7X/week      PT Plan Current plan remains appropriate    Co-evaluation              AM-PAC PT "6 Clicks" Mobility   Outcome Measure  Help needed turning from your back to your side while in a flat bed without using bedrails?: A Little Help needed moving from lying on your back to sitting on the  side of a flat bed without using bedrails?: A Little Help needed moving to and from a bed to a chair (including a wheelchair)?: A Little Help needed standing up from a chair using your arms (e.g., wheelchair or bedside chair)?: A Little Help needed to walk in hospital room?: A Little Help needed climbing 3-5 steps with a railing? : A Little 6 Click Score: 18    End of Session Equipment Utilized During Treatment: Gait belt Activity Tolerance: Patient tolerated treatment well Patient left: with call bell/phone within reach;in chair;with family/visitor present Nurse Communication: Mobility status PT Visit Diagnosis: Other abnormalities of gait and mobility (R26.89);Difficulty in walking, not elsewhere classified (R26.2)     Time: 0981-1914 PT Time Calculation (min) (ACUTE ONLY): 41 min  Charges:    $Gait Training: 23-37 mins $Therapeutic Activity: 8-22 mins PT General Charges $$ ACUTE PT VISIT: 1 Visit                     , PT  Acute Rehab Dept [facility]) [phone]  07/18/2023    [facility] 07/18/2023, 4:53 PM

## 2023-07-18 NOTE — Plan of Care (Signed)
  Problem: Education: Goal: Knowledge of General Education information will improve Description: Including pain rating scale, medication(s)/side effects and non-pharmacologic comfort measures Outcome: Adequate for Discharge   Problem: Health Behavior/Discharge Planning: Goal: Ability to manage health-related needs will improve Outcome: Progressing   Problem: Clinical Measurements: Goal: Ability to maintain clinical measurements within normal limits will improve Outcome: Adequate for Discharge Goal: Will remain free from infection Outcome: Adequate for Discharge Goal: Diagnostic test results will improve Outcome: Adequate for Discharge Goal: Respiratory complications will improve Outcome: Adequate for Discharge Goal: Cardiovascular complication will be avoided Outcome: Progressing   Problem: Activity: Goal: Risk for activity intolerance will decrease Outcome: Progressing   Problem: Nutrition: Goal: Adequate nutrition will be maintained Outcome: Progressing   Problem: Coping: Goal: Level of anxiety will decrease Outcome: Progressing   Problem: Elimination: Goal: Will not experience complications related to bowel motility Outcome: Progressing Goal: Will not experience complications related to urinary retention Outcome: Completed/Met   Problem: Pain Managment: Goal: General experience of comfort will improve Outcome: Progressing   Problem: Safety: Goal: Ability to remain free from injury will improve Outcome: Progressing   Problem: Skin Integrity: Goal: Risk for impaired skin integrity will decrease Outcome: Adequate for Discharge   Problem: Education: Goal: Knowledge of the prescribed therapeutic regimen will improve Outcome: Completed/Met Goal: Individualized Educational Video(s) Outcome: Completed/Met   Problem: Activity: Goal: Ability to avoid complications of mobility impairment will improve Outcome: Progressing Goal: Range of joint motion will  improve Outcome: Adequate for Discharge   Problem: Clinical Measurements: Goal: Postoperative complications will be avoided or minimized Outcome: Progressing   Problem: Pain Management: Goal: Pain level will decrease with appropriate interventions Outcome: Progressing   Problem: Skin Integrity: Goal: Will show signs of wound healing Outcome: Progressing

## 2023-07-18 NOTE — Progress Notes (Signed)
Physical Therapy Treatment Patient Details Name: Gloria [name] MRN: [id number] DOB: August 21, 1951 Today's Date: 07/18/2023   History of Present Illness 72 yo female s/p  R TKA on 07/14/23. PMH: raynaud's, HTN, DM    PT Comments  Pt progressing this session, improving gait velocity and distance with encouragement. Facilitated incr knee flexion/ROM in sitting; able to get 65 degrees flexion AAROM. Will see again in pm for stair training and pt should be ready for d/c this pm   If plan is discharge home, recommend the following: A little help with walking and/or transfers;A little help with bathing/dressing/bathroom;Assistance with cooking/housework;Help with stairs or ramp for entrance;Assist for transportation   Can travel by private vehicle        Equipment Recommendations  Rolling walker (2 wheels)    Recommendations for Other Services       Precautions / Restrictions Precautions Precautions: Fall;Knee Restrictions Weight Bearing Restrictions: No RLE Weight Bearing: Weight bearing as tolerated     Mobility  Bed Mobility Overal bed mobility: Needs Assistance Bed Mobility: Sit to Supine     Supine to sit: Supervision     General bed mobility comments: incr timer and effort, no physical assist    Transfers Overall transfer level: Needs assistance Equipment used: Rolling walker (2 wheels) Transfers: Sit to/from Stand Sit to Stand: Supervision           General transfer comment: for safety, self cues for hand placement    Ambulation/Gait Ambulation/Gait assistance: Min guard Gait Distance (Feet): 60 Feet Assistive device: Rolling walker (2 wheels) Gait Pattern/deviations: Step-to pattern, Step-through pattern, Decreased stance time - right       General Gait Details: cues for sequence, posture, RW position from self. incr time. assist to progress fwd motion of RW and facilitate step through gait   Stairs             Wheelchair Mobility     Tilt  Bed    Modified Rankin (Stroke Patients Only)       Balance                                            Cognition Arousal/Alertness: Awake/alert Behavior During Therapy: WFL for tasks assessed/performed Overall Cognitive Status: Within Functional Limits for tasks assessed                                          Exercises Total Joint Exercises Quad Sets: AROM, Both, 10 reps, Limitations Quad Sets Limitations: minimal quad activation R Knee Flexion: AAROM, Right, 10 reps, Seated Goniometric ROM: grossly 10 to 65 degrees R knee flexion, AAROM    General Comments        Pertinent Vitals/Pain Pain Assessment Pain Assessment: Faces Faces Pain Scale: Hurts even more Pain Location: right knee Pain Descriptors / Indicators: Aching, Grimacing, Sore, Operative site guarding Pain Intervention(s): Limited activity within patient's tolerance, Monitored during session, Premedicated before session, Repositioned    Home Living                          Prior Function            PT Goals (current goals can now be found in the care plan section) Acute Rehab PT  Goals PT Goal Formulation: With patient Time For Goal Achievement: 07/22/23 Potential to Achieve Goals: Good Progress towards PT goals: Progressing toward goals    Frequency    7X/week      PT Plan Current plan remains appropriate    Co-evaluation              AM-PAC PT "6 Clicks" Mobility   Outcome Measure  Help needed turning from your back to your side while in a flat bed without using bedrails?: A Little Help needed moving from lying on your back to sitting on the side of a flat bed without using bedrails?: A Little Help needed moving to and from a bed to a chair (including a wheelchair)?: A Little Help needed standing up from a chair using your arms (e.g., wheelchair or bedside chair)?: A Little Help needed to walk in hospital room?: A Little Help needed  climbing 3-5 steps with a railing? : A Little 6 Click Score: 18    End of Session Equipment Utilized During Treatment: Gait belt Activity Tolerance: Patient tolerated treatment well Patient left: with call bell/phone within reach;in chair;with family/visitor present Nurse Communication: Mobility status PT Visit Diagnosis: Other abnormalities of gait and mobility (R26.89);Difficulty in walking, not elsewhere classified (R26.2)     Time: 2130-8657 PT Time Calculation (min) (ACUTE ONLY): 28 min  Charges:    $Gait Training: 8-22 mins $Therapeutic Exercise: 8-22 mins PT General Charges $$ ACUTE PT VISIT: 1 Visit                     , PT  Acute Rehab Dept [facility]) [phone]  07/18/2023    [facility] 07/18/2023, 12:39 PM

## 2023-07-18 NOTE — Progress Notes (Signed)
Subjective: 4 Days Post-Op Procedure(s) (LRB): RIGHT TOTAL KNEE ARTHROPLASTY (Right) Patient reports pain as under better control today. Denies chest pain , nausea or SOB.   Objective: Vital signs in last 24 hours: Temp:  [97.7 F (36.5 C)-100.3 F (37.9 C)] 100.1 F (37.8 C) (08/06 0547) Pulse Rate:  [66-94] 66 (08/06 0547) Resp:  [17-18] 17 (08/06 0547) BP: (104-130)/(51-91) 104/51 (08/06 0547) SpO2:  [95 %-100 %] 95 % (08/06 0547)  Intake/Output from previous day: 08/05 0701 - 08/06 0700 In: 120 [P.O.:120] Out: 550 [Urine:550] Intake/Output this shift: No intake/output data recorded.  No results for input(s): "HGB" in the last 72 hours. No results for input(s): "WBC", "RBC", "HCT", "PLT" in the last 72 hours. No results for input(s): "NA", "K", "CL", "CO2", "BUN", "CREATININE", "GLUCOSE", "CALCIUM" in the last 72 hours. No results for input(s): "LABPT", "INR" in the last 72 hours.  Dorsiflexion/Plantar flexion intact Incision: scant drainage Compartment soft   Assessment/Plan: 4 Days Post-Op Procedure(s) (LRB): RIGHT TOTAL KNEE ARTHROPLASTY (Right) Up with therapy Discharge to home today if patient remains stable and is safe from PT standpoint.        07/18/2023, 7:44 AM

## 2023-07-19 ENCOUNTER — Ambulatory Visit: Payer: Self-pay

## 2023-07-19 ENCOUNTER — Telehealth: Payer: Self-pay | Admitting: *Deleted

## 2023-07-19 ENCOUNTER — Encounter (HOSPITAL_COMMUNITY): Payer: Self-pay | Admitting: Orthopaedic Surgery

## 2023-07-19 DIAGNOSIS — N183 Chronic kidney disease, stage 3 unspecified: Secondary | ICD-10-CM | POA: Diagnosis not present

## 2023-07-19 DIAGNOSIS — I129 Hypertensive chronic kidney disease with stage 1 through stage 4 chronic kidney disease, or unspecified chronic kidney disease: Secondary | ICD-10-CM | POA: Diagnosis not present

## 2023-07-19 DIAGNOSIS — G7000 Myasthenia gravis without (acute) exacerbation: Secondary | ICD-10-CM | POA: Diagnosis not present

## 2023-07-19 DIAGNOSIS — I2510 Atherosclerotic heart disease of native coronary artery without angina pectoris: Secondary | ICD-10-CM | POA: Diagnosis not present

## 2023-07-19 DIAGNOSIS — E1122 Type 2 diabetes mellitus with diabetic chronic kidney disease: Secondary | ICD-10-CM | POA: Diagnosis not present

## 2023-07-19 DIAGNOSIS — I7300 Raynaud's syndrome without gangrene: Secondary | ICD-10-CM | POA: Diagnosis not present

## 2023-07-19 DIAGNOSIS — E785 Hyperlipidemia, unspecified: Secondary | ICD-10-CM | POA: Diagnosis not present

## 2023-07-19 DIAGNOSIS — Z471 Aftercare following joint replacement surgery: Secondary | ICD-10-CM | POA: Diagnosis not present

## 2023-07-19 DIAGNOSIS — E039 Hypothyroidism, unspecified: Secondary | ICD-10-CM | POA: Diagnosis not present

## 2023-07-19 NOTE — Transitions of Care (Post Inpatient/ED Visit) (Signed)
07/19/2023  Name: Gloria [name] MRN: [id number] DOB: April 04, 1951  Today's TOC FU Call Status: Today's TOC FU Call Status:: Successful TOC FU Call Completed TOC FU Call Complete Date: 07/19/23  Transition Care Management Follow-up Telephone Call Date of Discharge: 07/18/23 Discharge Facility: [facility]) How have you been since you were released from the hospital?: Better Any questions or concerns?: No  Items Reviewed: Medications obtained,verified, and reconciled?: Yes (Medications Reviewed) Any new allergies since your discharge?: No Dietary orders reviewed?: No Do you have support at home?: Yes People in Home: alone Name of Support/Comfort Primary Source: Ashok Cordia and [place] daughters  Medications Reviewed Today: Medications Reviewed Today     Reviewed by Luella Cook, RN (Case Manager) on 07/19/23 at 1538  Med List Status: <None>   Medication Order Taking? Sig Documenting Provider Last Dose Status Informant  acetaminophen (TYLENOL) 500 MG tablet [id number] Yes Take 500 mg by mouth every 6 (six) hours as needed for moderate pain or mild pain. [provider] Taking Active Self  amLODipine (NORVASC) 5 MG tablet [id number] Yes TAKE 1 TABLET(5 MG) BY MOUTH DAILY Burns, Bobette Mo, MD Taking Active Self  Ascorbic Acid (VITAMIN C PO) [id number] Yes Take 1,000 mg by mouth daily at 6 (six) AM. [provider] Taking Active Self  aspirin 81 MG chewable tablet [id number] Yes Chew 1 tablet (81 mg total) by mouth 2 (two) times daily. Kathryne Hitch, MD Taking Active   atorvastatin (LIPITOR) 10 MG tablet [id number] Yes TAKE 1 TABLET BY MOUTH DAILY AT 6 PM Pincus Sanes, MD Taking Active Self  BIOTIN 5000 PO [id number] Yes Take 10,000 mcg by mouth daily. Hair, skin, Nails [provider] Taking Active Self  Blood Pressure Monitor DEVI [id number] Yes Use to check blood pressure once daily Burns, Bobette Mo, MD Taking Active Self  Blood Pressure Monitoring  (ADULT BLOOD PRESSURE CUFF LG) KIT [id number] Yes 1 kit by Does not apply route daily. Pincus Sanes, MD Taking Active Self  CALCIUM-MAGNESIUM-ZINC PO [id number] Yes Take 1 tablet by mouth daily at 6 (six) AM. [provider] Taking Active Self  Cholecalciferol (VITAMIN D) 50 MCG (2000 UT) CAPS [id number] Yes Take 2,000 Units by mouth daily. [provider] Taking Active Self  Cyanocobalamin (VITAMIN B 12 PO) [id number] Yes Take 3,000 mcg by mouth daily at 6 (six) AM. [provider] Taking Active Self  diclofenac Sodium (VOLTAREN) 1 % GEL [id number] Yes Apply 4 g topically 4 (four) times daily.  Patient taking differently: Apply 4 g topically daily as needed (pain).   Pincus Sanes, MD Taking Active Self  Ferrous Sulfate Dried (SLOW RELEASE IRON) 45 MG TBCR [id number] Yes Take 1 tablet by mouth daily. [provider] Taking Active Self  levothyroxine (SYNTHROID) 100 MCG tablet [id number] Yes TAKE 1 TABLET BY MOUTH DAILY 30 MINUTES PRIOR TO BREAKFAST 6 DAYS A WEEK AND 1/2 TABLET 1 DAY A WEEK Burns, Bobette Mo, MD Taking Active Self  methocarbamol (ROBAXIN) 500 MG tablet [id number] Yes Take 1 tablet (500 mg total) by mouth every 6 (six) hours as needed for muscle spasms. Kathryne Hitch, MD Taking Active   mirabegron ER [facility]) 25 MG TB24 tablet [id number] Yes Take 1 tablet (25 mg total) by mouth daily. Gwenlyn Fudge, FNP Taking Active   Multiple Vitamins-Minerals (WOMENS 50+ MULTI VITAMIN PO) [id number] Yes Take 1 tablet by mouth daily at 6 (six) AM. [provider] Taking Active Self  mupirocin ointment (BACTROBAN)  2 % [id number] Yes Apply to affected area daily and as needed. Gwenlyn Fudge, FNP Taking Active Self  Omega-3 1000 MG CAPS [id number] Yes Take 1,000 mg by mouth daily. [provider] Taking Active Self  oxyCODONE (OXY IR/ROXICODONE) 5 MG immediate release tablet [id number] Yes Take 1-2 tablets (5-10 mg total) by mouth every 4 (four)  hours as needed for moderate pain (pain score 4-6). Kathryne Hitch, MD Taking Active   Polyethyl Glycol-Propyl Glycol (SYSTANE) 0.4-0.3 % SOLN [id number] Yes Place 1 drop into both eyes daily as needed (dry eyes). [provider] Taking Active   telmisartan (MICARDIS) 80 MG tablet [id number] Yes Take 1 tablet (80 mg total) by mouth daily. Pincus Sanes, MD Taking Active Self  triamcinolone cream (KENALOG) 0.1 % [id number] Yes APPLY TOPICALLY TO THE AFFECTED AREA TWICE DAILY  Patient taking differently: Apply 1 Application topically daily as needed (Irritation).   Pincus Sanes, MD Taking Active Self            Home Care and Equipment/Supplies: Were Home Health Services Ordered?: Yes Name of Home Health Agency:: [facility] Has Agency set up a time to come to your home?: Yes First Home Health Visit Date: 07/19/23 Any new equipment or medical supplies ordered?: Yes Name of Medical supply agency?: [facility] Were you able to get the equipment/medical supplies?: Yes Do you have any questions related to the use of the equipment/supplies?: No  Functional Questionnaire: Do you need assistance with bathing/showering or dressing?: Yes Do you need assistance with meal preparation?: Yes Do you need assistance with eating?: No Do you have difficulty maintaining continence: No Do you need assistance with getting out of bed/getting out of a chair/moving?: Yes Do you have difficulty managing or taking your medications?: No  Follow up appointments reviewed: PCP Follow-up appointment confirmed?: NA Specialist Hospital Follow-up appointment confirmed?: Yes Date of Specialist follow-up appointment?: 07/27/23 Follow-Up Specialty Provider:: Dr Rayburn Ma Do you need transportation to your follow-up appointment?: No Do you understand care options if your condition(s) worsen?: Yes-patient verbalized understanding  SDOH Interventions Today    Flowsheet Row Most Recent Value  SDOH  Interventions   Food Insecurity Interventions Intervention Not Indicated  Housing Interventions Intervention Not Indicated  Transportation Interventions Intervention Not Indicated, Patient Resources (Friends/Family)  [Daughter will take to appts]      Interventions Today    Flowsheet Row Most Recent Value  General Interventions   General Interventions Discussed/Reviewed General Interventions Discussed, General Interventions Reviewed, Doctor Visits  Doctor Visits Discussed/Reviewed Doctor Visits Discussed, Doctor Visits Reviewed  Exercise Interventions   Exercise Discussed/Reviewed Exercise Discussed  [Physical therapy with [facility]]  Pharmacy Interventions   Pharmacy Dicussed/Reviewed Pharmacy Topics Discussed      TOC Interventions Today    Flowsheet Row Most Recent Value  TOC Interventions   TOC Interventions Discussed/Reviewed TOC Interventions Discussed, TOC Interventions Reviewed      Gean Maidens BSN RN [facility] [phone]

## 2023-07-19 NOTE — Chronic Care Management (AMB) (Signed)
   07/19/2023  Gloria [name] January 26, 1951 [id number]   Reason for Encounter: Patient is not currently enrolled in the CCM program. CCM enrollment status changed to Previously enrolled.   [facility] [phone]

## 2023-07-21 ENCOUNTER — Other Ambulatory Visit: Payer: Self-pay | Admitting: Physician Assistant

## 2023-07-21 ENCOUNTER — Telehealth: Payer: Self-pay | Admitting: *Deleted

## 2023-07-21 ENCOUNTER — Other Ambulatory Visit: Payer: Self-pay | Admitting: *Deleted

## 2023-07-21 DIAGNOSIS — Z471 Aftercare following joint replacement surgery: Secondary | ICD-10-CM | POA: Diagnosis not present

## 2023-07-21 DIAGNOSIS — I2510 Atherosclerotic heart disease of native coronary artery without angina pectoris: Secondary | ICD-10-CM | POA: Diagnosis not present

## 2023-07-21 DIAGNOSIS — M1711 Unilateral primary osteoarthritis, right knee: Secondary | ICD-10-CM

## 2023-07-21 DIAGNOSIS — E1122 Type 2 diabetes mellitus with diabetic chronic kidney disease: Secondary | ICD-10-CM | POA: Diagnosis not present

## 2023-07-21 DIAGNOSIS — Z96651 Presence of right artificial knee joint: Secondary | ICD-10-CM

## 2023-07-21 DIAGNOSIS — E039 Hypothyroidism, unspecified: Secondary | ICD-10-CM | POA: Diagnosis not present

## 2023-07-21 DIAGNOSIS — I7300 Raynaud's syndrome without gangrene: Secondary | ICD-10-CM | POA: Diagnosis not present

## 2023-07-21 DIAGNOSIS — G7000 Myasthenia gravis without (acute) exacerbation: Secondary | ICD-10-CM | POA: Diagnosis not present

## 2023-07-21 DIAGNOSIS — E785 Hyperlipidemia, unspecified: Secondary | ICD-10-CM | POA: Diagnosis not present

## 2023-07-21 DIAGNOSIS — N183 Chronic kidney disease, stage 3 unspecified: Secondary | ICD-10-CM | POA: Diagnosis not present

## 2023-07-21 DIAGNOSIS — I129 Hypertensive chronic kidney disease with stage 1 through stage 4 chronic kidney disease, or unspecified chronic kidney disease: Secondary | ICD-10-CM | POA: Diagnosis not present

## 2023-07-21 MED ORDER — OXYCODONE HCL 5 MG PO TABS: 5.0000 mg | ORAL_TABLET | Freq: Four times a day (QID) | ORAL | 0 refills | Status: DC | PRN

## 2023-07-21 NOTE — Telephone Encounter (Signed)
Patient is 1 week post op today. She has requested refill of pain medication. Would you mind sending in refill for her. [place] patient. Thank you.

## 2023-07-21 NOTE — Telephone Encounter (Signed)
Great. They are aware. Thank you.

## 2023-07-22 DIAGNOSIS — E039 Hypothyroidism, unspecified: Secondary | ICD-10-CM | POA: Diagnosis not present

## 2023-07-22 DIAGNOSIS — E1122 Type 2 diabetes mellitus with diabetic chronic kidney disease: Secondary | ICD-10-CM | POA: Diagnosis not present

## 2023-07-22 DIAGNOSIS — G7000 Myasthenia gravis without (acute) exacerbation: Secondary | ICD-10-CM | POA: Diagnosis not present

## 2023-07-22 DIAGNOSIS — E785 Hyperlipidemia, unspecified: Secondary | ICD-10-CM | POA: Diagnosis not present

## 2023-07-22 DIAGNOSIS — I129 Hypertensive chronic kidney disease with stage 1 through stage 4 chronic kidney disease, or unspecified chronic kidney disease: Secondary | ICD-10-CM | POA: Diagnosis not present

## 2023-07-22 DIAGNOSIS — I2510 Atherosclerotic heart disease of native coronary artery without angina pectoris: Secondary | ICD-10-CM | POA: Diagnosis not present

## 2023-07-22 DIAGNOSIS — N183 Chronic kidney disease, stage 3 unspecified: Secondary | ICD-10-CM | POA: Diagnosis not present

## 2023-07-22 DIAGNOSIS — Z471 Aftercare following joint replacement surgery: Secondary | ICD-10-CM | POA: Diagnosis not present

## 2023-07-22 DIAGNOSIS — I7300 Raynaud's syndrome without gangrene: Secondary | ICD-10-CM | POA: Diagnosis not present

## 2023-07-24 DIAGNOSIS — I129 Hypertensive chronic kidney disease with stage 1 through stage 4 chronic kidney disease, or unspecified chronic kidney disease: Secondary | ICD-10-CM | POA: Diagnosis not present

## 2023-07-24 DIAGNOSIS — E039 Hypothyroidism, unspecified: Secondary | ICD-10-CM | POA: Diagnosis not present

## 2023-07-24 DIAGNOSIS — Z471 Aftercare following joint replacement surgery: Secondary | ICD-10-CM | POA: Diagnosis not present

## 2023-07-24 DIAGNOSIS — E785 Hyperlipidemia, unspecified: Secondary | ICD-10-CM | POA: Diagnosis not present

## 2023-07-24 DIAGNOSIS — G7000 Myasthenia gravis without (acute) exacerbation: Secondary | ICD-10-CM | POA: Diagnosis not present

## 2023-07-24 DIAGNOSIS — E1122 Type 2 diabetes mellitus with diabetic chronic kidney disease: Secondary | ICD-10-CM | POA: Diagnosis not present

## 2023-07-24 DIAGNOSIS — I7300 Raynaud's syndrome without gangrene: Secondary | ICD-10-CM | POA: Diagnosis not present

## 2023-07-24 DIAGNOSIS — N183 Chronic kidney disease, stage 3 unspecified: Secondary | ICD-10-CM | POA: Diagnosis not present

## 2023-07-24 DIAGNOSIS — I2510 Atherosclerotic heart disease of native coronary artery without angina pectoris: Secondary | ICD-10-CM | POA: Diagnosis not present

## 2023-07-25 ENCOUNTER — Telehealth: Payer: Self-pay | Admitting: *Deleted

## 2023-07-25 ENCOUNTER — Other Ambulatory Visit: Payer: Self-pay | Admitting: Orthopaedic Surgery

## 2023-07-25 ENCOUNTER — Ambulatory Visit: Payer: Medicare HMO

## 2023-07-25 MED ORDER — OXYCODONE HCL 5 MG PO TABS: 5.0000 mg | ORAL_TABLET | Freq: Four times a day (QID) | ORAL | 0 refills | Status: DC | PRN

## 2023-07-25 NOTE — Telephone Encounter (Signed)
Received call from patient's daughter stating she had HHPT yesterday and the therapist reached out to the office to discuss pain medication but I don't see it in the chart. Refill was provided on Friday, 07/21/23 by [place] and patient/daughter didn't look at dosage change of 1-2 every 6 hours. States her paperwork said every 4 hours from hospital and I explained that it is typical to start spacing a little and to always pay attention to the new prescription label. She will be out today and needs refill. Pain is still moderate to severe for her and she can't seem to get out of the scheduled every 4-6 hours right now and is limiting her therapy some. She is icing, elevating and using compression stockings. Thank you.

## 2023-07-25 NOTE — Telephone Encounter (Signed)
Left VM with patient's daughter updating her on response from MD and medication sent to pharmacy for refill.

## 2023-07-26 DIAGNOSIS — I7300 Raynaud's syndrome without gangrene: Secondary | ICD-10-CM | POA: Diagnosis not present

## 2023-07-26 DIAGNOSIS — G7000 Myasthenia gravis without (acute) exacerbation: Secondary | ICD-10-CM | POA: Diagnosis not present

## 2023-07-26 DIAGNOSIS — I2510 Atherosclerotic heart disease of native coronary artery without angina pectoris: Secondary | ICD-10-CM | POA: Diagnosis not present

## 2023-07-26 DIAGNOSIS — Z471 Aftercare following joint replacement surgery: Secondary | ICD-10-CM | POA: Diagnosis not present

## 2023-07-26 DIAGNOSIS — I129 Hypertensive chronic kidney disease with stage 1 through stage 4 chronic kidney disease, or unspecified chronic kidney disease: Secondary | ICD-10-CM | POA: Diagnosis not present

## 2023-07-26 DIAGNOSIS — E785 Hyperlipidemia, unspecified: Secondary | ICD-10-CM | POA: Diagnosis not present

## 2023-07-26 DIAGNOSIS — E1122 Type 2 diabetes mellitus with diabetic chronic kidney disease: Secondary | ICD-10-CM | POA: Diagnosis not present

## 2023-07-26 DIAGNOSIS — E039 Hypothyroidism, unspecified: Secondary | ICD-10-CM | POA: Diagnosis not present

## 2023-07-26 DIAGNOSIS — N183 Chronic kidney disease, stage 3 unspecified: Secondary | ICD-10-CM | POA: Diagnosis not present

## 2023-07-27 ENCOUNTER — Encounter: Payer: Self-pay | Admitting: Orthopaedic Surgery

## 2023-07-27 ENCOUNTER — Telehealth: Payer: Self-pay | Admitting: Orthopaedic Surgery

## 2023-07-27 ENCOUNTER — Ambulatory Visit (INDEPENDENT_AMBULATORY_CARE_PROVIDER_SITE_OTHER): Payer: Medicare HMO | Admitting: Orthopaedic Surgery

## 2023-07-27 DIAGNOSIS — Z96651 Presence of right artificial knee joint: Secondary | ICD-10-CM

## 2023-07-27 MED ORDER — TIZANIDINE HCL 4 MG PO TABS: 4.0000 mg | ORAL_TABLET | Freq: Three times a day (TID) | ORAL | 1 refills | Status: DC | PRN

## 2023-07-27 MED ORDER — OXYCODONE HCL 5 MG PO TABS: 5.0000 mg | ORAL_TABLET | Freq: Four times a day (QID) | ORAL | 0 refills | Status: DC | PRN

## 2023-07-27 MED ORDER — CELECOXIB 200 MG PO CAPS: 200.0000 mg | ORAL_CAPSULE | Freq: Two times a day (BID) | ORAL | 1 refills | Status: DC | PRN

## 2023-07-27 NOTE — Telephone Encounter (Signed)
FYI.[facility] KitchenMarland Kitchen[facility] stating patient was not comfortable leaving wound open on day 7 so it was wrapped up again, pt has not had a BM since surgery, range of motion was lacking most likely because of the pain Oxycodone was not working for patient CB# [employer] [phone]

## 2023-07-27 NOTE — Progress Notes (Signed)
The patient is here for her first postoperative visit status post a right total knee arthroplasty.  She is having a lot of problems getting her knee bending and moving.  She is also constipated but also having urinary urgency.  She is ambulate with a walker and has home health therapy and starts outpatient physical therapy next week.  She is not getting good sleep at night either.  On exam her calf is soft.  Her right knee incision looks good so the staples were removed and Steri-Strips applied.  This did help improve her motion.  She lacks full extension by about only about 3 degrees and I can flex her to maybe 70 degrees.  I will try Zanaflex instead of Robaxin as a muscle relaxer and refill her oxycodone.  She can stop her baby aspirin and we will put her on some Celebrex as well to see if this will help.  Have recommended melatonin or Benadryl at night.  Will see her back in 4 weeks to see how she is doing overall from a range of motion standpoint but no x-rays are needed.

## 2023-07-28 ENCOUNTER — Telehealth: Payer: Self-pay | Admitting: *Deleted

## 2023-07-28 DIAGNOSIS — E785 Hyperlipidemia, unspecified: Secondary | ICD-10-CM | POA: Diagnosis not present

## 2023-07-28 DIAGNOSIS — G7000 Myasthenia gravis without (acute) exacerbation: Secondary | ICD-10-CM | POA: Diagnosis not present

## 2023-07-28 DIAGNOSIS — E1122 Type 2 diabetes mellitus with diabetic chronic kidney disease: Secondary | ICD-10-CM | POA: Diagnosis not present

## 2023-07-28 DIAGNOSIS — I129 Hypertensive chronic kidney disease with stage 1 through stage 4 chronic kidney disease, or unspecified chronic kidney disease: Secondary | ICD-10-CM | POA: Diagnosis not present

## 2023-07-28 DIAGNOSIS — I2510 Atherosclerotic heart disease of native coronary artery without angina pectoris: Secondary | ICD-10-CM | POA: Diagnosis not present

## 2023-07-28 DIAGNOSIS — N183 Chronic kidney disease, stage 3 unspecified: Secondary | ICD-10-CM | POA: Diagnosis not present

## 2023-07-28 DIAGNOSIS — I7300 Raynaud's syndrome without gangrene: Secondary | ICD-10-CM | POA: Diagnosis not present

## 2023-07-28 DIAGNOSIS — E039 Hypothyroidism, unspecified: Secondary | ICD-10-CM | POA: Diagnosis not present

## 2023-07-28 DIAGNOSIS — Z471 Aftercare following joint replacement surgery: Secondary | ICD-10-CM | POA: Diagnosis not present

## 2023-07-28 NOTE — Telephone Encounter (Signed)
14 day call completed.

## 2023-07-31 ENCOUNTER — Other Ambulatory Visit: Payer: Self-pay | Admitting: Orthopaedic Surgery

## 2023-07-31 ENCOUNTER — Other Ambulatory Visit: Payer: Self-pay

## 2023-07-31 ENCOUNTER — Telehealth: Payer: Self-pay | Admitting: *Deleted

## 2023-07-31 ENCOUNTER — Ambulatory Visit: Payer: Medicare HMO | Attending: Internal Medicine

## 2023-07-31 DIAGNOSIS — R2689 Other abnormalities of gait and mobility: Secondary | ICD-10-CM | POA: Diagnosis not present

## 2023-07-31 DIAGNOSIS — Z96651 Presence of right artificial knee joint: Secondary | ICD-10-CM | POA: Diagnosis not present

## 2023-07-31 DIAGNOSIS — M25561 Pain in right knee: Secondary | ICD-10-CM | POA: Diagnosis not present

## 2023-07-31 DIAGNOSIS — M6281 Muscle weakness (generalized): Secondary | ICD-10-CM | POA: Diagnosis not present

## 2023-07-31 DIAGNOSIS — M1711 Unilateral primary osteoarthritis, right knee: Secondary | ICD-10-CM | POA: Diagnosis not present

## 2023-07-31 MED ORDER — OXYCODONE HCL 5 MG PO TABS: 5.0000 mg | ORAL_TABLET | Freq: Four times a day (QID) | ORAL | 0 refills | Status: DC | PRN

## 2023-07-31 NOTE — Therapy (Signed)
OUTPATIENT PHYSICAL THERAPY LOWER EXTREMITY EVALUATION   Patient Name: Gloria [name] MRN: [id number] DOB:02-03-1951, 72 y.o., female Today's Date: 08/01/2023  END OF SESSION:  PT End of Session - 08/01/23 0911     Visit Number 1    Number of Visits 24    Date for PT Re-Evaluation 10/24/23    Authorization Type [employer] MCR    PT Start Time 1445    PT Stop Time 1525    PT Time Calculation (min) 40 min    Activity Tolerance Patient limited by pain             Past Medical History:  Diagnosis Date   Alopecia areata 10/2010 onset   Arthritis    Diabetes mellitus, type 2 (HCC)    no longer per pt   Dyslipidemia    Hyperlipidemia    on meds   Hypertension    on meds   Hypothyroid    on meds   Myasthenia gravis    Past Surgical History:  Procedure Laterality Date   ABDOMINAL HYSTERECTOMY  12/12/1988   CARPAL TUNNEL RELEASE Left    CARPAL TUNNEL RELEASE Right    COLONOSCOPY  2008   [facility]   TOTAL KNEE ARTHROPLASTY Right 07/14/2023   Procedure: RIGHT TOTAL KNEE ARTHROPLASTY;  Surgeon: Kathryne Hitch, MD;  Location: [facility];  Service: Orthopedics;  Laterality: Right;   Patient Active Problem List   Diagnosis Date Noted   Status post total right knee replacement 07/14/2023   CKD (chronic kidney disease) stage 3, GFR 30-59 ml/min (HCC) 03/08/2023   Arthropathy of lumbar facet joint 11/14/2022   Unilateral primary osteoarthritis, right knee 09/26/2022   Head injury 04/15/2022   Bilateral low back pain without sciatica 09/29/2021   Bradycardia 08/20/2020   Acute cervical radiculopathy 07/09/2020   Pruritus 02/06/2020   Raynaud's phenomenon 01/06/2020   Left wrist tendinitis 04/30/2018   Hand numbness 01/24/2018   Lumbar radiculopathy 12/23/2016   Plantar fasciitis of right foot 06/22/2016   Carpal tunnel syndrome of right wrist 03/07/2013   Myasthenia gravis without exacerbation (HCC) 12/31/2010   Alopecia 12/31/2010   Hypothyroidism 12/29/2010   Type 2  diabetes, diet controlled (HCC) 12/29/2010   Hyperlipidemia 12/29/2010   Essential hypertension 12/29/2010   Coronary atherosclerosis 12/29/2010   Osteoarthritis 12/29/2010    PCP: Pincus Sanes, MD  REFERRING PROVIDER: Kathryne Hitch, MD  REFERRING DIAG:  [phone] (ICD-10-CM) - Status post total right knee replacement M17.11 (ICD-10-CM) - Unilateral primary osteoarthritis, right knee  THERAPY DIAG:  Acute pain of right knee  Muscle weakness (generalized)  Other abnormalities of gait and mobility  Rationale for Evaluation and Treatment: Rehabilitation  ONSET DATE: 07/14/2023  SUBJECTIVE:   SUBJECTIVE STATEMENT: Pt presents to PT s/p R TKA performed by Dr. Magnus Ivan on 07/14/2023. Had been seeing HHPT for last few weeks since after surgery. Has had difficulty with stair navigation and notes general increase in pain today. States she doesn't feel like she can do much due to pain, not sure why today is uncomfortable. Denies pain in calf, PT educated on s/s of DVT.   PERTINENT HISTORY: HTN, Myasthenia Gravis, DM II, CKD  PAIN:  Are you having pain?  Yes: NPRS scale: 10/10 Worst: 10/10 Pain location: Right knee Pain description: sore, post exercise Aggravating factors: walking, standing Relieving factors: rest, ice  PRECAUTIONS: None  RED FLAGS: None   WEIGHT BEARING RESTRICTIONS: No  FALLS:  Has patient fallen in last 6 months? No  LIVING ENVIRONMENT: Lives with: lives alone Lives in: House/apartment Stairs: Yes: Internal: 14 steps; on right going up and External: 2 steps; none Has following equipment at home: Walker - 2 wheeled  OCCUPATION: Retired  PLOF: Independent  PATIENT GOALS: improve right knee to be able to get back to walking and working out  OBJECTIVE:   DIAGNOSTIC FINDINGS: See imaging  PATIENT SURVEYS:  FOTO: 26% function; 54% predicted  COGNITION: Overall cognitive status: Within functional limits for tasks  assessed     SENSATION: Light touch: Impaired - lateral R LE  EDEMA:  DNT  POSTURE: rounded shoulders and forward head  PALPATION: TTP to distal R quad, patellar hypomobility  LOWER EXTREMITY ROM:  Active ROM Right eval Left eval  Hip flexion    Hip extension    Hip abduction    Hip adduction    Hip internal rotation    Hip external rotation    Knee flexion 72 sitting WNL  Knee extension 19 WNL  Ankle dorsiflexion    Ankle plantarflexion    Ankle inversion    Ankle eversion     (Blank rows = not tested)  LOWER EXTREMITY MMT:  MMT Right eval Left eval  Hip flexion    Hip extension    Hip abduction    Hip adduction    Hip internal rotation    Hip external rotation    Knee flexion DNT 4/5  Knee extension DNT 4/5  Ankle dorsiflexion    Ankle plantarflexion    Ankle inversion    Ankle eversion     (Blank rows = not tested)  LOWER EXTREMITY SPECIAL TESTS:  DNT  FUNCTIONAL TESTS:  TUG: 41 seconds with FWW 30 Second Sit to Stand: 4 reps with B UE  GAIT: Distance walked: 34ft Assistive device utilized: Environmental consultant - 2 wheeled Level of assistance: Modified independence Comments: antalgic gait R, decreased knee ext R  TREATMENT: [facility] Adult PT Treatment:                                                DATE: 07/31/2023 Therapeutic Exercise: Supine quad set x 5 - 5" hold Supine heel slide x 3 - pt stopped due to pain Seated hamstring stretch x 30" R Seated heel slide x 5 - 5" hold Supine ankle pumps x 20  PATIENT EDUCATION:  Education details: eval findings, FOTO, HEP, POC Person educated: Patient Education method: Explanation, Demonstration, and Handouts Education comprehension: verbalized understanding and returned demonstration  HOME EXERCISE PROGRAM: Access Code: [email or web address] URL: [email or web address] Date: 07/31/2023 Prepared by: Edwinna Areola  Exercises - Supine Quadricep Sets  - 4-5 x daily - 7 x weekly - 2 sets - 10 reps - 5 sec  hold - Supine Heel Slide with Strap  - 4-5 x daily - 7 x weekly - 2 sets - 10 reps - 5 sec hold - Seated Hamstring Stretch  - 4-5 x daily - 7 x weekly - 2 reps - 30 sec hold - Seated Heel Slide  - 4-5 x daily - 7 x weekly - 2 sets - 10 reps - 5 sec hold - Supine Ankle Pumps  - 4-5 x daily - 7 x weekly - 30 reps  ASSESSMENT:  CLINICAL IMPRESSION: Patient is a 72 y.o. F who was seen today for physical therapy evaluation and treatment s/p R TKA performed on  07/14/2023. Physical findings are consistent with surgery and recovery timeline as pt demonstrates decrease in R knee strength and ROM. Did appear to self limit ROM today due to pain. Her FOTO score shows she is operating well below PLOF. She would benefit from skilled PT services working on improving strength, ROM, gait, and mobility post surgery.   OBJECTIVE IMPAIRMENTS: Abnormal gait, decreased activity tolerance, decreased balance, decreased mobility, difficulty walking, decreased ROM, decreased strength, and pain.   ACTIVITY LIMITATIONS: sitting, standing, squatting, sleeping, stairs, transfers, bed mobility, and locomotion level  PARTICIPATION LIMITATIONS: driving, shopping, community activity, occupation, and yard work  PERSONAL FACTORS: Fitness and 3+ comorbidities: HTN, Myasthenia Gravis, DM II, CKD  are also affecting patient's functional outcome.   REHAB POTENTIAL: Good  CLINICAL DECISION MAKING: Evolving/moderate complexity  EVALUATION COMPLEXITY: Moderate   GOALS: Goals reviewed with patient? No  SHORT TERM GOALS: Target date: 08/21/2023   Pt will be compliant and knowledgeable with initial HEP for improved comfort and carryover Baseline: initial HEP given  Goal status: INITIAL  2.  Pt will self report right knee pain no greater than 6/10 for improved comfort and functional ability Baseline: 10/10 at worst Goal status: INITIAL   LONG TERM GOALS: Target date: 10/24/2023   Pt will improve FOTO function score to no less  than 54% as proxy for functional improvement Baseline: 26% function Goal status: INITIAL   2.  Pt will self report right knee pain no greater than 3/10 for improved comfort and functional ability Baseline: 10/10 at worst Goal status: INITIAL   3.  Pt will increase 30 Second Sit to Stand rep count to no less than 8 reps for improved balance, strength, and functional mobility Baseline: 4 reps  Goal status: INITIAL   4.  Pt will decrease TUG score to no greater than 18 seconds with least restrictive assistive device for improved safety and decreased fall risk Baseline: 41 seconds FWW Goal status: INITIAL  5.  Pt will improve right knee AROM to at least range of 3-110 degrees for improved comfort, mobility, and gait Baseline: see ROM chart Goal status: INITIAL  PLAN:  PT FREQUENCY: 2x/week  PT DURATION: 12 weeks  PLANNED INTERVENTIONS: Therapeutic exercises, Therapeutic activity, Neuromuscular re-education, Balance training, Gait training, Patient/Family education, Self Care, Joint mobilization, Dry Needling, Electrical stimulation, Cryotherapy, Moist heat, Vasopneumatic device, Manual therapy, and Re-evaluation  PLAN FOR NEXT SESSION: assess HEP response, knee ROM, quad and hip strength, gait training   Referring diagnosis?  U98.119 (ICD-10-CM) - Status post total right knee replacement M17.11 (ICD-10-CM) - Unilateral primary osteoarthritis, right knee  Treatment diagnosis? (if different than referring diagnosis)  Acute pain of right knee Muscle weakness (generalized) Other abnormalities of gait and mobility  What was this (referring dx) caused by? [x]  Surgery []  Fall []  Ongoing issue []  Arthritis []  Other: ____________  Laterality: [x]  Rt []  Lt []  Both  Check all possible CPT codes:  *CHOOSE 10 OR LESS*    [x]  97110 (Therapeutic Exercise)  []  92507 (SLP Treatment)  [x]  97112 (Neuro Re-ed)   []  14782 (Swallowing Treatment)   [x]  97116 (Gait Training)   []  [id number]  (Cognitive Training, 1st 15 minutes) [x]  97140 (Manual Therapy)   []  97130 (Cognitive Training, each add'l 15 minutes)  [x]  97164 (Re-evaluation)                              []  Other, List CPT Code ____________  [x]   21308 (Therapeutic Activities)     [x]  97535 (Self Care)   []  All codes above (97110 - 97535)  []  97012 (Mechanical Traction)  [x]  97014 (E-stim Unattended)  [x]  97032 (E-stim manual)  []  97033 (Ionto)  []  97035 (Ultrasound) []  97750 (Physical Performance Training) []  [id number] (Aquatic Therapy) [x]  97016 (Vasopneumatic Device) []  [id number] (Paraffin) []  97034 (Contrast Bath) []  97597 (Wound Care 1st 20 sq cm) []  97598 (Wound Care each add'l 20 sq cm) []  97760 (Orthotic Fabrication, Fitting, Training Initial) []  [id number] (Prosthetic Management and Training Initial) []  [id number] (Orthotic or Prosthetic Training/ Modification Subsequent)   Eloy End, PT 08/01/2023, 9:15 AM

## 2023-07-31 NOTE — Telephone Encounter (Signed)
Patient's daughter called requesting refill of pain medication for her mother. Thank you.

## 2023-08-01 NOTE — Telephone Encounter (Signed)
This was sent to pharmacy per chart.

## 2023-08-02 NOTE — Therapy (Signed)
OUTPATIENT PHYSICAL THERAPY LOWER EXTREMITY TREATMENT NOTE   Patient Name: Gloria [name] MRN: [id number] DOB:03-18-1951, 72 y.o., female Today's Date: 08/03/2023  END OF SESSION:  PT End of Session - 08/03/23 1506     Visit Number 2    Number of Visits 24    Date for PT Re-Evaluation 10/24/23    Authorization Type [employer] MCR    PT Start Time 1506    PT Stop Time 1555    PT Time Calculation (min) 49 min    Equipment Utilized During Treatment Gait belt    Activity Tolerance Patient limited by pain    Behavior During Therapy WFL for tasks assessed/performed              Past Medical History:  Diagnosis Date   Alopecia areata 10/2010 onset   Arthritis    Diabetes mellitus, type 2 (HCC)    no longer per pt   Dyslipidemia    Hyperlipidemia    on meds   Hypertension    on meds   Hypothyroid    on meds   Myasthenia gravis    Past Surgical History:  Procedure Laterality Date   ABDOMINAL HYSTERECTOMY  12/12/1988   CARPAL TUNNEL RELEASE Left    CARPAL TUNNEL RELEASE Right    COLONOSCOPY  2008   [facility]   TOTAL KNEE ARTHROPLASTY Right 07/14/2023   Procedure: RIGHT TOTAL KNEE ARTHROPLASTY;  Surgeon: Kathryne Hitch, MD;  Location: [facility];  Service: Orthopedics;  Laterality: Right;   Patient Active Problem List   Diagnosis Date Noted   Status post total right knee replacement 07/14/2023   CKD (chronic kidney disease) stage 3, GFR 30-59 ml/min (HCC) 03/08/2023   Arthropathy of lumbar facet joint 11/14/2022   Unilateral primary osteoarthritis, right knee 09/26/2022   Head injury 04/15/2022   Bilateral low back pain without sciatica 09/29/2021   Bradycardia 08/20/2020   Acute cervical radiculopathy 07/09/2020   Pruritus 02/06/2020   Raynaud's phenomenon 01/06/2020   Left wrist tendinitis 04/30/2018   Hand numbness 01/24/2018   Lumbar radiculopathy 12/23/2016   Plantar fasciitis of right foot 06/22/2016   Carpal tunnel syndrome of right wrist 03/07/2013    Myasthenia gravis without exacerbation (HCC) 12/31/2010   Alopecia 12/31/2010   Hypothyroidism 12/29/2010   Type 2 diabetes, diet controlled (HCC) 12/29/2010   Hyperlipidemia 12/29/2010   Essential hypertension 12/29/2010   Coronary atherosclerosis 12/29/2010   Osteoarthritis 12/29/2010    PCP: Pincus Sanes, MD  REFERRING PROVIDER: Kathryne Hitch, MD  REFERRING DIAG:  [phone] (ICD-10-CM) - Status post total right knee replacement M17.11 (ICD-10-CM) - Unilateral primary osteoarthritis, right knee  THERAPY DIAG:  Acute pain of right knee  Muscle weakness (generalized)  Other abnormalities of gait and mobility  Rationale for Evaluation and Treatment: Rehabilitation  ONSET DATE: 07/14/2023  SUBJECTIVE:   SUBJECTIVE STATEMENT: Pt reports her R knee is doing OK. Pt reports she is experiencing significant pain today. Pt states shecompletes her HEP throughout the day.  EVAL: Pt presents to PT s/p R TKA performed by Dr. Magnus Ivan on 07/14/2023. Had been seeing HHPT for last few weeks since after surgery. Has had difficulty with stair navigation and notes general increase in pain today. States she doesn't feel like she can do much due to pain, not sure why today is uncomfortable. Denies pain in calf, PT educated on s/s of DVT.   PAIN:  Are you having pain?  Yes: NPRS scale: 10/10 Worst: 10/10 Pain location: Right  knee Pain description: sore, post exercise Aggravating factors: walking, standing Relieving factors: rest, ice  PERTINENT HISTORY: HTN, Myasthenia Gravis, DM II, CKD  PRECAUTIONS: None  RED FLAGS: None   WEIGHT BEARING RESTRICTIONS: No  FALLS:  Has patient fallen in last 6 months? No  LIVING ENVIRONMENT: Lives with: lives alone Lives in: House/apartment Stairs: Yes: Internal: 14 steps; on right going up and External: 2 steps; none Has following equipment at home: Environmental consultant - 2 wheeled  OCCUPATION: Retired  PLOF: Independent  PATIENT GOALS: improve  right knee to be able to get back to walking and working out  OBJECTIVE:   DIAGNOSTIC FINDINGS: See imaging  PATIENT SURVEYS:  FOTO: 26% function; 54% predicted  COGNITION: Overall cognitive status: Within functional limits for tasks assessed     SENSATION: Light touch: Impaired - lateral R LE  EDEMA:  DNT  POSTURE: rounded shoulders and forward head  PALPATION: TTP to distal R quad, patellar hypomobility  LOWER EXTREMITY ROM:  Active ROM Right eval Left eval Rt 08/03/23  Hip flexion     Hip extension     Hip abduction     Hip adduction     Hip internal rotation     Hip external rotation     Knee flexion 72 sitting WNL 80d  Knee extension 19 WNL 15d  Ankle dorsiflexion     Ankle plantarflexion     Ankle inversion     Ankle eversion      (Blank rows = not tested)  LOWER EXTREMITY MMT:  MMT Right eval Left eval  Hip flexion    Hip extension    Hip abduction    Hip adduction    Hip internal rotation    Hip external rotation    Knee flexion DNT 4/5  Knee extension DNT 4/5  Ankle dorsiflexion    Ankle plantarflexion    Ankle inversion    Ankle eversion     (Blank rows = not tested)  LOWER EXTREMITY SPECIAL TESTS:  DNT  FUNCTIONAL TESTS:  TUG: 41 seconds with FWW 30 Second Sit to Stand: 4 reps with B UE  GAIT: Distance walked: 42ft Assistive device utilized: Environmental consultant - 2 wheeled Level of assistance: Modified independence Comments: antalgic gait R, decreased knee ext R  TREATMENT: [facility] Adult PT Treatment:                                                DATE: 08/03/23 Therapeutic Exercise: Heel slides c slde disc x5 Supine quad set x 5 - 5" hold Supine heel slide x 3  SLR x10 Supine hip abd x10 with slide disc Seated hamstring stretch  3x 30" R Supine ankle pumps x 20 Updated HEP  [facility] Adult PT Treatment:                                                DATE: 07/31/2023 Therapeutic Exercise: Supine quad set x 5 - 5" hold Supine heel slide x  3 - pt stopped due to pain Seated hamstring stretch x 30" R Seated heel slide x 5 - 5" hold Supine ankle pumps x 20  PATIENT EDUCATION:  Education details: eval findings, FOTO, HEP, POC Person educated: Patient Education method:  Explanation, Demonstration, and Handouts Education comprehension: verbalized understanding and returned demonstration  HOME EXERCISE PROGRAM: Access Code: [email or web address] URL: [email or web address] Date: 08/03/2023 Prepared by: Joellyn Rued  Exercises - Supine Quadricep Sets  - 4-5 x daily - 7 x weekly - 2 sets - 10 reps - 5 sec hold - Supine Heel Slide with Strap  - 4-5 x daily - 7 x weekly - 2 sets - 10 reps - 5 sec hold - Active Straight Leg Raise with Quad Set  - 4-5 x daily - 7 x weekly - 1 sets - 10 reps - 3 hold - Supine Knee Extension Strengthening  - 1 x daily - 7 x weekly - 1 sets - 10 reps - 3 hold - Seated Hamstring Stretch  - 4-5 x daily - 7 x weekly - 2 reps - 30 sec hold - Seated Heel Slide  - 4-5 x daily - 7 x weekly - 2 sets - 10 reps - 5 sec hold - Supine Ankle Pumps  - 4-5 x daily - 7 x weekly - 30 reps  ASSESSMENT:  CLINICAL IMPRESSION: PT was completed for therex to address R knee/LE ROM and strengthening. Secondary to pain, pt completes the therex at a slow pace. SLR and SAQ were added to the pt's HEP. At EOS, the pt made small gains with both her R knee flexion and ext, although significant limitations remain. A cold pack was provided for symptom management after completion of the therex. Pt tolerated PT today without adverse effects. Pt will continue to benefit from skilled PT to address impairments for improved function of the R knee/LE.   EVAL: Patient is a 72 y.o. F who was seen today for physical therapy evaluation and treatment s/p R TKA performed on 07/14/2023. Physical findings are consistent with surgery and recovery timeline as pt demonstrates decrease in R knee strength and ROM. Did appear to self limit ROM today due to  pain. Her FOTO score shows she is operating well below PLOF. She would benefit from skilled PT services working on improving strength, ROM, gait, and mobility post surgery.   OBJECTIVE IMPAIRMENTS: Abnormal gait, decreased activity tolerance, decreased balance, decreased mobility, difficulty walking, decreased ROM, decreased strength, and pain.   ACTIVITY LIMITATIONS: sitting, standing, squatting, sleeping, stairs, transfers, bed mobility, and locomotion level  PARTICIPATION LIMITATIONS: driving, shopping, community activity, occupation, and yard work  PERSONAL FACTORS: Fitness and 3+ comorbidities: HTN, Myasthenia Gravis, DM II, CKD  are also affecting patient's functional outcome.   REHAB POTENTIAL: Good  CLINICAL DECISION MAKING: Evolving/moderate complexity  EVALUATION COMPLEXITY: Moderate   GOALS: Goals reviewed with patient? No  SHORT TERM GOALS: Target date: 08/21/2023   Pt will be compliant and knowledgeable with initial HEP for improved comfort and carryover Baseline: initial HEP given  Goal status: INITIAL  2.  Pt will self report right knee pain no greater than 6/10 for improved comfort and functional ability Baseline: 10/10 at worst Goal status: INITIAL   LONG TERM GOALS: Target date: 10/24/2023   Pt will improve FOTO function score to no less than 54% as proxy for functional improvement Baseline: 26% function Goal status: INITIAL   2.  Pt will self report right knee pain no greater than 3/10 for improved comfort and functional ability Baseline: 10/10 at worst Goal status: INITIAL   3.  Pt will increase 30 Second Sit to Stand rep count to no less than 8 reps for improved balance, strength, and functional mobility Baseline: 4 reps  Goal  status: INITIAL   4.  Pt will decrease TUG score to no greater than 18 seconds with least restrictive assistive device for improved safety and decreased fall risk Baseline: 41 seconds FWW Goal status: INITIAL  5.  Pt will  improve right knee AROM to at least range of 3-110 degrees for improved comfort, mobility, and gait Baseline: see ROM chart Goal status: INITIAL  PLAN:  PT FREQUENCY: 2x/week  PT DURATION: 12 weeks  PLANNED INTERVENTIONS: Therapeutic exercises, Therapeutic activity, Neuromuscular re-education, Balance training, Gait training, Patient/Family education, Self Care, Joint mobilization, Dry Needling, Electrical stimulation, Cryotherapy, Moist heat, Vasopneumatic device, Manual therapy, and Re-evaluation  PLAN FOR NEXT SESSION: assess HEP response, knee ROM, quad and hip strength, gait training    Joellyn Rued MS, PT 08/03/23 6:23 PM

## 2023-08-03 ENCOUNTER — Ambulatory Visit: Payer: Medicare HMO

## 2023-08-03 DIAGNOSIS — Z96651 Presence of right artificial knee joint: Secondary | ICD-10-CM | POA: Diagnosis not present

## 2023-08-03 DIAGNOSIS — M25561 Pain in right knee: Secondary | ICD-10-CM | POA: Diagnosis not present

## 2023-08-03 DIAGNOSIS — R2689 Other abnormalities of gait and mobility: Secondary | ICD-10-CM | POA: Diagnosis not present

## 2023-08-03 DIAGNOSIS — M1711 Unilateral primary osteoarthritis, right knee: Secondary | ICD-10-CM | POA: Diagnosis not present

## 2023-08-03 DIAGNOSIS — M6281 Muscle weakness (generalized): Secondary | ICD-10-CM

## 2023-08-04 ENCOUNTER — Other Ambulatory Visit: Payer: Self-pay | Admitting: Orthopaedic Surgery

## 2023-08-04 ENCOUNTER — Telehealth: Payer: Self-pay | Admitting: *Deleted

## 2023-08-04 MED ORDER — OXYCODONE HCL 5 MG PO TABS: 5.0000 mg | ORAL_TABLET | Freq: Four times a day (QID) | ORAL | 0 refills | Status: DC | PRN

## 2023-08-04 NOTE — Telephone Encounter (Signed)
Refilled by MD. Patient aware.

## 2023-08-04 NOTE — Telephone Encounter (Signed)
Patient requested refill of pain medication prior to the weekend. Thank you.

## 2023-08-07 ENCOUNTER — Ambulatory Visit: Payer: Medicare HMO | Admitting: Physical Therapy

## 2023-08-08 ENCOUNTER — Other Ambulatory Visit: Payer: Self-pay | Admitting: Orthopaedic Surgery

## 2023-08-08 ENCOUNTER — Telehealth: Payer: Self-pay | Admitting: Internal Medicine

## 2023-08-08 ENCOUNTER — Telehealth: Payer: Self-pay | Admitting: Orthopaedic Surgery

## 2023-08-08 MED ORDER — TIZANIDINE HCL 4 MG PO TABS: 4.0000 mg | ORAL_TABLET | Freq: Three times a day (TID) | ORAL | 1 refills | Status: DC | PRN

## 2023-08-08 MED ORDER — OXYCODONE HCL 5 MG PO TABS: 5.0000 mg | ORAL_TABLET | Freq: Four times a day (QID) | ORAL | 0 refills | Status: DC | PRN

## 2023-08-08 NOTE — Telephone Encounter (Signed)
Pt daughter came in with questions about pain and meds for [facility] T

## 2023-08-08 NOTE — Telephone Encounter (Signed)
Patient's daughter Lorelee Cover called advised patient need Rx refilled Oxycodone and Methocarbamol. The number to contact [facility] [phone]

## 2023-08-09 ENCOUNTER — Other Ambulatory Visit: Payer: Self-pay | Admitting: Orthopaedic Surgery

## 2023-08-09 ENCOUNTER — Telehealth: Payer: Self-pay | Admitting: *Deleted

## 2023-08-09 ENCOUNTER — Ambulatory Visit: Payer: Medicare HMO

## 2023-08-09 DIAGNOSIS — Z96651 Presence of right artificial knee joint: Secondary | ICD-10-CM | POA: Diagnosis not present

## 2023-08-09 DIAGNOSIS — M25561 Pain in right knee: Secondary | ICD-10-CM

## 2023-08-09 DIAGNOSIS — M1711 Unilateral primary osteoarthritis, right knee: Secondary | ICD-10-CM | POA: Diagnosis not present

## 2023-08-09 DIAGNOSIS — R2689 Other abnormalities of gait and mobility: Secondary | ICD-10-CM | POA: Diagnosis not present

## 2023-08-09 DIAGNOSIS — M6281 Muscle weakness (generalized): Secondary | ICD-10-CM | POA: Diagnosis not present

## 2023-08-09 MED ORDER — METHOCARBAMOL 500 MG PO TABS: 500.0000 mg | ORAL_TABLET | Freq: Four times a day (QID) | ORAL | 1 refills | Status: DC | PRN

## 2023-08-09 NOTE — Therapy (Signed)
OUTPATIENT PHYSICAL THERAPY TREATMENT NOTE   Patient Name: Gloria [name] MRN: [id number] DOB:1951/05/25, 72 y.o., female Today's Date: 08/10/2023  END OF SESSION:  PT End of Session - 08/09/23 1359     Visit Number 3    Number of Visits 24    Date for PT Re-Evaluation 10/24/23    Authorization Type [employer] MCR    PT Start Time 1400    PT Stop Time 1440    PT Time Calculation (min) 40 min    Equipment Utilized During Treatment Gait belt    Activity Tolerance Patient limited by pain    Behavior During Therapy WFL for tasks assessed/performed               Past Medical History:  Diagnosis Date   Alopecia areata 10/2010 onset   Arthritis    Diabetes mellitus, type 2 (HCC)    no longer per pt   Dyslipidemia    Hyperlipidemia    on meds   Hypertension    on meds   Hypothyroid    on meds   Myasthenia gravis    Past Surgical History:  Procedure Laterality Date   ABDOMINAL HYSTERECTOMY  12/12/1988   CARPAL TUNNEL RELEASE Left    CARPAL TUNNEL RELEASE Right    COLONOSCOPY  2008   [facility]   TOTAL KNEE ARTHROPLASTY Right 07/14/2023   Procedure: RIGHT TOTAL KNEE ARTHROPLASTY;  Surgeon: Kathryne Hitch, MD;  Location: [facility];  Service: Orthopedics;  Laterality: Right;   Patient Active Problem List   Diagnosis Date Noted   Status post total right knee replacement 07/14/2023   CKD (chronic kidney disease) stage 3, GFR 30-59 ml/min (HCC) 03/08/2023   Arthropathy of lumbar facet joint 11/14/2022   Unilateral primary osteoarthritis, right knee 09/26/2022   Head injury 04/15/2022   Bilateral low back pain without sciatica 09/29/2021   Bradycardia 08/20/2020   Acute cervical radiculopathy 07/09/2020   Pruritus 02/06/2020   Raynaud's phenomenon 01/06/2020   Left wrist tendinitis 04/30/2018   Hand numbness 01/24/2018   Lumbar radiculopathy 12/23/2016   Plantar fasciitis of right foot 06/22/2016   Carpal tunnel syndrome of right wrist 03/07/2013   Myasthenia  gravis without exacerbation (HCC) 12/31/2010   Alopecia 12/31/2010   Hypothyroidism 12/29/2010   Type 2 diabetes, diet controlled (HCC) 12/29/2010   Hyperlipidemia 12/29/2010   Essential hypertension 12/29/2010   Coronary atherosclerosis 12/29/2010   Osteoarthritis 12/29/2010    PCP: Pincus Sanes, MD  REFERRING PROVIDER: Kathryne Hitch, MD  REFERRING DIAG:  [phone] (ICD-10-CM) - Status post total right knee replacement M17.11 (ICD-10-CM) - Unilateral primary osteoarthritis, right knee  THERAPY DIAG:  Acute pain of right knee  Muscle weakness (generalized)  Rationale for Evaluation and Treatment: Rehabilitation  ONSET DATE: 07/14/2023  SUBJECTIVE:   SUBJECTIVE STATEMENT: Pt presents to PT with reports of continued severe knee pain. Has been compliant with HEP per report.   EVAL: Pt presents to PT s/p R TKA performed by Dr. Magnus Ivan on 07/14/2023. Had been seeing HHPT for last few weeks since after surgery. Has had difficulty with stair navigation and notes general increase in pain today. States she doesn't feel like she can do much due to pain, not sure why today is uncomfortable. Denies pain in calf, PT educated on s/s of DVT.   PAIN:  Are you having pain?  Yes: NPRS scale: 10/10 Worst: 10/10 Pain location: Right knee Pain description: sore, post exercise Aggravating factors: walking, standing Relieving factors: rest,  ice  PERTINENT HISTORY: HTN, Myasthenia Gravis, DM II, CKD  PRECAUTIONS: None  RED FLAGS: None   WEIGHT BEARING RESTRICTIONS: No  FALLS:  Has patient fallen in last 6 months? No  LIVING ENVIRONMENT: Lives with: lives alone Lives in: House/apartment Stairs: Yes: Internal: 14 steps; on right going up and External: 2 steps; none Has following equipment at home: Environmental consultant - 2 wheeled  OCCUPATION: Retired  PLOF: Independent  PATIENT GOALS: improve right knee to be able to get back to walking and working out  OBJECTIVE:   DIAGNOSTIC  FINDINGS: See imaging  PATIENT SURVEYS:  FOTO: 26% function; 54% predicted  COGNITION: Overall cognitive status: Within functional limits for tasks assessed     SENSATION: Light touch: Impaired - lateral R LE  EDEMA:  DNT  POSTURE: rounded shoulders and forward head  PALPATION: TTP to distal R quad, patellar hypomobility  LOWER EXTREMITY ROM:  Active ROM Right eval Right 08/03/23 Right 08/09/23  Hip flexion     Hip extension     Hip abduction     Hip adduction     Hip internal rotation     Hip external rotation     Knee flexion 72 sitting 80d 75 (sitting)  Knee extension 19 15d 13  Ankle dorsiflexion     Ankle plantarflexion     Ankle inversion     Ankle eversion      (Blank rows = not tested)  LOWER EXTREMITY MMT:  MMT Right eval Left eval  Hip flexion    Hip extension    Hip abduction    Hip adduction    Hip internal rotation    Hip external rotation    Knee flexion DNT 4/5  Knee extension DNT 4/5  Ankle dorsiflexion    Ankle plantarflexion    Ankle inversion    Ankle eversion     (Blank rows = not tested)  LOWER EXTREMITY SPECIAL TESTS:  DNT  FUNCTIONAL TESTS:  TUG: 41 seconds with FWW 30 Second Sit to Stand: 4 reps with B UE  GAIT: Distance walked: 40ft Assistive device utilized: Environmental consultant - 2 wheeled Level of assistance: Modified independence Comments: antalgic gait R, decreased knee ext R  TREATMENT: [facility] Adult PT Treatment:                                                DATE: 08/10/23 Therapeutic Exercise: NuStep lvl 5 UE/LE x 4 min while taking subjective Seated heel slide 2x10 - 5" hold R LLLD extension stretch to R knee with ice and 2# x 3 min Seated hamstring stretch  3x30" R Supine heel slide with strap x 10 - 5" hold LAQ x 10 Standing knee flexion stretch on 6in step x 10 - 5" hold R  [facility] Adult PT Treatment:                                                DATE: 08/03/23 Therapeutic Exercise: Heel slides c slde disc  x5 Supine quad set x 5 - 5" hold Supine heel slide x 3  SLR x10 Supine hip abd x10 with slide disc Seated hamstring stretch  3x 30" R Supine ankle pumps x 20  [facility] Adult PT Treatment:  DATE: 07/31/2023 Therapeutic Exercise: Supine quad set x 5 - 5" hold Supine heel slide x 3 - pt stopped due to pain Seated hamstring stretch x 30" R Seated heel slide x 5 - 5" hold Supine ankle pumps x 20  PATIENT EDUCATION:  Education details: eval findings, FOTO, HEP, POC Person educated: Patient Education method: Explanation, Demonstration, and Handouts Education comprehension: verbalized understanding and returned demonstration  HOME EXERCISE PROGRAM: Access Code: [email or web address] URL: [email or web address] Date: 08/03/2023 Prepared by: Joellyn Rued  Exercises - Supine Quadricep Sets  - 4-5 x daily - 7 x weekly - 2 sets - 10 reps - 5 sec hold - Supine Heel Slide with Strap  - 4-5 x daily - 7 x weekly - 2 sets - 10 reps - 5 sec hold - Active Straight Leg Raise with Quad Set  - 4-5 x daily - 7 x weekly - 1 sets - 10 reps - 3 hold - Supine Knee Extension Strengthening  - 1 x daily - 7 x weekly - 1 sets - 10 reps - 3 hold - Seated Hamstring Stretch  - 4-5 x daily - 7 x weekly - 2 reps - 30 sec hold - Seated Heel Slide  - 4-5 x daily - 7 x weekly - 2 sets - 10 reps - 5 sec hold - Supine Ankle Pumps  - 4-5 x daily - 7 x weekly - 30 reps  ASSESSMENT:  CLINICAL IMPRESSION: Pt tolerated treatment fair but continues to self limit secondary to pain in R knee. Therapy focused on improving knee ROM and quad strength. Continues to require skilled PT services, will continue to progress as able per POC.   OBJECTIVE IMPAIRMENTS: Abnormal gait, decreased activity tolerance, decreased balance, decreased mobility, difficulty walking, decreased ROM, decreased strength, and pain.   ACTIVITY LIMITATIONS: sitting, standing, squatting, sleeping, stairs,  transfers, bed mobility, and locomotion level  PARTICIPATION LIMITATIONS: driving, shopping, community activity, occupation, and yard work  PERSONAL FACTORS: Fitness and 3+ comorbidities: HTN, Myasthenia Gravis, DM II, CKD  are also affecting patient's functional outcome.   REHAB POTENTIAL: Good  CLINICAL DECISION MAKING: Evolving/moderate complexity  EVALUATION COMPLEXITY: Moderate   GOALS: Goals reviewed with patient? No  SHORT TERM GOALS: Target date: 08/21/2023   Pt will be compliant and knowledgeable with initial HEP for improved comfort and carryover Baseline: initial HEP given  Goal status: INITIAL  2.  Pt will self report right knee pain no greater than 6/10 for improved comfort and functional ability Baseline: 10/10 at worst Goal status: INITIAL   LONG TERM GOALS: Target date: 10/24/2023   Pt will improve FOTO function score to no less than 54% as proxy for functional improvement Baseline: 26% function Goal status: INITIAL   2.  Pt will self report right knee pain no greater than 3/10 for improved comfort and functional ability Baseline: 10/10 at worst Goal status: INITIAL   3.  Pt will increase 30 Second Sit to Stand rep count to no less than 8 reps for improved balance, strength, and functional mobility Baseline: 4 reps  Goal status: INITIAL   4.  Pt will decrease TUG score to no greater than 18 seconds with least restrictive assistive device for improved safety and decreased fall risk Baseline: 41 seconds FWW Goal status: INITIAL  5.  Pt will improve right knee AROM to at least range of 3-110 degrees for improved comfort, mobility, and gait Baseline: see ROM chart Goal status: INITIAL  PLAN:  PT FREQUENCY: 2x/week  PT DURATION:  12 weeks  PLANNED INTERVENTIONS: Therapeutic exercises, Therapeutic activity, Neuromuscular re-education, Balance training, Gait training, Patient/Family education, Self Care, Joint mobilization, Dry Needling, Electrical  stimulation, Cryotherapy, Moist heat, Vasopneumatic device, Manual therapy, and Re-evaluation  PLAN FOR NEXT SESSION: assess HEP response, knee ROM, quad and hip strength, gait training   Eloy End PT  08/10/23 7:59 AM

## 2023-08-09 NOTE — Telephone Encounter (Signed)
Patient's daughter called and had lots of questions related to her mother feeling cold. She has stopped her aspirin as recommended by you. I encouraged her to eat iron rich foods and see if this improves. Also asked about refill of muscle relaxer. Got one yesterday of Zanaflex, but she said this is too strong for her and wanted to know if they could go back to Robaxin. Needs refill if so. Thank you.

## 2023-08-10 ENCOUNTER — Other Ambulatory Visit: Payer: Self-pay | Admitting: Family Medicine

## 2023-08-10 DIAGNOSIS — N3281 Overactive bladder: Secondary | ICD-10-CM

## 2023-08-14 NOTE — Therapy (Signed)
OUTPATIENT PHYSICAL THERAPY TREATMENT NOTE   Patient Name: Gloria [name] MRN: [id number] DOB:06/22/1951, 72 y.o., female Today's Date: 08/15/2023  END OF SESSION:  PT End of Session - 08/15/23 1513     Visit Number 4    Number of Visits 24    Date for PT Re-Evaluation 10/24/23    Authorization Type [employer] MCR    PT Start Time 1415    PT Stop Time 1505    PT Time Calculation (min) 50 min    Activity Tolerance Patient limited by pain    Behavior During Therapy [facility] for tasks assessed/performed                Past Medical History:  Diagnosis Date   Alopecia areata 10/2010 onset   Arthritis    Diabetes mellitus, type 2 (HCC)    no longer per pt   Dyslipidemia    Hyperlipidemia    on meds   Hypertension    on meds   Hypothyroid    on meds   Myasthenia gravis    Past Surgical History:  Procedure Laterality Date   ABDOMINAL HYSTERECTOMY  12/12/1988   CARPAL TUNNEL RELEASE Left    CARPAL TUNNEL RELEASE Right    COLONOSCOPY  2008   [facility]   TOTAL KNEE ARTHROPLASTY Right 07/14/2023   Procedure: RIGHT TOTAL KNEE ARTHROPLASTY;  Surgeon: Kathryne Hitch, MD;  Location: [facility];  Service: Orthopedics;  Laterality: Right;   Patient Active Problem List   Diagnosis Date Noted   Status post total right knee replacement 07/14/2023   CKD (chronic kidney disease) stage 3, GFR 30-59 ml/min (HCC) 03/08/2023   Arthropathy of lumbar facet joint 11/14/2022   Unilateral primary osteoarthritis, right knee 09/26/2022   Head injury 04/15/2022   Bilateral low back pain without sciatica 09/29/2021   Bradycardia 08/20/2020   Acute cervical radiculopathy 07/09/2020   Pruritus 02/06/2020   Raynaud's phenomenon 01/06/2020   Left wrist tendinitis 04/30/2018   Hand numbness 01/24/2018   Lumbar radiculopathy 12/23/2016   Plantar fasciitis of right foot 06/22/2016   Carpal tunnel syndrome of right wrist 03/07/2013   Myasthenia gravis without exacerbation (HCC) 12/31/2010    Alopecia 12/31/2010   Hypothyroidism 12/29/2010   Type 2 diabetes, diet controlled (HCC) 12/29/2010   Hyperlipidemia 12/29/2010   Essential hypertension 12/29/2010   Coronary atherosclerosis 12/29/2010   Osteoarthritis 12/29/2010    PCP: Pincus Sanes, MD  REFERRING PROVIDER: Kathryne Hitch, MD  REFERRING DIAG:  [phone] (ICD-10-CM) - Status post total right knee replacement M17.11 (ICD-10-CM) - Unilateral primary osteoarthritis, right knee  THERAPY DIAG:  Acute pain of right knee  Muscle weakness (generalized)  Other abnormalities of gait and mobility  Rationale for Evaluation and Treatment: Rehabilitation  ONSET DATE: 07/14/2023  SUBJECTIVE:   SUBJECTIVE STATEMENT: Pt continues to report significant R knee pain. Pt reports completing her HEP 3x daily.  EVAL: Pt presents to PT s/p R TKA performed by Dr. Magnus Ivan on 07/14/2023. Had been seeing HHPT for last few weeks since after surgery. Has had difficulty with stair navigation and notes general increase in pain today. States she doesn't feel like she can do much due to pain, not sure why today is uncomfortable. Denies pain in calf, PT educated on s/s of DVT.   PAIN:  Are you having pain?  Yes: NPRS scale: 10/10 Worst: 10/10 Pain location: Right knee Pain description: sore, post exercise Aggravating factors: walking, standing Relieving factors: rest, ice  PERTINENT HISTORY: HTN,  Myasthenia Gravis, DM II, CKD  PRECAUTIONS: None  RED FLAGS: None   WEIGHT BEARING RESTRICTIONS: No  FALLS:  Has patient fallen in last 6 months? No  LIVING ENVIRONMENT: Lives with: lives alone Lives in: House/apartment Stairs: Yes: Internal: 14 steps; on right going up and External: 2 steps; none Has following equipment at home: Environmental consultant - 2 wheeled  OCCUPATION: Retired  PLOF: Independent  PATIENT GOALS: improve right knee to be able to get back to walking and working out  OBJECTIVE:   DIAGNOSTIC FINDINGS: See  imaging  PATIENT SURVEYS:  FOTO: 26% function; 54% predicted  COGNITION: Overall cognitive status: Within functional limits for tasks assessed     SENSATION: Light touch: Impaired - lateral R LE  EDEMA:  DNT  POSTURE: rounded shoulders and forward head  PALPATION: TTP to distal R quad, patellar hypomobility  LOWER EXTREMITY ROM:  Active ROM Right eval Right 08/03/23 Right 08/09/23 Rt 08/15/23  Hip flexion      Hip extension      Hip abduction      Hip adduction      Hip internal rotation      Hip external rotation      Knee flexion 72 sitting 80d 75 (sitting) AA90d supine  Knee extension 19 15d 13   Ankle dorsiflexion      Ankle plantarflexion      Ankle inversion      Ankle eversion       (Blank rows = not tested)  LOWER EXTREMITY MMT:d  MMT Right eval Left eval  Hip flexion    Hip extension    Hip abduction    Hip adduction    Hip internal rotation    Hip external rotation    Knee flexion DNT 4/5  Knee extension DNT 4/5  Ankle dorsiflexion    Ankle plantarflexion    Ankle inversion    Ankle eversion     (Blank rows = not tested)  LOWER EXTREMITY SPECIAL TESTS:  DNT  FUNCTIONAL TESTS:  TUG: 41 seconds with FWW 30 Second Sit to Stand: 4 reps with B UE  GAIT: Distance walked: 50ft Assistive device utilized: Environmental consultant - 2 wheeled Level of assistance: Modified independence Comments: antalgic gait R, decreased knee ext R  TREATMENT: [facility] Adult PT Treatment:                                                DATE: 08/15/23 NuStep lvl 5 UE/LE x 8 min with progressive knee flexion while taking subjective Supine heel slide 2x10 - 5" hold R assist c strap Supine SAQ in available ext ROM x10  Supine SLR x10 Supine hip abd x10 Modalities: Colds pack to the R knee x10 mins  [facility] Adult PT Treatment:                                                DATE: 08/10/23 Therapeutic Exercise: NuStep lvl 5 UE/LE x 4 min while taking subjective Seated heel slide  2x10 - 5" hold R LLLD extension stretch to R knee with ice and 2# x 3 min Seated hamstring stretch  3x30" R Supine heel slide with strap x 10 - 5" hold LAQ x 10 Standing knee  flexion stretch on 6in step x 10 - 5" hold R  [facility] Adult PT Treatment:                                                DATE: 08/03/23 Therapeutic Exercise: Heel slides c slde disc x5 Supine quad set x 5 - 5" hold Supine heel slide x 3  SLR x10 Supine hip abd x10 with slide disc Seated hamstring stretch  3x 30" R Supine ankle pumps x 20  [facility] Adult PT Treatment:                                                DATE: 07/31/2023 Therapeutic Exercise: Supine quad set x 5 - 5" hold Supine heel slide x 3 - pt stopped due to pain Seated hamstring stretch x 30" R Seated heel slide x 5 - 5" hold Supine ankle pumps x 20  PATIENT EDUCATION:  Education details: eval findings, FOTO, HEP, POC Person educated: Patient Education method: Explanation, Demonstration, and Handouts Education comprehension: verbalized understanding and returned demonstration  HOME EXERCISE PROGRAM: Access Code: [email or web address] URL: [email or web address] Date: 08/03/2023 Prepared by: Joellyn Rued  Exercises - Supine Quadricep Sets  - 4-5 x daily - 7 x weekly - 2 sets - 10 reps - 5 sec hold - Supine Heel Slide with Strap  - 4-5 x daily - 7 x weekly - 2 sets - 10 reps - 5 sec hold - Active Straight Leg Raise with Quad Set  - 4-5 x daily - 7 x weekly - 1 sets - 10 reps - 3 hold - Supine Knee Extension Strengthening  - 1 x daily - 7 x weekly - 1 sets - 10 reps - 3 hold - Seated Hamstring Stretch  - 4-5 x daily - 7 x weekly - 2 reps - 30 sec hold - Seated Heel Slide  - 4-5 x daily - 7 x weekly - 2 sets - 10 reps - 5 sec hold - Supine Ankle Pumps  - 4-5 x daily - 7 x weekly - 30 reps  ASSESSMENT:  CLINICAL IMPRESSION: PT was completed for R knee ROM and strengthening as tolerated. Pt's ability to participate in therex is decreased due to  pain. Overall, pt is slowly making improvement with ROM, strength, and function. Pt's walking pace appears increased per observation. Continues to require skilled PT services, will continue to progress as able per POC.   OBJECTIVE IMPAIRMENTS: Abnormal gait, decreased activity tolerance, decreased balance, decreased mobility, difficulty walking, decreased ROM, decreased strength, and pain.   ACTIVITY LIMITATIONS: sitting, standing, squatting, sleeping, stairs, transfers, bed mobility, and locomotion level  PARTICIPATION LIMITATIONS: driving, shopping, community activity, occupation, and yard work  PERSONAL FACTORS: Fitness and 3+ comorbidities: HTN, Myasthenia Gravis, DM II, CKD  are also affecting patient's functional outcome.   REHAB POTENTIAL: Good  CLINICAL DECISION MAKING: Evolving/moderate complexity  EVALUATION COMPLEXITY: Moderate   GOALS: Goals reviewed with patient? No  SHORT TERM GOALS: Target date: 08/21/2023   Pt will be compliant and knowledgeable with initial HEP for improved comfort and carryover Baseline: initial HEP given  Goal status: INITIAL  2.  Pt will self report right knee pain no greater than 6/10 for improved comfort  and functional ability Baseline: 10/10 at worst Goal status: INITIAL   LONG TERM GOALS: Target date: 10/24/2023   Pt will improve FOTO function score to no less than 54% as proxy for functional improvement Baseline: 26% function Goal status: INITIAL   2.  Pt will self report right knee pain no greater than 3/10 for improved comfort and functional ability Baseline: 10/10 at worst Goal status: INITIAL   3.  Pt will increase 30 Second Sit to Stand rep count to no less than 8 reps for improved balance, strength, and functional mobility Baseline: 4 reps  Goal status: INITIAL   4.  Pt will decrease TUG score to no greater than 18 seconds with least restrictive assistive device for improved safety and decreased fall risk Baseline: 41 seconds  FWW Goal status: INITIAL  5.  Pt will improve right knee AROM to at least range of 3-110 degrees for improved comfort, mobility, and gait Baseline: see ROM chart Goal status: INITIAL  PLAN:  PT FREQUENCY: 2x/week  PT DURATION: 12 weeks  PLANNED INTERVENTIONS: Therapeutic exercises, Therapeutic activity, Neuromuscular re-education, Balance training, Gait training, Patient/Family education, Self Care, Joint mobilization, Dry Needling, Electrical stimulation, Cryotherapy, Moist heat, Vasopneumatic device, Manual therapy, and Re-evaluation  PLAN FOR NEXT SESSION: assess HEP response, knee ROM, quad and hip strength, gait training    Chakira Jachim MS, PT 08/15/23 3:16 PM

## 2023-08-15 ENCOUNTER — Other Ambulatory Visit: Payer: Self-pay | Admitting: Internal Medicine

## 2023-08-15 ENCOUNTER — Ambulatory Visit: Payer: Medicare HMO | Attending: Orthopaedic Surgery

## 2023-08-15 ENCOUNTER — Telehealth: Payer: Self-pay | Admitting: *Deleted

## 2023-08-15 ENCOUNTER — Other Ambulatory Visit: Payer: Self-pay | Admitting: Orthopaedic Surgery

## 2023-08-15 DIAGNOSIS — M6281 Muscle weakness (generalized): Secondary | ICD-10-CM | POA: Diagnosis not present

## 2023-08-15 DIAGNOSIS — M25561 Pain in right knee: Secondary | ICD-10-CM | POA: Diagnosis not present

## 2023-08-15 DIAGNOSIS — R2689 Other abnormalities of gait and mobility: Secondary | ICD-10-CM

## 2023-08-15 MED ORDER — OXYCODONE HCL 5 MG PO TABS: 5.0000 mg | ORAL_TABLET | Freq: Four times a day (QID) | ORAL | 0 refills | Status: DC | PRN

## 2023-08-15 NOTE — Therapy (Signed)
OUTPATIENT PHYSICAL THERAPY TREATMENT NOTE   Patient Name: EILYN MATO MRN: [id number] DOB:[birth year], 72 y.o., female Today's Date: 08/17/2023  END OF SESSION:  PT End of Session - 08/17/23 1338     Visit Number 5    Number of Visits 24    Date for PT Re-Evaluation 10/24/23    Authorization Type [employer] MCR    PT Start Time 1330    PT Stop Time 1420    PT Time Calculation (min) 50 min    Activity Tolerance Patient limited by pain    Behavior During Therapy [facility] for tasks assessed/performed                 Past Medical History:  Diagnosis Date   Alopecia areata 10/2010 onset   Arthritis    Diabetes mellitus, type 2 (HCC)    no longer per pt   Dyslipidemia    Hyperlipidemia    on meds   Hypertension    on meds   Hypothyroid    on meds   Myasthenia gravis    Past Surgical History:  Procedure Laterality Date   ABDOMINAL HYSTERECTOMY  12/12/1988   CARPAL TUNNEL RELEASE Left    CARPAL TUNNEL RELEASE Right    COLONOSCOPY  2008   [facility]   TOTAL KNEE ARTHROPLASTY Right 07/14/2023   Procedure: RIGHT TOTAL KNEE ARTHROPLASTY;  Surgeon: Kathryne Hitch, MD;  Location: [facility];  Service: Orthopedics;  Laterality: Right;   Patient Active Problem List   Diagnosis Date Noted   Status post total right knee replacement 07/14/2023   CKD (chronic kidney disease) stage 3, GFR 30-59 ml/min (HCC) 03/08/2023   Arthropathy of lumbar facet joint 11/14/2022   Unilateral primary osteoarthritis, right knee 09/26/2022   Head injury 04/15/2022   Bilateral low back pain without sciatica 09/29/2021   Bradycardia 08/20/2020   Acute cervical radiculopathy 07/09/2020   Pruritus 02/06/2020   Raynaud's phenomenon 01/06/2020   Left wrist tendinitis 04/30/2018   Hand numbness 01/24/2018   Lumbar radiculopathy 12/23/2016   Plantar fasciitis of right foot 06/22/2016   Carpal tunnel syndrome of right wrist 03/07/2013   Myasthenia gravis without exacerbation (HCC) 12/31/2010    Alopecia 12/31/2010   Hypothyroidism 12/29/2010   Type 2 diabetes, diet controlled (HCC) 12/29/2010   Hyperlipidemia 12/29/2010   Essential hypertension 12/29/2010   Coronary atherosclerosis 12/29/2010   Osteoarthritis 12/29/2010    PCP: Pincus Sanes, MD  REFERRING PROVIDER: Kathryne Hitch, MD  REFERRING DIAG:  [phone] (ICD-10-CM) - Status post total right knee replacement M17.11 (ICD-10-CM) - Unilateral primary osteoarthritis, right knee  THERAPY DIAG:  Acute pain of right knee  Muscle weakness (generalized)  Other abnormalities of gait and mobility  Rationale for Evaluation and Treatment: Rehabilitation  ONSET DATE: 07/14/2023  SUBJECTIVE:   SUBJECTIVE STATEMENT: Pt reports the R knee is hurting a lot today.Marland Kitchen  EVAL: Pt presents to PT s/p R TKA performed by Dr. Magnus Ivan on 07/14/2023. Had been seeing HHPT for last few weeks since after surgery. Has had difficulty with stair navigation and notes general increase in pain today. States she doesn't feel like she can do much due to pain, not sure why today is uncomfortable. Denies pain in calf, PT educated on s/s of DVT.   PAIN:  Are you having pain?  Yes: NPRS scale: 8/10 Worst: 10/10 Pain location: Right knee Pain description: sore, post exercise Aggravating factors: walking, standing Relieving factors: rest, ice  PERTINENT HISTORY: HTN, Myasthenia Gravis, DM II,  CKD  PRECAUTIONS: None  RED FLAGS: None   WEIGHT BEARING RESTRICTIONS: No  FALLS:  Has patient fallen in last 6 months? No  LIVING ENVIRONMENT: Lives with: lives alone Lives in: House/apartment Stairs: Yes: Internal: 14 steps; on right going up and External: 2 steps; none Has following equipment at home: Environmental consultant - 2 wheeled  OCCUPATION: Retired  PLOF: Independent  PATIENT GOALS: improve right knee to be able to get back to walking and working out  OBJECTIVE:   DIAGNOSTIC FINDINGS: See imaging  PATIENT SURVEYS:  FOTO: 26% function;  54% predicted  COGNITION: Overall cognitive status: Within functional limits for tasks assessed     SENSATION: Light touch: Impaired - lateral R LE  EDEMA:  DNT  POSTURE: rounded shoulders and forward head  PALPATION: TTP to distal R quad, patellar hypomobility  LOWER EXTREMITY ROM:  Active ROM Right eval Right 08/03/23 Right 08/09/23 Rt 08/15/23 Rt 9/524  Hip flexion       Hip extension       Hip abduction       Hip adduction       Hip internal rotation       Hip external rotation       Knee flexion 72 sitting 80d 75 (sitting) AA90d supine AA90d supine  Knee extension 19 15d 13  13d  Ankle dorsiflexion       Ankle plantarflexion       Ankle inversion       Ankle eversion        (Blank rows = not tested)  LOWER EXTREMITY MMT:d  MMT Right eval Left eval  Hip flexion    Hip extension    Hip abduction    Hip adduction    Hip internal rotation    Hip external rotation    Knee flexion DNT 4/5  Knee extension DNT 4/5  Ankle dorsiflexion    Ankle plantarflexion    Ankle inversion    Ankle eversion     (Blank rows = not tested)  LOWER EXTREMITY SPECIAL TESTS:  DNT  FUNCTIONAL TESTS:  TUG: 41 seconds with FWW 30 Second Sit to Stand: 4 reps with B UE  GAIT: Distance walked: 50ft Assistive device utilized: Environmental consultant - 2 wheeled Level of assistance: Modified independence Comments: antalgic gait R, decreased knee ext R  TREATMENT: [facility] Adult PT Treatment:                                                DATE: 08/17/23 Therapeutic Exercise: NuStep lvl 5 UE/LE x 8 min with progressive knee flexion while taking subjective Supine heel slide 2x10 - 5" hold R assist c strap Supine SAQ in available ext ROM x10  Supine SLR x10 Supine hip abd x10 Modalities: Cold pack to the R knee x10 min Manual Therapy: Grade 2 patellar mobs and tib/fem AP and PA mobs, limited by pain  [facility] Adult PT Treatment:                                                DATE:  08/15/23 NuStep lvl 5 UE/LE x 8 min with progressive knee flexion while taking subjective Supine heel slide 2x10 - 5" hold R assist c strap Supine  SAQ in available ext ROM x10  Supine SLR x10 Supine hip abd x10 Modalities: Colds pack to the R knee x10 mins  [facility] Adult PT Treatment:                                                DATE: 08/10/23 Therapeutic Exercise: NuStep lvl 5 UE/LE x 4 min while taking subjective Seated heel slide 2x10 - 5" hold R LLLD extension stretch to R knee with ice and 2# x 3 min Seated hamstring stretch  3x30" R Supine heel slide with strap x 10 - 5" hold LAQ x 10 Standing knee flexion stretch on 6in step x 10 - 5" hold R  [facility] Adult PT Treatment:                                                DATE: 08/03/23 Therapeutic Exercise: Heel slides c slde disc x5 Supine quad set x 5 - 5" hold Supine heel slide x 3  SLR x10 Supine hip abd x10 with slide disc Seated hamstring stretch  3x 30" R Supine ankle pumps x 20  PATIENT EDUCATION:  Education details: eval findings, FOTO, HEP, POC Person educated: Patient Education method: Explanation, Demonstration, and Handouts Education comprehension: verbalized understanding and returned demonstration  HOME EXERCISE PROGRAM: Access Code: [email or web address] URL: [email or web address] Date: 08/03/2023 Prepared by: Joellyn Rued  Exercises - Supine Quadricep Sets  - 4-5 x daily - 7 x weekly - 2 sets - 10 reps - 5 sec hold - Supine Heel Slide with Strap  - 4-5 x daily - 7 x weekly - 2 sets - 10 reps - 5 sec hold - Active Straight Leg Raise with Quad Set  - 4-5 x daily - 7 x weekly - 1 sets - 10 reps - 3 hold - Supine Knee Extension Strengthening  - 1 x daily - 7 x weekly - 1 sets - 10 reps - 3 hold - Seated Hamstring Stretch  - 4-5 x daily - 7 x weekly - 2 reps - 30 sec hold - Seated Heel Slide  - 4-5 x daily - 7 x weekly - 2 sets - 10 reps - 5 sec hold - Supine Ankle Pumps  - 4-5 x daily - 7 x weekly - 30  reps  ASSESSMENT:  CLINICAL IMPRESSION: PT was completed for R knee ROM and strengthening as tolerated. Pt's ability to complete therex is limited by pain. Pt tolerated today's prescribed therex without adverse effects. A cold pack was applied at the EOS for symptom management. Continues to require skilled PT services, will continue to progress as able per POC.   OBJECTIVE IMPAIRMENTS: Abnormal gait, decreased activity tolerance, decreased balance, decreased mobility, difficulty walking, decreased ROM, decreased strength, and pain.   ACTIVITY LIMITATIONS: sitting, standing, squatting, sleeping, stairs, transfers, bed mobility, and locomotion level  PARTICIPATION LIMITATIONS: driving, shopping, community activity, occupation, and yard work  PERSONAL FACTORS: Fitness and 3+ comorbidities: HTN, Myasthenia Gravis, DM II, CKD  are also affecting patient's functional outcome.   REHAB POTENTIAL: Good  CLINICAL DECISION MAKING: Evolving/moderate complexity  EVALUATION COMPLEXITY: Moderate   GOALS: Goals reviewed with patient? No  SHORT TERM GOALS: Target date: 08/21/2023   Pt will be  compliant and knowledgeable with initial HEP for improved comfort and carryover Baseline: initial HEP given  Goal status: INITIAL  2.  Pt will self report right knee pain no greater than 6/10 for improved comfort and functional ability Baseline: 10/10 at worst Goal status: INITIAL   LONG TERM GOALS: Target date: 10/24/2023   Pt will improve FOTO function score to no less than 54% as proxy for functional improvement Baseline: 26% function Goal status: INITIAL   2.  Pt will self report right knee pain no greater than 3/10 for improved comfort and functional ability Baseline: 10/10 at worst Goal status: INITIAL   3.  Pt will increase 30 Second Sit to Stand rep count to no less than 8 reps for improved balance, strength, and functional mobility Baseline: 4 reps  Goal status: INITIAL   4.  Pt will  decrease TUG score to no greater than 18 seconds with least restrictive assistive device for improved safety and decreased fall risk Baseline: 41 seconds FWW Goal status: INITIAL  5.  Pt will improve right knee AROM to at least range of 3-110 degrees for improved comfort, mobility, and gait Baseline: see ROM chart Goal status: INITIAL  PLAN:  PT FREQUENCY: 2x/week  PT DURATION: 12 weeks  PLANNED INTERVENTIONS: Therapeutic exercises, Therapeutic activity, Neuromuscular re-education, Balance training, Gait training, Patient/Family education, Self Care, Joint mobilization, Dry Needling, Electrical stimulation, Cryotherapy, Moist heat, Vasopneumatic device, Manual therapy, and Re-evaluation  PLAN FOR NEXT SESSION: assess HEP response, knee ROM, quad and hip strength, gait training    Tessi Eustache MS, PT 08/17/23 2:58 PM

## 2023-08-15 NOTE — Telephone Encounter (Signed)
Patient requesting refill of pain medication. Thank you.

## 2023-08-16 NOTE — Telephone Encounter (Signed)
Sent in by MD.

## 2023-08-17 ENCOUNTER — Ambulatory Visit: Payer: Medicare HMO

## 2023-08-17 DIAGNOSIS — R2689 Other abnormalities of gait and mobility: Secondary | ICD-10-CM

## 2023-08-17 DIAGNOSIS — M25561 Pain in right knee: Secondary | ICD-10-CM | POA: Diagnosis not present

## 2023-08-17 DIAGNOSIS — M6281 Muscle weakness (generalized): Secondary | ICD-10-CM | POA: Diagnosis not present

## 2023-08-21 NOTE — Therapy (Incomplete)
OUTPATIENT PHYSICAL THERAPY TREATMENT NOTE   Patient Name: YOLONDA RYMAN MRN: [id number] DOB:1951/05/15, 72 y.o., female Today's Date: 08/21/2023  END OF SESSION:        Past Medical History:  Diagnosis Date   Alopecia areata 10/2010 onset   Arthritis    Diabetes mellitus, type 2 (HCC)    no longer per pt   Dyslipidemia    Hyperlipidemia    on meds   Hypertension    on meds   Hypothyroid    on meds   Myasthenia gravis    Past Surgical History:  Procedure Laterality Date   ABDOMINAL HYSTERECTOMY  12/12/1988   CARPAL TUNNEL RELEASE Left    CARPAL TUNNEL RELEASE Right    COLONOSCOPY  2008   [facility]   TOTAL KNEE ARTHROPLASTY Right 07/14/2023   Procedure: RIGHT TOTAL KNEE ARTHROPLASTY;  Surgeon: Kathryne Hitch, MD;  Location: [facility];  Service: Orthopedics;  Laterality: Right;   Patient Active Problem List   Diagnosis Date Noted   Status post total right knee replacement 07/14/2023   CKD (chronic kidney disease) stage 3, GFR 30-59 ml/min (HCC) 03/08/2023   Arthropathy of lumbar facet joint 11/14/2022   Unilateral primary osteoarthritis, right knee 09/26/2022   Head injury 04/15/2022   Bilateral low back pain without sciatica 09/29/2021   Bradycardia 08/20/2020   Acute cervical radiculopathy 07/09/2020   Pruritus 02/06/2020   Raynaud's phenomenon 01/06/2020   Left wrist tendinitis 04/30/2018   Hand numbness 01/24/2018   Lumbar radiculopathy 12/23/2016   Plantar fasciitis of right foot 06/22/2016   Carpal tunnel syndrome of right wrist 03/07/2013   Myasthenia gravis without exacerbation (HCC) 12/31/2010   Alopecia 12/31/2010   Hypothyroidism 12/29/2010   Type 2 diabetes, diet controlled (HCC) 12/29/2010   Hyperlipidemia 12/29/2010   Essential hypertension 12/29/2010   Coronary atherosclerosis 12/29/2010   Osteoarthritis 12/29/2010    PCP: Pincus Sanes, MD  REFERRING PROVIDER: Kathryne Hitch, MD  REFERRING DIAG:  [phone] (ICD-10-CM) -  Status post total right knee replacement M17.11 (ICD-10-CM) - Unilateral primary osteoarthritis, right knee  THERAPY DIAG:  No diagnosis found.  Rationale for Evaluation and Treatment: Rehabilitation  ONSET DATE: 07/14/2023  SUBJECTIVE:   SUBJECTIVE STATEMENT: Pt reports the R knee is hurting a lot today.Marland Kitchen  EVAL: Pt presents to PT s/p R TKA performed by Dr. Magnus Ivan on 07/14/2023. Had been seeing HHPT for last few weeks since after surgery. Has had difficulty with stair navigation and notes general increase in pain today. States she doesn't feel like she can do much due to pain, not sure why today is uncomfortable. Denies pain in calf, PT educated on s/s of DVT.   PAIN:  Are you having pain?  Yes: NPRS scale: 8/10 Worst: 10/10 Pain location: Right knee Pain description: sore, post exercise Aggravating factors: walking, standing Relieving factors: rest, ice  PERTINENT HISTORY: HTN, Myasthenia Gravis, DM II, CKD  PRECAUTIONS: None  RED FLAGS: None   WEIGHT BEARING RESTRICTIONS: No  FALLS:  Has patient fallen in last 6 months? No  LIVING ENVIRONMENT: Lives with: lives alone Lives in: House/apartment Stairs: Yes: Internal: 14 steps; on right going up and External: 2 steps; none Has following equipment at home: Dan Humphreys - 2 wheeled  OCCUPATION: Retired  PLOF: Independent  PATIENT GOALS: improve right knee to be able to get back to walking and working out  OBJECTIVE:   DIAGNOSTIC FINDINGS: See imaging  PATIENT SURVEYS:  FOTO: 26% function; 54% predicted  COGNITION: Overall  cognitive status: Within functional limits for tasks assessed     SENSATION: Light touch: Impaired - lateral R LE  EDEMA:  DNT  POSTURE: rounded shoulders and forward head  PALPATION: TTP to distal R quad, patellar hypomobility  LOWER EXTREMITY ROM:  Active ROM Right eval Right 08/03/23 Right 08/09/23 Rt 08/15/23 Rt 9/524  Hip flexion       Hip extension       Hip abduction        Hip adduction       Hip internal rotation       Hip external rotation       Knee flexion 72 sitting 80d 75 (sitting) AA90d supine AA90d supine  Knee extension 19 15d 13  13d  Ankle dorsiflexion       Ankle plantarflexion       Ankle inversion       Ankle eversion        (Blank rows = not tested)  LOWER EXTREMITY MMT:d  MMT Right eval Left eval  Hip flexion    Hip extension    Hip abduction    Hip adduction    Hip internal rotation    Hip external rotation    Knee flexion DNT 4/5  Knee extension DNT 4/5  Ankle dorsiflexion    Ankle plantarflexion    Ankle inversion    Ankle eversion     (Blank rows = not tested)  LOWER EXTREMITY SPECIAL TESTS:  DNT  FUNCTIONAL TESTS:  TUG: 41 seconds with FWW 30 Second Sit to Stand: 4 reps with B UE  GAIT: Distance walked: 15ft Assistive device utilized: Environmental consultant - 2 wheeled Level of assistance: Modified independence Comments: antalgic gait R, decreased knee ext R  TREATMENT: [facility] Adult PT Treatment:                                                DATE: 08/22/23 Therapeutic Exercise: *** Manual Therapy: *** Neuromuscular re-ed: *** Therapeutic Activity: *** Modalities: *** Self Care: ***   [facility] Treatment:                                                DATE: 08/17/23 Therapeutic Exercise: NuStep lvl 5 UE/LE x 8 min with progressive knee flexion while taking subjective Supine heel slide 2x10 - 5" hold R assist c strap Supine SAQ in available ext ROM x10  Supine SLR x10 Supine hip abd x10 Modalities: Cold pack to the R knee x10 min Manual Therapy: Grade 2 patellar mobs and tib/fem AP and PA mobs, limited by pain  [facility] Adult PT Treatment:                                                DATE: 08/15/23 NuStep lvl 5 UE/LE x 8 min with progressive knee flexion while taking subjective Supine heel slide 2x10 - 5" hold R assist c strap Supine SAQ in available ext ROM x10  Supine SLR x10 Supine hip abd  x10 Modalities: Colds pack to the R knee x10 mins  [facility] Adult PT Treatment:  DATE: 08/10/23 Therapeutic Exercise: NuStep lvl 5 UE/LE x 4 min while taking subjective Seated heel slide 2x10 - 5" hold R LLLD extension stretch to R knee with ice and 2# x 3 min Seated hamstring stretch  3x30" R Supine heel slide with strap x 10 - 5" hold LAQ x 10 Standing knee flexion stretch on 6in step x 10 - 5" hold R  [facility] Adult PT Treatment:                                                DATE: 08/03/23 Therapeutic Exercise: Heel slides c slde disc x5 Supine quad set x 5 - 5" hold Supine heel slide x 3  SLR x10 Supine hip abd x10 with slide disc Seated hamstring stretch  3x 30" R Supine ankle pumps x 20  PATIENT EDUCATION:  Education details: eval findings, FOTO, HEP, POC Person educated: Patient Education method: Explanation, Demonstration, and Handouts Education comprehension: verbalized understanding and returned demonstration  HOME EXERCISE PROGRAM: Access Code: [email or web address] URL: [email or web address] Date: 08/03/2023 Prepared by: Joellyn Rued  Exercises - Supine Quadricep Sets  - 4-5 x daily - 7 x weekly - 2 sets - 10 reps - 5 sec hold - Supine Heel Slide with Strap  - 4-5 x daily - 7 x weekly - 2 sets - 10 reps - 5 sec hold - Active Straight Leg Raise with Quad Set  - 4-5 x daily - 7 x weekly - 1 sets - 10 reps - 3 hold - Supine Knee Extension Strengthening  - 1 x daily - 7 x weekly - 1 sets - 10 reps - 3 hold - Seated Hamstring Stretch  - 4-5 x daily - 7 x weekly - 2 reps - 30 sec hold - Seated Heel Slide  - 4-5 x daily - 7 x weekly - 2 sets - 10 reps - 5 sec hold - Supine Ankle Pumps  - 4-5 x daily - 7 x weekly - 30 reps  ASSESSMENT:  CLINICAL IMPRESSION: PT was completed for R knee ROM and strengthening as tolerated. Pt's ability to complete therex is limited by pain. Pt tolerated today's prescribed therex without adverse  effects. A cold pack was applied at the EOS for symptom management. Continues to require skilled PT services, will continue to progress as able per POC.   OBJECTIVE IMPAIRMENTS: Abnormal gait, decreased activity tolerance, decreased balance, decreased mobility, difficulty walking, decreased ROM, decreased strength, and pain.   ACTIVITY LIMITATIONS: sitting, standing, squatting, sleeping, stairs, transfers, bed mobility, and locomotion level  PARTICIPATION LIMITATIONS: driving, shopping, community activity, occupation, and yard work  PERSONAL FACTORS: Fitness and 3+ comorbidities: HTN, Myasthenia Gravis, DM II, CKD  are also affecting patient's functional outcome.   REHAB POTENTIAL: Good  CLINICAL DECISION MAKING: Evolving/moderate complexity  EVALUATION COMPLEXITY: Moderate   GOALS: Goals reviewed with patient? No  SHORT TERM GOALS: Target date: 08/21/2023   Pt will be compliant and knowledgeable with initial HEP for improved comfort and carryover Baseline: initial HEP given  Goal status: INITIAL  2.  Pt will self report right knee pain no greater than 6/10 for improved comfort and functional ability Baseline: 10/10 at worst Goal status: INITIAL   LONG TERM GOALS: Target date: 10/24/2023   Pt will improve FOTO function score to no less than 54% as proxy for functional improvement Baseline: 26% function  Goal status: INITIAL   2.  Pt will self report right knee pain no greater than 3/10 for improved comfort and functional ability Baseline: 10/10 at worst Goal status: INITIAL   3.  Pt will increase 30 Second Sit to Stand rep count to no less than 8 reps for improved balance, strength, and functional mobility Baseline: 4 reps  Goal status: INITIAL   4.  Pt will decrease TUG score to no greater than 18 seconds with least restrictive assistive device for improved safety and decreased fall risk Baseline: 41 seconds FWW Goal status: INITIAL  5.  Pt will improve right knee AROM  to at least range of 3-110 degrees for improved comfort, mobility, and gait Baseline: see ROM chart Goal status: INITIAL  PLAN:  PT FREQUENCY: 2x/week  PT DURATION: 12 weeks  PLANNED INTERVENTIONS: Therapeutic exercises, Therapeutic activity, Neuromuscular re-education, Balance training, Gait training, Patient/Family education, Self Care, Joint mobilization, Dry Needling, Electrical stimulation, Cryotherapy, Moist heat, Vasopneumatic device, Manual therapy, and Re-evaluation  PLAN FOR NEXT SESSION: assess HEP response, knee ROM, quad and hip strength, gait training    Ivory Maduro MS, PT 08/21/23 10:05 PM

## 2023-08-22 ENCOUNTER — Other Ambulatory Visit: Payer: Self-pay | Admitting: Orthopaedic Surgery

## 2023-08-22 ENCOUNTER — Ambulatory Visit: Payer: Medicare HMO | Admitting: Physical Therapy

## 2023-08-22 ENCOUNTER — Telehealth: Payer: Self-pay | Admitting: *Deleted

## 2023-08-22 ENCOUNTER — Encounter: Payer: Self-pay | Admitting: Physical Therapy

## 2023-08-22 DIAGNOSIS — M6281 Muscle weakness (generalized): Secondary | ICD-10-CM | POA: Diagnosis not present

## 2023-08-22 DIAGNOSIS — M25561 Pain in right knee: Secondary | ICD-10-CM

## 2023-08-22 DIAGNOSIS — R2689 Other abnormalities of gait and mobility: Secondary | ICD-10-CM | POA: Diagnosis not present

## 2023-08-22 MED ORDER — OXYCODONE HCL 5 MG PO TABS: 5.0000 mg | ORAL_TABLET | Freq: Four times a day (QID) | ORAL | 0 refills | Status: DC | PRN

## 2023-08-22 NOTE — Telephone Encounter (Signed)
Patient's daughter called on her behalf requesting refill of pain medication. Thank you.

## 2023-08-22 NOTE — Therapy (Signed)
OUTPATIENT PHYSICAL THERAPY TREATMENT NOTE   Patient Name: Gloria [name] MRN: [id number] DOB:1951/09/25, 72 y.o., female Today's Date: 08/22/2023  END OF SESSION:  PT End of Session - 08/22/23 0942     Visit Number 6    Number of Visits 24    Date for PT Re-Evaluation 10/24/23    Authorization Type [employer] MCR    PT Start Time 0932    PT Stop Time 1025    PT Time Calculation (min) 53 min                 Past Medical History:  Diagnosis Date   Alopecia areata 10/2010 onset   Arthritis    Diabetes mellitus, type 2 (HCC)    no longer per pt   Dyslipidemia    Hyperlipidemia    on meds   Hypertension    on meds   Hypothyroid    on meds   Myasthenia gravis    Past Surgical History:  Procedure Laterality Date   ABDOMINAL HYSTERECTOMY  12/12/1988   CARPAL TUNNEL RELEASE Left    CARPAL TUNNEL RELEASE Right    COLONOSCOPY  2008   [facility]   TOTAL KNEE ARTHROPLASTY Right 07/14/2023   Procedure: RIGHT TOTAL KNEE ARTHROPLASTY;  Surgeon: Kathryne Hitch, MD;  Location: [facility];  Service: Orthopedics;  Laterality: Right;   Patient Active Problem List   Diagnosis Date Noted   Status post total right knee replacement 07/14/2023   CKD (chronic kidney disease) stage 3, GFR 30-59 ml/min (HCC) 03/08/2023   Arthropathy of lumbar facet joint 11/14/2022   Unilateral primary osteoarthritis, right knee 09/26/2022   Head injury 04/15/2022   Bilateral low back pain without sciatica 09/29/2021   Bradycardia 08/20/2020   Acute cervical radiculopathy 07/09/2020   Pruritus 02/06/2020   Raynaud's phenomenon 01/06/2020   Left wrist tendinitis 04/30/2018   Hand numbness 01/24/2018   Lumbar radiculopathy 12/23/2016   Plantar fasciitis of right foot 06/22/2016   Carpal tunnel syndrome of right wrist 03/07/2013   Myasthenia gravis without exacerbation (HCC) 12/31/2010   Alopecia 12/31/2010   Hypothyroidism 12/29/2010   Type 2 diabetes, diet controlled (HCC) 12/29/2010    Hyperlipidemia 12/29/2010   Essential hypertension 12/29/2010   Coronary atherosclerosis 12/29/2010   Osteoarthritis 12/29/2010    PCP: Pincus Sanes, MD  REFERRING PROVIDER: Kathryne Hitch, MD  REFERRING DIAG:  [id number] (ICD-10-CM) - Status post total right knee replacement M17.11 (ICD-10-CM) - Unilateral primary osteoarthritis, right knee  THERAPY DIAG:  Acute pain of right knee  Muscle weakness (generalized)  Other abnormalities of gait and mobility  Rationale for Evaluation and Treatment: Rehabilitation  ONSET DATE: 07/14/2023  SUBJECTIVE:   SUBJECTIVE STATEMENT: Pt reports the R knee is getting better.   EVAL: Pt presents to PT s/p R TKA performed by Dr. Magnus Ivan on 07/14/2023. Had been seeing HHPT for last few weeks since after surgery. Has had difficulty with stair navigation and notes general increase in pain today. States she doesn't feel like she can do much due to pain, not sure why today is uncomfortable. Denies pain in calf, PT educated on s/s of DVT.   PAIN:  Are you having pain?  Yes: NPRS scale: 6-7/10 Worst: 10/10 Pain location: Right knee Pain description: sore, post exercise Aggravating factors: walking, standing Relieving factors: rest, ice  PERTINENT HISTORY: HTN, Myasthenia Gravis, DM II, CKD  PRECAUTIONS: None  RED FLAGS: None   WEIGHT BEARING RESTRICTIONS: No  FALLS:  Has patient fallen  in last 6 months? No  LIVING ENVIRONMENT: Lives with: lives alone Lives in: House/apartment Stairs: Yes: Internal: 14 steps; on right going up and External: 2 steps; none Has following equipment at home: Environmental consultant - 2 wheeled  OCCUPATION: Retired  PLOF: Independent  PATIENT GOALS: improve right knee to be able to get back to walking and working out  OBJECTIVE:   DIAGNOSTIC FINDINGS: See imaging  PATIENT SURVEYS:  FOTO: 26% function; 54% predicted  COGNITION: Overall cognitive status: Within functional limits for tasks  assessed     SENSATION: Light touch: Impaired - lateral R LE  EDEMA:  DNT  POSTURE: rounded shoulders and forward head  PALPATION: TTP to distal R quad, patellar hypomobility  LOWER EXTREMITY ROM:  Active ROM Right eval Right 08/03/23 Right 08/09/23 Rt 08/15/23 Rt 08/17/23 Rt 08/22/23  Hip flexion        Hip extension        Hip abduction        Hip adduction        Hip internal rotation        Hip external rotation        Knee flexion 72 sitting 80d 75 (sitting) AA90d supine AA90d supine 92 AA  Knee extension 19 15d 13  13d 10 d  Ankle dorsiflexion        Ankle plantarflexion        Ankle inversion        Ankle eversion         (Blank rows = not tested)  LOWER EXTREMITY MMT:d  MMT Right eval Left eval  Hip flexion    Hip extension    Hip abduction    Hip adduction    Hip internal rotation    Hip external rotation    Knee flexion DNT 4/5  Knee extension DNT 4/5  Ankle dorsiflexion    Ankle plantarflexion    Ankle inversion    Ankle eversion     (Blank rows = not tested)  LOWER EXTREMITY SPECIAL TESTS:  DNT  FUNCTIONAL TESTS:  TUG: 41 seconds with FWW 30 Second Sit to Stand: 4 reps with B UE  GAIT: Distance walked: 96ft Assistive device utilized: Environmental consultant - 2 wheeled Level of assistance: Modified independence Comments: antalgic gait R, decreased knee ext R  TREATMENT: [facility] Adult PT Treatment:                                                DATE: 08/22/23 Therapeutic Exercise: Nustep L4 Ue/LE x 5 minutes Seated Calf sretch with strap Heel slide seated and supine AA to 92 degrees STS x 10 cues for equal weight Step stretch for flexion  and ext using second step on stair case Right step up x 10 bilat UE Supine calf stretch with strap  Therapeutic Activity: Stair negotiation using surgical to climb and step to pattern to descend    [facility] Adult PT Treatment:                                                DATE: 08/17/23 Therapeutic  Exercise: NuStep lvl 5 UE/LE x 8 min with progressive knee flexion while taking subjective Supine heel slide 2x10 - 5" hold R assist  c strap Supine SAQ in available ext ROM x10  Supine SLR x10 Supine hip abd x10 Modalities: Cold pack to the R knee x10 min Manual Therapy: Grade 2 patellar mobs and tib/fem AP and PA mobs, limited by pain  [facility] Adult PT Treatment:                                                DATE: 08/15/23 NuStep lvl 5 UE/LE x 8 min with progressive knee flexion while taking subjective Supine heel slide 2x10 - 5" hold R assist c strap Supine SAQ in available ext ROM x10  Supine SLR x10 Supine hip abd x10 Modalities: Colds pack to the R knee x10 mins  [facility] Adult PT Treatment:                                                DATE: 08/10/23 Therapeutic Exercise: NuStep lvl 5 UE/LE x 4 min while taking subjective Seated heel slide 2x10 - 5" hold R LLLD extension stretch to R knee with ice and 2# x 3 min Seated hamstring stretch  3x30" R Supine heel slide with strap x 10 - 5" hold LAQ x 10 Standing knee flexion stretch on 6in step x 10 - 5" hold R  [facility] Adult PT Treatment:                                                DATE: 08/03/23 Therapeutic Exercise: Heel slides c slde disc x5 Supine quad set x 5 - 5" hold Supine heel slide x 3  SLR x10 Supine hip abd x10 with slide disc Seated hamstring stretch  3x 30" R Supine ankle pumps x 20  PATIENT EDUCATION:  Education details: eval findings, FOTO, HEP, POC Person educated: Patient Education method: Explanation, Demonstration, and Handouts Education comprehension: verbalized understanding and returned demonstration  HOME EXERCISE PROGRAM: Access Code: [email or web address] URL: [email or web address] Date: 08/03/2023 Prepared by: Joellyn Rued  Exercises - Supine Quadricep Sets  - 4-5 x daily - 7 x weekly - 2 sets - 10 reps - 5 sec hold - Supine Heel Slide with Strap  - 4-5 x daily - 7 x weekly - 2 sets - 10 reps -  5 sec hold - Active Straight Leg Raise with Quad Set  - 4-5 x daily - 7 x weekly - 1 sets - 10 reps - 3 hold - Supine Knee Extension Strengthening  - 1 x daily - 7 x weekly - 1 sets - 10 reps - 3 hold - Seated Hamstring Stretch  - 4-5 x daily - 7 x weekly - 2 reps - 30 sec hold - Seated Heel Slide  - 4-5 x daily - 7 x weekly - 2 sets - 10 reps - 5 sec hold - Supine Ankle Pumps  - 4-5 x daily - 7 x weekly - 30 reps  ASSESSMENT:  CLINICAL IMPRESSION: PT was completed for R knee ROM and strengthening as tolerated.  Pt tolerated today's prescribed therex without adverse effects. Pt reports she has been climbing stairs this week to sleep in upstairs bedroom.  Began step ups in clinic using bilat UE. She can climb reciprocally and descend with step to pattern safely with hand rails. She did well with step stretch for knee flexion and extension. A cold pack was applied at the EOS for symptom management. Continues to require skilled PT services, will continue to progress as able per POC.   OBJECTIVE IMPAIRMENTS: Abnormal gait, decreased activity tolerance, decreased balance, decreased mobility, difficulty walking, decreased ROM, decreased strength, and pain.   ACTIVITY LIMITATIONS: sitting, standing, squatting, sleeping, stairs, transfers, bed mobility, and locomotion level  PARTICIPATION LIMITATIONS: driving, shopping, community activity, occupation, and yard work  PERSONAL FACTORS: Fitness and 3+ comorbidities: HTN, Myasthenia Gravis, DM II, CKD  are also affecting patient's functional outcome.   REHAB POTENTIAL: Good  CLINICAL DECISION MAKING: Evolving/moderate complexity  EVALUATION COMPLEXITY: Moderate   GOALS: Goals reviewed with patient? No  SHORT TERM GOALS: Target date: 08/21/2023   Pt will be compliant and knowledgeable with initial HEP for improved comfort and carryover Baseline: initial HEP given  Goal status: INITIAL  2.  Pt will self report right knee pain no greater than 6/10  for improved comfort and functional ability Baseline: 10/10 at worst Goal status: INITIAL   LONG TERM GOALS: Target date: 10/24/2023   Pt will improve FOTO function score to no less than 54% as proxy for functional improvement Baseline: 26% function Goal status: INITIAL   2.  Pt will self report right knee pain no greater than 3/10 for improved comfort and functional ability Baseline: 10/10 at worst Goal status: INITIAL   3.  Pt will increase 30 Second Sit to Stand rep count to no less than 8 reps for improved balance, strength, and functional mobility Baseline: 4 reps  Goal status: INITIAL   4.  Pt will decrease TUG score to no greater than 18 seconds with least restrictive assistive device for improved safety and decreased fall risk Baseline: 41 seconds FWW Goal status: INITIAL  5.  Pt will improve right knee AROM to at least range of 3-110 degrees for improved comfort, mobility, and gait Baseline: see ROM chart Goal status: INITIAL  PLAN:  PT FREQUENCY: 2x/week  PT DURATION: 12 weeks  PLANNED INTERVENTIONS: Therapeutic exercises, Therapeutic activity, Neuromuscular re-education, Balance training, Gait training, Patient/Family education, Self Care, Joint mobilization, Dry Needling, Electrical stimulation, Cryotherapy, Moist heat, Vasopneumatic device, Manual therapy, and Re-evaluation  PLAN FOR NEXT SESSION: assess HEP response, knee ROM, quad and hip strength, gait training   Jannette Spanner, PTA 08/22/23 12:12 PM Phone: [phone] Fax: [phone]

## 2023-08-28 ENCOUNTER — Ambulatory Visit (INDEPENDENT_AMBULATORY_CARE_PROVIDER_SITE_OTHER): Payer: Medicare HMO | Admitting: Orthopaedic Surgery

## 2023-08-28 ENCOUNTER — Encounter: Payer: Self-pay | Admitting: Orthopaedic Surgery

## 2023-08-28 DIAGNOSIS — Z96651 Presence of right artificial knee joint: Secondary | ICD-10-CM

## 2023-08-28 MED ORDER — CELECOXIB 200 MG PO CAPS: 200.0000 mg | ORAL_CAPSULE | Freq: Two times a day (BID) | ORAL | 1 refills | Status: DC | PRN

## 2023-08-28 MED ORDER — METHOCARBAMOL 500 MG PO TABS: 500.0000 mg | ORAL_TABLET | Freq: Four times a day (QID) | ORAL | 1 refills | Status: DC | PRN

## 2023-08-28 NOTE — Progress Notes (Signed)
The patient is now 6 weeks status post a right total knee arthroplasty.  She is making progress and I was able to review the notes from therapy.  On exam she has almost full extension and she really did not push her hard to just past 90 degrees.  The operative right knee does have some swelling to be expected but it is not severe.  Her calf is soft.  The right knee feels ligamentously stable.  I do feel that she would benefit from a steroid injection in that right knee as we do usually when manipulating knee because I think this will help push through the pain and scar tissue and help her get her knee moving better.  She agreed to this and tolerated the steroid injection well.  I will refill her methocarbamol and Celebrex.  We will try to wean her to hydrocodone at the next time she needs narcotics.  She can drive when she does not have a narcotic in her system.  Will see her back in 4 weeks for repeat exam but no x-rays are needed.

## 2023-09-01 ENCOUNTER — Ambulatory Visit: Payer: Medicare HMO | Admitting: Physical Therapy

## 2023-09-01 ENCOUNTER — Encounter: Payer: Self-pay | Admitting: Physical Therapy

## 2023-09-01 DIAGNOSIS — R2689 Other abnormalities of gait and mobility: Secondary | ICD-10-CM

## 2023-09-01 DIAGNOSIS — M25561 Pain in right knee: Secondary | ICD-10-CM

## 2023-09-01 DIAGNOSIS — M6281 Muscle weakness (generalized): Secondary | ICD-10-CM | POA: Diagnosis not present

## 2023-09-01 NOTE — Therapy (Signed)
OUTPATIENT PHYSICAL THERAPY TREATMENT NOTE   Patient Name: Gloria [name] MRN: [id number] DOB:1951/08/26, 72 y.o., female Today's Date: 09/01/2023  END OF SESSION:  PT End of Session - 09/01/23 1152     Visit Number 7    Number of Visits 24    Date for PT Re-Evaluation 10/24/23    Authorization Type [employer] MCR    PT Start Time 1145    PT Stop Time 1234    PT Time Calculation (min) 49 min                 Past Medical History:  Diagnosis Date   Alopecia areata 10/2010 onset   Arthritis    Diabetes mellitus, type 2 (HCC)    no longer per pt   Dyslipidemia    Hyperlipidemia    on meds   Hypertension    on meds   Hypothyroid    on meds   Myasthenia gravis    Past Surgical History:  Procedure Laterality Date   ABDOMINAL HYSTERECTOMY  12/12/1988   CARPAL TUNNEL RELEASE Left    CARPAL TUNNEL RELEASE Right    COLONOSCOPY  2008   [facility]   TOTAL KNEE ARTHROPLASTY Right 07/14/2023   Procedure: RIGHT TOTAL KNEE ARTHROPLASTY;  Surgeon: Kathryne Hitch, MD;  Location: [facility];  Service: Orthopedics;  Laterality: Right;   Patient Active Problem List   Diagnosis Date Noted   Status post total right knee replacement 07/14/2023   CKD (chronic kidney disease) stage 3, GFR 30-59 ml/min (HCC) 03/08/2023   Arthropathy of lumbar facet joint 11/14/2022   Unilateral primary osteoarthritis, right knee 09/26/2022   Head injury 04/15/2022   Bilateral low back pain without sciatica 09/29/2021   Bradycardia 08/20/2020   Acute cervical radiculopathy 07/09/2020   Pruritus 02/06/2020   Raynaud's phenomenon 01/06/2020   Left wrist tendinitis 04/30/2018   Hand numbness 01/24/2018   Lumbar radiculopathy 12/23/2016   Plantar fasciitis of right foot 06/22/2016   Carpal tunnel syndrome of right wrist 03/07/2013   Myasthenia gravis without exacerbation (HCC) 12/31/2010   Alopecia 12/31/2010   Hypothyroidism 12/29/2010   Type 2 diabetes, diet controlled (HCC) 12/29/2010    Hyperlipidemia 12/29/2010   Essential hypertension 12/29/2010   Coronary atherosclerosis 12/29/2010   Osteoarthritis 12/29/2010    PCP: Pincus Sanes, MD  REFERRING PROVIDER: Kathryne Hitch, MD  REFERRING DIAG:  [id number] (ICD-10-CM) - Status post total right knee replacement M17.11 (ICD-10-CM) - Unilateral primary osteoarthritis, right knee  THERAPY DIAG:  Acute pain of right knee  Muscle weakness (generalized)  Other abnormalities of gait and mobility  Rationale for Evaluation and Treatment: Rehabilitation  ONSET DATE: 07/14/2023  SUBJECTIVE:   SUBJECTIVE STATEMENT: Pt reports the R knee is getting better. 5/10 pain. Saw MD who gave an injection with mild improvement. Struggling with comfort at night.   EVAL: Pt presents to PT s/p R TKA performed by Dr. Magnus Ivan on 07/14/2023. Had been seeing HHPT for last few weeks since after surgery. Has had difficulty with stair navigation and notes general increase in pain today. States she doesn't feel like she can do much due to pain, not sure why today is uncomfortable. Denies pain in calf, PT educated on s/s of DVT.   PAIN:  Are you having pain?  Yes: NPRS scale: 5/10 Worst: 10/10 Pain location: Right knee Pain description: sore, post exercise Aggravating factors: walking, standing Relieving factors: rest, ice  PERTINENT HISTORY: HTN, Myasthenia Gravis, DM II, CKD  PRECAUTIONS: None  RED FLAGS: None   WEIGHT BEARING RESTRICTIONS: No  FALLS:  Has patient fallen in last 6 months? No  LIVING ENVIRONMENT: Lives with: lives alone Lives in: House/apartment Stairs: Yes: Internal: 14 steps; on right going up and External: 2 steps; none Has following equipment at home: Environmental consultant - 2 wheeled  OCCUPATION: Retired  PLOF: Independent  PATIENT GOALS: improve right knee to be able to get back to walking and working out  OBJECTIVE:   DIAGNOSTIC FINDINGS: See imaging  PATIENT SURVEYS:  FOTO: 26% function; 54%  predicted  COGNITION: Overall cognitive status: Within functional limits for tasks assessed     SENSATION: Light touch: Impaired - lateral R LE  EDEMA:  DNT  POSTURE: rounded shoulders and forward head  PALPATION: TTP to distal R quad, patellar hypomobility  LOWER EXTREMITY ROM:  Active ROM Right eval Right 08/03/23 Right 08/09/23 Rt 08/15/23 Rt 08/17/23 Rt 08/22/23 RT 09/01/23  Hip flexion         Hip extension         Hip abduction         Hip adduction         Hip internal rotation         Hip external rotation         Knee flexion 72 sitting 80d 75 (sitting) AA90d supine AA90d supine 92 AA 96 A/105 AA  Knee extension 19 15d 13  13d 10 d   Ankle dorsiflexion         Ankle plantarflexion         Ankle inversion         Ankle eversion          (Blank rows = not tested)  LOWER EXTREMITY MMT:d  MMT Right eval Left eval  Hip flexion    Hip extension    Hip abduction    Hip adduction    Hip internal rotation    Hip external rotation    Knee flexion DNT 4/5  Knee extension DNT 4/5  Ankle dorsiflexion    Ankle plantarflexion    Ankle inversion    Ankle eversion     (Blank rows = not tested)  LOWER EXTREMITY SPECIAL TESTS:  DNT  FUNCTIONAL TESTS:  TUG: 41 seconds with FWW 30 Second Sit to Stand: 4 reps with B UE  GAIT: Distance walked: 65ft Assistive device utilized: Environmental consultant - 2 wheeled Level of assistance: Modified independence Comments: antalgic gait R, decreased knee ext R  TREATMENT: [facility] Adult PT Treatment:                                                DATE: 09/01/23  Therapeutic Exercise: Nustep L4 x 6 min Seated calf stretch with strap Supine heel slide AROM, AAROM Supine h/s stretch with strap Supine Knee ext over towel roll 5 sec x 8  QS x 8 SLR x 8  LAQ x 10, 2# x 10  STS x 10- focus on equal weight Step up 6 inch x 10 Heel raise Gastroc stretch at sink x 20 sec   Modalities: Ice pack x 10 minutes     [facility] Adult PT  Treatment:  DATE: 08/22/23 Therapeutic Exercise: Nustep L4 Ue/LE x 5 minutes Seated Calf sretch with strap Heel slide seated and supine AA to 92 degrees STS x 10 cues for equal weight Step stretch for flexion  and ext using second step on stair case Right step up x 10 bilat UE Supine calf stretch with strap  Therapeutic Activity: Stair negotiation using surgical to climb and step to pattern to descend    [facility] Adult PT Treatment:                                                DATE: 08/17/23 Therapeutic Exercise: NuStep lvl 5 UE/LE x 8 min with progressive knee flexion while taking subjective Supine heel slide 2x10 - 5" hold R assist c strap Supine SAQ in available ext ROM x10  Supine SLR x10 Supine hip abd x10 Modalities: Cold pack to the R knee x10 min Manual Therapy: Grade 2 patellar mobs and tib/fem AP and PA mobs, limited by pain  [facility] Adult PT Treatment:                                                DATE: 08/15/23 NuStep lvl 5 UE/LE x 8 min with progressive knee flexion while taking subjective Supine heel slide 2x10 - 5" hold R assist c strap Supine SAQ in available ext ROM x10  Supine SLR x10 Supine hip abd x10 Modalities: Colds pack to the R knee x10 mins  [facility] Adult PT Treatment:                                                DATE: 08/10/23 Therapeutic Exercise: NuStep lvl 5 UE/LE x 4 min while taking subjective Seated heel slide 2x10 - 5" hold R LLLD extension stretch to R knee with ice and 2# x 3 min Seated hamstring stretch  3x30" R Supine heel slide with strap x 10 - 5" hold LAQ x 10 Standing knee flexion stretch on 6in step x 10 - 5" hold R  [facility] Adult PT Treatment:                                                DATE: 08/03/23 Therapeutic Exercise: Heel slides c slde disc x5 Supine quad set x 5 - 5" hold Supine heel slide x 3  SLR x10 Supine hip abd x10 with slide disc Seated hamstring stretch  3x 30" R Supine  ankle pumps x 20  PATIENT EDUCATION:  Education details: eval findings, FOTO, HEP, POC Person educated: Patient Education method: Explanation, Demonstration, and Handouts Education comprehension: verbalized understanding and returned demonstration  HOME EXERCISE PROGRAM: Access Code: [email or web address] URL: [email or web address] Date: 08/03/2023 Prepared by: Joellyn Rued  Exercises - Supine Quadricep Sets  - 4-5 x daily - 7 x weekly - 2 sets - 10 reps - 5 sec hold - Supine Heel Slide with Strap  - 4-5 x daily - 7 x weekly - 2 sets - 10  reps - 5 sec hold - Active Straight Leg Raise with Quad Set  - 4-5 x daily - 7 x weekly - 1 sets - 10 reps - 3 hold - Supine Knee Extension Strengthening  - 1 x daily - 7 x weekly - 1 sets - 10 reps - 3 hold - Seated Hamstring Stretch  - 4-5 x daily - 7 x weekly - 2 reps - 30 sec hold - Seated Heel Slide  - 4-5 x daily - 7 x weekly - 2 sets - 10 reps - 5 sec hold - Supine Ankle Pumps  - 4-5 x daily - 7 x weekly - 30 reps  ASSESSMENT:  CLINICAL IMPRESSION: PT was completed for R knee ROM and strengthening as tolerated.  Pt tolerated today's prescribed therex without adverse effects. Improved knee flexion ROM to 105 AAROM. Lacks full extension. Continued with progression of quad activation with good tolerance.  A cold pack was applied at the EOS for symptom management. Continues to require skilled PT services, will continue to progress as able per POC.   OBJECTIVE IMPAIRMENTS: Abnormal gait, decreased activity tolerance, decreased balance, decreased mobility, difficulty walking, decreased ROM, decreased strength, and pain.   ACTIVITY LIMITATIONS: sitting, standing, squatting, sleeping, stairs, transfers, bed mobility, and locomotion level  PARTICIPATION LIMITATIONS: driving, shopping, community activity, occupation, and yard work  PERSONAL FACTORS: Fitness and 3+ comorbidities: HTN, Myasthenia Gravis, DM II, CKD  are also affecting patient's  functional outcome.   REHAB POTENTIAL: Good  CLINICAL DECISION MAKING: Evolving/moderate complexity  EVALUATION COMPLEXITY: Moderate   GOALS: Goals reviewed with patient? No  SHORT TERM GOALS: Target date: 08/21/2023   Pt will be compliant and knowledgeable with initial HEP for improved comfort and carryover Baseline: initial HEP given  Goal status: MET  2.  Pt will self report right knee pain no greater than 6/10 for improved comfort and functional ability Baseline: 10/10 at worst 09/01/23: can still reach 10/10 at night, better during day (5/10)  Goal status: ONGOING  LONG TERM GOALS: Target date: 10/24/2023   Pt will improve FOTO function score to no less than 54% as proxy for functional improvement Baseline: 26% function Goal status: INITIAL   2.  Pt will self report right knee pain no greater than 3/10 for improved comfort and functional ability Baseline: 10/10 at worst Goal status: INITIAL   3.  Pt will increase 30 Second Sit to Stand rep count to no less than 8 reps for improved balance, strength, and functional mobility Baseline: 4 reps  Goal status: INITIAL   4.  Pt will decrease TUG score to no greater than 18 seconds with least restrictive assistive device for improved safety and decreased fall risk Baseline: 41 seconds FWW Goal status: INITIAL  5.  Pt will improve right knee AROM to at least range of 3-110 degrees for improved comfort, mobility, and gait Baseline: see ROM chart Goal status: INITIAL  PLAN:  PT FREQUENCY: 2x/week  PT DURATION: 12 weeks  PLANNED INTERVENTIONS: Therapeutic exercises, Therapeutic activity, Neuromuscular re-education, Balance training, Gait training, Patient/Family education, Self Care, Joint mobilization, Dry Needling, Electrical stimulation, Cryotherapy, Moist heat, Vasopneumatic device, Manual therapy, and Re-evaluation  PLAN FOR NEXT SESSION: assess HEP response, knee ROM, quad and hip strength, gait training , step ups,    Jannette Spanner, PTA 09/01/23 12:29 PM Phone: [phone] Fax: [phone]

## 2023-09-05 ENCOUNTER — Encounter: Payer: Self-pay | Admitting: Internal Medicine

## 2023-09-05 NOTE — Progress Notes (Unsigned)
Subjective:    Patient ID: Gloria [name], female    DOB: 1951/10/05, 72 y.o.   MRN: [id number]     HPI Gloria [name] is here for follow up of her chronic medical problems.  S/p R knee  replacement almost 2 months ago.  Has lost weight with the surgery.  Her appetite is improving so she is eating better.    Medications and allergies reviewed with patient and updated if appropriate.  Current Outpatient Medications on File Prior to Visit  Medication Sig Dispense Refill   acetaminophen (TYLENOL) 500 MG tablet Take 500 mg by mouth every 6 (six) hours as needed for moderate pain or mild pain.     amLODipine (NORVASC) 5 MG tablet TAKE 1 TABLET(5 MG) BY MOUTH DAILY 90 tablet 2   Ascorbic Acid (VITAMIN C PO) Take 1,000 mg by mouth daily at 6 (six) AM.     aspirin 81 MG chewable tablet Chew 1 tablet (81 mg total) by mouth 2 (two) times daily. 30 tablet 0   atorvastatin (LIPITOR) 10 MG tablet TAKE 1 TABLET BY MOUTH DAILY AT 6 PM 90 tablet 2   BIOTIN 5000 PO Take 10,000 mcg by mouth daily. Hair, skin, Nails     Blood Pressure Monitor DEVI Use to check blood pressure once daily 1 each 0   Blood Pressure Monitoring (ADULT BLOOD PRESSURE CUFF LG) KIT 1 kit by Does not apply route daily. 1 kit 0   CALCIUM-MAGNESIUM-ZINC PO Take 1 tablet by mouth daily at 6 (six) AM.     celecoxib (CELEBREX) 200 MG capsule Take 1 capsule (200 mg total) by mouth 2 (two) times daily between meals as needed. 60 capsule 1   Cholecalciferol (VITAMIN D) 50 MCG (2000 UT) CAPS Take 2,000 Units by mouth daily.     Cyanocobalamin (VITAMIN B 12 PO) Take 3,000 mcg by mouth daily at 6 (six) AM.     diclofenac Sodium (VOLTAREN) 1 % GEL Apply 4 g topically 4 (four) times daily. (Patient taking differently: Apply 4 g topically daily as needed (pain).) 100 g 5   Ferrous Sulfate Dried (SLOW RELEASE IRON) 45 MG TBCR Take 1 tablet by mouth daily.     levothyroxine (SYNTHROID) 100 MCG tablet TAKE 1 TABLET BY MOUTH DAILY 30 MINUTES  PRIOR TO BREAKFAST 6 DAYS A WEEK AND 1/2 TABLET 1 DAY A WEEK 90 tablet 0   methocarbamol (ROBAXIN) 500 MG tablet Take 1 tablet (500 mg total) by mouth every 6 (six) hours as needed. 40 tablet 1   Multiple Vitamins-Minerals (WOMENS 50+ MULTI VITAMIN PO) Take 1 tablet by mouth daily at 6 (six) AM.     mupirocin ointment (BACTROBAN) 2 % Apply to affected area daily and as needed. 30 g 0   Omega-3 1000 MG CAPS Take 1,000 mg by mouth daily.     Polyethyl Glycol-Propyl Glycol (SYSTANE) 0.4-0.3 % SOLN Place 1 drop into both eyes daily as needed (dry eyes).     telmisartan (MICARDIS) 80 MG tablet TAKE 1 TABLET(80 MG) BY MOUTH DAILY 90 tablet 1   tiZANidine (ZANAFLEX) 4 MG tablet Take 1 tablet (4 mg total) by mouth every 8 (eight) hours as needed for muscle spasms. 60 tablet 1   triamcinolone cream (KENALOG) 0.1 % APPLY TOPICALLY TO THE AFFECTED AREA TWICE DAILY (Patient taking differently: Apply 1 Application topically daily as needed (Irritation).) 30 g 0   No current facility-administered medications on file prior to visit.  Review of Systems  Constitutional:  Positive for appetite change (improved). Negative for fever.  Respiratory:  Negative for cough, shortness of breath and wheezing.   Cardiovascular:  Negative for chest pain, palpitations and leg swelling.  Neurological:  Negative for light-headedness and headaches.       Objective:   Vitals:   09/06/23 1538  BP: (!) 140/78  Pulse: 78  Temp: 98 F (36.7 C)  SpO2: 94%   BP Readings from Last 3 Encounters:  09/06/23 (!) 140/78  07/18/23 113/62  07/05/23 (!) 152/85   Wt Readings from Last 3 Encounters:  09/06/23 145 lb (65.8 kg)  07/14/23 156 lb (70.8 kg)  07/05/23 156 lb (70.8 kg)   Body mass index is 23.4 kg/m.    Physical Exam Constitutional:      General: She is not in acute distress.    Appearance: Normal appearance.  HENT:     Head: Normocephalic and atraumatic.  Eyes:     Conjunctiva/sclera: Conjunctivae  normal.  Cardiovascular:     Rate and Rhythm: Normal rate and regular rhythm.     Heart sounds: Normal heart sounds.  Pulmonary:     Effort: Pulmonary effort is normal. No respiratory distress.     Breath sounds: Normal breath sounds. No wheezing.  Musculoskeletal:     Cervical back: Neck supple.     Right lower leg: No edema.     Left lower leg: No edema.  Lymphadenopathy:     Cervical: No cervical adenopathy.  Skin:    General: Skin is warm and dry.     Findings: No rash.  Neurological:     Mental Status: She is alert. Mental status is at baseline.  Psychiatric:        Mood and Affect: Mood normal.        Behavior: Behavior normal.        Lab Results  Component Value Date   WBC 10.9 (H) 07/15/2023   HGB 11.9 (L) 07/15/2023   HCT 35.3 (L) 07/15/2023   PLT 162 07/15/2023   GLUCOSE 167 (H) 07/15/2023   CHOL 183 03/08/2023   TRIG 123.0 03/08/2023   HDL 59.20 03/08/2023   LDLCALC 99 03/08/2023   ALT 17 07/05/2023   AST 21 07/05/2023   NA 134 (L) 07/15/2023   K 3.8 07/15/2023   CL 102 07/15/2023   CREATININE 0.94 07/15/2023   BUN 12 07/15/2023   CO2 24 07/15/2023   TSH 4.77 03/08/2023   HGBA1C 6.1 (H) 07/05/2023   MICROALBUR 4.0 (H) 03/08/2023     Assessment & Plan:    See Problem List for Assessment and Plan of chronic medical problems.

## 2023-09-05 NOTE — Patient Instructions (Addendum)
      Blood work was ordered.   The lab is on the first floor.    Medications changes include :       A referral was ordered and someone will call you to schedule an appointment.     Return in about 6 months (around 03/05/2024) for Physical Exam.

## 2023-09-06 ENCOUNTER — Ambulatory Visit: Payer: Medicare HMO | Admitting: Internal Medicine

## 2023-09-06 ENCOUNTER — Telehealth: Payer: Self-pay | Admitting: *Deleted

## 2023-09-06 ENCOUNTER — Other Ambulatory Visit: Payer: Self-pay | Admitting: Orthopaedic Surgery

## 2023-09-06 VITALS — BP 140/78 | HR 78 | Temp 98.0°F | Ht 66.0 in | Wt 145.0 lb

## 2023-09-06 DIAGNOSIS — E039 Hypothyroidism, unspecified: Secondary | ICD-10-CM | POA: Diagnosis not present

## 2023-09-06 DIAGNOSIS — I2510 Atherosclerotic heart disease of native coronary artery without angina pectoris: Secondary | ICD-10-CM | POA: Diagnosis not present

## 2023-09-06 DIAGNOSIS — I10 Essential (primary) hypertension: Secondary | ICD-10-CM

## 2023-09-06 DIAGNOSIS — E119 Type 2 diabetes mellitus without complications: Secondary | ICD-10-CM | POA: Diagnosis not present

## 2023-09-06 DIAGNOSIS — N1831 Chronic kidney disease, stage 3a: Secondary | ICD-10-CM

## 2023-09-06 DIAGNOSIS — N3281 Overactive bladder: Secondary | ICD-10-CM

## 2023-09-06 DIAGNOSIS — E7849 Other hyperlipidemia: Secondary | ICD-10-CM | POA: Diagnosis not present

## 2023-09-06 MED ORDER — MIRABEGRON ER 25 MG PO TB24
25.0000 mg | ORAL_TABLET | Freq: Every day | ORAL | 1 refills | Status: DC
Start: 2023-09-06 — End: 2024-06-03

## 2023-09-06 MED ORDER — OXYCODONE HCL 5 MG PO TABS: 5.0000 mg | ORAL_TABLET | Freq: Four times a day (QID) | ORAL | 0 refills | Status: DC | PRN

## 2023-09-06 NOTE — Assessment & Plan Note (Signed)
Chronic Check lipid panel  Continue atorvastatin 10 mg daily Regular exercise and healthy diet encouraged

## 2023-09-06 NOTE — Assessment & Plan Note (Signed)
Subacute Taking Myrbetriq 25 mg daily and that has made a significant difference-will continue

## 2023-09-06 NOTE — Assessment & Plan Note (Signed)
Chronic No symptoms c/w angina Continue atorvastatin 10 mg daily, ASA 81 mg daily

## 2023-09-06 NOTE — Telephone Encounter (Signed)
Patient called requesting refill of pain medication. Thank you.

## 2023-09-06 NOTE — Assessment & Plan Note (Signed)
Chronic Discussed decreased kidney function She has stopped Aleve and will only take Tylenol Was prescribed Celebrex by orthopedics, but states she is not taking it much Increase water intake Continue low-sodium diet Stressed good BP, sugar control CBC, CMP

## 2023-09-06 NOTE — Assessment & Plan Note (Signed)
Chronic   Lab Results  Component Value Date   HGBA1C 6.1 (H) 07/05/2023   Sugars controlled Check A1c Continue lifestyle control Stressed regular exercise, diabetic diet

## 2023-09-06 NOTE — Assessment & Plan Note (Signed)
Chronic  Clinically euthyroid Check tsh and will titrate med dose if needed Currently taking levothyroxine 100 mcg daily 6 days a week, 50 mcg daily one day a week

## 2023-09-06 NOTE — Assessment & Plan Note (Addendum)
Chronic BP slightly elevated here today which could be related to her knee pain continue amlodipine 5 mg daily, telmisartan 80 mg daily Monitor BP at home - goal is < 130/80 Stressed importance of blood pressure being well-controlled especially because of her decreased kidney function cmp

## 2023-09-07 ENCOUNTER — Other Ambulatory Visit: Payer: Self-pay | Admitting: Internal Medicine

## 2023-09-07 LAB — COMPREHENSIVE METABOLIC PANEL
ALT: 9 U/L (ref 0–35)
AST: 19 U/L (ref 0–37)
Albumin: 4.2 g/dL (ref 3.5–5.2)
Alkaline Phosphatase: 83 U/L (ref 39–117)
BUN: 15 mg/dL (ref 6–23)
CO2: 30 mEq/L (ref 19–32)
Calcium: 9.9 mg/dL (ref 8.4–10.5)
Chloride: 103 mEq/L (ref 96–112)
Creatinine, Ser: 1.05 mg/dL (ref 0.40–1.20)
GFR: 53.23 mL/min — ABNORMAL LOW (ref >60.00)
Glucose, Bld: 90 mg/dL (ref 70–99)
Potassium: 4.4 mEq/L (ref 3.5–5.1)
Sodium: 139 mEq/L (ref 135–145)
Total Bilirubin: 0.5 mg/dL (ref 0.2–1.2)
Total Protein: 8.1 g/dL (ref 6.0–8.3)

## 2023-09-07 LAB — LIPID PANEL
Cholesterol: 180 mg/dL (ref 0–200)
HDL: 52.7 mg/dL (ref >39.00)
LDL Cholesterol: 110 mg/dL — ABNORMAL HIGH (ref 0–99)
NonHDL: 126.88
Total CHOL/HDL Ratio: 3
Triglycerides: 84 mg/dL (ref 0.0–149.0)
VLDL: 16.8 mg/dL (ref 0.0–40.0)

## 2023-09-07 LAB — CBC WITH DIFFERENTIAL/PLATELET
Basophils Absolute: 0 10*3/uL (ref 0.0–0.1)
Basophils Relative: 0.6 % (ref 0.0–3.0)
Eosinophils Absolute: 0.3 10*3/uL (ref 0.0–0.7)
Eosinophils Relative: 5.3 % — ABNORMAL HIGH (ref 0.0–5.0)
HCT: 35.7 % — ABNORMAL LOW (ref 36.0–46.0)
Hemoglobin: 11.6 g/dL — ABNORMAL LOW (ref 12.0–15.0)
Lymphocytes Relative: 24.8 % (ref 12.0–46.0)
Lymphs Abs: 1.4 10*3/uL (ref 0.7–4.0)
MCHC: 32.4 g/dL (ref 30.0–36.0)
MCV: 92.6 fl (ref 78.0–100.0)
Monocytes Absolute: 0.6 10*3/uL (ref 0.1–1.0)
Monocytes Relative: 11.1 % (ref 3.0–12.0)
Neutro Abs: 3.2 10*3/uL (ref 1.4–7.7)
Neutrophils Relative %: 58.2 % (ref 43.0–77.0)
Platelets: 230 10*3/uL (ref 150.0–400.0)
RBC: 3.86 Mil/uL — ABNORMAL LOW (ref 3.87–5.11)
RDW: 15.3 % (ref 11.5–15.5)
WBC: 5.6 10*3/uL (ref 4.0–10.5)

## 2023-09-07 LAB — TSH: TSH: 5.74 u[IU]/mL — ABNORMAL HIGH (ref 0.35–5.50)

## 2023-09-07 LAB — HEMOGLOBIN A1C: Hgb A1c MFr Bld: 5.7 % (ref 4.6–6.5)

## 2023-09-07 MED ORDER — LEVOTHYROXINE SODIUM 100 MCG PO TABS: ORAL_TABLET | ORAL | Status: DC

## 2023-09-11 NOTE — Therapy (Signed)
OUTPATIENT PHYSICAL THERAPY TREATMENT NOTE   Patient Name: Gloria [name] MRN: [id number] DOB:11/09/1951, 72 y.o., female Today's Date: 09/11/2023  END OF SESSION:        Past Medical History:  Diagnosis Date   Alopecia areata 10/2010 onset   Arthritis    Diabetes mellitus, type 2 (HCC)    no longer per pt   Dyslipidemia    Hyperlipidemia    on meds   Hypertension    on meds   Hypothyroid    on meds   Myasthenia gravis    Past Surgical History:  Procedure Laterality Date   ABDOMINAL HYSTERECTOMY  12/12/1988   CARPAL TUNNEL RELEASE Left    CARPAL TUNNEL RELEASE Right    COLONOSCOPY  2008   [facility]   TOTAL KNEE ARTHROPLASTY Right 07/14/2023   Procedure: RIGHT TOTAL KNEE ARTHROPLASTY;  Surgeon: Kathryne Hitch, MD;  Location: [facility];  Service: Orthopedics;  Laterality: Right;   Patient Active Problem List   Diagnosis Date Noted   Overactive bladder 09/06/2023   Status post total right knee replacement 07/14/23 07/14/2023   CKD (chronic kidney disease) stage 3, GFR 30-59 ml/min (HCC) 03/08/2023   Arthropathy of lumbar facet joint 11/14/2022   Bilateral low back pain without sciatica 09/29/2021   Bradycardia 08/20/2020   Acute cervical radiculopathy 07/09/2020   Pruritus 02/06/2020   Raynaud's phenomenon 01/06/2020   Left wrist tendinitis 04/30/2018   Hand numbness 01/24/2018   Lumbar radiculopathy 12/23/2016   Plantar fasciitis of right foot 06/22/2016   Carpal tunnel syndrome of right wrist 03/07/2013   Myasthenia gravis without exacerbation (HCC) 12/31/2010   Alopecia 12/31/2010   Hypothyroidism 12/29/2010   Type 2 diabetes, diet controlled (HCC) 12/29/2010   Hyperlipidemia 12/29/2010   Essential hypertension 12/29/2010   Coronary atherosclerosis 12/29/2010   Osteoarthritis 12/29/2010    PCP: Pincus Sanes, MD  REFERRING PROVIDER: Kathryne Hitch, MD  REFERRING DIAG:  [phone] (ICD-10-CM) - Status post total right knee  replacement M17.11 (ICD-10-CM) - Unilateral primary osteoarthritis, right knee  THERAPY DIAG:  No diagnosis found.  Rationale for Evaluation and Treatment: Rehabilitation  ONSET DATE: 07/14/2023  SUBJECTIVE:   SUBJECTIVE STATEMENT: Pt reports the R knee is getting better. 5/10 pain. Saw MD who gave an injection with mild improvement. Struggling with comfort at night.   EVAL: Pt presents to PT s/p R TKA performed by Dr. Magnus Ivan on 07/14/2023. Had been seeing HHPT for last few weeks since after surgery. Has had difficulty with stair navigation and notes general increase in pain today. States she doesn't feel like she can do much due to pain, not sure why today is uncomfortable. Denies pain in calf, PT educated on s/s of DVT.   PAIN:  Are you having pain?  Yes: NPRS scale: 5/10 Worst: 10/10 Pain location: Right knee Pain description: sore, post exercise Aggravating factors: walking, standing Relieving factors: rest, ice  PERTINENT HISTORY: HTN, Myasthenia Gravis, DM II, CKD  PRECAUTIONS: None  RED FLAGS: None   WEIGHT BEARING RESTRICTIONS: No  FALLS:  Has patient fallen in last 6 months? No  LIVING ENVIRONMENT: Lives with: lives alone Lives in: House/apartment Stairs: Yes: Internal: 14 steps; on right going up and External: 2 steps; none Has following equipment at home: Dan Humphreys - 2 wheeled  OCCUPATION: Retired  PLOF: Independent  PATIENT GOALS: improve right knee to be able to get back to walking and working out  OBJECTIVE:   DIAGNOSTIC FINDINGS: See imaging  PATIENT SURVEYS:  FOTO: 26% function; 54% predicted  COGNITION: Overall cognitive status: Within functional limits for tasks assessed     SENSATION: Light touch: Impaired - lateral R LE  EDEMA:  DNT  POSTURE: rounded shoulders and forward head  PALPATION: TTP to distal R quad, patellar hypomobility  LOWER EXTREMITY ROM:  Active ROM Right eval Right 08/03/23 Right 08/09/23 Rt 08/15/23 Rt 08/17/23  Rt 08/22/23 RT 09/01/23  Hip flexion         Hip extension         Hip abduction         Hip adduction         Hip internal rotation         Hip external rotation         Knee flexion 72 sitting 80d 75 (sitting) AA90d supine AA90d supine 92 AA 96 A/105 AA  Knee extension 19 15d 13  13d 10 d   Ankle dorsiflexion         Ankle plantarflexion         Ankle inversion         Ankle eversion          (Blank rows = not tested)  LOWER EXTREMITY MMT:d  MMT Right eval Left eval  Hip flexion    Hip extension    Hip abduction    Hip adduction    Hip internal rotation    Hip external rotation    Knee flexion DNT 4/5  Knee extension DNT 4/5  Ankle dorsiflexion    Ankle plantarflexion    Ankle inversion    Ankle eversion     (Blank rows = not tested)  LOWER EXTREMITY SPECIAL TESTS:  DNT  FUNCTIONAL TESTS:  TUG: 41 seconds with FWW 30 Second Sit to Stand: 4 reps with B UE  GAIT: Distance walked: 47ft Assistive device utilized: Environmental consultant - 2 wheeled Level of assistance: Modified independence Comments: antalgic gait R, decreased knee ext R  TREATMENT: [facility] Adult PT Treatment:                                                DATE: 09/13/23 Therapeutic Exercise: *** Manual Therapy: *** Neuromuscular re-ed: *** Therapeutic Activity: *** Modalities: *** Self Care: ***  [facility] Adult PT Treatment:                                                DATE: 09/01/23  Therapeutic Exercise: Nustep L4 x 6 min Seated calf stretch with strap Supine heel slide AROM, AAROM Supine h/s stretch with strap Supine Knee ext over towel roll 5 sec x 8  QS x 8 SLR x 8  LAQ x 10, 2# x 10  STS x 10- focus on equal weight Step up 6 inch x 10 Heel raise Gastroc stretch at sink x 20 sec   Modalities: Ice pack x 10 minutes     [facility] Adult PT Treatment:                                                DATE: 08/22/23 Therapeutic Exercise:  Nustep L4 Ue/LE x 5 minutes Seated Calf sretch with  strap Heel slide seated and supine AA to 92 degrees STS x 10 cues for equal weight Step stretch for flexion  and ext using second step on stair case Right step up x 10 bilat UE Supine calf stretch with strap  Therapeutic Activity: Stair negotiation using surgical to climb and step to pattern to descend    [facility] Adult PT Treatment:                                                DATE: 08/17/23 Therapeutic Exercise: NuStep lvl 5 UE/LE x 8 min with progressive knee flexion while taking subjective Supine heel slide 2x10 - 5" hold R assist c strap Supine SAQ in available ext ROM x10  Supine SLR x10 Supine hip abd x10 Modalities: Cold pack to the R knee x10 min Manual Therapy: Grade 2 patellar mobs and tib/fem AP and PA mobs, limited by pain  PATIENT EDUCATION:  Education details: eval findings, FOTO, HEP, POC Person educated: Patient Education method: Explanation, Demonstration, and Handouts Education comprehension: verbalized understanding and returned demonstration  HOME EXERCISE PROGRAM: Access Code: [email or web address] URL: [email or web address] Date: 08/03/2023 Prepared by: Joellyn Rued  Exercises - Supine Quadricep Sets  - 4-5 x daily - 7 x weekly - 2 sets - 10 reps - 5 sec hold - Supine Heel Slide with Strap  - 4-5 x daily - 7 x weekly - 2 sets - 10 reps - 5 sec hold - Active Straight Leg Raise with Quad Set  - 4-5 x daily - 7 x weekly - 1 sets - 10 reps - 3 hold - Supine Knee Extension Strengthening  - 1 x daily - 7 x weekly - 1 sets - 10 reps - 3 hold - Seated Hamstring Stretch  - 4-5 x daily - 7 x weekly - 2 reps - 30 sec hold - Seated Heel Slide  - 4-5 x daily - 7 x weekly - 2 sets - 10 reps - 5 sec hold - Supine Ankle Pumps  - 4-5 x daily - 7 x weekly - 30 reps  ASSESSMENT:  CLINICAL IMPRESSION: PT was completed for R knee ROM and strengthening as tolerated.  Pt tolerated today's prescribed therex without adverse effects. Improved knee flexion ROM to 105 AAROM.  Lacks full extension. Continued with progression of quad activation with good tolerance.  A cold pack was applied at the EOS for symptom management. Continues to require skilled PT services, will continue to progress as able per POC.   OBJECTIVE IMPAIRMENTS: Abnormal gait, decreased activity tolerance, decreased balance, decreased mobility, difficulty walking, decreased ROM, decreased strength, and pain.   ACTIVITY LIMITATIONS: sitting, standing, squatting, sleeping, stairs, transfers, bed mobility, and locomotion level  PARTICIPATION LIMITATIONS: driving, shopping, community activity, occupation, and yard work  PERSONAL FACTORS: Fitness and 3+ comorbidities: HTN, Myasthenia Gravis, DM II, CKD  are also affecting patient's functional outcome.   REHAB POTENTIAL: Good  CLINICAL DECISION MAKING: Evolving/moderate complexity  EVALUATION COMPLEXITY: Moderate   GOALS: Goals reviewed with patient? No  SHORT TERM GOALS: Target date: 08/21/2023   Pt will be compliant and knowledgeable with initial HEP for improved comfort and carryover Baseline: initial HEP given  Goal status: MET  2.  Pt will self report right knee pain no greater than 6/10 for improved comfort and  functional ability Baseline: 10/10 at worst 09/01/23: can still reach 10/10 at night, better during day (5/10)  Goal status: ONGOING  LONG TERM GOALS: Target date: 10/24/2023   Pt will improve FOTO function score to no less than 54% as proxy for functional improvement Baseline: 26% function Goal status: INITIAL   2.  Pt will self report right knee pain no greater than 3/10 for improved comfort and functional ability Baseline: 10/10 at worst Goal status: INITIAL   3.  Pt will increase 30 Second Sit to Stand rep count to no less than 8 reps for improved balance, strength, and functional mobility Baseline: 4 reps  Goal status: INITIAL   4.  Pt will decrease TUG score to no greater than 18 seconds with least restrictive  assistive device for improved safety and decreased fall risk Baseline: 41 seconds FWW Goal status: INITIAL  5.  Pt will improve right knee AROM to at least range of 3-110 degrees for improved comfort, mobility, and gait Baseline: see ROM chart Goal status: INITIAL  PLAN:  PT FREQUENCY: 2x/week  PT DURATION: 12 weeks  PLANNED INTERVENTIONS: Therapeutic exercises, Therapeutic activity, Neuromuscular re-education, Balance training, Gait training, Patient/Family education, Self Care, Joint mobilization, Dry Needling, Electrical stimulation, Cryotherapy, Moist heat, Vasopneumatic device, Manual therapy, and Re-evaluation  PLAN FOR NEXT SESSION: assess HEP response, knee ROM, quad and hip strength, gait training , step ups,   Tally Mattox MS, PT 09/11/23 10:14 PM

## 2023-09-13 ENCOUNTER — Other Ambulatory Visit: Payer: Self-pay | Admitting: Family Medicine

## 2023-09-13 ENCOUNTER — Ambulatory Visit: Payer: Medicare HMO | Attending: Orthopaedic Surgery

## 2023-09-13 DIAGNOSIS — R2689 Other abnormalities of gait and mobility: Secondary | ICD-10-CM | POA: Diagnosis not present

## 2023-09-13 DIAGNOSIS — M25561 Pain in right knee: Secondary | ICD-10-CM | POA: Insufficient documentation

## 2023-09-13 DIAGNOSIS — N3281 Overactive bladder: Secondary | ICD-10-CM

## 2023-09-13 DIAGNOSIS — M6281 Muscle weakness (generalized): Secondary | ICD-10-CM | POA: Insufficient documentation

## 2023-09-18 ENCOUNTER — Other Ambulatory Visit: Payer: Self-pay

## 2023-09-18 ENCOUNTER — Ambulatory Visit: Payer: Medicare HMO | Admitting: Physical Therapy

## 2023-09-18 ENCOUNTER — Encounter: Payer: Self-pay | Admitting: Physical Therapy

## 2023-09-18 DIAGNOSIS — M6281 Muscle weakness (generalized): Secondary | ICD-10-CM

## 2023-09-18 DIAGNOSIS — M25561 Pain in right knee: Secondary | ICD-10-CM | POA: Diagnosis not present

## 2023-09-18 DIAGNOSIS — R2689 Other abnormalities of gait and mobility: Secondary | ICD-10-CM

## 2023-09-18 NOTE — Therapy (Signed)
OUTPATIENT PHYSICAL THERAPY TREATMENT NOTE   Patient Name: Gloria [name] MRN: [id number] DOB:[birth year], 72 y.o., female Today's Date: 09/18/2023  END OF SESSION:  PT End of Session - 09/18/23 1113     Visit Number 9    Number of Visits 24    Date for PT Re-Evaluation 10/24/23    Authorization Type [employer] MCR    Authorization Time Period [employer] MCR/MCD    PT Start Time 1105    PT Stop Time 1155    PT Time Calculation (min) 50 min    Activity Tolerance Patient tolerated treatment well    Behavior During Therapy WFL for tasks assessed/performed                   Past Medical History:  Diagnosis Date   Alopecia areata 10/2010 onset   Arthritis    Diabetes mellitus, type 2 (HCC)    no longer per pt   Dyslipidemia    Hyperlipidemia    on meds   Hypertension    on meds   Hypothyroid    on meds   Myasthenia gravis    Past Surgical History:  Procedure Laterality Date   ABDOMINAL HYSTERECTOMY  12/12/1988   CARPAL TUNNEL RELEASE Left    CARPAL TUNNEL RELEASE Right    COLONOSCOPY  2008   [facility]   TOTAL KNEE ARTHROPLASTY Right 07/14/2023   Procedure: RIGHT TOTAL KNEE ARTHROPLASTY;  Surgeon: Kathryne Hitch, MD;  Location: [facility];  Service: Orthopedics;  Laterality: Right;   Patient Active Problem List   Diagnosis Date Noted   Overactive bladder 09/06/2023   Status post total right knee replacement 07/14/23 07/14/2023   CKD (chronic kidney disease) stage 3, GFR 30-59 ml/min (HCC) 03/08/2023   Arthropathy of lumbar facet joint 11/14/2022   Bilateral low back pain without sciatica 09/29/2021   Bradycardia 08/20/2020   Acute cervical radiculopathy 07/09/2020   Pruritus 02/06/2020   Raynaud's phenomenon 01/06/2020   Left wrist tendinitis 04/30/2018   Hand numbness 01/24/2018   Lumbar radiculopathy 12/23/2016   Plantar fasciitis of right foot 06/22/2016   Carpal tunnel syndrome of right wrist 03/07/2013   Myasthenia gravis without exacerbation (HCC)  12/31/2010   Alopecia 12/31/2010   Hypothyroidism 12/29/2010   Type 2 diabetes, diet controlled (HCC) 12/29/2010   Hyperlipidemia 12/29/2010   Essential hypertension 12/29/2010   Coronary atherosclerosis 12/29/2010   Osteoarthritis 12/29/2010    PCP: Pincus Sanes, MD  REFERRING PROVIDER: Kathryne Hitch, MD  REFERRING DIAG:  [phone] (ICD-10-CM) - Status post total right knee replacement M17.11 (ICD-10-CM) - Unilateral primary osteoarthritis, right knee  THERAPY DIAG:  Acute pain of right knee  Muscle weakness (generalized)  Other abnormalities of gait and mobility  Rationale for Evaluation and Treatment: Rehabilitation  ONSET DATE: 07/14/2023  SUBJECTIVE:   SUBJECTIVE STATEMENT: Patient reports knee is still hurting. Pain primarily at night and morning, but will have constant pain.   EVAL: Pt presents to PT s/p R TKA performed by Dr. Magnus Ivan on 07/14/2023. Had been seeing HHPT for last few weeks since after surgery. Has had difficulty with stair navigation and notes general increase in pain today. States she doesn't feel like she can do much due to pain, not sure why today is uncomfortable. Denies pain in calf, PT educated on s/s of DVT.   PAIN:  Are you having pain?  Yes: NPRS scale: 5/10 Worst: 10/10 Pain location: Right knee Pain description: sore, post exercise Aggravating factors: walking, standing Relieving  factors: rest, ice  PERTINENT HISTORY: HTN, Myasthenia Gravis, DM II, CKD  PRECAUTIONS: None  RED FLAGS: None   WEIGHT BEARING RESTRICTIONS: No  FALLS:  Has patient fallen in last 6 months? No  LIVING ENVIRONMENT: Lives with: lives alone Lives in: House/apartment Stairs: Yes: Internal: 14 steps; on right going up and External: 2 steps; none Has following equipment at home: Environmental consultant - 2 wheeled  OCCUPATION: Retired  PLOF: Independent  PATIENT GOALS: improve right knee to be able to get back to walking and working out  OBJECTIVE:    DIAGNOSTIC FINDINGS: See imaging  PATIENT SURVEYS:  FOTO: 26% function; 54% predicted  COGNITION: Overall cognitive status: Within functional limits for tasks assessed     SENSATION: Light touch: Impaired - lateral R LE  EDEMA:  DNT  POSTURE: rounded shoulders and forward head  PALPATION: TTP to distal R quad, patellar hypomobility  LOWER EXTREMITY ROM:  Active ROM Right eval Right 08/03/23 Right 08/09/23 Rt 08/15/23 Rt 08/17/23 Rt 08/22/23 RT 09/01/23 RT 09/13/23  Hip flexion          Hip extension          Hip abduction          Hip adduction          Hip internal rotation          Hip external rotation          Knee flexion 72 sitting 80d 75 (sitting) AA90d supine AA90d supine 92 AA 96 A/105 AA AA112  Knee extension 19 15d 13  13d 10 d  10d  Ankle dorsiflexion          Ankle plantarflexion          Ankle inversion          Ankle eversion           (Blank rows = not tested)  LOWER EXTREMITY MMT:  MMT Right eval Left eval  Hip flexion    Hip extension    Hip abduction    Hip adduction    Hip internal rotation    Hip external rotation    Knee flexion DNT 4/5  Knee extension DNT 4/5  Ankle dorsiflexion    Ankle plantarflexion    Ankle inversion    Ankle eversion     (Blank rows = not tested)  LOWER EXTREMITY SPECIAL TESTS:  DNT  FUNCTIONAL TESTS:  TUG: 41 seconds with FWW 30 Second Sit to Stand: 4 reps with B UE  GAIT: Distance walked: 2ft Assistive device utilized: Environmental consultant - 2 wheeled Level of assistance: Modified independence Comments: antalgic gait R, decreased knee ext R   TREATMENT: [facility] Adult PT Treatment:                                                DATE: 09/18/23 Therapeutic Exercise: Nustep L4 x 5 min while taking subjective Seated hamstring stretch 3 x 20 sec Slant board calf stretch 3 x 20 sec Supine quad set 10 x 5 sec hold SLR x 10 LAQ 2 x 10 Manual: Seated right knee mobs to improve knee motion Seated right knee  flexion PROM Supine patellar mobs all directions Modalities: Ice pack x 10 minutes    [facility] Adult PT Treatment:  DATE: 09/13/23 Therapeutic Exercise: Nustep L4 x 6 min Seated c foot on stool QS Seated hamstring/calf stretch with strap Supine heel slide AROM, AAROM QS x 10 SLR x 10  LAQ 2x 10 3# STS x 10- focus on equal weight Heel raise Modalities: Ice pack x 10 minutes   [facility] Adult PT Treatment:                                                DATE: 09/01/23  Therapeutic Exercise: Nustep L4 x 6 min Seated calf stretch with strap Supine heel slide AROM, AAROM Supine h/s stretch with strap Supine Knee ext over towel roll 5 sec x 8  QS x 8 SLR x 8 LAQ x 10, 2# x 10  STS x 10- focus on equal weight Step up 6 inch x 10 Heel raise Gastroc stretch at sink x 20 sec   Modalities: Ice pack x 10 minutes    PATIENT EDUCATION:  Education details: eval findings, FOTO, HEP, POC Person educated: Patient Education method: Explanation, Demonstration, and Handouts Education comprehension: verbalized understanding and returned demonstration  HOME EXERCISE PROGRAM: Access Code: [email or web address] URL: [email or web address] Date: 08/03/2023 Prepared by: Joellyn Rued  Exercises - Supine Quadricep Sets  - 4-5 x daily - 7 x weekly - 2 sets - 10 reps - 5 sec hold - Supine Heel Slide with Strap  - 4-5 x daily - 7 x weekly - 2 sets - 10 reps - 5 sec hold - Active Straight Leg Raise with Quad Set  - 4-5 x daily - 7 x weekly - 1 sets - 10 reps - 3 hold - Supine Knee Extension Strengthening  - 1 x daily - 7 x weekly - 1 sets - 10 reps - 3 hold - Seated Hamstring Stretch  - 4-5 x daily - 7 x weekly - 2 reps - 30 sec hold - Seated Heel Slide  - 4-5 x daily - 7 x weekly - 2 sets - 10 reps - 5 sec hold - Supine Ankle Pumps  - 4-5 x daily - 7 x weekly - 30 reps  ASSESSMENT:  CLINICAL IMPRESSION: Patient with fair tolerance for therapy due to  knee pain, no adverse effects reported. Therapy focused on progressing her knee mobility and light quad strengthening. She reports continued knee pain with end range PROM and is guarded with manual therapy. She requires consistent cueing for quad activation with SLR to avoid lag. No changes made to HEP. Patient would benefit from continued skilled PT to progress her mobility and strength in order to reduce pain and maximize functional ability.   OBJECTIVE IMPAIRMENTS: Abnormal gait, decreased activity tolerance, decreased balance, decreased mobility, difficulty walking, decreased ROM, decreased strength, and pain.   ACTIVITY LIMITATIONS: sitting, standing, squatting, sleeping, stairs, transfers, bed mobility, and locomotion level  PARTICIPATION LIMITATIONS: driving, shopping, community activity, occupation, and yard work  PERSONAL FACTORS: Fitness and 3+ comorbidities: HTN, Myasthenia Gravis, DM II, CKD  are also affecting patient's functional outcome.   REHAB POTENTIAL: Good  CLINICAL DECISION MAKING: Evolving/moderate complexity  EVALUATION COMPLEXITY: Moderate   GOALS: Goals reviewed with patient? No  SHORT TERM GOALS: Target date: 08/21/2023   Pt will be compliant and knowledgeable with initial HEP for improved comfort and carryover Baseline: initial HEP given  Goal status: MET  2.  Pt will self report right knee  pain no greater than 6/10 for improved comfort and functional ability Baseline: 10/10 at worst 09/01/23: can still reach 10/10 at night, better during day (5/10)  Goal status: ONGOING  LONG TERM GOALS: Target date: 10/24/2023   Pt will improve FOTO function score to no less than 54% as proxy for functional improvement Baseline: 26% function Goal status: INITIAL   2.  Pt will self report right knee pain no greater than 3/10 for improved comfort and functional ability Baseline: 10/10 at worst Goal status: INITIAL   3.  Pt will increase 30 Second Sit to Stand rep count  to no less than 8 reps for improved balance, strength, and functional mobility Baseline: 4 reps  Goal status: INITIAL   4.  Pt will decrease TUG score to no greater than 18 seconds with least restrictive assistive device for improved safety and decreased fall risk Baseline: 41 seconds FWW Goal status: INITIAL  5.  Pt will improve right knee AROM to at least range of 3-110 degrees for improved comfort, mobility, and gait Baseline: see ROM chart 09/13/23:AA 10-112 Goal status: Improving  PLAN:  PT FREQUENCY: 2x/week  PT DURATION: 12 weeks  PLANNED INTERVENTIONS: Therapeutic exercises, Therapeutic activity, Neuromuscular re-education, Balance training, Gait training, Patient/Family education, Self Care, Joint mobilization, Dry Needling, Electrical stimulation, Cryotherapy, Moist heat, Vasopneumatic device, Manual therapy, and Re-evaluation  PLAN FOR NEXT SESSION: assess HEP response, knee ROM, quad and hip strength, gait training , step ups,   Rosana Hoes, PT, DPT, LAT, ATC 09/18/23  11:49 AM Phone: [phone] Fax: [phone]

## 2023-09-21 ENCOUNTER — Ambulatory Visit: Payer: Medicare HMO

## 2023-09-21 DIAGNOSIS — M6281 Muscle weakness (generalized): Secondary | ICD-10-CM | POA: Diagnosis not present

## 2023-09-21 DIAGNOSIS — R2689 Other abnormalities of gait and mobility: Secondary | ICD-10-CM | POA: Diagnosis not present

## 2023-09-21 DIAGNOSIS — M25561 Pain in right knee: Secondary | ICD-10-CM | POA: Diagnosis not present

## 2023-09-21 NOTE — Therapy (Signed)
OUTPATIENT PHYSICAL THERAPY TREATMENT NOTE/PROGRESS NOTE  Progress Note Reporting Period 07/31/23 to 09/21/23  See note below for Objective Data and Assessment of Progress/Goals.      Patient Name: Gloria [name] MRN: [id number] DOB:June 18, 1951, 72 y.o., female Today's Date: 09/21/2023  END OF SESSION:  PT End of Session - 09/21/23 1104     Visit Number 10    Number of Visits 24    Date for PT Re-Evaluation 10/24/23    Authorization Type [employer] MCR    Authorization Time Period [employer] MCR/MCD    PT Start Time 1104    PT Stop Time 1145    PT Time Calculation (min) 41 min    Activity Tolerance Patient tolerated treatment well    Behavior During Therapy WFL for tasks assessed/performed                    Past Medical History:  Diagnosis Date   Alopecia areata 10/2010 onset   Arthritis    Diabetes mellitus, type 2 (HCC)    no longer per pt   Dyslipidemia    Hyperlipidemia    on meds   Hypertension    on meds   Hypothyroid    on meds   Myasthenia gravis    Past Surgical History:  Procedure Laterality Date   ABDOMINAL HYSTERECTOMY  12/12/1988   CARPAL TUNNEL RELEASE Left    CARPAL TUNNEL RELEASE Right    COLONOSCOPY  2008   [facility]   TOTAL KNEE ARTHROPLASTY Right 07/14/2023   Procedure: RIGHT TOTAL KNEE ARTHROPLASTY;  Surgeon: Kathryne Hitch, MD;  Location: [facility];  Service: Orthopedics;  Laterality: Right;   Patient Active Problem List   Diagnosis Date Noted   Overactive bladder 09/06/2023   Status post total right knee replacement 07/14/23 07/14/2023   CKD (chronic kidney disease) stage 3, GFR 30-59 ml/min (HCC) 03/08/2023   Arthropathy of lumbar facet joint 11/14/2022   Bilateral low back pain without sciatica 09/29/2021   Bradycardia 08/20/2020   Acute cervical radiculopathy 07/09/2020   Pruritus 02/06/2020   Raynaud's phenomenon 01/06/2020   Left wrist tendinitis 04/30/2018   Hand numbness 01/24/2018   Lumbar radiculopathy 12/23/2016    Plantar fasciitis of right foot 06/22/2016   Carpal tunnel syndrome of right wrist 03/07/2013   Myasthenia gravis without exacerbation (HCC) 12/31/2010   Alopecia 12/31/2010   Hypothyroidism 12/29/2010   Type 2 diabetes, diet controlled (HCC) 12/29/2010   Hyperlipidemia 12/29/2010   Essential hypertension 12/29/2010   Coronary atherosclerosis 12/29/2010   Osteoarthritis 12/29/2010    PCP: Pincus Sanes, MD  REFERRING PROVIDER: Kathryne Hitch, MD  REFERRING DIAG:  [phone] (ICD-10-CM) - Status post total right knee replacement M17.11 (ICD-10-CM) - Unilateral primary osteoarthritis, right knee  THERAPY DIAG:  Acute pain of right knee  Muscle weakness (generalized)  Other abnormalities of gait and mobility  Rationale for Evaluation and Treatment: Rehabilitation  ONSET DATE: 07/14/2023  SUBJECTIVE:   SUBJECTIVE STATEMENT: Pt presents to PT with reports of continued knee pain. Has follow up with Dr. Magnus Ivan on 09/25/23. Has been compliant with HPE per report.   EVAL: Pt presents to PT s/p R TKA performed by Dr. Magnus Ivan on 07/14/2023. Had been seeing HHPT for last few weeks since after surgery. Has had difficulty with stair navigation and notes general increase in pain today. States she doesn't feel like she can do much due to pain, not sure why today is uncomfortable. Denies pain in calf, PT educated on  s/s of DVT.   PAIN:  Are you having pain?  Yes: NPRS scale: 5/10 Worst: 10/10 Pain location: Right knee Pain description: sore, post exercise Aggravating factors: walking, standing Relieving factors: rest, ice  PERTINENT HISTORY: HTN, Myasthenia Gravis, DM II, CKD  PRECAUTIONS: None  RED FLAGS: None   WEIGHT BEARING RESTRICTIONS: No  FALLS:  Has patient fallen in last 6 months? No  LIVING ENVIRONMENT: Lives with: lives alone Lives in: House/apartment Stairs: Yes: Internal: 14 steps; on right going up and External: 2 steps; none Has following  equipment at home: Walker - 2 wheeled  OCCUPATION: Retired  PLOF: Independent  PATIENT GOALS: improve right knee to be able to get back to walking and working out  OBJECTIVE:   DIAGNOSTIC FINDINGS: See imaging  PATIENT SURVEYS:  FOTO: 26% function; 54% predicted 09/21/23: 56% function  COGNITION: Overall cognitive status: Within functional limits for tasks assessed     SENSATION: Light touch: Impaired - lateral R LE  EDEMA:  DNT  POSTURE: rounded shoulders and forward head  PALPATION: TTP to distal R quad, patellar hypomobility  LOWER EXTREMITY ROM:  Active ROM Right eval Rt 08/17/23 Rt 08/22/23 RT 09/01/23 RT 09/13/23 RT 09/21/23  Knee flexion 72 sitting AA90d supine 92 AA 96 A/105 AA AA112 A 112  Knee extension 19 13d 10 d  10d A 6   (Blank rows = not tested)  LOWER EXTREMITY MMT:  MMT Right eval Left eval  Knee flexion DNT 4/5  Knee extension DNT 4/5   (Blank rows = not tested)  LOWER EXTREMITY SPECIAL TESTS:  DNT  FUNCTIONAL TESTS:  TUG: 41 seconds with FWW  09/21/2023: 19 seconds no AD 30 Second Sit to Stand: 4 reps with B UE  09/21/2023:   GAIT: Distance walked: 25ft Assistive device utilized: Environmental consultant - 2 wheeled Level of assistance: Modified independence Comments: antalgic gait R, decreased knee ext R   TREATMENT: [facility] Adult PT Treatment:                                                DATE: 09/21/23 Therapeutic Exercise: Nustep L5 x 5 min while taking subjective Seated hamstring stretch 3x20 sec LAQ 2x10 2# R Supine quad set 10 x 5 sec hold Supine heel slide x 10 - 5" hold SLR 2x10 Therapeutic Activity: Assessment of tests/measures, goals, and outcomes for progress note Manual Therapy: Seated right knee mobs to improve knee motion Seated right knee flexion PROM Supine patellar mobs all directions Modalities: Ice pack x 10 minutes   [facility] Adult PT Treatment:                                                DATE:  09/18/23 Therapeutic Exercise: Nustep L4 x 5 min while taking subjective Seated hamstring stretch 3 x 20 sec Slant board calf stretch 3 x 20 sec Supine quad set 10 x 5 sec hold SLR x 10 LAQ 2 x 10 Manual: Seated right knee mobs to improve knee motion Seated right knee flexion PROM Supine patellar mobs all directions Modalities: Ice pack x 10 minutes    [facility] Adult PT Treatment:  DATE: 09/13/23 Therapeutic Exercise: Nustep L4 x 6 min Seated c foot on stool QS Seated hamstring/calf stretch with strap Supine heel slide AROM, AAROM QS x 10 SLR x 10  LAQ 2x 10 3# STS x 10- focus on equal weight Heel raise Modalities: Ice pack x 10 minutes   [facility] Adult PT Treatment:                                                DATE: 09/01/23  Therapeutic Exercise: Nustep L4 x 6 min Seated calf stretch with strap Supine heel slide AROM, AAROM Supine h/s stretch with strap Supine Knee ext over towel roll 5 sec x 8  QS x 8 SLR x 8 LAQ x 10, 2# x 10  STS x 10- focus on equal weight Step up 6 inch x 10 Heel raise Gastroc stretch at sink x 20 sec   Modalities: Ice pack x 10 minutes    PATIENT EDUCATION:  Education details: continue HEP Person educated: Patient Education method: Explanation, Demonstration, and Handouts Education comprehension: verbalized understanding and returned demonstration  HOME EXERCISE PROGRAM: Access Code: [email or web address] URL: [email or web address] Date: 08/03/2023 Prepared by: Joellyn Rued  Exercises - Supine Quadricep Sets  - 4-5 x daily - 7 x weekly - 2 sets - 10 reps - 5 sec hold - Supine Heel Slide with Strap  - 4-5 x daily - 7 x weekly - 2 sets - 10 reps - 5 sec hold - Active Straight Leg Raise with Quad Set  - 4-5 x daily - 7 x weekly - 1 sets - 10 reps - 3 hold - Supine Knee Extension Strengthening  - 1 x daily - 7 x weekly - 1 sets - 10 reps - 3 hold - Seated Hamstring Stretch  - 4-5 x daily - 7 x  weekly - 2 reps - 30 sec hold - Seated Heel Slide  - 4-5 x daily - 7 x weekly - 2 sets - 10 reps - 5 sec hold - Supine Ankle Pumps  - 4-5 x daily - 7 x weekly - 30 reps  ASSESSMENT:  CLINICAL IMPRESSION: Pt was able to complete all prescribed exercises with no adverse effect, does have continued pain. Over the course of PT treatment she is progressing well in recent weeks post TKA. Continues to show improved ROM post surgery, also notes continued subjective functional improvement assessed via FOTO. Functional mobility increased via improved TUG and 30 Second Sit to Stand scores and reps. She continues to benefit from skilled PT, will continue to progress as able per POC.   OBJECTIVE IMPAIRMENTS: Abnormal gait, decreased activity tolerance, decreased balance, decreased mobility, difficulty walking, decreased ROM, decreased strength, and pain.   ACTIVITY LIMITATIONS: sitting, standing, squatting, sleeping, stairs, transfers, bed mobility, and locomotion level  PARTICIPATION LIMITATIONS: driving, shopping, community activity, occupation, and yard work  PERSONAL FACTORS: Fitness and 3+ comorbidities: HTN, Myasthenia Gravis, DM II, CKD  are also affecting patient's functional outcome.   REHAB POTENTIAL: Good  CLINICAL DECISION MAKING: Evolving/moderate complexity  EVALUATION COMPLEXITY: Moderate   GOALS: Goals reviewed with patient? No  SHORT TERM GOALS: Target date: 08/21/2023   Pt will be compliant and knowledgeable with initial HEP for improved comfort and carryover Baseline: initial HEP given  Goal status: MET  2.  Pt will self report right knee pain no greater than 6/10  for improved comfort and functional ability Baseline: 10/10 at worst 09/01/23: can still reach 10/10 at night, better during day (5/10)  09/21/23 - 7/10 at night Goal status: IN PROGRESS  LONG TERM GOALS: Target date: 10/24/2023   Pt will improve FOTO function score to no less than 54% as proxy for functional  improvement Baseline: 26% function 09/21/23: 56% function Goal status: MET   2.  Pt will self report right knee pain no greater than 3/10 for improved comfort and functional ability Baseline: 10/10 at worst Goal status: IN PROGRESS   3.  Pt will increase 30 Second Sit to Stand rep count to no less than 8 reps for improved balance, strength, and functional mobility Baseline: 4 reps  09/21/23: 6 reps Goal status: IN PROGRESS   4.  Pt will decrease TUG score to no greater than 18 seconds with least restrictive assistive device for improved safety and decreased fall risk Baseline: 41 seconds FWW 09/21/23: 19 seconds no AD Goal status: IN PROGRESS  5.  Pt will improve right knee AROM to at least range of 3-110 degrees for improved comfort, mobility, and gait Baseline: see ROM chart 09/13/23:AA 10-112 09/21/23: A 6-112 Goal status: IN PROGRESS  PLAN:  PT FREQUENCY: 2x/week  PT DURATION: 12 weeks  PLANNED INTERVENTIONS: Therapeutic exercises, Therapeutic activity, Neuromuscular re-education, Balance training, Gait training, Patient/Family education, Self Care, Joint mobilization, Dry Needling, Electrical stimulation, Cryotherapy, Moist heat, Vasopneumatic device, Manual therapy, and Re-evaluation  PLAN FOR NEXT SESSION: assess HEP response, knee ROM, quad and hip strength, gait training , step ups,   Eloy End PT  09/21/23 1:58 PM

## 2023-09-25 ENCOUNTER — Encounter: Payer: Self-pay | Admitting: Orthopaedic Surgery

## 2023-09-25 ENCOUNTER — Ambulatory Visit (INDEPENDENT_AMBULATORY_CARE_PROVIDER_SITE_OTHER): Payer: Medicare HMO | Admitting: Orthopaedic Surgery

## 2023-09-25 DIAGNOSIS — Z96651 Presence of right artificial knee joint: Secondary | ICD-10-CM | POA: Diagnosis not present

## 2023-09-25 MED ORDER — METHOCARBAMOL 500 MG PO TABS: 500.0000 mg | ORAL_TABLET | Freq: Four times a day (QID) | ORAL | 1 refills | Status: DC | PRN

## 2023-09-25 MED ORDER — OXYCODONE HCL 5 MG PO TABS: 5.0000 mg | ORAL_TABLET | Freq: Two times a day (BID) | ORAL | 0 refills | Status: DC | PRN

## 2023-09-25 NOTE — Progress Notes (Signed)
The patient is now just past 10 weeks status post a right total knee arthroplasty.  She is making good progress at this point.  On exam her extension is almost full and I can flex her to well past 90 degrees.  The knee feels ligamentously stable.  She is working on weaning from pain medication.  She does need 1 more refill of oxycodone and methocarbamol.  She does have Celebrex at home so I want her to start back on Celebrex.  From my standpoint we will see her back in 3 months with a standing AP and lateral of her right operative knee.

## 2023-09-27 ENCOUNTER — Other Ambulatory Visit: Payer: Self-pay | Admitting: Internal Medicine

## 2023-09-28 ENCOUNTER — Encounter: Payer: Self-pay | Admitting: Physical Therapy

## 2023-09-28 ENCOUNTER — Telehealth: Payer: Self-pay | Admitting: Internal Medicine

## 2023-09-28 ENCOUNTER — Ambulatory Visit: Payer: Medicare HMO | Admitting: Physical Therapy

## 2023-09-28 DIAGNOSIS — M25561 Pain in right knee: Secondary | ICD-10-CM | POA: Diagnosis not present

## 2023-09-28 DIAGNOSIS — M6281 Muscle weakness (generalized): Secondary | ICD-10-CM

## 2023-09-28 DIAGNOSIS — R2689 Other abnormalities of gait and mobility: Secondary | ICD-10-CM

## 2023-09-28 NOTE — Telephone Encounter (Signed)
levothyroxine (SYNTHROID) 100 MCG tablet  Pt states she did not receive the correct quantity of tablets in prescriptions and have questions about her getting another refill with the correct amount or is there a reason why she received a lesser amount.Marland Kitchen

## 2023-09-28 NOTE — Therapy (Signed)
OUTPATIENT PHYSICAL THERAPY TREATMENT NOTE     Patient Name: Gloria [name] MRN: [id number] DOB:1951-01-24, 72 y.o., female Today's Date: 09/28/2023  END OF SESSION:  PT End of Session - 09/28/23 1410     Visit Number 11    Number of Visits 24    Date for PT Re-Evaluation 10/24/23    Authorization Type [employer] MCR    Authorization Time Period [employer] MCR/MCD    PT Start Time 0206    PT Stop Time 0301    PT Time Calculation (min) 55 min                    Past Medical History:  Diagnosis Date   Alopecia areata 10/2010 onset   Arthritis    Diabetes mellitus, type 2 (HCC)    no longer per pt   Dyslipidemia    Hyperlipidemia    on meds   Hypertension    on meds   Hypothyroid    on meds   Myasthenia gravis    Past Surgical History:  Procedure Laterality Date   ABDOMINAL HYSTERECTOMY  12/12/1988   CARPAL TUNNEL RELEASE Left    CARPAL TUNNEL RELEASE Right    COLONOSCOPY  2008   [facility]   TOTAL KNEE ARTHROPLASTY Right 07/14/2023   Procedure: RIGHT TOTAL KNEE ARTHROPLASTY;  Surgeon: Kathryne Hitch, MD;  Location: [facility];  Service: Orthopedics;  Laterality: Right;   Patient Active Problem List   Diagnosis Date Noted   Overactive bladder 09/06/2023   Status post total right knee replacement 07/14/23 07/14/2023   CKD (chronic kidney disease) stage 3, GFR 30-59 ml/min (HCC) 03/08/2023   Arthropathy of lumbar facet joint 11/14/2022   Bilateral low back pain without sciatica 09/29/2021   Bradycardia 08/20/2020   Acute cervical radiculopathy 07/09/2020   Pruritus 02/06/2020   Raynaud's phenomenon 01/06/2020   Left wrist tendinitis 04/30/2018   Hand numbness 01/24/2018   Lumbar radiculopathy 12/23/2016   Plantar fasciitis of right foot 06/22/2016   Carpal tunnel syndrome of right wrist 03/07/2013   Myasthenia gravis without exacerbation (HCC) 12/31/2010   Alopecia 12/31/2010   Hypothyroidism 12/29/2010   Type 2 diabetes, diet controlled (HCC)  12/29/2010   Hyperlipidemia 12/29/2010   Essential hypertension 12/29/2010   Coronary atherosclerosis 12/29/2010   Osteoarthritis 12/29/2010    PCP: Pincus Sanes, MD  REFERRING PROVIDER: Kathryne Hitch, MD  REFERRING DIAG:  [phone] (ICD-10-CM) - Status post total right knee replacement M17.11 (ICD-10-CM) - Unilateral primary osteoarthritis, right knee  THERAPY DIAG:  Acute pain of right knee  Muscle weakness (generalized)  Other abnormalities of gait and mobility  Rationale for Evaluation and Treatment: Rehabilitation  ONSET DATE: 07/14/2023  SUBJECTIVE:   SUBJECTIVE STATEMENT: Saw MD who said come back is 3 months. Pain 3-4/10 on arrival.   EVAL: Pt presents to PT s/p R TKA performed by Dr. Magnus Ivan on 07/14/2023. Had been seeing HHPT for last few weeks since after surgery. Has had difficulty with stair navigation and notes general increase in pain today. States she doesn't feel like she can do much due to pain, not sure why today is uncomfortable. Denies pain in calf, PT educated on s/s of DVT.   PAIN:  Are you having pain?  Yes: NPRS scale: 3-4/10 Worst: 10/10 Pain location: Right knee Pain description: sore, post exercise Aggravating factors: walking, standing Relieving factors: rest, ice  PERTINENT HISTORY: HTN, Myasthenia Gravis, DM II, CKD  PRECAUTIONS: None  RED FLAGS: None  WEIGHT BEARING RESTRICTIONS: No  FALLS:  Has patient fallen in last 6 months? No  LIVING ENVIRONMENT: Lives with: lives alone Lives in: House/apartment Stairs: Yes: Internal: 14 steps; on right going up and External: 2 steps; none Has following equipment at home: Walker - 2 wheeled  OCCUPATION: Retired  PLOF: Independent  PATIENT GOALS: improve right knee to be able to get back to walking and working out  OBJECTIVE:   DIAGNOSTIC FINDINGS: See imaging  PATIENT SURVEYS:  FOTO: 26% function; 54% predicted 09/21/23: 56% function  COGNITION: Overall cognitive  status: Within functional limits for tasks assessed     SENSATION: Light touch: Impaired - lateral R LE  EDEMA:  DNT  POSTURE: rounded shoulders and forward head  PALPATION: TTP to distal R quad, patellar hypomobility  LOWER EXTREMITY ROM:  Active ROM Right eval Rt 08/17/23 Rt 08/22/23 RT 09/01/23 RT 09/13/23 RT 09/21/23 RT 09/28/23  Knee flexion 72 sitting AA90d supine 92 AA 96 A/105 AA AA112 A 112 A 112  Knee extension 19 13d 10 d  10d A 6    (Blank rows = not tested)  LOWER EXTREMITY MMT:  MMT Right eval Left eval  Knee flexion DNT 4/5  Knee extension DNT 4/5   (Blank rows = not tested)  LOWER EXTREMITY SPECIAL TESTS:  DNT  FUNCTIONAL TESTS:  TUG: 41 seconds with FWW  09/21/2023: 19 seconds no AD 30 Second Sit to Stand: 4 reps with B UE  09/21/2023:   GAIT: Distance walked: 63ft Assistive device utilized: Environmental consultant - 2 wheeled Level of assistance: Modified independence Comments: antalgic gait R, decreased knee ext R   TREATMENT: [facility] Adult PT Treatment:                                                DATE: 09/28/23 Therapeutic Exercise: Rec Bike full forward revolutions x 4 minutes  Seated h/s stretch with strap LAQ 3# 10 x 2  SAQ 3# 10 x 2  Supine h/s stretch with strap  Supine QS with towel Heel slides AA Leg press 25#  Knee ext OMEGA 5# bilat Knee flex omega 15# bilateral Manual Therapy: H/L knee flexion and ext mobs , gentle oscillations to reduce pain  Therapeutic Activity: Up and down clinic stairs alternating with HRs Modalities: 10 minutes     [facility] Adult PT Treatment:                                                DATE: 09/21/23 Therapeutic Exercise: Nustep L5 x 5 min while taking subjective Seated hamstring stretch 3x20 sec LAQ 2x10 2# R Supine quad set 10 x 5 sec hold Supine heel slide x 10 - 5" hold SLR 2x10 Therapeutic Activity: Assessment of tests/measures, goals, and outcomes for progress note Manual Therapy: Seated  right knee mobs to improve knee motion Seated right knee flexion PROM Supine patellar mobs all directions Modalities: Ice pack x 10 minutes   [facility] Adult PT Treatment:  DATE: 09/18/23 Therapeutic Exercise: Nustep L4 x 5 min while taking subjective Seated hamstring stretch 3 x 20 sec Slant board calf stretch 3 x 20 sec Supine quad set 10 x 5 sec hold SLR x 10 LAQ 2 x 10 Manual: Seated right knee mobs to improve knee motion Seated right knee flexion PROM Supine patellar mobs all directions Modalities: Ice pack x 10 minutes    [facility] Adult PT Treatment:                                                DATE: 09/13/23 Therapeutic Exercise: Nustep L4 x 6 min Seated c foot on stool QS Seated hamstring/calf stretch with strap Supine heel slide AROM, AAROM QS x 10 SLR x 10  LAQ 2x 10 3# STS x 10- focus on equal weight Heel raise Modalities: Ice pack x 10 minutes      PATIENT EDUCATION:  Education details: continue HEP Person educated: Patient Education method: Explanation, Demonstration, and Handouts Education comprehension: verbalized understanding and returned demonstration  HOME EXERCISE PROGRAM: Access Code: [email or web address] URL: [email or web address] Date: 08/03/2023 Prepared by: Joellyn Rued  Exercises - Supine Quadricep Sets  - 4-5 x daily - 7 x weekly - 2 sets - 10 reps - 5 sec hold - Supine Heel Slide with Strap  - 4-5 x daily - 7 x weekly - 2 sets - 10 reps - 5 sec hold - Active Straight Leg Raise with Quad Set  - 4-5 x daily - 7 x weekly - 1 sets - 10 reps - 3 hold - Supine Knee Extension Strengthening  - 1 x daily - 7 x weekly - 1 sets - 10 reps - 3 hold - Seated Hamstring Stretch  - 4-5 x daily - 7 x weekly - 2 reps - 30 sec hold - Seated Heel Slide  - 4-5 x daily - 7 x weekly - 2 sets - 10 reps - 5 sec hold - Supine Ankle Pumps  - 4-5 x daily - 7 x weekly - 30 reps  ASSESSMENT:  CLINICAL IMPRESSION: Pt was  able to complete all prescribed exercises with no adverse effect, does have continued pain. MD pleased at f/u , will go back in 3 months. Pain is easily irritable with Rom exercises. She mentions returning to gym and was instructed in light weight gym machines she could try. Today was also her first day on recumbent bike making full revolutions but with increased pain. She is able to negotiate up and down clinic stairs with alternating patter and uses HR. She reports that she can do half way down her stair case with alternating pattern and then switches to step to pattern.  She continues to benefit from skilled PT, will continue to progress as able per POC.   OBJECTIVE IMPAIRMENTS: Abnormal gait, decreased activity tolerance, decreased balance, decreased mobility, difficulty walking, decreased ROM, decreased strength, and pain.   ACTIVITY LIMITATIONS: sitting, standing, squatting, sleeping, stairs, transfers, bed mobility, and locomotion level  PARTICIPATION LIMITATIONS: driving, shopping, community activity, occupation, and yard work  PERSONAL FACTORS: Fitness and 3+ comorbidities: HTN, Myasthenia Gravis, DM II, CKD  are also affecting patient's functional outcome.   REHAB POTENTIAL: Good  CLINICAL DECISION MAKING: Evolving/moderate complexity  EVALUATION COMPLEXITY: Moderate   GOALS: Goals reviewed with patient? No  SHORT TERM GOALS: Target date: 08/21/2023   Pt will  be compliant and knowledgeable with initial HEP for improved comfort and carryover Baseline: initial HEP given  Goal status: MET  2.  Pt will self report right knee pain no greater than 6/10 for improved comfort and functional ability Baseline: 10/10 at worst 09/01/23: can still reach 10/10 at night, better during day (5/10)  09/21/23 - 7/10 at night Goal status: IN PROGRESS  LONG TERM GOALS: Target date: 10/24/2023   Pt will improve FOTO function score to no less than 54% as proxy for functional improvement Baseline:  26% function 09/21/23: 56% function Goal status: MET   2.  Pt will self report right knee pain no greater than 3/10 for improved comfort and functional ability Baseline: 10/10 at worst Goal status: IN PROGRESS   3.  Pt will increase 30 Second Sit to Stand rep count to no less than 8 reps for improved balance, strength, and functional mobility Baseline: 4 reps  09/21/23: 6 reps Goal status: IN PROGRESS   4.  Pt will decrease TUG score to no greater than 18 seconds with least restrictive assistive device for improved safety and decreased fall risk Baseline: 41 seconds FWW 09/21/23: 19 seconds no AD Goal status: IN PROGRESS  5.  Pt will improve right knee AROM to at least range of 3-110 degrees for improved comfort, mobility, and gait Baseline: see ROM chart 09/13/23:AA 10-112 09/21/23: A 6-112 Goal status: IN PROGRESS  PLAN:  PT FREQUENCY: 2x/week  PT DURATION: 12 weeks  PLANNED INTERVENTIONS: Therapeutic exercises, Therapeutic activity, Neuromuscular re-education, Balance training, Gait training, Patient/Family education, Self Care, Joint mobilization, Dry Needling, Electrical stimulation, Cryotherapy, Moist heat, Vasopneumatic device, Manual therapy, and Re-evaluation  PLAN FOR NEXT SESSION: assess HEP response, knee ROM, quad and hip strength, gait training , step ups,   Royden Purl PTA  09/28/23 2:52 PM

## 2023-09-28 NOTE — Telephone Encounter (Signed)
levothyroxine (SYNTHROID) 100 MCG tablet  Pt only received

## 2023-10-03 NOTE — Therapy (Incomplete)
OUTPATIENT PHYSICAL THERAPY TREATMENT NOTE     Patient Name: Gloria [name] MRN: [id number] DOB:1951-04-29, 72 y.o., female Today's Date: 10/03/2023  END OF SESSION:           Past Medical History:  Diagnosis Date   Alopecia areata 10/2010 onset   Arthritis    Diabetes mellitus, type 2 (HCC)    no longer per pt   Dyslipidemia    Hyperlipidemia    on meds   Hypertension    on meds   Hypothyroid    on meds   Myasthenia gravis    Past Surgical History:  Procedure Laterality Date   ABDOMINAL HYSTERECTOMY  12/12/1988   CARPAL TUNNEL RELEASE Left    CARPAL TUNNEL RELEASE Right    COLONOSCOPY  2008   [facility]   TOTAL KNEE ARTHROPLASTY Right 07/14/2023   Procedure: RIGHT TOTAL KNEE ARTHROPLASTY;  Surgeon: Kathryne Hitch, MD;  Location: [facility];  Service: Orthopedics;  Laterality: Right;   Patient Active Problem List   Diagnosis Date Noted   Overactive bladder 09/06/2023   Status post total right knee replacement 07/14/23 07/14/2023   CKD (chronic kidney disease) stage 3, GFR 30-59 ml/min (HCC) 03/08/2023   Arthropathy of lumbar facet joint 11/14/2022   Bilateral low back pain without sciatica 09/29/2021   Bradycardia 08/20/2020   Acute cervical radiculopathy 07/09/2020   Pruritus 02/06/2020   Raynaud's phenomenon 01/06/2020   Left wrist tendinitis 04/30/2018   Hand numbness 01/24/2018   Lumbar radiculopathy 12/23/2016   Plantar fasciitis of right foot 06/22/2016   Carpal tunnel syndrome of right wrist 03/07/2013   Myasthenia gravis without exacerbation (HCC) 12/31/2010   Alopecia 12/31/2010   Hypothyroidism 12/29/2010   Type 2 diabetes, diet controlled (HCC) 12/29/2010   Hyperlipidemia 12/29/2010   Essential hypertension 12/29/2010   Coronary atherosclerosis 12/29/2010   Osteoarthritis 12/29/2010    PCP: Pincus Sanes, MD  REFERRING PROVIDER: Kathryne Hitch, MD  REFERRING DIAG:  [phone] (ICD-10-CM) - Status post total right knee  replacement M17.11 (ICD-10-CM) - Unilateral primary osteoarthritis, right knee  THERAPY DIAG:  No diagnosis found.  Rationale for Evaluation and Treatment: Rehabilitation  ONSET DATE: 07/14/2023  SUBJECTIVE:  SUBJECTIVE STATEMENT: Saw MD who said come back is 3 months. Pain 3-4/10 on arrival.   EVAL: Pt presents to PT s/p R TKA performed by Dr. Magnus Ivan on 07/14/2023. Had been seeing HHPT for last few weeks since after surgery. Has had difficulty with stair navigation and notes general increase in pain today. States she doesn't feel like she can do much due to pain, not sure why today is uncomfortable. Denies pain in calf, PT educated on s/s of DVT.   PAIN:  Are you having pain?  Yes: NPRS scale: 3-4/10 Worst: 10/10 Pain location: Right knee Pain description: sore, post exercise Aggravating factors: walking, standing Relieving factors: rest, ice  PERTINENT HISTORY: HTN, Myasthenia Gravis, DM II, CKD  PRECAUTIONS: None  RED FLAGS: None   WEIGHT BEARING RESTRICTIONS: No  FALLS:  Has patient fallen in last 6 months? No  LIVING ENVIRONMENT: Lives with: lives alone Lives in: House/apartment Stairs: Yes: Internal: 14 steps; on right going up and External: 2 steps; none Has following equipment at home: Walker - 2 wheeled  OCCUPATION: Retired  PLOF: Independent  PATIENT GOALS: improve right knee to be able to get back to walking and working out  OBJECTIVE:  DIAGNOSTIC FINDINGS: See imaging  PATIENT SURVEYS:  FOTO: 26% function; 54% predicted 09/21/23: 56% function  COGNITION: Overall cognitive status: Within functional limits for tasks assessed     SENSATION: Light touch: Impaired - lateral R LE  EDEMA:  DNT  POSTURE: rounded shoulders and forward head  PALPATION: TTP to distal R quad, patellar hypomobility  LOWER EXTREMITY ROM:  Active ROM Right eval Rt 08/17/23 Rt 08/22/23 RT 09/01/23 RT 09/13/23 RT 09/21/23 RT 09/28/23  Knee flexion 72 sitting  AA90d supine 92 AA 96 A/105 AA AA112 A 112 A 112  Knee extension 19 13d 10 d  10d A 6    (Blank rows = not tested)  LOWER EXTREMITY MMT:  MMT Right eval Left eval  Knee flexion DNT 4/5  Knee extension DNT 4/5   (Blank rows = not tested)  LOWER EXTREMITY SPECIAL TESTS:  DNT  FUNCTIONAL TESTS:  TUG: 41 seconds with FWW  09/21/2023: 19 seconds no AD 30 Second Sit to Stand: 4 reps with B UE  09/21/2023:   GAIT: Distance walked: 71ft Assistive device utilized: Environmental consultant - 2 wheeled Level of assistance: Modified independence Comments: antalgic gait R, decreased knee ext R   TREATMENT: [facility] Adult PT Treatment:                                                DATE: 10/04/23 Therapeutic Exercise: Nustep L4 x 5 min while taking subjective Seated hamstring stretch 3 x 20 sec Slant board calf stretch 3 x 20 sec Supine quad set 10 x 5 sec hold SLR x 10 LAQ 2 x 10 Manual: Seated right knee mobs to improve knee motion Seated right knee flexion PROM Supine patellar mobs all directions Modalities: Ice pack x 10 minutes    [facility] Adult PT Treatment:                                                DATE: 09/28/23 Therapeutic Exercise: Rec Bike full forward revolutions x 4 minutes  Seated h/s stretch with strap LAQ 3# 10 x 2  SAQ 3# 10 x 2  Supine h/s stretch with strap  Supine QS with towel Heel slides AA Leg press 25#  Knee ext OMEGA 5# bilat Knee flex omega 15# bilateral Manual Therapy: H/L knee flexion and ext mobs , gentle oscillations to reduce pain Therapeutic Activity: Up and down clinic stairs alternating with HRs Modalities: 10 minutes   [facility] Adult PT Treatment:                                                DATE: 09/21/23 Therapeutic Exercise: Nustep L5 x 5 min while taking subjective Seated hamstring stretch 3x20 sec LAQ 2x10 2# R Supine quad set 10 x 5 sec hold Supine heel slide x 10 - 5" hold SLR 2x10 Therapeutic Activity: Assessment of tests/measures,  goals, and outcomes for progress note Manual Therapy: Seated right knee mobs to improve knee motion Seated right knee flexion PROM Supine patellar mobs all directions Modalities: Ice pack x 10 minutes   [facility] Adult PT Treatment:  DATE: 09/18/23 Therapeutic Exercise: Nustep L4 x 5 min while taking subjective Seated hamstring stretch 3 x 20 sec Slant board calf stretch 3 x 20 sec Supine quad set 10 x 5 sec hold SLR x 10 LAQ 2 x 10 Manual: Seated right knee mobs to improve knee motion Seated right knee flexion PROM Supine patellar mobs all directions Modalities: Ice pack x 10 minutes   [facility] Adult PT Treatment:                                                DATE: 09/13/23 Therapeutic Exercise: Nustep L4 x 6 min Seated c foot on stool QS Seated hamstring/calf stretch with strap Supine heel slide AROM, AAROM QS x 10 SLR x 10  LAQ 2x 10 3# STS x 10- focus on equal weight Heel raise Modalities: Ice pack x 10 minutes   PATIENT EDUCATION:  Education details: continue HEP Person educated: Patient Education method: Explanation, Demonstration, and Handouts Education comprehension: verbalized understanding and returned demonstration  HOME EXERCISE PROGRAM: Access Code: [email or web address] URL: [email or web address] Date: 08/03/2023 Prepared by: Joellyn Rued  Exercises - Supine Quadricep Sets  - 4-5 x daily - 7 x weekly - 2 sets - 10 reps - 5 sec hold - Supine Heel Slide with Strap  - 4-5 x daily - 7 x weekly - 2 sets - 10 reps - 5 sec hold - Active Straight Leg Raise with Quad Set  - 4-5 x daily - 7 x weekly - 1 sets - 10 reps - 3 hold - Supine Knee Extension Strengthening  - 1 x daily - 7 x weekly - 1 sets - 10 reps - 3 hold - Seated Hamstring Stretch  - 4-5 x daily - 7 x weekly - 2 reps - 30 sec hold - Seated Heel Slide  - 4-5 x daily - 7 x weekly - 2 sets - 10 reps - 5 sec hold - Supine Ankle Pumps  - 4-5 x daily - 7 x weekly -  30 reps  ASSESSMENT: CLINICAL IMPRESSION: Patient with fair tolerance for therapy due to knee pain, no adverse effects reported. Therapy focused on progressing her knee mobility and light quad strengthening. She reports continued knee pain with end range PROM and is guarded with manual therapy. She requires consistent cueing for quad activation with SLR to avoid lag. No changes made to HEP. Patient would benefit from continued skilled PT to progress her mobility and strength in order to reduce pain and maximize functional ability.   Pt was able to complete all prescribed exercises with no adverse effect, does have continued pain. MD pleased at f/u , will go back in 3 months. Pain is easily irritable with Rom exercises. She mentions returning to gym and was instructed in light weight gym machines she could try. Today was also her first day on recumbent bike making full revolutions but with increased pain. She is able to negotiate up and down clinic stairs with alternating patter and uses HR. She reports that she can do half way down her stair case with alternating pattern and then switches to step to pattern.  She continues to benefit from skilled PT, will continue to progress as able per POC.   OBJECTIVE IMPAIRMENTS: Abnormal gait, decreased activity tolerance, decreased balance, decreased mobility, difficulty walking, decreased ROM, decreased strength, and pain.   ACTIVITY LIMITATIONS:  sitting, standing, squatting, sleeping, stairs, transfers, bed mobility, and locomotion level  PARTICIPATION LIMITATIONS: driving, shopping, community activity, occupation, and yard work  PERSONAL FACTORS: Fitness and 3+ comorbidities: HTN, Myasthenia Gravis, DM II, CKD  are also affecting patient's functional outcome.   REHAB POTENTIAL: Good  CLINICAL DECISION MAKING: Evolving/moderate complexity  EVALUATION COMPLEXITY: Moderate   GOALS: Goals reviewed with patient? No  SHORT TERM GOALS: Target date:  08/21/2023   Pt will be compliant and knowledgeable with initial HEP for improved comfort and carryover Baseline: initial HEP given  Goal status: MET  2.  Pt will self report right knee pain no greater than 6/10 for improved comfort and functional ability Baseline: 10/10 at worst 09/01/23: can still reach 10/10 at night, better during day (5/10)  09/21/23 - 7/10 at night Goal status: IN PROGRESS  LONG TERM GOALS: Target date: 10/24/2023   Pt will improve FOTO function score to no less than 54% as proxy for functional improvement Baseline: 26% function 09/21/23: 56% function Goal status: MET   2.  Pt will self report right knee pain no greater than 3/10 for improved comfort and functional ability Baseline: 10/10 at worst Goal status: IN PROGRESS   3.  Pt will increase 30 Second Sit to Stand rep count to no less than 8 reps for improved balance, strength, and functional mobility Baseline: 4 reps  09/21/23: 6 reps Goal status: IN PROGRESS   4.  Pt will decrease TUG score to no greater than 18 seconds with least restrictive assistive device for improved safety and decreased fall risk Baseline: 41 seconds FWW 09/21/23: 19 seconds no AD Goal status: IN PROGRESS  5.  Pt will improve right knee AROM to at least range of 3-110 degrees for improved comfort, mobility, and gait Baseline: see ROM chart 09/13/23:AA 10-112 09/21/23: A 6-112 Goal status: IN PROGRESS   PLAN: PT FREQUENCY: 2x/week  PT DURATION: 12 weeks  PLANNED INTERVENTIONS: Therapeutic exercises, Therapeutic activity, Neuromuscular re-education, Balance training, Gait training, Patient/Family education, Self Care, Joint mobilization, Dry Needling, Electrical stimulation, Cryotherapy, Moist heat, Vasopneumatic device, Manual therapy, and Re-evaluation  PLAN FOR NEXT SESSION: assess HEP response, knee ROM, quad and hip strength, gait training , step ups,    Rosana Hoes, PT, DPT, LAT, ATC 10/03/23  4:11 PM Phone:  [phone] Fax: [phone]

## 2023-10-04 ENCOUNTER — Other Ambulatory Visit: Payer: Self-pay

## 2023-10-04 ENCOUNTER — Ambulatory Visit: Payer: Medicare HMO | Admitting: Physical Therapy

## 2023-10-04 ENCOUNTER — Encounter: Payer: Self-pay | Admitting: Physical Therapy

## 2023-10-04 DIAGNOSIS — R2689 Other abnormalities of gait and mobility: Secondary | ICD-10-CM | POA: Diagnosis not present

## 2023-10-04 DIAGNOSIS — M25561 Pain in right knee: Secondary | ICD-10-CM

## 2023-10-04 DIAGNOSIS — M6281 Muscle weakness (generalized): Secondary | ICD-10-CM

## 2023-10-04 MED ORDER — LEVOTHYROXINE SODIUM 100 MCG PO TABS: ORAL_TABLET | ORAL | 1 refills | Status: DC

## 2023-10-04 NOTE — Telephone Encounter (Signed)
Spoke with patient today. 

## 2023-10-04 NOTE — Therapy (Signed)
OUTPATIENT PHYSICAL THERAPY TREATMENT NOTE     Patient Name: Gloria [name] MRN: [id number] DOB:Oct 27, 1951, 72 y.o., female Today's Date: 10/04/2023  END OF SESSION:  PT End of Session - 10/04/23 1411     Visit Number 12    Number of Visits 24    Date for PT Re-Evaluation 10/24/23    Authorization Type [employer] MCR    Authorization Time Period [employer] MCR/MCD    PT Start Time 0210   pt late   PT Stop Time 0250    PT Time Calculation (min) 40 min                    Past Medical History:  Diagnosis Date   Alopecia areata 10/2010 onset   Arthritis    Diabetes mellitus, type 2 (HCC)    no longer per pt   Dyslipidemia    Hyperlipidemia    on meds   Hypertension    on meds   Hypothyroid    on meds   Myasthenia gravis    Past Surgical History:  Procedure Laterality Date   ABDOMINAL HYSTERECTOMY  12/12/1988   CARPAL TUNNEL RELEASE Left    CARPAL TUNNEL RELEASE Right    COLONOSCOPY  2008   [facility]   TOTAL KNEE ARTHROPLASTY Right 07/14/2023   Procedure: RIGHT TOTAL KNEE ARTHROPLASTY;  Surgeon: Kathryne Hitch, MD;  Location: [facility];  Service: Orthopedics;  Laterality: Right;   Patient Active Problem List   Diagnosis Date Noted   Overactive bladder 09/06/2023   Status post total right knee replacement 07/14/23 07/14/2023   CKD (chronic kidney disease) stage 3, GFR 30-59 ml/min (HCC) 03/08/2023   Arthropathy of lumbar facet joint 11/14/2022   Bilateral low back pain without sciatica 09/29/2021   Bradycardia 08/20/2020   Acute cervical radiculopathy 07/09/2020   Pruritus 02/06/2020   Raynaud's phenomenon 01/06/2020   Left wrist tendinitis 04/30/2018   Hand numbness 01/24/2018   Lumbar radiculopathy 12/23/2016   Plantar fasciitis of right foot 06/22/2016   Carpal tunnel syndrome of right wrist 03/07/2013   Myasthenia gravis without exacerbation (HCC) 12/31/2010   Alopecia 12/31/2010   Hypothyroidism 12/29/2010   Type 2 diabetes, diet controlled (HCC)  12/29/2010   Hyperlipidemia 12/29/2010   Essential hypertension 12/29/2010   Coronary atherosclerosis 12/29/2010   Osteoarthritis 12/29/2010    PCP: Pincus Sanes, MD  REFERRING PROVIDER: Kathryne Hitch, MD  REFERRING DIAG:  [phone] (ICD-10-CM) - Status post total right knee replacement M17.11 (ICD-10-CM) - Unilateral primary osteoarthritis, right knee  THERAPY DIAG:  Acute pain of right knee  Muscle weakness (generalized)  Other abnormalities of gait and mobility  Rationale for Evaluation and Treatment: Rehabilitation  ONSET DATE: 07/14/2023  SUBJECTIVE:   SUBJECTIVE STATEMENT: Pt has been having stomach pains at night. She is waking at 5 am with knee pain.   EVAL: Pt presents to PT s/p R TKA performed by Dr. Magnus Ivan on 07/14/2023. Had been seeing HHPT for last few weeks since after surgery. Has had difficulty with stair navigation and notes general increase in pain today. States she doesn't feel like she can do much due to pain, not sure why today is uncomfortable. Denies pain in calf, PT educated on s/s of DVT.   PAIN:  Are you having pain?  Yes: NPRS scale: 5-6/10 Worst: 10/10 Pain location: Right knee Pain description: sore, post exercise Aggravating factors: walking, standing Relieving factors: rest, ice  PERTINENT HISTORY: HTN, Myasthenia Gravis, DM II, CKD  PRECAUTIONS: None  RED FLAGS: None   WEIGHT BEARING RESTRICTIONS: No  FALLS:  Has patient fallen in last 6 months? No  LIVING ENVIRONMENT: Lives with: lives alone Lives in: House/apartment Stairs: Yes: Internal: 14 steps; on right going up and External: 2 steps; none Has following equipment at home: Walker - 2 wheeled  OCCUPATION: Retired  PLOF: Independent  PATIENT GOALS: improve right knee to be able to get back to walking and working out  OBJECTIVE:   DIAGNOSTIC FINDINGS: See imaging  PATIENT SURVEYS:  FOTO: 26% function; 54% predicted 09/21/23: 56%  function  COGNITION: Overall cognitive status: Within functional limits for tasks assessed     SENSATION: Light touch: Impaired - lateral R LE  EDEMA:  DNT  POSTURE: rounded shoulders and forward head  PALPATION: TTP to distal R quad, patellar hypomobility  LOWER EXTREMITY ROM:  Active ROM Right eval Rt 08/17/23 Rt 08/22/23 RT 09/01/23 RT 09/13/23 RT 09/21/23 RT 09/28/23 RT  10/04/23  Knee flexion 72 sitting AA90d supine 92 AA 96 A/105 AA AA112 A 112 A 112 A 120  Knee extension 19 13d 10 d  10d A 6  A 4    (Blank rows = not tested)  LOWER EXTREMITY MMT:  MMT Right eval Left eval  Knee flexion DNT 4/5  Knee extension DNT 4/5   (Blank rows = not tested)  LOWER EXTREMITY SPECIAL TESTS:  DNT  FUNCTIONAL TESTS:  TUG: 41 seconds with FWW  09/21/2023: 19 seconds no AD 30 Second Sit to Stand: 4 reps with B UE  09/21/2023:   GAIT: Distance walked: 60ft Assistive device utilized: Environmental consultant - 2 wheeled Level of assistance: Modified independence Comments: antalgic gait R, decreased knee ext R   TREATMENT: [facility] Adult PT Treatment:                                                DATE: 10/04/23 Therapeutic Exercise: Rec Bike L1 x 5 minutes  Knee ext 10# bilat x 12 Knee flexion bilat 20# x 12  Leg press seated 35# bilat x 15 QS 5 sec x 10  Manual Therapy: A/P mobs in HL , patella mobs all directions, PROM knee flexion and extension  Modalities: 10 minutes  Self Care: Self Patella mobs instruction    [facility] Adult PT Treatment:                                                DATE: 09/28/23 Therapeutic Exercise: Rec Bike full forward revolutions x 4 minutes  Seated h/s stretch with strap LAQ 3# 10 x 2  SAQ 3# 10 x 2  Supine h/s stretch with strap  Supine QS with towel Heel slides AA Leg press 25#  Knee ext OMEGA 5# bilat Knee flex omega 15# bilateral Manual Therapy: H/L knee flexion and ext mobs , gentle oscillations to reduce pain  Therapeutic  Activity: Up and down clinic stairs alternating with HRs Modalities: 10 minutes     [facility] Adult PT Treatment:  DATE: 09/21/23 Therapeutic Exercise: Nustep L5 x 5 min while taking subjective Seated hamstring stretch 3x20 sec LAQ 2x10 2# R Supine quad set 10 x 5 sec hold Supine heel slide x 10 - 5" hold SLR 2x10 Therapeutic Activity: Assessment of tests/measures, goals, and outcomes for progress note Manual Therapy: Seated right knee mobs to improve knee motion Seated right knee flexion PROM Supine patellar mobs all directions Modalities: Ice pack x 10 minutes   [facility] Adult PT Treatment:                                                DATE: 09/18/23 Therapeutic Exercise: Nustep L4 x 5 min while taking subjective Seated hamstring stretch 3 x 20 sec Slant board calf stretch 3 x 20 sec Supine quad set 10 x 5 sec hold SLR x 10 LAQ 2 x 10 Manual: Seated right knee mobs to improve knee motion Seated right knee flexion PROM Supine patellar mobs all directions Modalities: Ice pack x 10 minutes    [facility] Adult PT Treatment:                                                DATE: 09/13/23 Therapeutic Exercise: Nustep L4 x 6 min Seated c foot on stool QS Seated hamstring/calf stretch with strap Supine heel slide AROM, AAROM QS x 10 SLR x 10  LAQ 2x 10 3# STS x 10- focus on equal weight Heel raise Modalities: Ice pack x 10 minutes      PATIENT EDUCATION:  Education details: continue HEP Person educated: Patient Education method: Explanation, Demonstration, and Handouts Education comprehension: verbalized understanding and returned demonstration  HOME EXERCISE PROGRAM: Access Code: [email or web address] URL: [email or web address] Date: 08/03/2023 Prepared by: Joellyn Rued  Exercises  - Supine Quadricep Sets  - 4-5 x daily - 7 x weekly - 2 sets - 10 reps - 5 sec hold - Supine Heel Slide with Strap  - 4-5 x daily - 7 x weekly -  2 sets - 10 reps - 5 sec hold - Active Straight Leg Raise with Quad Set  - 4-5 x daily - 7 x weekly - 1 sets - 10 reps - 3 hold - Supine Knee Extension Strengthening  - 1 x daily - 7 x weekly - 1 sets - 10 reps - 3 hold - Seated Hamstring Stretch  - 4-5 x daily - 7 x weekly - 2 reps - 30 sec hold - Seated Heel Slide  - 4-5 x daily - 7 x weekly - 2 sets - 10 reps - 5 sec hold - Supine Ankle Pumps  - 4-5 x daily - 7 x weekly - 30 reps  ASSESSMENT:  CLINICAL IMPRESSION: Pt was able to complete all prescribed exercises with no adverse effect, does have continued pain, especially with sleep and first rise. Continued with rec bike which is better tolerated today. Also continued gym machines which she tolerates more resistance well. ROM measured after therex today and her active knee flexion was improved to 120 degrees. Her knee extension remains limited by about 5 degrees but did improve during patella mobs and cues to relax muscles. She does seem to have most pain in a supine position. She was instructed in  self patella mobs to assist in reducing pain with knee rom.  Will reinforce at next session.  She continues to benefit from skilled PT, will continue to progress as able per POC.   OBJECTIVE IMPAIRMENTS: Abnormal gait, decreased activity tolerance, decreased balance, decreased mobility, difficulty walking, decreased ROM, decreased strength, and pain.   ACTIVITY LIMITATIONS: sitting, standing, squatting, sleeping, stairs, transfers, bed mobility, and locomotion level  PARTICIPATION LIMITATIONS: driving, shopping, community activity, occupation, and yard work  PERSONAL FACTORS: Fitness and 3+ comorbidities: HTN, Myasthenia Gravis, DM II, CKD  are also affecting patient's functional outcome.   REHAB POTENTIAL: Good  CLINICAL DECISION MAKING: Evolving/moderate complexity  EVALUATION COMPLEXITY: Moderate   GOALS: Goals reviewed with patient? No  SHORT TERM GOALS: Target date: 08/21/2023   Pt  will be compliant and knowledgeable with initial HEP for improved comfort and carryover Baseline: initial HEP given  Goal status: MET  2.  Pt will self report right knee pain no greater than 6/10 for improved comfort and functional ability Baseline: 10/10 at worst 09/01/23: can still reach 10/10 at night, better during day (5/10)  09/21/23 - 7/10 at night Goal status: IN PROGRESS  LONG TERM GOALS: Target date: 10/24/2023   Pt will improve FOTO function score to no less than 54% as proxy for functional improvement Baseline: 26% function 09/21/23: 56% function Goal status: MET   2.  Pt will self report right knee pain no greater than 3/10 for improved comfort and functional ability Baseline: 10/10 at worst Goal status: IN PROGRESS   3.  Pt will increase 30 Second Sit to Stand rep count to no less than 8 reps for improved balance, strength, and functional mobility Baseline: 4 reps  09/21/23: 6 reps Goal status: IN PROGRESS   4.  Pt will decrease TUG score to no greater than 18 seconds with least restrictive assistive device for improved safety and decreased fall risk Baseline: 41 seconds FWW 09/21/23: 19 seconds no AD Goal status: IN PROGRESS  5.  Pt will improve right knee AROM to at least range of 3-110 degrees for improved comfort, mobility, and gait Baseline: see ROM chart 09/13/23:AA 10-112 09/21/23: A 6-112 Goal status: IN PROGRESS  PLAN:  PT FREQUENCY: 2x/week  PT DURATION: 12 weeks  PLANNED INTERVENTIONS: Therapeutic exercises, Therapeutic activity, Neuromuscular re-education, Balance training, Gait training, Patient/Family education, Self Care, Joint mobilization, Dry Needling, Electrical stimulation, Cryotherapy, Moist heat, Vasopneumatic device, Manual therapy, and Re-evaluation  PLAN FOR NEXT SESSION: assess HEP response, knee ROM, quad and hip strength, gait training , step ups,   Jannette Spanner, PTA 10/04/23 3:42 PM Phone: [phone] Fax: [phone]

## 2023-10-06 ENCOUNTER — Ambulatory Visit (INDEPENDENT_AMBULATORY_CARE_PROVIDER_SITE_OTHER): Payer: Medicare HMO | Admitting: Internal Medicine

## 2023-10-06 ENCOUNTER — Encounter: Payer: Self-pay | Admitting: Internal Medicine

## 2023-10-06 VITALS — BP 136/72 | HR 80 | Temp 98.0°F | Ht 66.0 in | Wt 147.0 lb

## 2023-10-06 DIAGNOSIS — R109 Unspecified abdominal pain: Secondary | ICD-10-CM | POA: Insufficient documentation

## 2023-10-06 DIAGNOSIS — I10 Essential (primary) hypertension: Secondary | ICD-10-CM

## 2023-10-06 MED ORDER — BLOOD PRESSURE MONITOR AUTOMAT DEVI
1.0000 | 0 refills | Status: AC
Start: 2023-10-06 — End: ?

## 2023-10-06 NOTE — Assessment & Plan Note (Addendum)
Chronic Initial BP elevated - repeat is better Monitor BP at home-advised goal of less than 130/80 No change in medications Continue amlodipine 5 mg daily, telmisartan 80 mg daily

## 2023-10-06 NOTE — Progress Notes (Signed)
Subjective:    Patient ID: Gloria [name], female    DOB: [birth year], 72 y.o.   MRN: [id number]      HPI Gloria [name] is here for  Chief Complaint  Patient presents with   Abdominal Pain    Abdominal cramping  (better now) when eating     Micah Flesher out to eat Monday - chicken wings were tuff and she had a couple  Sunday she had a fish filet from [employer].    Symptoms started Monday evening about 7 ish.  Her abdomen was cramping.  She did not have diarrhea.  After having a BM she did feel better but still had cramping. It slowly improved over a couple of day.  She denied nausea and GERD.  She denies any F/C. She had a little decreased appetite.    Pepto helped.  She is feeling better today.    Medications and allergies reviewed with patient and updated if appropriate.  Current Outpatient Medications on File Prior to Visit  Medication Sig Dispense Refill   acetaminophen (TYLENOL) 500 MG tablet Take 500 mg by mouth every 6 (six) hours as needed for moderate pain or mild pain.     amLODipine (NORVASC) 5 MG tablet TAKE 1 TABLET(5 MG) BY MOUTH DAILY 90 tablet 2   Ascorbic Acid (VITAMIN C PO) Take 1,000 mg by mouth daily at 6 (six) AM.     aspirin 81 MG chewable tablet Chew 1 tablet (81 mg total) by mouth 2 (two) times daily. 30 tablet 0   atorvastatin (LIPITOR) 10 MG tablet TAKE 1 TABLET BY MOUTH DAILY AT 6 PM 90 tablet 2   BIOTIN 5000 PO Take 10,000 mcg by mouth daily. Hair, skin, Nails     Blood Pressure Monitor DEVI Use to check blood pressure once daily 1 each 0   Blood Pressure Monitoring (ADULT BLOOD PRESSURE CUFF LG) KIT 1 kit by Does not apply route daily. 1 kit 0   CALCIUM-MAGNESIUM-ZINC PO Take 1 tablet by mouth daily at 6 (six) AM.     celecoxib (CELEBREX) 200 MG capsule Take 1 capsule (200 mg total) by mouth 2 (two) times daily between meals as needed. 60 capsule 1   Cholecalciferol (VITAMIN D) 50 MCG (2000 UT) CAPS Take 2,000 Units by mouth daily.     Cyanocobalamin  (VITAMIN B 12 PO) Take 3,000 mcg by mouth daily at 6 (six) AM.     diclofenac Sodium (VOLTAREN) 1 % GEL Apply 4 g topically 4 (four) times daily. (Patient taking differently: Apply 4 g topically daily as needed (pain).) 100 g 5   Ferrous Sulfate Dried (SLOW RELEASE IRON) 45 MG TBCR Take 1 tablet by mouth daily.     levothyroxine (SYNTHROID) 100 MCG tablet TAKE 1 TABLET EVERY DAY 30 MINS PRIOR TO BREAKFAST 6 DAYS A WEEK AND 1/2 TABLET 1 DAY A WEEK 90 tablet 1   methocarbamol (ROBAXIN) 500 MG tablet Take 1 tablet (500 mg total) by mouth every 6 (six) hours as needed. 40 tablet 1   mirabegron ER (MYRBETRIQ) 25 MG TB24 tablet Take 1 tablet (25 mg total) by mouth daily. 90 tablet 1   Multiple Vitamins-Minerals (WOMENS 50+ MULTI VITAMIN PO) Take 1 tablet by mouth daily at 6 (six) AM.     mupirocin ointment (BACTROBAN) 2 % Apply to affected area daily and as needed. 30 g 0   Omega-3 1000 MG CAPS Take 1,000 mg by mouth daily.     oxyCODONE (OXY IR/ROXICODONE) 5  MG immediate release tablet Take 1 tablet (5 mg total) by mouth 2 (two) times daily as needed for moderate pain (pain score 4-6) (pain score 4-6). 30 tablet 0   Polyethyl Glycol-Propyl Glycol (SYSTANE) 0.4-0.3 % SOLN Place 1 drop into both eyes daily as needed (dry eyes).     telmisartan (MICARDIS) 80 MG tablet TAKE 1 TABLET(80 MG) BY MOUTH DAILY 90 tablet 1   tiZANidine (ZANAFLEX) 4 MG tablet Take 1 tablet (4 mg total) by mouth every 8 (eight) hours as needed for muscle spasms. 60 tablet 1   triamcinolone cream (KENALOG) 0.1 % APPLY TOPICALLY TO THE AFFECTED AREA TWICE DAILY (Patient taking differently: Apply 1 Application topically daily as needed (Irritation).) 30 g 0   No current facility-administered medications on file prior to visit.    Review of Systems     Objective:   Vitals:   10/06/23 1418 10/06/23 1453  BP: (!) 140/90 136/72  Pulse: 80   Temp: 98 F (36.7 C)   SpO2: 98%    BP Readings from Last 3 Encounters:  10/06/23  136/72  09/06/23 (!) 140/78  07/18/23 113/62   Wt Readings from Last 3 Encounters:  10/06/23 147 lb (66.7 kg)  09/06/23 145 lb (65.8 kg)  07/14/23 156 lb (70.8 kg)   Body mass index is 23.73 kg/m.    Physical Exam Constitutional:      General: She is not in acute distress.    Appearance: Normal appearance. She is well-developed. She is not ill-appearing.  HENT:     Head: Normocephalic and atraumatic.  Abdominal:     General: There is no distension.     Palpations: Abdomen is soft.     Tenderness: There is no abdominal tenderness. There is no guarding or rebound.  Skin:    General: Skin is warm and dry.  Neurological:     Mental Status: She is alert.            Assessment & Plan:    See Problem List for Assessment and Plan of chronic medical problems.

## 2023-10-06 NOTE — Assessment & Plan Note (Signed)
Acute Resolved at this point Started 4 nights ago after eating some chicken that did not taste right earlier that day Had cramping x few days - no diarrhea or nausea/fever Resolved now Possible gastroenteritis from food No further eval or treatment since symptoms are resolved

## 2023-10-06 NOTE — Patient Instructions (Addendum)
      Medications changes include :   none    

## 2023-10-11 ENCOUNTER — Encounter: Payer: Self-pay | Admitting: Physical Therapy

## 2023-10-11 ENCOUNTER — Ambulatory Visit: Payer: Medicare HMO | Admitting: Physical Therapy

## 2023-10-11 ENCOUNTER — Other Ambulatory Visit: Payer: Self-pay

## 2023-10-11 DIAGNOSIS — M6281 Muscle weakness (generalized): Secondary | ICD-10-CM

## 2023-10-11 DIAGNOSIS — M25561 Pain in right knee: Secondary | ICD-10-CM | POA: Diagnosis not present

## 2023-10-11 DIAGNOSIS — R2689 Other abnormalities of gait and mobility: Secondary | ICD-10-CM

## 2023-10-11 NOTE — Therapy (Signed)
OUTPATIENT PHYSICAL THERAPY TREATMENT NOTE     Patient Name: Gloria [name] MRN: [id number] DOB:19-Mar-1951, 72 y.o., female Today's Date: 10/11/2023  END OF SESSION:  PT End of Session - 10/11/23 1418     Visit Number 13    Number of Visits 24    Date for PT Re-Evaluation 10/24/23    Authorization Type [employer] MCR    Authorization Time Period 09/18/2023 - 11/04/2023    Authorization - Visit Number 5    Authorization - Number of Visits 12    PT Start Time 1413    PT Stop Time 1455    PT Time Calculation (min) 42 min    Activity Tolerance Patient tolerated treatment well    Behavior During Therapy WFL for tasks assessed/performed                     Past Medical History:  Diagnosis Date   Alopecia areata 10/2010 onset   Arthritis    Diabetes mellitus, type 2 (HCC)    no longer per pt   Dyslipidemia    Hyperlipidemia    on meds   Hypertension    on meds   Hypothyroid    on meds   Myasthenia gravis    Past Surgical History:  Procedure Laterality Date   ABDOMINAL HYSTERECTOMY  12/12/1988   CARPAL TUNNEL RELEASE Left    CARPAL TUNNEL RELEASE Right    COLONOSCOPY  2008   [facility]   TOTAL KNEE ARTHROPLASTY Right 07/14/2023   Procedure: RIGHT TOTAL KNEE ARTHROPLASTY;  Surgeon: Kathryne Hitch, MD;  Location: [facility];  Service: Orthopedics;  Laterality: Right;   Patient Active Problem List   Diagnosis Date Noted   Abdominal cramping 10/06/2023   Overactive bladder 09/06/2023   Status post total right knee replacement 07/14/23 07/14/2023   CKD (chronic kidney disease) stage 3, GFR 30-59 ml/min (HCC) 03/08/2023   Arthropathy of lumbar facet joint 11/14/2022   Bilateral low back pain without sciatica 09/29/2021   Bradycardia 08/20/2020   Acute cervical radiculopathy 07/09/2020   Pruritus 02/06/2020   Raynaud's phenomenon 01/06/2020   Left wrist tendinitis 04/30/2018   Hand numbness 01/24/2018   Lumbar radiculopathy 12/23/2016   Plantar fasciitis of  right foot 06/22/2016   Carpal tunnel syndrome of right wrist 03/07/2013   Myasthenia gravis without exacerbation (HCC) 12/31/2010   Alopecia 12/31/2010   Hypothyroidism 12/29/2010   Type 2 diabetes, diet controlled (HCC) 12/29/2010   Hyperlipidemia 12/29/2010   Essential hypertension 12/29/2010   Coronary atherosclerosis 12/29/2010   Osteoarthritis 12/29/2010    PCP: Pincus Sanes, MD  REFERRING PROVIDER: Kathryne Hitch, MD  REFERRING DIAG:  [phone] (ICD-10-CM) - Status post total right knee replacement M17.11 (ICD-10-CM) - Unilateral primary osteoarthritis, right knee  THERAPY DIAG:  Acute pain of right knee  Muscle weakness (generalized)  Other abnormalities of gait and mobility  Rationale for Evaluation and Treatment: Rehabilitation  ONSET DATE: 07/14/2023  SUBJECTIVE:   SUBJECTIVE STATEMENT: Patient reports her knee has been hurting all day because she hasn't been taking her pain medicine.  EVAL: Pt presents to PT s/p R TKA performed by Dr. Magnus Ivan on 07/14/2023. Had been seeing HHPT for last few weeks since after surgery. Has had difficulty with stair navigation and notes general increase in pain today. States she doesn't feel like she can do much due to pain, not sure why today is uncomfortable. Denies pain in calf, PT educated on s/s of DVT.   PAIN:  Are you having pain?  Yes: NPRS scale: 5/10 Worst: 10/10 Pain location: Right knee Pain description: sore, post exercise Aggravating factors: walking, standing Relieving factors: rest, ice  PERTINENT HISTORY: HTN, Myasthenia Gravis, DM II, CKD  PRECAUTIONS: None  RED FLAGS: None   WEIGHT BEARING RESTRICTIONS: No  FALLS:  Has patient fallen in last 6 months? No  LIVING ENVIRONMENT: Lives with: lives alone Lives in: House/apartment Stairs: Yes: Internal: 14 steps; on right going up and External: 2 steps; none Has following equipment at home: Walker - 2 wheeled  OCCUPATION: Retired  PLOF:  Independent  PATIENT GOALS: improve right knee to be able to get back to walking and working out  OBJECTIVE:   DIAGNOSTIC FINDINGS: See imaging  PATIENT SURVEYS:  FOTO: 26% function; 54% predicted 09/21/23: 56% function  COGNITION: Overall cognitive status: Within functional limits for tasks assessed     SENSATION: Light touch: Impaired - lateral R LE  EDEMA:  DNT  POSTURE: rounded shoulders and forward head  PALPATION: TTP to distal R quad, patellar hypomobility  LOWER EXTREMITY ROM:  Active ROM Right eval Rt 08/17/23 Rt 08/22/23 RT 09/01/23 RT 09/13/23 RT 09/21/23 RT 09/28/23 RT  10/04/23  Knee flexion 72 sitting AA90d supine 92 AA 96 A/105 AA AA112 A 112 A 112 A 120  Knee extension 19 13d 10 d  10d A 6  A 4    (Blank rows = not tested)  LOWER EXTREMITY MMT:  MMT Right eval Left eval  Knee flexion DNT 4/5  Knee extension DNT 4/5   (Blank rows = not tested)  LOWER EXTREMITY SPECIAL TESTS:  DNT  FUNCTIONAL TESTS:  TUG: 41 seconds with FWW  09/21/2023: 19 seconds no AD 30 Second Sit to Stand: 4 reps with B UE  GAIT: Distance walked: 75ft Assistive device utilized: Environmental consultant - 2 wheeled Level of assistance: Modified independence Comments: antalgic gait R, decreased knee ext R   TREATMENT: [facility] Adult PT Treatment:                                                DATE: 10/11/23 Therapeutic Exercise: Rec Bike L1 x 5 minutes  Quad set 10 x 5 sec SLR 2 x 5 Seated hamstring stretch 2 x 20 sec Slant board calf stretch 2 x 20 sec Knee extension machine 10# DL 2 x 10 Leg press (BATCA) 35# DL 2 x 10 Manual Therapy: Hooklying knee A/P mobs Supine patellar mobs all directions Supine knee PROM flexion and extension Modalities: Ice pack x 10 minutes post session   [facility] Adult PT Treatment:                                                DATE: 10/04/23 Therapeutic Exercise: Rec Bike L1 x 5 minutes  Knee ext 10# bilat x 12 Knee flexion bilat 20# x 12  Leg  press seated 35# bilat x 15 QS 5 sec x 10 Manual Therapy: A/P mobs in HL , patella mobs all directions, PROM knee flexion and extension Modalities: 10 minutes  Self Care: Self Patella mobs instruction    [facility] Adult PT Treatment:  DATE: 09/28/23 Therapeutic Exercise: Rec Bike full forward revolutions x 4 minutes  Seated h/s stretch with strap LAQ 3# 10 x 2  SAQ 3# 10 x 2  Supine h/s stretch with strap  Supine QS with towel Heel slides AA Leg press 25#  Knee ext OMEGA 5# bilat Knee flex omega 15# bilateral Manual Therapy: H/L knee flexion and ext mobs , gentle oscillations to reduce pain  Therapeutic Activity: Up and down clinic stairs alternating with HRs Modalities: 10 minutes   [facility] Adult PT Treatment:                                                DATE: 09/21/23 Therapeutic Exercise: Nustep L5 x 5 min while taking subjective Seated hamstring stretch 3x20 sec LAQ 2x10 2# R Supine quad set 10 x 5 sec hold Supine heel slide x 10 - 5" hold SLR 2x10 Therapeutic Activity: Assessment of tests/measures, goals, and outcomes for progress note Manual Therapy: Seated right knee mobs to improve knee motion Seated right knee flexion PROM Supine patellar mobs all directions Modalities: Ice pack x 10 minutes   [facility] Adult PT Treatment:                                                DATE: 09/18/23 Therapeutic Exercise: Nustep L4 x 5 min while taking subjective Seated hamstring stretch 3 x 20 sec Slant board calf stretch 3 x 20 sec Supine quad set 10 x 5 sec hold SLR x 10 LAQ 2 x 10 Manual: Seated right knee mobs to improve knee motion Seated right knee flexion PROM Supine patellar mobs all directions Modalities: Ice pack x 10 minutes   [facility] Adult PT Treatment:                                                DATE: 09/13/23 Therapeutic Exercise: Nustep L4 x 6 min Seated c foot on stool QS Seated hamstring/calf stretch with  strap Supine heel slide AROM, AAROM QS x 10 SLR x 10  LAQ 2x 10 3# STS x 10- focus on equal weight Heel raise Modalities: Ice pack x 10 minutes   PATIENT EDUCATION:  Education details: HEP Person educated: Patient Education method: Programmer, multimedia, Demonstration Education comprehension: verbalized understanding and returned demonstration  HOME EXERCISE PROGRAM: Access Code: [email or web address] URL: [email or web address] Date: 08/03/2023 Prepared by: Joellyn Rued  Exercises  - Supine Quadricep Sets  - 4-5 x daily - 7 x weekly - 2 sets - 10 reps - 5 sec hold - Supine Heel Slide with Strap  - 4-5 x daily - 7 x weekly - 2 sets - 10 reps - 5 sec hold - Active Straight Leg Raise with Quad Set  - 4-5 x daily - 7 x weekly - 1 sets - 10 reps - 3 hold - Supine Knee Extension Strengthening  - 1 x daily - 7 x weekly - 1 sets - 10 reps - 3 hold - Seated Hamstring Stretch  - 4-5 x daily - 7 x weekly - 2 reps - 30 sec hold - Seated Heel Slide  -  4-5 x daily - 7 x weekly - 2 sets - 10 reps - 5 sec hold - Supine Ankle Pumps  - 4-5 x daily - 7 x weekly - 30 reps  ASSESSMENT:  CLINICAL IMPRESSION: Patient tolerated therapy well with no adverse effects. Therapy focused on progressing right knee motion and strength with good tolerance. She does report ongoing pain primarily around the patella this visit. She was able to continue with machine strengthening and remained at same weight as last visit. No changes made to HEP. Patient would benefit from continued skilled PT to progress her mobility and strength in order to reduce pain and maximize functional ability.  OBJECTIVE IMPAIRMENTS: Abnormal gait, decreased activity tolerance, decreased balance, decreased mobility, difficulty walking, decreased ROM, decreased strength, and pain.   ACTIVITY LIMITATIONS: sitting, standing, squatting, sleeping, stairs, transfers, bed mobility, and locomotion level  PARTICIPATION LIMITATIONS: driving, shopping,  community activity, occupation, and yard work  PERSONAL FACTORS: Fitness and 3+ comorbidities: HTN, Myasthenia Gravis, DM II, CKD  are also affecting patient's functional outcome.   REHAB POTENTIAL: Good  CLINICAL DECISION MAKING: Evolving/moderate complexity  EVALUATION COMPLEXITY: Moderate   GOALS: Goals reviewed with patient? No  SHORT TERM GOALS: Target date: 08/21/2023   Pt will be compliant and knowledgeable with initial HEP for improved comfort and carryover Baseline: initial HEP given  Goal status: MET  2.  Pt will self report right knee pain no greater than 6/10 for improved comfort and functional ability Baseline: 10/10 at worst 09/01/23: can still reach 10/10 at night, better during day (5/10)  09/21/23 - 7/10 at night Goal status: IN PROGRESS  LONG TERM GOALS: Target date: 10/24/2023   Pt will improve FOTO function score to no less than 54% as proxy for functional improvement Baseline: 26% function 09/21/23: 56% function Goal status: MET   2.  Pt will self report right knee pain no greater than 3/10 for improved comfort and functional ability Baseline: 10/10 at worst Goal status: IN PROGRESS   3.  Pt will increase 30 Second Sit to Stand rep count to no less than 8 reps for improved balance, strength, and functional mobility Baseline: 4 reps  09/21/23: 6 reps Goal status: IN PROGRESS   4.  Pt will decrease TUG score to no greater than 18 seconds with least restrictive assistive device for improved safety and decreased fall risk Baseline: 41 seconds FWW 09/21/23: 19 seconds no AD Goal status: IN PROGRESS  5.  Pt will improve right knee AROM to at least range of 3-110 degrees for improved comfort, mobility, and gait Baseline: see ROM chart 09/13/23:AA 10-112 09/21/23: A 6-112 Goal status: IN PROGRESS  PLAN:  PT FREQUENCY: 2x/week  PT DURATION: 12 weeks  PLANNED INTERVENTIONS: Therapeutic exercises, Therapeutic activity, Neuromuscular re-education,  Balance training, Gait training, Patient/Family education, Self Care, Joint mobilization, Dry Needling, Electrical stimulation, Cryotherapy, Moist heat, Vasopneumatic device, Manual therapy, and Re-evaluation  PLAN FOR NEXT SESSION: assess HEP response, knee ROM, quad and hip strength, gait training , step ups,   Rosana Hoes, PT, DPT, LAT, ATC 10/11/23  2:57 PM Phone: [phone] Fax: [phone]

## 2023-10-13 ENCOUNTER — Ambulatory Visit: Payer: Medicare HMO | Attending: Orthopaedic Surgery

## 2023-10-13 DIAGNOSIS — R2689 Other abnormalities of gait and mobility: Secondary | ICD-10-CM | POA: Insufficient documentation

## 2023-10-13 DIAGNOSIS — M6281 Muscle weakness (generalized): Secondary | ICD-10-CM | POA: Diagnosis not present

## 2023-10-13 DIAGNOSIS — M25561 Pain in right knee: Secondary | ICD-10-CM | POA: Insufficient documentation

## 2023-10-13 NOTE — Therapy (Signed)
OUTPATIENT PHYSICAL THERAPY TREATMENT NOTE     Patient Name: Gloria [name] MRN: [id number] DOB:January 23, 1951, 72 y.o., female Today's Date: 10/13/2023  END OF SESSION:  PT End of Session - 10/13/23 1120     Visit Number 14    Number of Visits 24    Date for PT Re-Evaluation 10/24/23    Authorization Type [employer] MCR    Authorization Time Period 09/18/2023 - 11/04/2023    Authorization - Visit Number 6    Authorization - Number of Visits 12    PT Start Time 1116   arrived late   PT Stop Time 1156    PT Time Calculation (min) 40 min    Activity Tolerance Patient tolerated treatment well    Behavior During Therapy [facility] for tasks assessed/performed                      Past Medical History:  Diagnosis Date   Alopecia areata 10/2010 onset   Arthritis    Diabetes mellitus, type 2 (HCC)    no longer per pt   Dyslipidemia    Hyperlipidemia    on meds   Hypertension    on meds   Hypothyroid    on meds   Myasthenia gravis    Past Surgical History:  Procedure Laterality Date   ABDOMINAL HYSTERECTOMY  12/12/1988   CARPAL TUNNEL RELEASE Left    CARPAL TUNNEL RELEASE Right    COLONOSCOPY  2008   [facility]   TOTAL KNEE ARTHROPLASTY Right 07/14/2023   Procedure: RIGHT TOTAL KNEE ARTHROPLASTY;  Surgeon: Kathryne Hitch, MD;  Location: [facility];  Service: Orthopedics;  Laterality: Right;   Patient Active Problem List   Diagnosis Date Noted   Abdominal cramping 10/06/2023   Overactive bladder 09/06/2023   Status post total right knee replacement 07/14/23 07/14/2023   CKD (chronic kidney disease) stage 3, GFR 30-59 ml/min (HCC) 03/08/2023   Arthropathy of lumbar facet joint 11/14/2022   Bilateral low back pain without sciatica 09/29/2021   Bradycardia 08/20/2020   Acute cervical radiculopathy 07/09/2020   Pruritus 02/06/2020   Raynaud's phenomenon 01/06/2020   Left wrist tendinitis 04/30/2018   Hand numbness 01/24/2018   Lumbar radiculopathy 12/23/2016    Plantar fasciitis of right foot 06/22/2016   Carpal tunnel syndrome of right wrist 03/07/2013   Myasthenia gravis without exacerbation (HCC) 12/31/2010   Alopecia 12/31/2010   Hypothyroidism 12/29/2010   Type 2 diabetes, diet controlled (HCC) 12/29/2010   Hyperlipidemia 12/29/2010   Essential hypertension 12/29/2010   Coronary atherosclerosis 12/29/2010   Osteoarthritis 12/29/2010    PCP: Pincus Sanes, MD  REFERRING PROVIDER: Kathryne Hitch, MD  REFERRING DIAG:  [phone] (ICD-10-CM) - Status post total right knee replacement M17.11 (ICD-10-CM) - Unilateral primary osteoarthritis, right knee  THERAPY DIAG:  Acute pain of right knee  Muscle weakness (generalized)  Other abnormalities of gait and mobility  Rationale for Evaluation and Treatment: Rehabilitation  ONSET DATE: 07/14/2023  SUBJECTIVE:   SUBJECTIVE STATEMENT: Pt arrived late due to traffic. Presents to PT with reports of increased pain. Has been compliant with HEP per reports.   EVAL: Pt presents to PT s/p R TKA performed by Dr. Magnus Ivan on 07/14/2023. Had been seeing HHPT for last few weeks since after surgery. Has had difficulty with stair navigation and notes general increase in pain today. States she doesn't feel like she can do much due to pain, not sure why today is uncomfortable. Denies pain in calf,  PT educated on s/s of DVT.   PAIN:  Are you having pain?  Yes: NPRS scale: 5/10 Worst: 10/10 Pain location: Right knee Pain description: sore, post exercise Aggravating factors: walking, standing Relieving factors: rest, ice  PERTINENT HISTORY: HTN, Myasthenia Gravis, DM II, CKD  PRECAUTIONS: None  RED FLAGS: None   WEIGHT BEARING RESTRICTIONS: No  FALLS:  Has patient fallen in last 6 months? No  LIVING ENVIRONMENT: Lives with: lives alone Lives in: House/apartment Stairs: Yes: Internal: 14 steps; on right going up and External: 2 steps; none Has following equipment at home: Walker - 2  wheeled  OCCUPATION: Retired  PLOF: Independent  PATIENT GOALS: improve right knee to be able to get back to walking and working out  OBJECTIVE:   DIAGNOSTIC FINDINGS: See imaging  PATIENT SURVEYS:  FOTO: 26% function; 54% predicted 09/21/23: 56% function  COGNITION: Overall cognitive status: Within functional limits for tasks assessed     SENSATION: Light touch: Impaired - lateral R LE  EDEMA:  DNT  POSTURE: rounded shoulders and forward head  PALPATION: TTP to distal R quad, patellar hypomobility  LOWER EXTREMITY ROM:  Active ROM Right eval Rt 08/17/23 Rt 08/22/23 RT 09/01/23 RT 09/13/23 RT 09/21/23 RT 09/28/23 RT  10/04/23 RT 10/13/23  Knee flexion 72 sitting AA90d supine 92 AA 96 A/105 AA AA112 A 112 A 112 A 120 A 120  Knee extension 19 13d 10 d  10d A 6  A 4  A 3 After manual   (Blank rows = not tested)  LOWER EXTREMITY MMT:  MMT Right eval Left eval  Knee flexion DNT 4/5  Knee extension DNT 4/5   (Blank rows = not tested)  LOWER EXTREMITY SPECIAL TESTS:  DNT  FUNCTIONAL TESTS:  TUG: 41 seconds with FWW  09/21/2023: 19 seconds no AD 30 Second Sit to Stand: 4 reps with B UE  GAIT: Distance walked: 37ft Assistive device utilized: Environmental consultant - 2 wheeled Level of assistance: Modified independence Comments: antalgic gait R, decreased knee ext R   TREATMENT: [facility] Adult PT Treatment:                                                DATE: 10/13/23 Therapeutic Exercise: NuStep lvl 5 UE/LE x 3 min while taking subjective Quad set 10 x 5 sec SLR 3x5 Seated hamstring stretch 2x20 sec Step ups x 10 R leading 8in TKE with ball x 10 R - 5" hold LAQ 2x10 4# R Manual Therapy: Hooklying knee A/P mobs Supine patellar mobs all directions Supine knee PROM flexion and extension Modalities: Ice pack x 10 minutes post session  [facility] Adult PT Treatment:                                                DATE: 10/11/23 Therapeutic Exercise: Rec Bike L1 x 5  minutes  Quad set 10 x 5 sec SLR 2 x 5 Seated hamstring stretch 2 x 20 sec Slant board calf stretch 2 x 20 sec Knee extension machine 10# DL 2 x 10 Leg press (BATCA) 35# DL 2 x 10 Manual Therapy: Hooklying knee A/P mobs Supine patellar mobs all directions Supine knee PROM flexion and extension Modalities: Ice pack x  10 minutes post session   [facility] Adult PT Treatment:                                                DATE: 10/04/23 Therapeutic Exercise: Rec Bike L1 x 5 minutes  Knee ext 10# bilat x 12 Knee flexion bilat 20# x 12  Leg press seated 35# bilat x 15 QS 5 sec x 10 Manual Therapy: A/P mobs in HL , patella mobs all directions, PROM knee flexion and extension Modalities: 10 minutes  Self Care: Self Patella mobs instruction    [facility] Adult PT Treatment:                                                DATE: 09/28/23 Therapeutic Exercise: Rec Bike full forward revolutions x 4 minutes  Seated h/s stretch with strap LAQ 3# 10 x 2  SAQ 3# 10 x 2  Supine h/s stretch with strap  Supine QS with towel Heel slides AA Leg press 25#  Knee ext OMEGA 5# bilat Knee flex omega 15# bilateral Manual Therapy: H/L knee flexion and ext mobs , gentle oscillations to reduce pain Therapeutic Activity: Up and down clinic stairs alternating with HRs Modalities: 10 minutes   [facility] Adult PT Treatment:                                                DATE: 09/21/23 Therapeutic Exercise: Nustep L5 x 5 min while taking subjective Seated hamstring stretch 3x20 sec LAQ 2x10 2# R Supine quad set 10 x 5 sec hold Supine heel slide x 10 - 5" hold SLR 2x10 Therapeutic Activity: Assessment of tests/measures, goals, and outcomes for progress note Manual Therapy: Seated right knee mobs to improve knee motion Seated right knee flexion PROM Supine patellar mobs all directions Modalities: Ice pack x 10 minutes   [facility] Adult PT Treatment:                                                DATE:  09/18/23 Therapeutic Exercise: Nustep L4 x 5 min while taking subjective Seated hamstring stretch 3 x 20 sec Slant board calf stretch 3 x 20 sec Supine quad set 10 x 5 sec hold SLR x 10 LAQ 2 x 10 Manual: Seated right knee mobs to improve knee motion Seated right knee flexion PROM Supine patellar mobs all directions Modalities: Ice pack x 10 minutes   [facility] Adult PT Treatment:                                                DATE: 09/13/23 Therapeutic Exercise: Nustep L4 x 6 min Seated c foot on stool QS Seated hamstring/calf stretch with strap Supine heel slide AROM, AAROM QS x 10 SLR x 10  LAQ 2x 10 3# STS x 10- focus  on equal weight Heel raise Modalities: Ice pack x 10 minutes   PATIENT EDUCATION:  Education details: HEP Person educated: Patient Education method: Programmer, multimedia, Demonstration Education comprehension: verbalized understanding and returned demonstration  HOME EXERCISE PROGRAM: Access Code: [email or web address] URL: [email or web address] Date: 08/03/2023 Prepared by: Joellyn Rued  Exercises  - Supine Quadricep Sets  - 4-5 x daily - 7 x weekly - 2 sets - 10 reps - 5 sec hold - Supine Heel Slide with Strap  - 4-5 x daily - 7 x weekly - 2 sets - 10 reps - 5 sec hold - Active Straight Leg Raise with Quad Set  - 4-5 x daily - 7 x weekly - 1 sets - 10 reps - 3 hold - Supine Knee Extension Strengthening  - 1 x daily - 7 x weekly - 1 sets - 10 reps - 3 hold - Seated Hamstring Stretch  - 4-5 x daily - 7 x weekly - 2 reps - 30 sec hold - Seated Heel Slide  - 4-5 x daily - 7 x weekly - 2 sets - 10 reps - 5 sec hold - Supine Ankle Pumps  - 4-5 x daily - 7 x weekly - 30 reps  ASSESSMENT:  CLINICAL IMPRESSION: Pt was able to complete all prescribed exercises with no adverse effect, does have continued pain. Continues to progress well, shows improved knee ROM and mobility. Pt will continue to be seen per POC and will progress as able.   OBJECTIVE IMPAIRMENTS:  Abnormal gait, decreased activity tolerance, decreased balance, decreased mobility, difficulty walking, decreased ROM, decreased strength, and pain.   ACTIVITY LIMITATIONS: sitting, standing, squatting, sleeping, stairs, transfers, bed mobility, and locomotion level  PARTICIPATION LIMITATIONS: driving, shopping, community activity, occupation, and yard work  PERSONAL FACTORS: Fitness and 3+ comorbidities: HTN, Myasthenia Gravis, DM II, CKD  are also affecting patient's functional outcome.   REHAB POTENTIAL: Good  CLINICAL DECISION MAKING: Evolving/moderate complexity  EVALUATION COMPLEXITY: Moderate   GOALS: Goals reviewed with patient? No  SHORT TERM GOALS: Target date: 08/21/2023   Pt will be compliant and knowledgeable with initial HEP for improved comfort and carryover Baseline: initial HEP given  Goal status: MET  2.  Pt will self report right knee pain no greater than 6/10 for improved comfort and functional ability Baseline: 10/10 at worst 09/01/23: can still reach 10/10 at night, better during day (5/10)  09/21/23 - 7/10 at night Goal status: IN PROGRESS  LONG TERM GOALS: Target date: 10/24/2023   Pt will improve FOTO function score to no less than 54% as proxy for functional improvement Baseline: 26% function 09/21/23: 56% function Goal status: MET   2.  Pt will self report right knee pain no greater than 3/10 for improved comfort and functional ability Baseline: 10/10 at worst Goal status: IN PROGRESS   3.  Pt will increase 30 Second Sit to Stand rep count to no less than 8 reps for improved balance, strength, and functional mobility Baseline: 4 reps  09/21/23: 6 reps Goal status: IN PROGRESS   4.  Pt will decrease TUG score to no greater than 18 seconds with least restrictive assistive device for improved safety and decreased fall risk Baseline: 41 seconds FWW 09/21/23: 19 seconds no AD Goal status: IN PROGRESS  5.  Pt will improve right knee AROM to at  least range of 3-110 degrees for improved comfort, mobility, and gait Baseline: see ROM chart 09/13/23:AA 10-112 09/21/23: A 6-112 Goal status: IN PROGRESS  PLAN:  PT FREQUENCY:  2x/week  PT DURATION: 12 weeks  PLANNED INTERVENTIONS: Therapeutic exercises, Therapeutic activity, Neuromuscular re-education, Balance training, Gait training, Patient/Family education, Self Care, Joint mobilization, Dry Needling, Electrical stimulation, Cryotherapy, Moist heat, Vasopneumatic device, Manual therapy, and Re-evaluation  PLAN FOR NEXT SESSION: assess HEP response, knee ROM, quad and hip strength, gait training , step ups,   Eloy End PT  10/13/23 11:58 AM

## 2023-10-18 ENCOUNTER — Ambulatory Visit: Payer: Medicare HMO | Admitting: Physical Therapy

## 2023-10-18 ENCOUNTER — Encounter: Payer: Self-pay | Admitting: Physical Therapy

## 2023-10-18 ENCOUNTER — Other Ambulatory Visit: Payer: Self-pay

## 2023-10-18 ENCOUNTER — Telehealth: Payer: Self-pay | Admitting: *Deleted

## 2023-10-18 DIAGNOSIS — M6281 Muscle weakness (generalized): Secondary | ICD-10-CM | POA: Diagnosis not present

## 2023-10-18 DIAGNOSIS — M25561 Pain in right knee: Secondary | ICD-10-CM

## 2023-10-18 DIAGNOSIS — R2689 Other abnormalities of gait and mobility: Secondary | ICD-10-CM | POA: Diagnosis not present

## 2023-10-18 NOTE — Therapy (Signed)
OUTPATIENT PHYSICAL THERAPY TREATMENT NOTE     Patient Name: Gloria [name] MRN: [id number] DOB:Apr 23, 1951, 72 y.o., female Today's Date: 10/18/2023  END OF SESSION:  PT End of Session - 10/18/23 1421     Visit Number 15    Number of Visits 24    Date for PT Re-Evaluation 10/24/23    Authorization Type [employer] MCR    Authorization Time Period 09/18/2023 - 11/04/2023    Authorization - Visit Number 10    Authorization - Number of Visits 12    PT Start Time 1417    PT Stop Time 1455    PT Time Calculation (min) 38 min    Activity Tolerance Patient tolerated treatment well    Behavior During Therapy WFL for tasks assessed/performed                       Past Medical History:  Diagnosis Date   Alopecia areata 10/2010 onset   Arthritis    Diabetes mellitus, type 2 (HCC)    no longer per pt   Dyslipidemia    Hyperlipidemia    on meds   Hypertension    on meds   Hypothyroid    on meds   Myasthenia gravis    Past Surgical History:  Procedure Laterality Date   ABDOMINAL HYSTERECTOMY  12/12/1988   CARPAL TUNNEL RELEASE Left    CARPAL TUNNEL RELEASE Right    COLONOSCOPY  2008   [facility]   TOTAL KNEE ARTHROPLASTY Right 07/14/2023   Procedure: RIGHT TOTAL KNEE ARTHROPLASTY;  Surgeon: Kathryne Hitch, MD;  Location: [facility];  Service: Orthopedics;  Laterality: Right;   Patient Active Problem List   Diagnosis Date Noted   Abdominal cramping 10/06/2023   Overactive bladder 09/06/2023   Status post total right knee replacement 07/14/23 07/14/2023   CKD (chronic kidney disease) stage 3, GFR 30-59 ml/min (HCC) 03/08/2023   Arthropathy of lumbar facet joint 11/14/2022   Bilateral low back pain without sciatica 09/29/2021   Bradycardia 08/20/2020   Acute cervical radiculopathy 07/09/2020   Pruritus 02/06/2020   Raynaud's phenomenon 01/06/2020   Left wrist tendinitis 04/30/2018   Hand numbness 01/24/2018   Lumbar radiculopathy 12/23/2016   Plantar fasciitis  of right foot 06/22/2016   Carpal tunnel syndrome of right wrist 03/07/2013   Myasthenia gravis without exacerbation (HCC) 12/31/2010   Alopecia 12/31/2010   Hypothyroidism 12/29/2010   Type 2 diabetes, diet controlled (HCC) 12/29/2010   Hyperlipidemia 12/29/2010   Essential hypertension 12/29/2010   Coronary atherosclerosis 12/29/2010   Osteoarthritis 12/29/2010    PCP: Pincus Sanes, MD  REFERRING PROVIDER: Kathryne Hitch, MD  REFERRING DIAG:  [phone] (ICD-10-CM) - Status post total right knee replacement M17.11 (ICD-10-CM) - Unilateral primary osteoarthritis, right knee  THERAPY DIAG:  Acute pain of right knee  Muscle weakness (generalized)  Other abnormalities of gait and mobility  Rationale for Evaluation and Treatment: Rehabilitation  ONSET DATE: 07/14/2023  SUBJECTIVE:   SUBJECTIVE STATEMENT: Patient reports her right knee is hurting and stiff today.  EVAL: Pt presents to PT s/p R TKA performed by Dr. Magnus Ivan on 07/14/2023. Had been seeing HHPT for last few weeks since after surgery. Has had difficulty with stair navigation and notes general increase in pain today. States she doesn't feel like she can do much due to pain, not sure why today is uncomfortable. Denies pain in calf, PT educated on s/s of DVT.   PAIN:  Are you having pain?  Yes: NPRS scale: 4-5/10 Worst: 10/10 Pain location: Right knee Pain description: sore, post exercise Aggravating factors: walking, standing Relieving factors: rest, ice  PERTINENT HISTORY: HTN, Myasthenia Gravis, DM II, CKD  PRECAUTIONS: None  RED FLAGS: None   WEIGHT BEARING RESTRICTIONS: No  FALLS:  Has patient fallen in last 6 months? No  LIVING ENVIRONMENT: Lives with: lives alone Lives in: House/apartment Stairs: Yes: Internal: 14 steps; on right going up and External: 2 steps; none Has following equipment at home: Walker - 2 wheeled  OCCUPATION: Retired  PLOF: Independent  PATIENT GOALS: improve  right knee to be able to get back to walking and working out  OBJECTIVE:   DIAGNOSTIC FINDINGS: See imaging  PATIENT SURVEYS:  FOTO: 26% function; 54% predicted 09/21/23: 56% function  COGNITION: Overall cognitive status: Within functional limits for tasks assessed     SENSATION: Light touch: Impaired - lateral R LE  EDEMA:  DNT  POSTURE: rounded shoulders and forward head  PALPATION: TTP to distal R quad, patellar hypomobility  LOWER EXTREMITY ROM:  Active ROM Right eval Rt 08/17/23 Rt 08/22/23 RT 09/01/23 RT 09/13/23 RT 09/21/23 RT 09/28/23 RT  10/04/23 RT 10/13/23 Rt 10/18/23  Knee flexion 72 sitting AA90d supine 92 AA 96 A/105 AA AA112 A 112 A 112 A 120 A 120 120  Knee extension 19 13d 10 d  10d A 6  A 4  A 3 After manual    (Blank rows = not tested)  LOWER EXTREMITY MMT:  MMT Right eval Left eval  Knee flexion DNT 4/5  Knee extension DNT 4/5   (Blank rows = not tested)  LOWER EXTREMITY SPECIAL TESTS:  DNT  FUNCTIONAL TESTS:  TUG: 41 seconds with FWW  09/21/2023: 19 seconds no AD 30 Second Sit to Stand: 4 reps with B UE  GAIT: Distance walked: 22ft Assistive device utilized: Environmental consultant - 2 wheeled Level of assistance: Modified independence Comments: antalgic gait R, decreased knee ext R   TREATMENT: [facility] Adult PT Treatment:                                                DATE: 10/18/23 Therapeutic Exercise: Recumbent bike L2 x 3 min while taking subjective SLR 2 x 10 with right Bridge 3 x 6 Hooklying clamshell with red 2 x 10 Sidelying hip abduction 2 x 10 with right LAQ with 3# 3 x 10 on right Leg press (BATCA) 40# 3 x 10 Modalities: Ice pack x 10 minutes post session   [facility] Adult PT Treatment:                                                DATE: 10/13/23 Therapeutic Exercise: NuStep lvl 5 UE/LE x 3 min while taking subjective Quad set 10 x 5 sec SLR 3x5 Seated hamstring stretch 2x20 sec Step ups x 10 R leading 8in TKE with ball x 10 R  - 5" hold LAQ 2x10 4# R Manual Therapy: Hooklying knee A/P mobs Supine patellar mobs all directions Supine knee PROM flexion and extension Modalities: Ice pack x 10 minutes post session  [facility] Adult PT Treatment:  DATE: 10/13/23 Therapeutic Exercise: NuStep lvl 5 UE/LE x 3 min while taking subjective Quad set 10 x 5 sec SLR 3x5 Seated hamstring stretch 2x20 sec Step ups x 10 R leading 8in TKE with ball x 10 R - 5" hold LAQ 2x10 4# R Manual Therapy: Hooklying knee A/P mobs Supine patellar mobs all directions Supine knee PROM flexion and extension Modalities: Ice pack x 10 minutes post session   [facility] Adult PT Treatment:                                                DATE: 10/11/23 Therapeutic Exercise: Rec Bike L1 x 5 minutes  Quad set 10 x 5 sec SLR 2 x 5 Seated hamstring stretch 2 x 20 sec Slant board calf stretch 2 x 20 sec Knee extension machine 10# DL 2 x 10 Leg press (BATCA) 35# DL 2 x 10 Manual Therapy: Hooklying knee A/P mobs Supine patellar mobs all directions Supine knee PROM flexion and extension Modalities: Ice pack x 10 minutes post session  [facility] Adult PT Treatment:                                                DATE: 10/04/23 Therapeutic Exercise: Rec Bike L1 x 5 minutes  Knee ext 10# bilat x 12 Knee flexion bilat 20# x 12  Leg press seated 35# bilat x 15 QS 5 sec x 10 Manual Therapy: A/P mobs in HL , patella mobs all directions, PROM knee flexion and extension Modalities: 10 minutes  Self Care: Self Patella mobs instruction    [facility] Adult PT Treatment:                                                DATE: 09/28/23 Therapeutic Exercise: Rec Bike full forward revolutions x 4 minutes  Seated h/s stretch with strap LAQ 3# 10 x 2  SAQ 3# 10 x 2  Supine h/s stretch with strap  Supine QS with towel Heel slides AA Leg press 25#  Knee ext OMEGA 5# bilat Knee flex omega 15# bilateral Manual Therapy: H/L  knee flexion and ext mobs , gentle oscillations to reduce pain Therapeutic Activity: Up and down clinic stairs alternating with HRs Modalities: 10 minutes   [facility] Adult PT Treatment:                                                DATE: 09/21/23 Therapeutic Exercise: Nustep L5 x 5 min while taking subjective Seated hamstring stretch 3x20 sec LAQ 2x10 2# R Supine quad set 10 x 5 sec hold Supine heel slide x 10 - 5" hold SLR 2x10 Therapeutic Activity: Assessment of tests/measures, goals, and outcomes for progress note Manual Therapy: Seated right knee mobs to improve knee motion Seated right knee flexion PROM Supine patellar mobs all directions Modalities: Ice pack x 10 minutes   [facility] Adult PT Treatment:  DATE: 09/18/23 Therapeutic Exercise: Nustep L4 x 5 min while taking subjective Seated hamstring stretch 3 x 20 sec Slant board calf stretch 3 x 20 sec Supine quad set 10 x 5 sec hold SLR x 10 LAQ 2 x 10 Manual: Seated right knee mobs to improve knee motion Seated right knee flexion PROM Supine patellar mobs all directions Modalities: Ice pack x 10 minutes   [facility] Adult PT Treatment:                                                DATE: 09/13/23 Therapeutic Exercise: Nustep L4 x 6 min Seated c foot on stool QS Seated hamstring/calf stretch with strap Supine heel slide AROM, AAROM QS x 10 SLR x 10  LAQ 2x 10 3# STS x 10- focus on equal weight Heel raise Modalities: Ice pack x 10 minutes   PATIENT EDUCATION:  Education details: HEP Person educated: Patient Education method: Programmer, multimedia, Demonstration Education comprehension: verbalized understanding and returned demonstration  HOME EXERCISE PROGRAM: Access Code: [email or web address] URL: [email or web address] Date: 08/03/2023 Prepared by: Joellyn Rued  Exercises  - Supine Quadricep Sets  - 4-5 x daily - 7 x weekly - 2 sets - 10 reps - 5 sec hold - Supine Heel Slide  with Strap  - 4-5 x daily - 7 x weekly - 2 sets - 10 reps - 5 sec hold - Active Straight Leg Raise with Quad Set  - 4-5 x daily - 7 x weekly - 1 sets - 10 reps - 3 hold - Supine Knee Extension Strengthening  - 1 x daily - 7 x weekly - 1 sets - 10 reps - 3 hold - Seated Hamstring Stretch  - 4-5 x daily - 7 x weekly - 2 reps - 30 sec hold - Seated Heel Slide  - 4-5 x daily - 7 x weekly - 2 sets - 10 reps - 5 sec hold - Supine Ankle Pumps  - 4-5 x daily - 7 x weekly - 30 reps  ASSESSMENT:  CLINICAL IMPRESSION: Patient tolerated therapy well with no adverse effects. Patient arrived late to therapy and reports increased right knee pain and stiffness this visit. Therapy focused on progressing right knee strengthening and she was able to complete all prescribed exercises despite report of increase in pain. No changes made to HEP. Patient would benefit from continued skilled PT to progress her right knee mobility and strength in order to reduce pain and maximize functional ability.   OBJECTIVE IMPAIRMENTS: Abnormal gait, decreased activity tolerance, decreased balance, decreased mobility, difficulty walking, decreased ROM, decreased strength, and pain.   ACTIVITY LIMITATIONS: sitting, standing, squatting, sleeping, stairs, transfers, bed mobility, and locomotion level  PARTICIPATION LIMITATIONS: driving, shopping, community activity, occupation, and yard work  PERSONAL FACTORS: Fitness and 3+ comorbidities: HTN, Myasthenia Gravis, DM II, CKD  are also affecting patient's functional outcome.    GOALS: Goals reviewed with patient? No  SHORT TERM GOALS: Target date: 08/21/2023   Pt will be compliant and knowledgeable with initial HEP for improved comfort and carryover Baseline: initial HEP given  Goal status: MET  2.  Pt will self report right knee pain no greater than 6/10 for improved comfort and functional ability Baseline: 10/10 at worst 09/01/23: can still reach 10/10 at night, better during  day (5/10)  09/21/23 - 7/10 at night Goal status: IN PROGRESS  LONG TERM GOALS: Target date: 10/24/2023   Pt will improve FOTO function score to no less than 54% as proxy for functional improvement Baseline: 26% function 09/21/23: 56% function Goal status: MET   2.  Pt will self report right knee pain no greater than 3/10 for improved comfort and functional ability Baseline: 10/10 at worst Goal status: IN PROGRESS   3.  Pt will increase 30 Second Sit to Stand rep count to no less than 8 reps for improved balance, strength, and functional mobility Baseline: 4 reps  09/21/23: 6 reps Goal status: IN PROGRESS   4.  Pt will decrease TUG score to no greater than 18 seconds with least restrictive assistive device for improved safety and decreased fall risk Baseline: 41 seconds FWW 09/21/23: 19 seconds no AD Goal status: IN PROGRESS  5.  Pt will improve right knee AROM to at least range of 3-110 degrees for improved comfort, mobility, and gait Baseline: see ROM chart 09/13/23:AA 10-112 09/21/23: A 6-112 Goal status: IN PROGRESS  PLAN:  PT FREQUENCY: 2x/week  PT DURATION: 12 weeks  PLANNED INTERVENTIONS: Therapeutic exercises, Therapeutic activity, Neuromuscular re-education, Balance training, Gait training, Patient/Family education, Self Care, Joint mobilization, Dry Needling, Electrical stimulation, Cryotherapy, Moist heat, Vasopneumatic device, Manual therapy, and Re-evaluation  PLAN FOR NEXT SESSION: assess HEP response, knee ROM, quad and hip strength, gait training , step ups,    Rosana Hoes, PT, DPT, LAT, ATC 10/18/23  3:01 PM Phone: [phone] Fax: [phone]

## 2023-10-18 NOTE — Telephone Encounter (Signed)
Ortho bundle 90 day call completed. 

## 2023-10-19 NOTE — Therapy (Signed)
OUTPATIENT PHYSICAL THERAPY TREATMENT NOTE     Patient Name: Gloria [name] MRN: [id number] DOB:Dec 24, 1950, 72 y.o., female Today's Date: 10/20/2023   END OF SESSION:  PT End of Session - 10/20/23 1116     Visit Number 16    Number of Visits 24    Date for PT Re-Evaluation 10/24/23    Authorization Type [employer] MCR    Authorization Time Period 09/18/2023 - 11/04/2023    Authorization - Visit Number 11    Authorization - Number of Visits 12    PT Start Time 1113    PT Stop Time 1155    PT Time Calculation (min) 42 min    Activity Tolerance Patient tolerated treatment well    Behavior During Therapy WFL for tasks assessed/performed                        Past Medical History:  Diagnosis Date   Alopecia areata 10/2010 onset   Arthritis    Diabetes mellitus, type 2 (HCC)    no longer per pt   Dyslipidemia    Hyperlipidemia    on meds   Hypertension    on meds   Hypothyroid    on meds   Myasthenia gravis    Past Surgical History:  Procedure Laterality Date   ABDOMINAL HYSTERECTOMY  12/12/1988   CARPAL TUNNEL RELEASE Left    CARPAL TUNNEL RELEASE Right    COLONOSCOPY  2008   [facility]   TOTAL KNEE ARTHROPLASTY Right 07/14/2023   Procedure: RIGHT TOTAL KNEE ARTHROPLASTY;  Surgeon: Kathryne Hitch, MD;  Location: [facility];  Service: Orthopedics;  Laterality: Right;   Patient Active Problem List   Diagnosis Date Noted   Abdominal cramping 10/06/2023   Overactive bladder 09/06/2023   Status post total right knee replacement 07/14/23 07/14/2023   CKD (chronic kidney disease) stage 3, GFR 30-59 ml/min (HCC) 03/08/2023   Arthropathy of lumbar facet joint 11/14/2022   Bilateral low back pain without sciatica 09/29/2021   Bradycardia 08/20/2020   Acute cervical radiculopathy 07/09/2020   Pruritus 02/06/2020   Raynaud's phenomenon 01/06/2020   Left wrist tendinitis 04/30/2018   Hand numbness 01/24/2018   Lumbar radiculopathy 12/23/2016   Plantar  fasciitis of right foot 06/22/2016   Carpal tunnel syndrome of right wrist 03/07/2013   Myasthenia gravis without exacerbation (HCC) 12/31/2010   Alopecia 12/31/2010   Hypothyroidism 12/29/2010   Type 2 diabetes, diet controlled (HCC) 12/29/2010   Hyperlipidemia 12/29/2010   Essential hypertension 12/29/2010   Coronary atherosclerosis 12/29/2010   Osteoarthritis 12/29/2010    PCP: Pincus Sanes, MD  REFERRING PROVIDER: Kathryne Hitch, MD  REFERRING DIAG:  [phone] (ICD-10-CM) - Status post total right knee replacement M17.11 (ICD-10-CM) - Unilateral primary osteoarthritis, right knee  THERAPY DIAG:  Acute pain of right knee  Muscle weakness (generalized)  Other abnormalities of gait and mobility  Rationale for Evaluation and Treatment: Rehabilitation  ONSET DATE: 07/14/2023   SUBJECTIVE:  SUBJECTIVE STATEMENT: Patient reports her right knee is better but still hurting.  EVAL: Pt presents to PT s/p R TKA performed by Dr. Magnus Ivan on 07/14/2023. Had been seeing HHPT for last few weeks since after surgery. Has had difficulty with stair navigation and notes general increase in pain today. States she doesn't feel like she can do much due to pain, not sure why today is uncomfortable. Denies pain in calf, PT educated on s/s of DVT.   PAIN:  Are you  having pain?  Yes: NPRS scale: 4/10 Worst: 10/10 Pain location: Right knee Pain description: sore, post exercise Aggravating factors: walking, standing Relieving factors: rest, ice  PERTINENT HISTORY: HTN, Myasthenia Gravis, DM II, CKD  PRECAUTIONS: None  RED FLAGS: None   WEIGHT BEARING RESTRICTIONS: No  FALLS:  Has patient fallen in last 6 months? No  LIVING ENVIRONMENT: Lives with: lives alone Lives in: House/apartment Stairs: Yes: Internal: 14 steps; on right going up and External: 2 steps; none Has following equipment at home: Walker - 2 wheeled  OCCUPATION: Retired  PLOF: Independent  PATIENT GOALS:  improve right knee to be able to get back to walking and working out   OBJECTIVE:  PATIENT SURVEYS:  FOTO: 26% function; 54% predicted 09/21/23: 56% function  COGNITION: Overall cognitive status: Within functional limits for tasks assessed     SENSATION: Light touch: Impaired - lateral R LE  EDEMA:  DNT  POSTURE: rounded shoulders and forward head  PALPATION: TTP to distal R quad, patellar hypomobility  LOWER EXTREMITY ROM:  Active ROM Right eval Rt 08/17/23 Rt 08/22/23 RT 09/01/23 RT 09/13/23 RT 09/21/23 RT 09/28/23 RT  10/04/23 RT 10/13/23 Rt 10/18/23  Knee flexion 72 sitting AA90d supine 92 AA 96 A/105 AA AA112 A 112 A 112 A 120 A 120 120  Knee extension 19 13d 10 d  10d A 6  A 4  A 3 After manual    (Blank rows = not tested)  LOWER EXTREMITY MMT:  MMT Right eval Left eval  Knee flexion DNT 4/5  Knee extension DNT 4/5   (Blank rows = not tested)  LOWER EXTREMITY SPECIAL TESTS:  DNT  FUNCTIONAL TESTS:  TUG: 41 seconds with FWW  09/21/2023: 19 seconds no AD 30 Second Sit to Stand: 4 reps with B UE  10/20/2023: 8 reps without UE support  GAIT: Distance walked: 75ft Assistive device utilized: Environmental consultant - 2 wheeled Level of assistance: Modified independence Comments: antalgic gait R, decreased knee ext R   TREATMENT: [facility] Adult PT Treatment:                                                DATE: 10/20/23 Therapeutic Exercise: Recumbent bike L2 x 3 min while taking subjective Knee extension machine 15# 3 x 10 Knee flexion machine 20# 2 x 10 Leg press (BATCA) 45# 3 x 10 Forward 6" step up 2 x 10 each Sidelying hip abduction 3 x 10 with right Bridge 2 x 10 Sit to stand 2 x 10 Standing TKE with green 2 x 10 Modalities: Ice pack x 10 minutes post session   [facility] Adult PT Treatment:                                                DATE: 10/18/23 Therapeutic Exercise: Recumbent bike L2 x 3 min while taking subjective SLR 2 x 10 with right Bridge 3 x  6 Hooklying clamshell with red 2 x 10 Sidelying hip abduction 2 x 10 with right LAQ with 3# 3 x 10 on right Leg press (BATCA) 40# 3 x 10 Modalities: Ice pack x 10 minutes post session  [facility] Adult PT Treatment:  DATE: 10/13/23 Therapeutic Exercise: NuStep lvl 5 UE/LE x 3 min while taking subjective Quad set 10 x 5 sec SLR 3x5 Seated hamstring stretch 2x20 sec Step ups x 10 R leading 8in TKE with ball x 10 R - 5" hold LAQ 2x10 4# R Manual Therapy: Hooklying knee A/P mobs Supine patellar mobs all directions Supine knee PROM flexion and extension Modalities: Ice pack x 10 minutes post session  [facility] Adult PT Treatment:                                                DATE: 10/13/23 Therapeutic Exercise: NuStep lvl 5 UE/LE x 3 min while taking subjective Quad set 10 x 5 sec SLR 3x5 Seated hamstring stretch 2x20 sec Step ups x 10 R leading 8in TKE with ball x 10 R - 5" hold LAQ 2x10 4# R Manual Therapy: Hooklying knee A/P mobs Supine patellar mobs all directions Supine knee PROM flexion and extension Modalities: Ice pack x 10 minutes post session  PATIENT EDUCATION:  Education details: HEP Person educated: Patient Education method: Programmer, multimedia, Demonstration Education comprehension: verbalized understanding and returned demonstration  HOME EXERCISE PROGRAM: Access Code: [email or web address] URL: [email or web address] Date: 08/03/2023 Prepared by: Joellyn Rued  Exercises  - Supine Quadricep Sets  - 4-5 x daily - 7 x weekly - 2 sets - 10 reps - 5 sec hold - Supine Heel Slide with Strap  - 4-5 x daily - 7 x weekly - 2 sets - 10 reps - 5 sec hold - Active Straight Leg Raise with Quad Set  - 4-5 x daily - 7 x weekly - 1 sets - 10 reps - 3 hold - Supine Knee Extension Strengthening  - 1 x daily - 7 x weekly - 1 sets - 10 reps - 3 hold - Seated Hamstring Stretch  - 4-5 x daily - 7 x weekly - 2 reps - 30 sec hold - Seated Heel  Slide  - 4-5 x daily - 7 x weekly - 2 sets - 10 reps - 5 sec hold - Supine Ankle Pumps  - 4-5 x daily - 7 x weekly - 30 reps   ASSESSMENT: CLINICAL IMPRESSION: Patient tolerated therapy well with no adverse effects. Therapy focused on strengthening for the right knee and hip. She does exhibit improvement in her 30 sec stand test, achieving that LTG, and she is progress well with her strengthening exercises in clinic. She does continue to report pain and stiffness of the right knee. No changes to HEP this visit. Concluded session with ice for pain reduction. Patient would benefit from continued skilled PT to progress her right knee mobility and strength in order to reduce pain and maximize functional ability.   OBJECTIVE IMPAIRMENTS: Abnormal gait, decreased activity tolerance, decreased balance, decreased mobility, difficulty walking, decreased ROM, decreased strength, and pain.   ACTIVITY LIMITATIONS: sitting, standing, squatting, sleeping, stairs, transfers, bed mobility, and locomotion level  PARTICIPATION LIMITATIONS: driving, shopping, community activity, occupation, and yard work  PERSONAL FACTORS: Fitness and 3+ comorbidities: HTN, Myasthenia Gravis, DM II, CKD  are also affecting patient's functional outcome.    GOALS: Goals reviewed with patient? No  SHORT TERM GOALS: Target date: 08/21/2023   Pt will be compliant and knowledgeable with initial HEP for improved comfort and carryover Baseline: initial HEP given  Goal status: MET  2.  Pt will  self report right knee pain no greater than 6/10 for improved comfort and functional ability Baseline: 10/10 at worst 09/01/23: can still reach 10/10 at night, better during day (5/10)  09/21/23 - 7/10 at night Goal status: IN PROGRESS  LONG TERM GOALS: Target date: 10/24/2023   Pt will improve FOTO function score to no less than 54% as proxy for functional improvement Baseline: 26% function 09/21/23: 56% function Goal status: MET    2.  Pt will self report right knee pain no greater than 3/10 for improved comfort and functional ability Baseline: 10/10 at worst Goal status: IN PROGRESS   3.  Pt will increase 30 Second Sit to Stand rep count to no less than 8 reps for improved balance, strength, and functional mobility Baseline: 4 reps  09/21/23: 6 reps 10/20/2023: 8 reps without UE support Goal status: MET  4.  Pt will decrease TUG score to no greater than 18 seconds with least restrictive assistive device for improved safety and decreased fall risk Baseline: 41 seconds FWW 09/21/23: 19 seconds no AD Goal status: IN PROGRESS  5.  Pt will improve right knee AROM to at least range of 3-110 degrees for improved comfort, mobility, and gait Baseline: see ROM chart 09/13/23:AA 10-112 09/21/23: A 6-112 Goal status: IN PROGRESS  PLAN: PT FREQUENCY: 2x/week  PT DURATION: 12 weeks  PLANNED INTERVENTIONS: Therapeutic exercises, Therapeutic activity, Neuromuscular re-education, Balance training, Gait training, Patient/Family education, Self Care, Joint mobilization, Dry Needling, Electrical stimulation, Cryotherapy, Moist heat, Vasopneumatic device, Manual therapy, and Re-evaluation  PLAN FOR NEXT SESSION: assess HEP response, knee ROM, quad and hip strength, gait training , step ups,    Rosana Hoes, PT, DPT, LAT, ATC 10/20/23  11:53 AM Phone: [phone] Fax: [phone]

## 2023-10-20 ENCOUNTER — Ambulatory Visit: Payer: Medicare HMO | Admitting: Physical Therapy

## 2023-10-20 ENCOUNTER — Encounter: Payer: Self-pay | Admitting: Physical Therapy

## 2023-10-20 ENCOUNTER — Other Ambulatory Visit: Payer: Self-pay

## 2023-10-20 DIAGNOSIS — R2689 Other abnormalities of gait and mobility: Secondary | ICD-10-CM

## 2023-10-20 DIAGNOSIS — M25561 Pain in right knee: Secondary | ICD-10-CM

## 2023-10-20 DIAGNOSIS — M6281 Muscle weakness (generalized): Secondary | ICD-10-CM

## 2023-10-23 ENCOUNTER — Ambulatory Visit: Payer: Medicare HMO | Admitting: Physical Therapy

## 2023-10-23 ENCOUNTER — Other Ambulatory Visit: Payer: Self-pay

## 2023-10-23 ENCOUNTER — Encounter: Payer: Self-pay | Admitting: Physical Therapy

## 2023-10-23 DIAGNOSIS — M6281 Muscle weakness (generalized): Secondary | ICD-10-CM | POA: Diagnosis not present

## 2023-10-23 DIAGNOSIS — R2689 Other abnormalities of gait and mobility: Secondary | ICD-10-CM

## 2023-10-23 DIAGNOSIS — M25561 Pain in right knee: Secondary | ICD-10-CM

## 2023-10-23 NOTE — Therapy (Signed)
OUTPATIENT PHYSICAL THERAPY TREATMENT NOTE     Patient Name: Gloria [name] MRN: [id number] DOB:[birth year], 72 y.o., female Today's Date: 10/23/2023   END OF SESSION:  PT End of Session - 10/23/23 1410     Visit Number 17    Number of Visits 25    Date for PT Re-Evaluation 12/04/23    Authorization Type [employer] MCR    Authorization Time Period 09/18/2023 - 11/04/2023    Authorization - Visit Number 12    Authorization - Number of Visits 12    PT Start Time 1406    PT Stop Time 1455    PT Time Calculation (min) 49 min    Activity Tolerance Patient tolerated treatment well    Behavior During Therapy WFL for tasks assessed/performed                         Past Medical History:  Diagnosis Date   Alopecia areata 10/2010 onset   Arthritis    Diabetes mellitus, type 2 (HCC)    no longer per pt   Dyslipidemia    Hyperlipidemia    on meds   Hypertension    on meds   Hypothyroid    on meds   Myasthenia gravis    Past Surgical History:  Procedure Laterality Date   ABDOMINAL HYSTERECTOMY  12/12/1988   CARPAL TUNNEL RELEASE Left    CARPAL TUNNEL RELEASE Right    COLONOSCOPY  2008   [facility]   TOTAL KNEE ARTHROPLASTY Right 07/14/2023   Procedure: RIGHT TOTAL KNEE ARTHROPLASTY;  Surgeon: Kathryne Hitch, MD;  Location: [facility];  Service: Orthopedics;  Laterality: Right;   Patient Active Problem List   Diagnosis Date Noted   Abdominal cramping 10/06/2023   Overactive bladder 09/06/2023   Status post total right knee replacement 07/14/23 07/14/2023   CKD (chronic kidney disease) stage 3, GFR 30-59 ml/min (HCC) 03/08/2023   Arthropathy of lumbar facet joint 11/14/2022   Bilateral low back pain without sciatica 09/29/2021   Bradycardia 08/20/2020   Acute cervical radiculopathy 07/09/2020   Pruritus 02/06/2020   Raynaud's phenomenon 01/06/2020   Left wrist tendinitis 04/30/2018   Hand numbness 01/24/2018   Lumbar radiculopathy 12/23/2016   Plantar  fasciitis of right foot 06/22/2016   Carpal tunnel syndrome of right wrist 03/07/2013   Myasthenia gravis without exacerbation (HCC) 12/31/2010   Alopecia 12/31/2010   Hypothyroidism 12/29/2010   Type 2 diabetes, diet controlled (HCC) 12/29/2010   Hyperlipidemia 12/29/2010   Essential hypertension 12/29/2010   Coronary atherosclerosis 12/29/2010   Osteoarthritis 12/29/2010    PCP: Pincus Sanes, MD  REFERRING PROVIDER: Kathryne Hitch, MD  REFERRING DIAG:  [phone] (ICD-10-CM) - Status post total right knee replacement M17.11 (ICD-10-CM) - Unilateral primary osteoarthritis, right knee  THERAPY DIAG:  Acute pain of right knee  Muscle weakness (generalized)  Other abnormalities of gait and mobility  Rationale for Evaluation and Treatment: Rehabilitation  ONSET DATE: 07/14/2023   SUBJECTIVE:  SUBJECTIVE STATEMENT: Patient reports she is doing ok today. She continues to have right knee pain.  EVAL: Pt presents to PT s/p R TKA performed by Dr. Magnus Ivan on 07/14/2023. Had been seeing HHPT for last few weeks since after surgery. Has had difficulty with stair navigation and notes general increase in pain today. States she doesn't feel like she can do much due to pain, not sure why today is uncomfortable. Denies pain in calf, PT educated on s/s of DVT.  PAIN:  Are you having pain?  Yes: NPRS scale: 6/10 Worst: 10/10 Pain location: Right knee Pain description: sore, post exercise Aggravating factors: walking, standing Relieving factors: rest, ice  PERTINENT HISTORY: HTN, Myasthenia Gravis, DM II, CKD  PRECAUTIONS: None  RED FLAGS: None   WEIGHT BEARING RESTRICTIONS: No  FALLS:  Has patient fallen in last 6 months? No  LIVING ENVIRONMENT: Lives with: lives alone Lives in: House/apartment Stairs: Yes: Internal: 14 steps; on right going up and External: 2 steps; none Has following equipment at home: Walker - 2 wheeled  OCCUPATION: Retired  PLOF:  Independent  PATIENT GOALS: improve right knee to be able to get back to walking and working out   OBJECTIVE:  PATIENT SURVEYS:  FOTO: 26% function; 54% predicted 09/21/23: 56% function  COGNITION: Overall cognitive status: Within functional limits for tasks assessed     SENSATION: Light touch: Impaired - lateral R LE  EDEMA:  DNT  POSTURE: rounded shoulders and forward head  PALPATION: TTP to distal R quad, patellar hypomobility  LOWER EXTREMITY ROM:  Active ROM Right eval Rt 08/17/23 Rt 08/22/23 RT 09/01/23 RT 09/13/23 RT 09/21/23 RT 09/28/23 RT  10/04/23 RT 10/13/23 Rt 10/18/23 Rt 10/23/23  Knee flexion 72 sitting AA90d supine 92 AA 96 A/105 AA AA112 A 112 A 112 A 120 A 120 120 120  Knee extension 19 13d 10 d  10d A 6  A 4  A 3 After manual  5   (Blank rows = not tested)  LOWER EXTREMITY MMT:  MMT Right eval Left eval Rt / Lt 10/23/2023  Knee flexion DNT 4/5 4 / 5  Knee extension DNT 4/5 4 / 5   (Blank rows = not tested)  LOWER EXTREMITY SPECIAL TESTS:  DNT  FUNCTIONAL TESTS:  TUG: 41 seconds with FWW  09/21/2023: 19 seconds no AD  10/23/2023: 15 seconds without AD 30 Second Sit to Stand: 4 reps with B UE  10/20/2023: 8 reps without UE support  GAIT: Distance walked: 85ft Assistive device utilized: Environmental consultant - 2 wheeled Level of assistance: Modified independence Comments: antalgic gait R, decreased knee ext R   TREATMENT: [facility] Adult PT Treatment:                                                DATE: 10/23/23 Therapeutic Exercise: Recumbent bike L1 x 5 min while taking subjective Knee extension machine 15# 3 x 10 Knee flexion machine 20# 2 x 10 Leg press (BATCA) 45# 3 x 10 Forward 6" step up 2 x 10 each Standing hip abduction with red at knees 2 x 10 each SLR 3 x 10 Bridge 2 x 10 Modalities: Ice pack x 10 minutes post session   [facility] Adult PT Treatment:                                                DATE: 10/20/23 Therapeutic  Exercise: Recumbent bike L2 x 3 min while taking subjective Knee extension machine 15# 3 x 10 Knee flexion machine 20# 2 x 10 Leg press (BATCA) 45# 3 x 10 Forward 6" step up 2 x 10 each Sidelying hip abduction 3 x 10 with right Bridge 2 x 10 Sit to stand  2 x 10 Standing TKE with green 2 x 10 Modalities: Ice pack x 10 minutes post session  [facility] Adult PT Treatment:                                                DATE: 10/18/23 Therapeutic Exercise: Recumbent bike L2 x 3 min while taking subjective SLR 2 x 10 with right Bridge 3 x 6 Hooklying clamshell with red 2 x 10 Sidelying hip abduction 2 x 10 with right LAQ with 3# 3 x 10 on right Leg press (BATCA) 40# 3 x 10 Modalities: Ice pack x 10 minutes post session  [facility] Adult PT Treatment:                                                DATE: 10/13/23 Therapeutic Exercise: NuStep lvl 5 UE/LE x 3 min while taking subjective Quad set 10 x 5 sec SLR 3x5 Seated hamstring stretch 2x20 sec Step ups x 10 R leading 8in TKE with ball x 10 R - 5" hold LAQ 2x10 4# R Manual Therapy: Hooklying knee A/P mobs Supine patellar mobs all directions Supine knee PROM flexion and extension Modalities: Ice pack x 10 minutes post session  [facility] Adult PT Treatment:                                                DATE: 10/13/23 Therapeutic Exercise: NuStep lvl 5 UE/LE x 3 min while taking subjective Quad set 10 x 5 sec SLR 3x5 Seated hamstring stretch 2x20 sec Step ups x 10 R leading 8in TKE with ball x 10 R - 5" hold LAQ 2x10 4# R Manual Therapy: Hooklying knee A/P mobs Supine patellar mobs all directions Supine knee PROM flexion and extension Modalities: Ice pack x 10 minutes post session  PATIENT EDUCATION:  Education details: POC extension, HEP Person educated: Patient Education method: Programmer, multimedia, Demonstration Education comprehension: verbalized understanding and returned demonstration  HOME EXERCISE PROGRAM: Access Code:  [email or web address]   ASSESSMENT: CLINICAL IMPRESSION: Patient tolerated therapy well with no adverse effects. Therapy continues to focus on progressing her LE strength. She does exhibit improvement in her knee strength and motion, but does continue to report increased right knee pain at rest and with activity. She has achieved her FOTO goal indicating improvement in her functional status, and has also achieved her other functional goals of 5xSTS and TUG. At this time her primary impairments are increased right knee pain, limitations with her right knee motion and strength. Overall she continues to improve with PT and patient would benefit from continued skilled PT to progress her right knee mobility and strength in order to reduce pain and maximize functional ability, so will extend PT POC for 6 more weeks.   OBJECTIVE IMPAIRMENTS: Abnormal gait, decreased activity tolerance, decreased balance, decreased mobility, difficulty walking, decreased ROM, decreased strength, and pain.   ACTIVITY LIMITATIONS: sitting, standing, squatting, sleeping, stairs, transfers, bed mobility, and locomotion level  PARTICIPATION LIMITATIONS: driving, shopping, community activity, occupation, and yard work  PERSONAL FACTORS: Fitness and 3+ comorbidities: HTN, Myasthenia Gravis, DM II, CKD  are also affecting patient's functional outcome.    GOALS: Goals reviewed with patient? No  SHORT TERM GOALS: Target date: = LTG   Pt will be compliant and knowledgeable with initial HEP for improved comfort and carryover Baseline: initial HEP given  Goal status: MET  2.  Pt will self report right knee pain no greater than 6/10 for improved comfort and functional ability Baseline: 10/10 at worst 09/01/23: can still reach 10/10 at night, better during day (5/10)  09/21/23 - 7/10 at night 10/23/2023: continues to report pain at worse 10/10 that wakes her up Goal status: ONGOING  LONG TERM GOALS: Target date: 12/04/2023   Pt will  improve FOTO function score to no less than 54% as proxy for functional improvement Baseline: 26% function 09/21/23: 56% function Goal status: MET   2.  Pt will self report right knee pain no greater than 3/10 for improved comfort and functional ability Baseline: 10/10 at worst 10/23/2023: 10/10 pain at worst Goal status: ONGOING  3.  Pt will increase 30 Second Sit to Stand rep count to no less than 8 reps for improved balance, strength, and functional mobility Baseline: 4 reps  09/21/23: 6 reps 10/20/2023: 8 reps without UE support Goal status: MET  4.  Pt will decrease TUG score to no greater than 18 seconds with least restrictive assistive device for improved safety and decreased fall risk Baseline: 41 seconds FWW 09/21/23: 19 seconds no AD 10/23/2023: 15 seconds without AD Goal status: MET  5.  Pt will improve right knee AROM to at least range of 3-110 degrees for improved comfort, mobility, and gait Baseline: see ROM chart 09/13/23:AA 10-112 09/21/23: A 6-112 10/23/2023: 5-120 Goal status: ONGOING   PLAN: PT FREQUENCY: 2x/week  PT DURATION: 6 weeks  PLANNED INTERVENTIONS: 97164- PT Re-evaluation, 97110-Therapeutic exercises, 97530- Therapeutic activity, 97112- Neuromuscular re-education, 97535- Self Care, 82993- Manual therapy, 97116- Gait training, 97014- Electrical stimulation (unattended), 97016- Vasopneumatic device, Patient/Family education, Balance training, Dry Needling, Joint mobilization, Cryotherapy, and Moist heat  PLAN FOR NEXT SESSION: assess HEP response, knee ROM, quad and hip strength, gait training , step ups   Rosana Hoes, PT, DPT, LAT, ATC 10/23/23  3:13 PM Phone: [phone] Fax: [phone]   Referring diagnosis? N27.782 (ICD-10-CM) - Status post total right knee replacement  Treatment diagnosis? (if different than referring diagnosis) M25.561 (ICD-10-CM) - Acute pain of right knee What was this (referring dx) caused by? [x]  Surgery []   Fall []  Ongoing issue []  Arthritis []  Other: ____________  Laterality: [x]  Rt []  Lt []  Both  Check all possible CPT codes:  *CHOOSE 10 OR LESS*    See Planned Interventions listed in the Plan section of the Evaluation.

## 2023-11-01 ENCOUNTER — Encounter: Payer: Self-pay | Admitting: Physical Therapy

## 2023-11-01 ENCOUNTER — Ambulatory Visit: Payer: Medicare HMO | Admitting: Physical Therapy

## 2023-11-01 DIAGNOSIS — M6281 Muscle weakness (generalized): Secondary | ICD-10-CM

## 2023-11-01 DIAGNOSIS — R2689 Other abnormalities of gait and mobility: Secondary | ICD-10-CM | POA: Diagnosis not present

## 2023-11-01 DIAGNOSIS — M25561 Pain in right knee: Secondary | ICD-10-CM | POA: Diagnosis not present

## 2023-11-01 NOTE — Therapy (Signed)
OUTPATIENT PHYSICAL THERAPY TREATMENT NOTE     Patient Name: Gloria [name] MRN: [id number] DOB:December 14, 1950, 72 y.o., female Today's Date: 11/01/2023   END OF SESSION:  PT End of Session - 11/01/23 1413     Visit Number 18    Number of Visits 25    Date for PT Re-Evaluation 12/04/23    Authorization Type [employer] MCR    Authorization Time Period 09/18/2023 - 11/04/2023; 11/06/23-12/09/23    Authorization - Visit Number 1    Authorization - Number of Visits 8    PT Start Time 0210    PT Stop Time 0259    PT Time Calculation (min) 49 min             Past Medical History:  Diagnosis Date   Alopecia areata 10/2010 onset   Arthritis    Diabetes mellitus, type 2 (HCC)    no longer per pt   Dyslipidemia    Hyperlipidemia    on meds   Hypertension    on meds   Hypothyroid    on meds   Myasthenia gravis    Past Surgical History:  Procedure Laterality Date   ABDOMINAL HYSTERECTOMY  12/12/1988   CARPAL TUNNEL RELEASE Left    CARPAL TUNNEL RELEASE Right    COLONOSCOPY  2008   [facility]   TOTAL KNEE ARTHROPLASTY Right 07/14/2023   Procedure: RIGHT TOTAL KNEE ARTHROPLASTY;  Surgeon: Kathryne Hitch, MD;  Location: [facility];  Service: Orthopedics;  Laterality: Right;   Patient Active Problem List   Diagnosis Date Noted   Abdominal cramping 10/06/2023   Overactive bladder 09/06/2023   Status post total right knee replacement 07/14/23 07/14/2023   CKD (chronic kidney disease) stage 3, GFR 30-59 ml/min (HCC) 03/08/2023   Arthropathy of lumbar facet joint 11/14/2022   Bilateral low back pain without sciatica 09/29/2021   Bradycardia 08/20/2020   Acute cervical radiculopathy 07/09/2020   Pruritus 02/06/2020   Raynaud's phenomenon 01/06/2020   Left wrist tendinitis 04/30/2018   Hand numbness 01/24/2018   Lumbar radiculopathy 12/23/2016   Plantar fasciitis of right foot 06/22/2016   Carpal tunnel syndrome of right wrist 03/07/2013   Myasthenia gravis without  exacerbation (HCC) 12/31/2010   Alopecia 12/31/2010   Hypothyroidism 12/29/2010   Type 2 diabetes, diet controlled (HCC) 12/29/2010   Hyperlipidemia 12/29/2010   Essential hypertension 12/29/2010   Coronary atherosclerosis 12/29/2010   Osteoarthritis 12/29/2010    PCP: Pincus Sanes, MD  REFERRING PROVIDER: Kathryne Hitch, MD  REFERRING DIAG:  [phone] (ICD-10-CM) - Status post total right knee replacement M17.11 (ICD-10-CM) - Unilateral primary osteoarthritis, right knee  THERAPY DIAG:  Acute pain of right knee  Muscle weakness (generalized)  Other abnormalities of gait and mobility  Rationale for Evaluation and Treatment: Rehabilitation  ONSET DATE: 07/14/2023   SUBJECTIVE:  SUBJECTIVE STATEMENT: Patient reports she is doing ok today. She continues to have right knee pain.  EVAL: Pt presents to PT s/p R TKA performed by Dr. Magnus Ivan on 07/14/2023. Had been seeing HHPT for last few weeks since after surgery. Has had difficulty with stair navigation and notes general increase in pain today. States she doesn't feel like she can do much due to pain, not sure why today is uncomfortable. Denies pain in calf, PT educated on s/s of DVT.   PAIN:  Are you having pain?  Yes: NPRS scale: 6/10 Worst: 9-10/10 Pain location: Right knee Pain description: sore, post exercise Aggravating factors: walking, standing Relieving factors: rest,  ice  PERTINENT HISTORY: HTN, Myasthenia Gravis, DM II, CKD  PRECAUTIONS: None  RED FLAGS: None   WEIGHT BEARING RESTRICTIONS: No  FALLS:  Has patient fallen in last 6 months? No  LIVING ENVIRONMENT: Lives with: lives alone Lives in: House/apartment Stairs: Yes: Internal: 14 steps; on right going up and External: 2 steps; none Has following equipment at home: Walker - 2 wheeled  OCCUPATION: Retired  PLOF: Independent  PATIENT GOALS: improve right knee to be able to get back to walking and working out   OBJECTIVE:  PATIENT  SURVEYS:  FOTO: 26% function; 54% predicted 09/21/23: 56% function  COGNITION: Overall cognitive status: Within functional limits for tasks assessed     SENSATION: Light touch: Impaired - lateral R LE  EDEMA:  DNT  POSTURE: rounded shoulders and forward head  PALPATION: TTP to distal R quad, patellar hypomobility  LOWER EXTREMITY ROM:  Active ROM Right eval Rt 08/17/23 Rt 08/22/23 RT 09/01/23 RT 09/13/23 RT 09/21/23 RT 09/28/23 RT  10/04/23 RT 10/13/23 Rt 10/18/23 Rt 10/23/23 RT 11/01/23  Knee flexion 72 sitting AA90d supine 92 AA 96 A/105 AA AA112 A 112 A 112 A 120 A 120 120 120 123  Knee extension 19 13d 10 d  10d A 6  A 4  A 3 After manual  5    (Blank rows = not tested)  LOWER EXTREMITY MMT:  MMT Right eval Left eval Rt / Lt 10/23/2023  Knee flexion DNT 4/5 4 / 5  Knee extension DNT 4/5 4 / 5   (Blank rows = not tested)  LOWER EXTREMITY SPECIAL TESTS:  DNT  FUNCTIONAL TESTS:  TUG: 41 seconds with FWW  09/21/2023: 19 seconds no AD  10/23/2023: 15 seconds without AD 30 Second Sit to Stand: 4 reps with B UE  10/20/2023: 8 reps without UE support  GAIT: Distance walked: 110ft Assistive device utilized: Environmental consultant - 2 wheeled Level of assistance: Modified independence Comments: antalgic gait R, decreased knee ext R   TREATMENT: [facility] Adult PT Treatment:                                                DATE: 11/01/23 Therapeutic Exercise: Rec Bike L1 x 5 minutes  Knee ext 15# 10 x 3  Knee flexion 20# 10 x 3  Leg press Omega 50# 10 x 3  Step up 6 inch runners , 10 x 2 right Supine QS Bridge S/L hip abduction 10 x 2 right  Manual Therapy: Hooklying knee A/P mobs Supine patellar mobs all directions Supine knee PROM flexion and extension Contract relax hamstrings   Modalities: Ice pack 10 minutes    [facility] Adult PT Treatment:                                                DATE: 10/23/23 Therapeutic Exercise: Recumbent bike L1 x 5 min while taking  subjective Knee extension machine 15# 3 x 10 Knee flexion machine 20# 2 x 10 Leg press (BATCA) 45# 3 x 10 Forward 6" step up 2 x 10 each Standing hip abduction with red at knees 2 x 10 each SLR 3 x 10 Bridge 2 x 10 Modalities: Ice pack x 10 minutes post session  [facility] Adult PT Treatment:                                                DATE: 10/20/23 Therapeutic Exercise: Recumbent bike L2 x 3 min while taking subjective Knee extension machine 15# 3 x 10 Knee flexion machine 20# 2 x 10 Leg press (BATCA) 45# 3 x 10 Forward 6" step up 2 x 10 each Sidelying hip abduction 3 x 10 with right Bridge 2 x 10 Sit to stand 2 x 10 Standing TKE with green 2 x 10 Modalities: Ice pack x 10 minutes post session  [facility] Adult PT Treatment:                                                DATE: 10/18/23 Therapeutic Exercise: Recumbent bike L2 x 3 min while taking subjective SLR 2 x 10 with right Bridge 3 x 6 Hooklying clamshell with red 2 x 10 Sidelying hip abduction 2 x 10 with right LAQ with 3# 3 x 10 on right Leg press (BATCA) 40# 3 x 10 Modalities: Ice pack x 10 minutes post session    PATIENT EDUCATION:  Education details: POC extension, HEP Person educated: Patient Education method: Programmer, multimedia, Demonstration Education comprehension: verbalized understanding and returned demonstration  HOME EXERCISE PROGRAM: Access Code: [email or web address]   ASSESSMENT: CLINICAL IMPRESSION: Pt reports continued pain especially in the morning. Her AROM has improved for knee flexion. Continues to lack minimal extension. Continued with knee strengthening and manual therapy to reduce pain. Ice pack applied at end of session per pt request.     OBJECTIVE IMPAIRMENTS: Abnormal gait, decreased activity tolerance, decreased balance, decreased mobility, difficulty walking, decreased ROM, decreased strength, and pain.   ACTIVITY LIMITATIONS: sitting, standing, squatting, sleeping, stairs, transfers, bed mobility,  and locomotion level  PARTICIPATION LIMITATIONS: driving, shopping, community activity, occupation, and yard work  PERSONAL FACTORS: Fitness and 3+ comorbidities: HTN, Myasthenia Gravis, DM II, CKD  are also affecting patient's functional outcome.    GOALS: Goals reviewed with patient? No  SHORT TERM GOALS: Target date: = LTG   Pt will be compliant and knowledgeable with initial HEP for improved comfort and carryover Baseline: initial HEP given  Goal status: MET  2.  Pt will self report right knee pain no greater than 6/10 for improved comfort and functional ability Baseline: 10/10 at worst 09/01/23: can still reach 10/10 at night, better during day (5/10)  09/21/23 - 7/10 at night 10/23/2023: continues to report pain at worse 10/10 that wakes her up 11/01/23: wakes at night, not as painful, still 9-10/10 Goal status: ONGOING  LONG TERM GOALS: Target date: 12/04/2023   Pt will improve FOTO function score to no less than 54% as proxy for functional improvement Baseline: 26% function 09/21/23: 56% function Goal status: MET   2.  Pt will self report right knee pain no greater than 3/10 for improved comfort and functional ability Baseline: 10/10 at worst 10/23/2023: 10/10 pain at worst 11/01/23: 9-10/10 in mornings Goal status: ONGOING  3.  Pt will increase 30 Second Sit to Stand rep count to no less than 8 reps for improved balance, strength, and functional mobility Baseline: 4 reps  09/21/23: 6 reps 10/20/2023: 8 reps without  UE support Goal status: MET  4.  Pt will decrease TUG score to no greater than 18 seconds with least restrictive assistive device for improved safety and decreased fall risk Baseline: 41 seconds FWW 09/21/23: 19 seconds no AD 10/23/2023: 15 seconds without AD Goal status: MET  5.  Pt will improve right knee AROM to at least range of 3-110 degrees for improved comfort, mobility, and gait Baseline: see ROM chart 09/13/23:AA 10-112 09/21/23: A  6-112 10/23/2023: 5-120 Goal status: ONGOING   PLAN: PT FREQUENCY: 2x/week  PT DURATION: 6 weeks  PLANNED INTERVENTIONS: 97164- PT Re-evaluation, 97110-Therapeutic exercises, 97530- Therapeutic activity, 97112- Neuromuscular re-education, 97535- Self Care, 29562- Manual therapy, 97116- Gait training, 97014- Electrical stimulation (unattended), 97016- Vasopneumatic device, Patient/Family education, Balance training, Dry Needling, Joint mobilization, Cryotherapy, and Moist heat  PLAN FOR NEXT SESSION: assess HEP response, knee ROM, quad and hip strength, gait training , step ups   Rosana Hoes, PT, DPT, LAT, ATC 11/01/23  2:59 PM Phone: [phone] Fax: [phone]   Referring diagnosis? K44.010 (ICD-10-CM) - Status post total right knee replacement  Treatment diagnosis? (if different than referring diagnosis) M25.561 (ICD-10-CM) - Acute pain of right knee What was this (referring dx) caused by? [x]  Surgery []  Fall []  Ongoing issue []  Arthritis []  Other: ____________  Laterality: [x]  Rt []  Lt []  Both  Check all possible CPT codes:  *CHOOSE 10 OR LESS*    See Planned Interventions listed in the Plan section of the Evaluation.

## 2023-11-06 ENCOUNTER — Encounter: Payer: Self-pay | Admitting: Physical Therapy

## 2023-11-06 ENCOUNTER — Ambulatory Visit: Payer: Medicare HMO | Admitting: Physical Therapy

## 2023-11-06 DIAGNOSIS — R2689 Other abnormalities of gait and mobility: Secondary | ICD-10-CM | POA: Diagnosis not present

## 2023-11-06 DIAGNOSIS — M25561 Pain in right knee: Secondary | ICD-10-CM

## 2023-11-06 DIAGNOSIS — M6281 Muscle weakness (generalized): Secondary | ICD-10-CM | POA: Diagnosis not present

## 2023-11-06 NOTE — Therapy (Signed)
OUTPATIENT PHYSICAL THERAPY TREATMENT NOTE     Patient Name: Gloria [name] MRN: [id number] DOB:[birth year], 72 y.o., female Today's Date: 11/06/2023   END OF SESSION:  PT End of Session - 11/06/23 1329     Visit Number 19    Number of Visits 25    Date for PT Re-Evaluation 12/04/23    Authorization Type [employer] M[employer]    Authorization Time Period 09/18/2023 - 11/04/2023; 11/06/23-12/09/23    Authorization - Visit Number 2    Authorization - Number of Visits 8    PT Start Time 1318    PT Stop Time 1400    PT Time Calculation (min) 42 min             Past Medical History:  Diagnosis Date   Alopecia areata 10/2010 onset   Arthritis    Diabetes mellitus, type 2 (HCC)    no longer per pt   Dyslipidemia    Hyperlipidemia    on meds   Hypertension    on meds   Hypothyroid    on meds   Myasthenia gravis    Past Surgical History:  Procedure Laterality Date   ABDOMINAL HYSTERECTOMY  12/12/1988   CARPAL TUNNEL RELEASE Left    CARPAL TUNNEL RELEASE Right    COLONOSCOPY  2008   [facility]   TOTAL KNEE ARTHROPLASTY Right 07/14/2023   Procedure: RIGHT TOTAL KNEE ARTHROPLASTY;  Surgeon: Kathryne Hitch, MD;  Location: [facility];  Service: Orthopedics;  Laterality: Right;   Patient Active Problem List   Diagnosis Date Noted   Abdominal cramping 10/06/2023   Overactive bladder 09/06/2023   Status post total right knee replacement 07/14/23 07/14/2023   CKD (chronic kidney disease) stage 3, GFR 30-59 ml/min (HCC) 03/08/2023   Arthropathy of lumbar facet joint 11/14/2022   Bilateral low back pain without sciatica 09/29/2021   Bradycardia 08/20/2020   Acute cervical radiculopathy 07/09/2020   Pruritus 02/06/2020   Raynaud's phenomenon 01/06/2020   Left wrist tendinitis 04/30/2018   Hand numbness 01/24/2018   Lumbar radiculopathy 12/23/2016   Plantar fasciitis of right foot 06/22/2016   Carpal tunnel syndrome of right wrist 03/07/2013   Myasthenia gravis without  exacerbation (HCC) 12/31/2010   Alopecia 12/31/2010   Hypothyroidism 12/29/2010   Type 2 diabetes, diet controlled (HCC) 12/29/2010   Hyperlipidemia 12/29/2010   Essential hypertension 12/29/2010   Coronary atherosclerosis 12/29/2010   Osteoarthritis 12/29/2010    PCP: Pincus Sanes, MD  REFERRING PROVIDER: Kathryne Hitch, MD  REFERRING DIAG:  [phone] (ICD-10-CM) - Status post total right knee replacement M17.11 (ICD-10-CM) - Unilateral primary osteoarthritis, right knee  THERAPY DIAG:  Acute pain of right knee  Muscle weakness (generalized)  Other abnormalities of gait and mobility  Rationale for Evaluation and Treatment: Rehabilitation  ONSET DATE: 07/14/2023   SUBJECTIVE:  SUBJECTIVE STATEMENT: Patient reports she is doing ok today. She continues to have right knee pain.  EVAL: Pt presents to PT s/p R TKA performed by Dr. Magnus Ivan on 07/14/2023. Had been seeing HHPT for last few weeks since after surgery. Has had difficulty with stair navigation and notes general increase in pain today. States she doesn't feel like she can do much due to pain, not sure why today is uncomfortable. Denies pain in calf, PT educated on s/s of DVT.   PAIN:  Are you having pain?  Yes: NPRS scale: 6/10 Worst: 9-10/10 Pain location: Right knee Pain description: sore, post exercise Aggravating factors: walking, standing Relieving factors: rest,  ice  PERTINENT HISTORY: HTN, Myasthenia Gravis, DM II, CKD  PRECAUTIONS: None  RED FLAGS: None   WEIGHT BEARING RESTRICTIONS: No  FALLS:  Has patient fallen in last 6 months? No  LIVING ENVIRONMENT: Lives with: lives alone Lives in: House/apartment Stairs: Yes: Internal: 14 steps; on right going up and External: 2 steps; none Has following equipment at home: Walker - 2 wheeled  OCCUPATION: Retired  PLOF: Independent  PATIENT GOALS: improve right knee to be able to get back to walking and working out   OBJECTIVE:  PATIENT  SURVEYS:  FOTO: 26% function; 54% predicted 09/21/23: 56% function  COGNITION: Overall cognitive status: Within functional limits for tasks assessed     SENSATION: Light touch: Impaired - lateral R LE  EDEMA:  DNT  POSTURE: rounded shoulders and forward head  PALPATION: TTP to distal R quad, patellar hypomobility  LOWER EXTREMITY ROM:  Active ROM Right eval Rt 08/17/23 Rt 08/22/23 RT 09/01/23 RT 09/13/23 RT 09/21/23 RT 09/28/23 RT  10/04/23 RT 10/13/23 Rt 10/18/23 Rt 10/23/23 RT 11/01/23  Knee flexion 72 sitting AA90d supine 92 AA 96 A/105 AA AA112 A 112 A 112 A 120 A 120 120 120 123  Knee extension 19 13d 10 d  10d A 6  A 4  A 3 After manual  5    (Blank rows = not tested)  LOWER EXTREMITY MMT:  MMT Right eval Left eval Rt / Lt 10/23/2023  Knee flexion DNT 4/5 4 / 5  Knee extension DNT 4/5 4 / 5   (Blank rows = not tested)  LOWER EXTREMITY SPECIAL TESTS:  DNT  FUNCTIONAL TESTS:  TUG: 41 seconds with FWW  09/21/2023: 19 seconds no AD  10/23/2023: 15 seconds without AD 30 Second Sit to Stand: 4 reps with B UE  10/20/2023: 8 reps without UE support  GAIT: Distance walked: 66ft Assistive device utilized: Environmental consultant - 2 wheeled Level of assistance: Modified independence Comments: antalgic gait R, decreased knee ext R   TREATMENT: [facility] Adult PT Treatment:                                                DATE: 11/01/23 Therapeutic Exercise: Rec Bike L2 x 5 minutes  Knee ext 15# 10 x 3  Knee flexion 20# 10 x 3  Leg press Omega single leg  25# 10 x 1, 35# 10 x 1 STS low chair x 10 6 inch lateral step down  Supine with strap h/s stretch Supine QS 5 sec x 10  Manual Therapy: Hooklying knee A/P mobs Supine patellar mobs all directions Supine knee PROM flexion and extension Contract relax hamstrings   Modalities: Ice pack 10 minutes   [facility] Adult PT Treatment:                                                DATE: 11/01/23 Therapeutic Exercise: Rec Bike  L1 x 5 minutes  Knee ext 15# 10 x 3  Knee flexion 20# 10 x 3  Leg press Omega 50# 10 x 3  Step up 6 inch runners , 10 x 2 right Supine QS Bridge S/L hip abduction 10 x 2 right  Manual Therapy: Hooklying knee A/P mobs Supine patellar  mobs all directions Supine knee PROM flexion and extension Contract relax hamstrings   Modalities: Ice pack 10 minutes    [facility] Adult PT Treatment:                                                DATE: 10/23/23 Therapeutic Exercise: Recumbent bike L1 x 5 min while taking subjective Knee extension machine 15# 3 x 10 Knee flexion machine 20# 2 x 10 Leg press (BATCA) 45# 3 x 10 Forward 6" step up 2 x 10 each Standing hip abduction with red at knees 2 x 10 each SLR 3 x 10 Bridge 2 x 10 Modalities: Ice pack x 10 minutes post session   [facility] Adult PT Treatment:                                                DATE: 10/20/23 Therapeutic Exercise: Recumbent bike L2 x 3 min while taking subjective Knee extension machine 15# 3 x 10 Knee flexion machine 20# 2 x 10 Leg press (BATCA) 45# 3 x 10 Forward 6" step up 2 x 10 each Sidelying hip abduction 3 x 10 with right Bridge 2 x 10 Sit to stand 2 x 10 Standing TKE with green 2 x 10 Modalities: Ice pack x 10 minutes post session  [facility] Adult PT Treatment:                                                DATE: 10/18/23 Therapeutic Exercise: Recumbent bike L2 x 3 min while taking subjective SLR 2 x 10 with right Bridge 3 x 6 Hooklying clamshell with red 2 x 10 Sidelying hip abduction 2 x 10 with right LAQ with 3# 3 x 10 on right Leg press (BATCA) 40# 3 x 10 Modalities: Ice pack x 10 minutes post session    PATIENT EDUCATION:  Education details: POC extension, HEP Person educated: Patient Education method: Programmer, multimedia, Demonstration Education comprehension: verbalized understanding and returned demonstration  HOME EXERCISE PROGRAM: Access Code: [email or web address]   ASSESSMENT: CLINICAL IMPRESSION: Pt  reports continued pain especially in the morning. Continues to lack minimal extension. Added closed chain calf stretch which she reports feeling a significant pull in posterior  leg. Continued with knee strengthening and manual therapy to reduce pain. Ice pack applied at end of session per pt request.     OBJECTIVE IMPAIRMENTS: Abnormal gait, decreased activity tolerance, decreased balance, decreased mobility, difficulty walking, decreased ROM, decreased strength, and pain.   ACTIVITY LIMITATIONS: sitting, standing, squatting, sleeping, stairs, transfers, bed mobility, and locomotion level  PARTICIPATION LIMITATIONS: driving, shopping, community activity, occupation, and yard work  PERSONAL FACTORS: Fitness and 3+ comorbidities: HTN, Myasthenia Gravis, DM II, CKD  are also affecting patient's functional outcome.    GOALS: Goals reviewed with patient? No  SHORT TERM GOALS: Target date: = LTG   Pt will be compliant and knowledgeable with initial HEP for improved comfort and carryover Baseline: initial HEP given  Goal status: MET  2.  Pt will self report right knee pain no greater than 6/10 for improved comfort and functional ability  Baseline: 10/10 at worst 09/01/23: can still reach 10/10 at night, better during day (5/10)  09/21/23 - 7/10 at night 10/23/2023: continues to report pain at worse 10/10 that wakes her up 11/01/23: wakes at night, not as painful, still 9-10/10 Goal status: ONGOING  LONG TERM GOALS: Target date: 12/04/2023   Pt will improve FOTO function score to no less than 54% as proxy for functional improvement Baseline: 26% function 09/21/23: 56% function Goal status: MET   2.  Pt will self report right knee pain no greater than 3/10 for improved comfort and functional ability Baseline: 10/10 at worst 10/23/2023: 10/10 pain at worst 11/01/23: 9-10/10 in mornings Goal status: ONGOING  3.  Pt will increase 30 Second Sit to Stand rep count to no less than 8 reps  for improved balance, strength, and functional mobility Baseline: 4 reps  09/21/23: 6 reps 10/20/2023: 8 reps without UE support Goal status: MET  4.  Pt will decrease TUG score to no greater than 18 seconds with least restrictive assistive device for improved safety and decreased fall risk Baseline: 41 seconds FWW 09/21/23: 19 seconds no AD 10/23/2023: 15 seconds without AD Goal status: MET  5.  Pt will improve right knee AROM to at least range of 3-110 degrees for improved comfort, mobility, and gait Baseline: see ROM chart 09/13/23:AA 10-112 09/21/23: A 6-112 10/23/2023: 5-120 Goal status: ONGOING   PLAN: PT FREQUENCY: 2x/week  PT DURATION: 6 weeks  PLANNED INTERVENTIONS: 97164- PT Re-evaluation, 97110-Therapeutic exercises, 97530- Therapeutic activity, 97112- Neuromuscular re-education, 97535- Self Care, 81191- Manual therapy, 97116- Gait training, 97014- Electrical stimulation (unattended), 97016- Vasopneumatic device, Patient/Family education, Balance training, Dry Needling, Joint mobilization, Cryotherapy, and Moist heat  PLAN FOR NEXT SESSION: assess HEP response, knee ROM, quad and hip strength, gait training , step ups   Rosana Hoes, PT, DPT, LAT, ATC 11/06/23  3:47 PM Phone: [phone] Fax: [phone]   Referring diagnosis? E95.284 (ICD-10-CM) - Status post total right knee replacement  Treatment diagnosis? (if different than referring diagnosis) M25.561 (ICD-10-CM) - Acute pain of right knee What was this (referring dx) caused by? [x]  Surgery []  Fall []  Ongoing issue []  Arthritis []  Other: ____________  Laterality: [x]  Rt []  Lt []  Both  Check all possible CPT codes:  *CHOOSE 10 OR LESS*    See Planned Interventions listed in the Plan section of the Evaluation.

## 2023-11-08 ENCOUNTER — Encounter: Payer: Self-pay | Admitting: Physical Therapy

## 2023-11-08 ENCOUNTER — Ambulatory Visit: Payer: Medicare HMO | Admitting: Physical Therapy

## 2023-11-08 ENCOUNTER — Other Ambulatory Visit: Payer: Self-pay | Admitting: Internal Medicine

## 2023-11-08 DIAGNOSIS — M6281 Muscle weakness (generalized): Secondary | ICD-10-CM | POA: Diagnosis not present

## 2023-11-08 DIAGNOSIS — R2689 Other abnormalities of gait and mobility: Secondary | ICD-10-CM | POA: Diagnosis not present

## 2023-11-08 DIAGNOSIS — M25561 Pain in right knee: Secondary | ICD-10-CM

## 2023-11-08 NOTE — Therapy (Signed)
OUTPATIENT PHYSICAL THERAPY TREATMENT NOTE + PROGRESS NOTE      Patient Name: Gloria [name] MRN: [id number] DOB:Apr 11, 1951, 72 y.o., female Today's Date: 11/08/2023   Progress Note Reporting Period 09/21/23 to 11/08/23  See note below for Objective Data and Assessment of Progress/Goals.       END OF SESSION:  PT End of Session - 11/08/23 1319     Visit Number 20    Number of Visits 25    Date for PT Re-Evaluation 12/04/23    Authorization Type [employer] M[employer]    Authorization Time Period 09/18/2023 - 11/04/2023; 11/06/23-12/09/23    Authorization - Visit Number 3    Authorization - Number of Visits 8    PT Start Time 1321   late check in   PT Stop Time 1410   last ten min ice, unbilled   PT Time Calculation (min) 49 min    Activity Tolerance Patient tolerated treatment well    Behavior During Therapy WFL for tasks assessed/performed              Past Medical History:  Diagnosis Date   Alopecia areata 10/2010 onset   Arthritis    Diabetes mellitus, type 2 (HCC)    no longer per pt   Dyslipidemia    Hyperlipidemia    on meds   Hypertension    on meds   Hypothyroid    on meds   Myasthenia gravis    Past Surgical History:  Procedure Laterality Date   ABDOMINAL HYSTERECTOMY  12/12/1988   CARPAL TUNNEL RELEASE Left    CARPAL TUNNEL RELEASE Right    COLONOSCOPY  2008   [facility]   TOTAL KNEE ARTHROPLASTY Right 07/14/2023   Procedure: RIGHT TOTAL KNEE ARTHROPLASTY;  Surgeon: Kathryne Hitch, MD;  Location: [facility];  Service: Orthopedics;  Laterality: Right;   Patient Active Problem List   Diagnosis Date Noted   Abdominal cramping 10/06/2023   Overactive bladder 09/06/2023   Status post total right knee replacement 07/14/23 07/14/2023   CKD (chronic kidney disease) stage 3, GFR 30-59 ml/min (HCC) 03/08/2023   Arthropathy of lumbar facet joint 11/14/2022   Bilateral low back pain without sciatica 09/29/2021   Bradycardia 08/20/2020   Acute cervical  radiculopathy 07/09/2020   Pruritus 02/06/2020   Raynaud's phenomenon 01/06/2020   Left wrist tendinitis 04/30/2018   Hand numbness 01/24/2018   Lumbar radiculopathy 12/23/2016   Plantar fasciitis of right foot 06/22/2016   Carpal tunnel syndrome of right wrist 03/07/2013   Myasthenia gravis without exacerbation (HCC) 12/31/2010   Alopecia 12/31/2010   Hypothyroidism 12/29/2010   Type 2 diabetes, diet controlled (HCC) 12/29/2010   Hyperlipidemia 12/29/2010   Essential hypertension 12/29/2010   Coronary atherosclerosis 12/29/2010   Osteoarthritis 12/29/2010    PCP: Pincus Sanes, MD  REFERRING PROVIDER: Kathryne Hitch, MD  REFERRING DIAG:  [phone] (ICD-10-CM) - Status post total right knee replacement M17.11 (ICD-10-CM) - Unilateral primary osteoarthritis, right knee  THERAPY DIAG:  Acute pain of right knee  Muscle weakness (generalized)  Other abnormalities of gait and mobility  Rationale for Evaluation and Treatment: Rehabilitation  ONSET DATE: 07/14/2023   SUBJECTIVE:  SUBJECTIVE STATEMENT: Pt arrives w/ a bit more knee pain today since this morning, was feeling good after last session. 4-5/10 at present.    EVAL: Pt presents to PT s/p R TKA performed by Dr. Magnus Ivan on 07/14/2023. Had been seeing HHPT for last few weeks since after surgery. Has had difficulty with stair  navigation and notes general increase in pain today. States she doesn't feel like she can do much due to pain, not sure why today is uncomfortable. Denies pain in calf, PT educated on s/s of DVT.   PAIN:  Are you having pain?  Yes: NPRS scale: 6/10 Worst: 9-10/10 Pain location: Right knee Pain description: sore, post exercise Aggravating factors: walking, standing Relieving factors: rest, ice  PERTINENT HISTORY: HTN, Myasthenia Gravis, DM II, CKD  PRECAUTIONS: None  RED FLAGS: None   WEIGHT BEARING RESTRICTIONS: No  FALLS:  Has patient fallen in last 6 months? No  LIVING  ENVIRONMENT: Lives with: lives alone Lives in: House/apartment Stairs: Yes: Internal: 14 steps; on right going up and External: 2 steps; none Has following equipment at home: Walker - 2 wheeled  OCCUPATION: Retired  PLOF: Independent  PATIENT GOALS: improve right knee to be able to get back to walking and working out   OBJECTIVE:  PATIENT SURVEYS:  FOTO: 26% function; 54% predicted 09/21/23: 56% function 11/08/23: 59%  COGNITION: Overall cognitive status: Within functional limits for tasks assessed     SENSATION: Light touch: Impaired - lateral R LE  EDEMA:  DNT  POSTURE: rounded shoulders and forward head  PALPATION: TTP to distal R quad, patellar hypomobility  LOWER EXTREMITY ROM:  Active ROM Right eval Rt 08/17/23 Rt 08/22/23 RT 09/01/23 RT 09/13/23 RT 09/21/23 RT 09/28/23 RT  10/04/23 RT 10/13/23 Rt 10/18/23 Rt 10/23/23 RT 11/01/23 Right 11/08/23  Knee flexion 72 sitting AA90d supine 92 AA 96 A/105 AA AA112 A 112 A 112 A 120 A 120 120 120 123 121 deg  Knee extension 19 13d 10 d  10d A 6  A 4  A 3 After manual  5  Lacking 3    (Blank rows = not tested)  LOWER EXTREMITY MMT:  MMT Right eval Left eval Rt / Lt 10/23/2023 Right/Left 11/08/23  Knee flexion DNT 4/5 4 / 5 4+ / 5   Knee extension DNT 4/5 4 / 5 4 / 5    (Blank rows = not tested)  LOWER EXTREMITY SPECIAL TESTS:  DNT  FUNCTIONAL TESTS:  TUG: 41 seconds with FWW  09/21/2023: 19 seconds no AD  10/23/2023: 15 seconds without AD  11/08/23: 12.08sec no AD 30 Second Sit to Stand: 4 reps with B UE  10/20/2023: 8 reps without UE support 11/08/23: 9.5 reps gentle UE support from thighs, fatigue   GAIT: Distance walked: 68ft Assistive device utilized: Environmental consultant - 2 wheeled Level of assistance: Modified independence Comments: antalgic gait R, decreased knee ext R   TREATMENT: [facility] Adult PT Treatment:                                                DATE: 11/08/23 Therapeutic Exercise: Omega  knee extension BIL 15# 2x12 Omega knee flexion BIL 20# 2x12 TKE into swiss ball 2x10 emphasis on quad contraction SL hip abduction x12 SL hip circle x5 CW/CCW   Therapeutic Activity: 30sec STS + education TUG + education MSK assessment + education FOTO + education Education/discussion re: progress with PT, symptom behavior as it affects activity tolerance, PT goals/POC   Modalities: Cold pack R knee w/ LE support, 10 min no adverse events and good relief    [facility] Adult PT Treatment:  DATE: 11/01/23 Therapeutic Exercise: Rec Bike L2 x 5 minutes  Knee ext 15# 10 x 3  Knee flexion 20# 10 x 3  Leg press Omega single leg  25# 10 x 1, 35# 10 x 1 STS low chair x 10 6 inch lateral step down  Supine with strap h/s stretch Supine QS 5 sec x 10  Manual Therapy: Hooklying knee A/P mobs Supine patellar mobs all directions Supine knee PROM flexion and extension Contract relax hamstrings   Modalities: Ice pack 10 minutes   [facility] Adult PT Treatment:                                                DATE: 11/01/23 Therapeutic Exercise: Rec Bike L1 x 5 minutes  Knee ext 15# 10 x 3  Knee flexion 20# 10 x 3  Leg press Omega 50# 10 x 3  Step up 6 inch runners , 10 x 2 right Supine QS Bridge S/L hip abduction 10 x 2 right  Manual Therapy: Hooklying knee A/P mobs Supine patellar mobs all directions Supine knee PROM flexion and extension Contract relax hamstrings   Modalities: Ice pack 10 minutes    [facility] Adult PT Treatment:                                                DATE: 10/23/23 Therapeutic Exercise: Recumbent bike L1 x 5 min while taking subjective Knee extension machine 15# 3 x 10 Knee flexion machine 20# 2 x 10 Leg press (BATCA) 45# 3 x 10 Forward 6" step up 2 x 10 each Standing hip abduction with red at knees 2 x 10 each SLR 3 x 10 Bridge 2 x 10 Modalities: Ice pack x 10 minutes post session   [facility] Adult PT  Treatment:                                                DATE: 10/20/23 Therapeutic Exercise: Recumbent bike L2 x 3 min while taking subjective Knee extension machine 15# 3 x 10 Knee flexion machine 20# 2 x 10 Leg press (BATCA) 45# 3 x 10 Forward 6" step up 2 x 10 each Sidelying hip abduction 3 x 10 with right Bridge 2 x 10 Sit to stand 2 x 10 Standing TKE with green 2 x 10 Modalities: Ice pack x 10 minutes post session  [facility] Adult PT Treatment:                                                DATE: 10/18/23 Therapeutic Exercise: Recumbent bike L2 x 3 min while taking subjective SLR 2 x 10 with right Bridge 3 x 6 Hooklying clamshell with red 2 x 10 Sidelying hip abduction 2 x 10 with right LAQ with 3# 3 x 10 on right Leg press (BATCA) 40# 3 x 10 Modalities: Ice pack x 10 minutes post session    PATIENT EDUCATION:  Education details: rationale for interventions, HEP  Person educated: Patient Education method: Explanation, Demonstration Education comprehension: verbalized understanding and returned demonstration  HOME EXERCISE PROGRAM: Access Code: [email or web address]   ASSESSMENT: CLINICAL IMPRESSION: Pt arrives w/ increased pain since this morning but states overall she has been feeling good the past few days. With today's progress note pt demos knee flexion/extension ROM comparable to prior sessions, modest improvement in knee flexion strength, and improvement in 30secSTS and TUG time as above. Continues w/ mild quad weakness and pain at end range. At this point recommend continuing along current POC to address relevant deficits with aim of minimizing pain. Pt tolerates remainder of session well with no increase in pain, emphasis on quad strength and functional mechanics. Pt requests ice at end of session which she reports good relief with. No adverse events. Pt departs today's session in no acute distress, all voiced questions/concerns addressed appropriately from PT perspective.        OBJECTIVE IMPAIRMENTS: Abnormal gait, decreased activity tolerance, decreased balance, decreased mobility, difficulty walking, decreased ROM, decreased strength, and pain.   ACTIVITY LIMITATIONS: sitting, standing, squatting, sleeping, stairs, transfers, bed mobility, and locomotion level  PARTICIPATION LIMITATIONS: driving, shopping, community activity, occupation, and yard work  PERSONAL FACTORS: Fitness and 3+ comorbidities: HTN, Myasthenia Gravis, DM II, CKD  are also affecting patient's functional outcome.    GOALS: Goals reviewed with patient? No  SHORT TERM GOALS: Target date: = LTG   Pt will be compliant and knowledgeable with initial HEP for improved comfort and carryover Baseline: initial HEP given  Goal status: MET  2.  Pt will self report right knee pain no greater than 6/10 for improved comfort and functional ability Baseline: 10/10 at worst 09/01/23: can still reach 10/10 at night, better during day (5/10)  09/21/23 - 7/10 at night 10/23/2023: continues to report pain at worse 10/10 that wakes her up 11/01/23: wakes at night, not as painful, still 9-10/10 11/08/23: endorses up to 8/10 pain at times Goal status: ONGOING  LONG TERM GOALS: Target date: 12/04/2023   Pt will improve FOTO function score to no less than 54% as proxy for functional improvement Baseline: 26% function 09/21/23: 56% function 11/08/23: 59% Goal status: MET   2.  Pt will self report right knee pain no greater than 3/10 for improved comfort and functional ability Baseline: 10/10 at worst 10/23/2023: 10/10 pain at worst 11/01/23: 9-10/10 in mornings 11/08/23: up to 8/10 Goal status: ONGOING  3.  Pt will increase 30 Second Sit to Stand rep count to no less than 8 reps for improved balance, strength, and functional mobility Baseline: 4 reps  09/21/23: 6 reps 10/20/2023: 8 reps without UE support 11/08/23: 9.5reps  Goal status: MET  4.  Pt will decrease TUG score to no greater than  18 seconds with least restrictive assistive device for improved safety and decreased fall risk Baseline: 41 seconds FWW 09/21/23: 19 seconds no AD 10/23/2023: 15 seconds without AD Goal status: MET  5.  Pt will improve right knee AROM to at least range of 3-110 degrees for improved comfort, mobility, and gait Baseline: see ROM chart 09/13/23:AA 10-112 09/21/23: A 6-112 10/23/2023: 5-120 11/08/23: see ROM chart above Goal status: ONGOING   PLAN: PT FREQUENCY: 2x/week  PT DURATION: 6 weeks  PLANNED INTERVENTIONS: 97164- PT Re-evaluation, 97110-Therapeutic exercises, 97530- Therapeutic activity, 97112- Neuromuscular re-education, 97535- Self Care, 21308- Manual therapy, [id number]- Gait training, 97014- Electrical stimulation (unattended), 97016- Vasopneumatic device, Patient/Family education, Balance training, Dry Needling, Joint mobilization, Cryotherapy, and Moist heat  PLAN FOR  NEXT SESSION: continue with knee strength/control, emphasis on quad   Ashley Murrain PT, DPT 11/08/2023 5:25 PM    Referring diagnosis? L87.564 (ICD-10-CM) - Status post total right knee replacement  Treatment diagnosis? (if different than referring diagnosis) M25.561 (ICD-10-CM) - Acute pain of right knee What was this (referring dx) caused by? [x]  Surgery []  Fall []  Ongoing issue []  Arthritis []  Other: ____________  Laterality: [x]  Rt []  Lt []  Both  Check all possible CPT codes:  *CHOOSE 10 OR LESS*    See Planned Interventions listed in the Plan section of the Evaluation.

## 2023-11-09 ENCOUNTER — Other Ambulatory Visit: Payer: Self-pay | Admitting: Orthopaedic Surgery

## 2023-11-13 ENCOUNTER — Encounter: Payer: Self-pay | Admitting: Physical Therapy

## 2023-11-13 ENCOUNTER — Ambulatory Visit: Payer: Medicare HMO | Attending: Orthopaedic Surgery | Admitting: Physical Therapy

## 2023-11-13 DIAGNOSIS — M6281 Muscle weakness (generalized): Secondary | ICD-10-CM | POA: Insufficient documentation

## 2023-11-13 DIAGNOSIS — R2689 Other abnormalities of gait and mobility: Secondary | ICD-10-CM | POA: Diagnosis not present

## 2023-11-13 DIAGNOSIS — M25561 Pain in right knee: Secondary | ICD-10-CM | POA: Insufficient documentation

## 2023-11-13 NOTE — Therapy (Signed)
OUTPATIENT PHYSICAL THERAPY TREATMENT NOTE     Patient Name: Gloria [name] MRN: [id number] DOB:[birth year], 72 y.o., female Today's Date: 11/13/2023        END OF SESSION:  PT End of Session - 11/13/23 1334     Visit Number 21    Number of Visits 25    Date for PT Re-Evaluation 12/04/23    Authorization Type [employer] M[employer]    Authorization Time Period 09/18/2023 - 11/04/2023; 11/06/23-12/09/23    Authorization - Visit Number 4    Authorization - Number of Visits 8    PT Start Time 1323    PT Stop Time 1411    PT Time Calculation (min) 48 min               Past Medical History:  Diagnosis Date   Alopecia areata 10/2010 onset   Arthritis    Diabetes mellitus, type 2 (HCC)    no longer per pt   Dyslipidemia    Hyperlipidemia    on meds   Hypertension    on meds   Hypothyroid    on meds   Myasthenia gravis    Past Surgical History:  Procedure Laterality Date   ABDOMINAL HYSTERECTOMY  12/12/1988   CARPAL TUNNEL RELEASE Left    CARPAL TUNNEL RELEASE Right    COLONOSCOPY  2008   [facility]   TOTAL KNEE ARTHROPLASTY Right 07/14/2023   Procedure: RIGHT TOTAL KNEE ARTHROPLASTY;  Surgeon: Kathryne Hitch, MD;  Location: [facility];  Service: Orthopedics;  Laterality: Right;   Patient Active Problem List   Diagnosis Date Noted   Abdominal cramping 10/06/2023   Overactive bladder 09/06/2023   Status post total right knee replacement 07/14/23 07/14/2023   CKD (chronic kidney disease) stage 3, GFR 30-59 ml/min (HCC) 03/08/2023   Arthropathy of lumbar facet joint 11/14/2022   Bilateral low back pain without sciatica 09/29/2021   Bradycardia 08/20/2020   Acute cervical radiculopathy 07/09/2020   Pruritus 02/06/2020   Raynaud's phenomenon 01/06/2020   Left wrist tendinitis 04/30/2018   Hand numbness 01/24/2018   Lumbar radiculopathy 12/23/2016   Plantar fasciitis of right foot 06/22/2016   Carpal tunnel syndrome of right wrist 03/07/2013   Myasthenia gravis  without exacerbation (HCC) 12/31/2010   Alopecia 12/31/2010   Hypothyroidism 12/29/2010   Type 2 diabetes, diet controlled (HCC) 12/29/2010   Hyperlipidemia 12/29/2010   Essential hypertension 12/29/2010   Coronary atherosclerosis 12/29/2010   Osteoarthritis 12/29/2010    PCP: Pincus Sanes, MD  REFERRING PROVIDER: Kathryne Hitch, MD  REFERRING DIAG:  [phone] (ICD-10-CM) - Status post total right knee replacement M17.11 (ICD-10-CM) - Unilateral primary osteoarthritis, right knee  THERAPY DIAG:  Acute pain of right knee  Muscle weakness (generalized)  Rationale for Evaluation and Treatment: Rehabilitation  ONSET DATE: 07/14/2023   SUBJECTIVE:  SUBJECTIVE STATEMENT: Pt reports knee is hurting today 6/10. Has discontinued her pain meds which may be contributing.    EVAL: Pt presents to PT s/p R TKA performed by Dr. Magnus Ivan on 07/14/2023. Had been seeing HHPT for last few weeks since after surgery. Has had difficulty with stair navigation and notes general increase in pain today. States she doesn't feel like she can do much due to pain, not sure why today is uncomfortable. Denies pain in calf, PT educated on s/s of DVT.   PAIN:  Are you having pain?  Yes: NPRS scale: 6/10 Worst: 9-10/10 Pain location: Right knee Pain description: sore, post exercise Aggravating factors: walking,  standing Relieving factors: rest, ice  PERTINENT HISTORY: HTN, Myasthenia Gravis, DM II, CKD  PRECAUTIONS: None  RED FLAGS: None   WEIGHT BEARING RESTRICTIONS: No  FALLS:  Has patient fallen in last 6 months? No  LIVING ENVIRONMENT: Lives with: lives alone Lives in: House/apartment Stairs: Yes: Internal: 14 steps; on right going up and External: 2 steps; none Has following equipment at home: Walker - 2 wheeled  OCCUPATION: Retired  PLOF: Independent  PATIENT GOALS: improve right knee to be able to get back to walking and working out   OBJECTIVE:  PATIENT SURVEYS:   FOTO: 26% function; 54% predicted 09/21/23: 56% function 11/08/23: 59%  COGNITION: Overall cognitive status: Within functional limits for tasks assessed     SENSATION: Light touch: Impaired - lateral R LE  EDEMA:  DNT  POSTURE: rounded shoulders and forward head  PALPATION: TTP to distal R quad, patellar hypomobility  LOWER EXTREMITY ROM:  Active ROM Right eval Rt 08/17/23 Rt 08/22/23 RT 09/01/23 RT 09/13/23 RT 09/21/23 RT 09/28/23 RT  10/04/23 RT 10/13/23 Rt 10/18/23 Rt 10/23/23 RT 11/01/23 Right 11/08/23  Knee flexion 72 sitting AA90d supine 92 AA 96 A/105 AA AA112 A 112 A 112 A 120 A 120 120 120 123 121 deg  Knee extension 19 13d 10 d  10d A 6  A 4  A 3 After manual  5  Lacking 3    (Blank rows = not tested)  LOWER EXTREMITY MMT:  MMT Right eval Left eval Rt / Lt 10/23/2023 Right/Left 11/08/23  Knee flexion DNT 4/5 4 / 5 4+ / 5   Knee extension DNT 4/5 4 / 5 4 / 5    (Blank rows = not tested)  LOWER EXTREMITY SPECIAL TESTS:  DNT  FUNCTIONAL TESTS:  TUG: 41 seconds with FWW  09/21/2023: 19 seconds no AD  10/23/2023: 15 seconds without AD  11/08/23: 12.08sec no AD 30 Second Sit to Stand: 4 reps with B UE  10/20/2023: 8 reps without UE support 11/08/23: 9.5 reps gentle UE support from thighs, fatigue   GAIT: Distance walked: 74ft Assistive device utilized: Environmental consultant - 2 wheeled Level of assistance: Modified independence Comments: antalgic gait R, decreased knee ext R   TREATMENT: [facility] Adult PT Treatment:                                                DATE: 11/13/23 Therapeutic Exercise: Rec Bike x 5 min STS bariatric chair x 10 Right SLS on foam with left hip abdct, ext  Right gastroc stretch  Right 6 inch step down 10 x 2  Leg press Omega single leg   35# 10 x 2 Knee ext 15# 15 x 2 Knee flex 25# 12 x 2   Modalities: ice Pack x 10 min right knee     [facility] Adult PT Treatment:                                                DATE:  11/08/23 Therapeutic Exercise: Omega knee extension BIL 15# 2x12 Omega knee flexion BIL 20# 2x12 TKE into swiss ball 2x10 emphasis on quad contraction SL hip abduction x12 SL hip circle x5 CW/CCW   Therapeutic Activity: 30sec STS +  education TUG + education MSK assessment + education FOTO + education Education/discussion re: progress with PT, symptom behavior as it affects activity tolerance, PT goals/POC   Modalities: Cold pack R knee w/ LE support, 10 min no adverse events and good relief    [facility] Adult PT Treatment:                                                DATE: 11/01/23 Therapeutic Exercise: Rec Bike L2 x 5 minutes  Knee ext 15# 10 x 3  Knee flexion 20# 10 x 3  Leg press Omega single leg  25# 10 x 1, 35# 10 x 1 STS low chair x 10 6 inch lateral step down  Supine with strap h/s stretch Supine QS 5 sec x 10  Manual Therapy: Hooklying knee A/P mobs Supine patellar mobs all directions Supine knee PROM flexion and extension Contract relax hamstrings   Modalities: Ice pack 10 minutes   [facility] Adult PT Treatment:                                                DATE: 11/01/23 Therapeutic Exercise: Rec Bike L1 x 5 minutes  Knee ext 15# 10 x 3  Knee flexion 20# 10 x 3  Leg press Omega 50# 10 x 3  Step up 6 inch runners , 10 x 2 right Supine QS Bridge S/L hip abduction 10 x 2 right  Manual Therapy: Hooklying knee A/P mobs Supine patellar mobs all directions Supine knee PROM flexion and extension Contract relax hamstrings   Modalities: Ice pack 10 minutes    [facility] Adult PT Treatment:                                                DATE: 10/23/23 Therapeutic Exercise: Recumbent bike L1 x 5 min while taking subjective Knee extension machine 15# 3 x 10 Knee flexion machine 20# 2 x 10 Leg press (BATCA) 45# 3 x 10 Forward 6" step up 2 x 10 each Standing hip abduction with red at knees 2 x 10 each SLR 3 x 10 Bridge 2 x 10 Modalities: Ice pack x 10 minutes  post session   [facility] Adult PT Treatment:                                                DATE: 10/20/23 Therapeutic Exercise: Recumbent bike L2 x 3 min while taking subjective Knee extension machine 15# 3 x 10 Knee flexion machine 20# 2 x 10 Leg press (BATCA) 45# 3 x 10 Forward 6" step up 2 x 10 each Sidelying hip abduction 3 x 10 with right Bridge 2 x 10 Sit to stand 2 x 10 Standing TKE with green 2 x 10 Modalities: Ice pack x 10 minutes post session      PATIENT EDUCATION:  Education details: rationale for interventions, HEP Person educated: Patient Education method: Explanation, Demonstration Education comprehension: verbalized understanding and returned demonstration  HOME EXERCISE PROGRAM: Access Code: [email or web address]   ASSESSMENT: CLINICAL IMPRESSION: Pt reports pain is elevated and attributes it to discontinuing her pain medicine. Continued with knee strength and single leg stability on unlevel surface. Pt did well with prescribed therex.      OBJECTIVE IMPAIRMENTS: Abnormal gait, decreased activity tolerance, decreased balance, decreased mobility, difficulty walking, decreased ROM, decreased strength, and pain.   ACTIVITY LIMITATIONS: sitting, standing, squatting, sleeping, stairs, transfers, bed mobility, and locomotion level  PARTICIPATION LIMITATIONS: driving, shopping, community activity, occupation, and yard work  PERSONAL FACTORS: Fitness and 3+ comorbidities: HTN, Myasthenia Gravis, DM II, CKD  are also affecting patient's functional outcome.    GOALS: Goals reviewed with patient? No  SHORT TERM GOALS: Target date: = LTG   Pt will be compliant and knowledgeable with initial HEP for improved comfort and carryover Baseline: initial HEP given  Goal status: MET  2.  Pt will self report right knee pain no greater than 6/10 for improved comfort and functional ability Baseline: 10/10 at worst 09/01/23: can still reach 10/10 at night, better during day (5/10)   09/21/23 - 7/10 at night 10/23/2023: continues to report pain at worse 10/10 that wakes her up 11/01/23: wakes at night, not as painful, still 9-10/10 11/08/23: endorses up to 8/10 pain at times Goal status: ONGOING  LONG TERM GOALS: Target date: 12/04/2023   Pt will improve FOTO function score to no less than 54% as proxy for functional improvement Baseline: 26% function 09/21/23: 56% function 11/08/23: 59% Goal status: MET   2.  Pt will self report right knee pain no greater than 3/10 for improved comfort and functional ability Baseline: 10/10 at worst 10/23/2023: 10/10 pain at worst 11/01/23: 9-10/10 in mornings 11/08/23: up to 8/10 Goal status: ONGOING  3.  Pt will increase 30 Second Sit to Stand rep count to no less than 8 reps for improved balance, strength, and functional mobility Baseline: 4 reps  09/21/23: 6 reps 10/20/2023: 8 reps without UE support 11/08/23: 9.5reps  Goal status: MET  4.  Pt will decrease TUG score to no greater than 18 seconds with least restrictive assistive device for improved safety and decreased fall risk Baseline: 41 seconds FWW 09/21/23: 19 seconds no AD 10/23/2023: 15 seconds without AD Goal status: MET  5.  Pt will improve right knee AROM to at least range of 3-110 degrees for improved comfort, mobility, and gait Baseline: see ROM chart 09/13/23:AA 10-112 09/21/23: A 6-112 10/23/2023: 5-120 11/08/23: see ROM chart above Goal status: ONGOING   PLAN: PT FREQUENCY: 2x/week  PT DURATION: 6 weeks  PLANNED INTERVENTIONS: 97164- PT Re-evaluation, 97110-Therapeutic exercises, 97530- Therapeutic activity, 97112- Neuromuscular re-education, 97535- Self Care, 47829- Manual therapy, 97116- Gait training, 97014- Electrical stimulation (unattended), 97016- Vasopneumatic device, Patient/Family education, Balance training, Dry Needling, Joint mobilization, Cryotherapy, and Moist heat  PLAN FOR NEXT SESSION: continue with knee strength/control,  emphasis on quad   Jannette Spanner, PTA 11/13/23 2:41 PM Phone: [phone] Fax: [phone]     Referring diagnosis? U13.244 (ICD-10-CM) - Status post total right knee replacement  Treatment diagnosis? (if different than referring diagnosis) M25.561 (ICD-10-CM) - Acute pain of right knee What was this (referring dx) caused by? [x]  Surgery []  Fall []  Ongoing issue []  Arthritis []  Other: ____________  Laterality: [x]  Rt []  Lt []  Both  Check all possible CPT codes:  *CHOOSE 10 OR LESS*    See Planned Interventions listed in the Plan section of the Evaluation.

## 2023-11-15 ENCOUNTER — Ambulatory Visit: Payer: Medicare HMO | Admitting: Physical Therapy

## 2023-11-15 ENCOUNTER — Encounter: Payer: Self-pay | Admitting: Physical Therapy

## 2023-11-15 DIAGNOSIS — M25561 Pain in right knee: Secondary | ICD-10-CM

## 2023-11-15 DIAGNOSIS — R2689 Other abnormalities of gait and mobility: Secondary | ICD-10-CM

## 2023-11-15 DIAGNOSIS — M6281 Muscle weakness (generalized): Secondary | ICD-10-CM | POA: Diagnosis not present

## 2023-11-15 NOTE — Therapy (Signed)
OUTPATIENT PHYSICAL THERAPY TREATMENT NOTE     Patient Name: Gloria [name] MRN: [id number] DOB:[birth year], 72 y.o., female Today's Date: 11/15/2023        END OF SESSION:  PT End of Session - 11/15/23 1326     Visit Number 22    Number of Visits 25    Date for PT Re-Evaluation 12/04/23    Authorization Type [employer] M[employer]    Authorization Time Period 09/18/2023 - 11/04/2023; 11/06/23-12/09/23    Authorization - Visit Number 5    Authorization - Number of Visits 8    PT Start Time 1323    PT Stop Time 1411    PT Time Calculation (min) 48 min               Past Medical History:  Diagnosis Date   Alopecia areata 10/2010 onset   Arthritis    Diabetes mellitus, type 2 (HCC)    no longer per pt   Dyslipidemia    Hyperlipidemia    on meds   Hypertension    on meds   Hypothyroid    on meds   Myasthenia gravis    Past Surgical History:  Procedure Laterality Date   ABDOMINAL HYSTERECTOMY  12/12/1988   CARPAL TUNNEL RELEASE Left    CARPAL TUNNEL RELEASE Right    COLONOSCOPY  2008   [facility]   TOTAL KNEE ARTHROPLASTY Right 07/14/2023   Procedure: RIGHT TOTAL KNEE ARTHROPLASTY;  Surgeon: Kathryne Hitch, MD;  Location: [facility];  Service: Orthopedics;  Laterality: Right;   Patient Active Problem List   Diagnosis Date Noted   Abdominal cramping 10/06/2023   Overactive bladder 09/06/2023   Status post total right knee replacement 07/14/23 07/14/2023   CKD (chronic kidney disease) stage 3, GFR 30-59 ml/min (HCC) 03/08/2023   Arthropathy of lumbar facet joint 11/14/2022   Bilateral low back pain without sciatica 09/29/2021   Bradycardia 08/20/2020   Acute cervical radiculopathy 07/09/2020   Pruritus 02/06/2020   Raynaud's phenomenon 01/06/2020   Left wrist tendinitis 04/30/2018   Hand numbness 01/24/2018   Lumbar radiculopathy 12/23/2016   Plantar fasciitis of right foot 06/22/2016   Carpal tunnel syndrome of right wrist 03/07/2013   Myasthenia gravis  without exacerbation (HCC) 12/31/2010   Alopecia 12/31/2010   Hypothyroidism 12/29/2010   Type 2 diabetes, diet controlled (HCC) 12/29/2010   Hyperlipidemia 12/29/2010   Essential hypertension 12/29/2010   Coronary atherosclerosis 12/29/2010   Osteoarthritis 12/29/2010    PCP: Pincus Sanes, MD  REFERRING PROVIDER: Kathryne Hitch, MD  REFERRING DIAG:  [phone] (ICD-10-CM) - Status post total right knee replacement M17.11 (ICD-10-CM) - Unilateral primary osteoarthritis, right knee  THERAPY DIAG:  Acute pain of right knee  Muscle weakness (generalized)  Other abnormalities of gait and mobility  Rationale for Evaluation and Treatment: Rehabilitation  ONSET DATE: 07/14/2023   SUBJECTIVE:  SUBJECTIVE STATEMENT: Pt reports her back is hurting today. Knee is better than it was , 5/10.    EVAL: Pt presents to PT s/p R TKA performed by Dr. Magnus Ivan on 07/14/2023. Had been seeing HHPT for last few weeks since after surgery. Has had difficulty with stair navigation and notes general increase in pain today. States she doesn't feel like she can do much due to pain, not sure why today is uncomfortable. Denies pain in calf, PT educated on s/s of DVT.   PAIN:  Are you having pain?  Yes: NPRS scale: 5/10 Worst: 9-10/10 Pain location: Right knee Pain description:  sore, post exercise Aggravating factors: walking, standing Relieving factors: rest, ice  PERTINENT HISTORY: HTN, Myasthenia Gravis, DM II, CKD  PRECAUTIONS: None  RED FLAGS: None   WEIGHT BEARING RESTRICTIONS: No  FALLS:  Has patient fallen in last 6 months? No  LIVING ENVIRONMENT: Lives with: lives alone Lives in: House/apartment Stairs: Yes: Internal: 14 steps; on right going up and External: 2 steps; none Has following equipment at home: Walker - 2 wheeled  OCCUPATION: Retired  PLOF: Independent  PATIENT GOALS: improve right knee to be able to get back to walking and working out   OBJECTIVE:   PATIENT SURVEYS:  FOTO: 26% function; 54% predicted 09/21/23: 56% function 11/08/23: 59%  COGNITION: Overall cognitive status: Within functional limits for tasks assessed     SENSATION: Light touch: Impaired - lateral R LE  EDEMA:  DNT  POSTURE: rounded shoulders and forward head  PALPATION: TTP to distal R quad, patellar hypomobility  LOWER EXTREMITY ROM:  Active ROM Right eval Rt 08/17/23 Rt 08/22/23 RT 09/01/23 RT 09/13/23 RT 09/21/23 RT 09/28/23 RT  10/04/23 RT 10/13/23 Rt 10/18/23 Rt 10/23/23 RT 11/01/23 Right 11/08/23  Knee flexion 72 sitting AA90d supine 92 AA 96 A/105 AA AA112 A 112 A 112 A 120 A 120 120 120 123 121 deg  Knee extension 19 13d 10 d  10d A 6  A 4  A 3 After manual  5  Lacking 3    (Blank rows = not tested)  LOWER EXTREMITY MMT:  MMT Right eval Left eval Rt / Lt 10/23/2023 Right/Left 11/08/23  Knee flexion DNT 4/5 4 / 5 4+ / 5   Knee extension DNT 4/5 4 / 5 4 / 5    (Blank rows = not tested)  LOWER EXTREMITY SPECIAL TESTS:  DNT  FUNCTIONAL TESTS:  TUG: 41 seconds with FWW  09/21/2023: 19 seconds no AD  10/23/2023: 15 seconds without AD  11/08/23: 12.08sec no AD 30 Second Sit to Stand: 4 reps with B UE  10/20/2023: 8 reps without UE support 11/08/23: 9.5 reps gentle UE support from thighs, fatigue   GAIT: Distance walked: 65ft Assistive device utilized: Environmental consultant - 2 wheeled Level of assistance: Modified independence Comments: antalgic gait R, decreased knee ext R   TREATMENT: [facility] Adult PT Treatment:                                                DATE: 11/15/23 Therapeutic Exercise: Rec Bike L2 x 5 minutes Leg press Omega single leg   35# 10 x 2 Knee ext 15# 15 x 2 Knee flex 25# 15 x 2 4 inch forward step down x 4 - increased pain  4 inch lateral step downs x12 STS with 10# from bariatric chair  8 inch step stretch for knee  flexion and extension Slant board stretch  Modalities: Ice pack x 10 min right knee    [facility]  Adult PT Treatment:                                                DATE: 11/13/23 Therapeutic Exercise: Rec Bike x 5 min STS bariatric chair x 10 Right SLS on foam with left hip abdct, ext  Right gastroc stretch  Right 6 inch lateral step down 10 x 2  Leg press Omega single leg   35# 10 x 2 Knee ext 15# 15 x 2 Knee flex 25# 12 x 2   Modalities: ice Pack x 10 min right knee     [facility] Adult PT Treatment:                                                DATE: 11/08/23 Therapeutic Exercise: Omega knee extension BIL 15# 2x12 Omega knee flexion BIL 20# 2x12 TKE into swiss ball 2x10 emphasis on quad contraction SL hip abduction x12 SL hip circle x5 CW/CCW   Therapeutic Activity: 30sec STS + education TUG + education MSK assessment + education FOTO + education Education/discussion re: progress with PT, symptom behavior as it affects activity tolerance, PT goals/POC   Modalities: Cold pack R knee w/ LE support, 10 min no adverse events and good relief    [facility] Adult PT Treatment:                                                DATE: 11/01/23 Therapeutic Exercise: Rec Bike L2 x 5 minutes  Knee ext 15# 10 x 3  Knee flexion 20# 10 x 3  Leg press Omega single leg  25# 10 x 1, 35# 10 x 1 STS low chair x 10 6 inch lateral step down  Supine with strap h/s stretch Supine QS 5 sec x 10 8 inch step stretch  Manual Therapy: Hooklying knee A/P mobs Supine patellar mobs all directions Supine knee PROM flexion and extension Contract relax hamstrings   Modalities: Ice pack 10 minutes   [facility] Adult PT Treatment:                                                DATE: 11/01/23 Therapeutic Exercise: Rec Bike L1 x 5 minutes  Knee ext 15# 10 x 3  Knee flexion 20# 10 x 3  Leg press Omega 50# 10 x 3  Step up 6 inch runners , 10 x 2 right Supine QS Bridge S/L hip abduction 10 x 2 right  Manual Therapy: Hooklying knee A/P mobs Supine patellar mobs all directions Supine knee PROM flexion  and extension Contract relax hamstrings   Modalities: Ice pack 10 minutes    [facility] Adult PT Treatment:                                                DATE: 10/23/23 Therapeutic Exercise: Recumbent bike L1 x 5 min while taking subjective Knee extension machine 15# 3 x 10 Knee flexion machine 20# 2 x 10 Leg press (BATCA) 45# 3 x 10 Forward 6" step up 2 x 10 each Standing hip abduction with red at knees 2 x 10 each SLR 3 x 10 Bridge 2 x 10 Modalities: Ice pack x 10 minutes post session   [facility] Adult PT Treatment:  DATE: 10/20/23 Therapeutic Exercise: Recumbent bike L2 x 3 min while taking subjective Knee extension machine 15# 3 x 10 Knee flexion machine 20# 2 x 10 Leg press (BATCA) 45# 3 x 10 Forward 6" step up 2 x 10 each Sidelying hip abduction 3 x 10 with right Bridge 2 x 10 Sit to stand 2 x 10 Standing TKE with green 2 x 10 Modalities: Ice pack x 10 minutes post session      PATIENT EDUCATION:  Education details: rationale for interventions, HEP Person educated: Patient Education method: Programmer, multimedia, Demonstration Education comprehension: verbalized understanding and returned demonstration  HOME EXERCISE PROGRAM: Access Code: [email or web address]   ASSESSMENT: CLINICAL IMPRESSION: Pt reports pain is improved compared to last session. She had increased pain with closed chain eccentric quad progression. Continued with knee strengthening and flexibility.       OBJECTIVE IMPAIRMENTS: Abnormal gait, decreased activity tolerance, decreased balance, decreased mobility, difficulty walking, decreased ROM, decreased strength, and pain.   ACTIVITY LIMITATIONS: sitting, standing, squatting, sleeping, stairs, transfers, bed mobility, and locomotion level  PARTICIPATION LIMITATIONS: driving, shopping, community activity, occupation, and yard work  PERSONAL FACTORS: Fitness and 3+ comorbidities: HTN, Myasthenia Gravis, DM II, CKD  are  also affecting patient's functional outcome.    GOALS: Goals reviewed with patient? No  SHORT TERM GOALS: Target date: = LTG   Pt will be compliant and knowledgeable with initial HEP for improved comfort and carryover Baseline: initial HEP given  Goal status: MET  2.  Pt will self report right knee pain no greater than 6/10 for improved comfort and functional ability Baseline: 10/10 at worst 09/01/23: can still reach 10/10 at night, better during day (5/10)  09/21/23 - 7/10 at night 10/23/2023: continues to report pain at worse 10/10 that wakes her up 11/01/23: wakes at night, not as painful, still 9-10/10 11/08/23: endorses up to 8/10 pain at times Goal status: ONGOING  LONG TERM GOALS: Target date: 12/04/2023   Pt will improve FOTO function score to no less than 54% as proxy for functional improvement Baseline: 26% function 09/21/23: 56% function 11/08/23: 59% Goal status: MET   2.  Pt will self report right knee pain no greater than 3/10 for improved comfort and functional ability Baseline: 10/10 at worst 10/23/2023: 10/10 pain at worst 11/01/23: 9-10/10 in mornings 11/08/23: up to 8/10 Goal status: ONGOING  3.  Pt will increase 30 Second Sit to Stand rep count to no less than 8 reps for improved balance, strength, and functional mobility Baseline: 4 reps  09/21/23: 6 reps 10/20/2023: 8 reps without UE support 11/08/23: 9.5reps  Goal status: MET  4.  Pt will decrease TUG score to no greater than 18 seconds with least restrictive assistive device for improved safety and decreased fall risk Baseline: 41 seconds FWW 09/21/23: 19 seconds no AD 10/23/2023: 15 seconds without AD Goal status: MET  5.  Pt will improve right knee AROM to at least range of 3-110 degrees for improved comfort, mobility, and gait Baseline: see ROM chart 09/13/23:AA 10-112 09/21/23: A 6-112 10/23/2023: 5-120 11/08/23: see ROM chart above Goal status: ONGOING   PLAN: PT FREQUENCY:  2x/week  PT DURATION: 6 weeks  PLANNED INTERVENTIONS: 97164- PT Re-evaluation, 97110-Therapeutic exercises, 97530- Therapeutic activity, 97112- Neuromuscular re-education, 97535- Self Care, 16109- Manual therapy, 97116- Gait training, 97014- Electrical stimulation (unattended), 97016- Vasopneumatic device, Patient/Family education, Balance training, Dry Needling, Joint mobilization, Cryotherapy, and Moist heat  PLAN FOR NEXT SESSION: continue with knee strength/control, emphasis on quad  Jannette Spanner, PTA 11/15/23 3:53 PM Phone: [phone] Fax: [phone]     Referring diagnosis? G95.621 (ICD-10-CM) - Status post total right knee replacement  Treatment diagnosis? (if different than referring diagnosis) M25.561 (ICD-10-CM) - Acute pain of right knee What was this (referring dx) caused by? [x]  Surgery []  Fall []  Ongoing issue []  Arthritis []  Other: ____________  Laterality: [x]  Rt []  Lt []  Both  Check all possible CPT codes:  *CHOOSE 10 OR LESS*    See Planned Interventions listed in the Plan section of the Evaluation.

## 2023-11-19 ENCOUNTER — Other Ambulatory Visit: Payer: Self-pay | Admitting: Internal Medicine

## 2023-11-20 ENCOUNTER — Encounter: Payer: Self-pay | Admitting: Physical Therapy

## 2023-11-20 ENCOUNTER — Ambulatory Visit: Payer: Medicare HMO | Admitting: Physical Therapy

## 2023-11-20 DIAGNOSIS — M6281 Muscle weakness (generalized): Secondary | ICD-10-CM | POA: Diagnosis not present

## 2023-11-20 DIAGNOSIS — M25561 Pain in right knee: Secondary | ICD-10-CM

## 2023-11-20 DIAGNOSIS — R2689 Other abnormalities of gait and mobility: Secondary | ICD-10-CM | POA: Diagnosis not present

## 2023-11-20 NOTE — Therapy (Signed)
OUTPATIENT PHYSICAL THERAPY TREATMENT NOTE     Patient Name: Gloria [name] MRN: [id number] DOB:1951/04/22, 72 y.o., female Today's Date: 11/20/2023        END OF SESSION:  PT End of Session - 11/20/23 1324     Visit Number 23    Number of Visits 25    Date for PT Re-Evaluation 12/04/23    Authorization Type [employer] MCR    Authorization Time Period 09/18/2023 - 11/04/2023; 11/06/23-12/09/23    Authorization - Visit Number 6    Authorization - Number of Visits 8    PT Start Time 1322    PT Stop Time 1410    PT Time Calculation (min) 48 min               Past Medical History:  Diagnosis Date   Alopecia areata 10/2010 onset   Arthritis    Diabetes mellitus, type 2 (HCC)    no longer per pt   Dyslipidemia    Hyperlipidemia    on meds   Hypertension    on meds   Hypothyroid    on meds   Myasthenia gravis    Past Surgical History:  Procedure Laterality Date   ABDOMINAL HYSTERECTOMY  12/12/1988   CARPAL TUNNEL RELEASE Left    CARPAL TUNNEL RELEASE Right    COLONOSCOPY  2008   [facility]   TOTAL KNEE ARTHROPLASTY Right 07/14/2023   Procedure: RIGHT TOTAL KNEE ARTHROPLASTY;  Surgeon: Kathryne Hitch, MD;  Location: [facility];  Service: Orthopedics;  Laterality: Right;   Patient Active Problem List   Diagnosis Date Noted   Abdominal cramping 10/06/2023   Overactive bladder 09/06/2023   Status post total right knee replacement 07/14/23 07/14/2023   CKD (chronic kidney disease) stage 3, GFR 30-59 ml/min (HCC) 03/08/2023   Arthropathy of lumbar facet joint 11/14/2022   Bilateral low back pain without sciatica 09/29/2021   Bradycardia 08/20/2020   Acute cervical radiculopathy 07/09/2020   Pruritus 02/06/2020   Raynaud's phenomenon 01/06/2020   Left wrist tendinitis 04/30/2018   Hand numbness 01/24/2018   Lumbar radiculopathy 12/23/2016   Plantar fasciitis of right foot 06/22/2016   Carpal tunnel syndrome of right wrist 03/07/2013   Myasthenia gravis  without exacerbation (HCC) 12/31/2010   Alopecia 12/31/2010   Hypothyroidism 12/29/2010   Type 2 diabetes, diet controlled (HCC) 12/29/2010   Hyperlipidemia 12/29/2010   Essential hypertension 12/29/2010   Coronary atherosclerosis 12/29/2010   Osteoarthritis 12/29/2010    PCP: Pincus Sanes, MD  REFERRING PROVIDER: Kathryne Hitch, MD  REFERRING DIAG:  [phone] (ICD-10-CM) - Status post total right knee replacement M17.11 (ICD-10-CM) - Unilateral primary osteoarthritis, right knee  THERAPY DIAG:  Acute pain of right knee  Muscle weakness (generalized)  Other abnormalities of gait and mobility  Rationale for Evaluation and Treatment: Rehabilitation  ONSET DATE: 07/14/2023   SUBJECTIVE:  SUBJECTIVE STATEMENT: Pt reports her back continues to hurt. Will make an appt with MD for back. Pain in knee 4/10.    EVAL: Pt presents to PT s/p R TKA performed by Dr. Magnus Ivan on 07/14/2023. Had been seeing HHPT for last few weeks since after surgery. Has had difficulty with stair navigation and notes general increase in pain today. States she doesn't feel like she can do much due to pain, not sure why today is uncomfortable. Denies pain in calf, PT educated on s/s of DVT.   PAIN:  Are you having pain?  Yes: NPRS scale: 4/10 Worst: 9-10/10 Pain location:  Right knee Pain description: sore, post exercise Aggravating factors: walking, standing Relieving factors: rest, ice  PERTINENT HISTORY: HTN, Myasthenia Gravis, DM II, CKD  PRECAUTIONS: None  RED FLAGS: None   WEIGHT BEARING RESTRICTIONS: No  FALLS:  Has patient fallen in last 6 months? No  LIVING ENVIRONMENT: Lives with: lives alone Lives in: House/apartment Stairs: Yes: Internal: 14 steps; on right going up and External: 2 steps; none Has following equipment at home: Walker - 2 wheeled  OCCUPATION: Retired  PLOF: Independent  PATIENT GOALS: improve right knee to be able to get back to walking and working  out   OBJECTIVE:  PATIENT SURVEYS:  FOTO: 26% function; 54% predicted 09/21/23: 56% function 11/08/23: 59%  COGNITION: Overall cognitive status: Within functional limits for tasks assessed     SENSATION: Light touch: Impaired - lateral R LE  EDEMA:  DNT  POSTURE: rounded shoulders and forward head  PALPATION: TTP to distal R quad, patellar hypomobility  LOWER EXTREMITY ROM:  Active ROM Right eval Rt 08/17/23 Rt 08/22/23 RT 09/01/23 RT 09/13/23 RT 09/21/23 RT 09/28/23 RT  10/04/23 RT 10/13/23 Rt 10/18/23 Rt 10/23/23 RT 11/01/23 Right 11/08/23  Knee flexion 72 sitting AA90d supine 92 AA 96 A/105 AA AA112 A 112 A 112 A 120 A 120 120 120 123 121 deg  Knee extension 19 13d 10 d  10d A 6  A 4  A 3 After manual  5  Lacking 3    (Blank rows = not tested)  LOWER EXTREMITY MMT:  MMT Right eval Left eval Rt / Lt 10/23/2023 Right/Left 11/08/23  Knee flexion DNT 4/5 4 / 5 4+ / 5   Knee extension DNT 4/5 4 / 5 4 / 5    (Blank rows = not tested)  LOWER EXTREMITY SPECIAL TESTS:  DNT  FUNCTIONAL TESTS:  TUG: 41 seconds with FWW  09/21/2023: 19 seconds no AD  10/23/2023: 15 seconds without AD  11/08/23: 12.08sec no AD 30 Second Sit to Stand: 4 reps with B UE  10/20/2023: 8 reps without UE support 11/08/23: 9.5 reps gentle UE support from thighs, fatigue   GAIT: Distance walked: 44ft Assistive device utilized: Environmental consultant - 2 wheeled Level of assistance: Modified independence Comments: antalgic gait R, decreased knee ext R   TREATMENT: [facility] Adult PT Treatment:                                                DATE: 11/20/23 Therapeutic Exercise: Rec Bike L2 x 5 minutes  Leg press Omega single leg  35# 10 x 2 Knee ext 15# 15 x 2 Knee flex 25# 15 x 2 STS with 10# from bariatric chair  10 x 2  8 inch step stretch for knee  flexion and extension Slant board stretch   [facility] Adult PT Treatment:                                                DATE: 11/15/23 Therapeutic  Exercise: Rec Bike L2 x 5 minutes Leg press Omega single leg  35# 10 x 2 Knee ext 15# 15 x 2 Knee flex 25# 15 x 2 4 inch forward step down x 4 - increased pain  4 inch lateral  step downs x12 STS with 10# from bariatric chair  8 inch step stretch for knee  flexion and extension Slant board stretch  Modalities: Ice pack x 10 min right knee    [facility] Adult PT Treatment:                                                DATE: 11/13/23 Therapeutic Exercise: Rec Bike x 5 min STS bariatric chair x 10 Right SLS on foam with left hip abdct, ext  Right gastroc stretch  Right 6 inch lateral step down 10 x 2  Leg press Omega single leg   35# 10 x 2 Knee ext 15# 15 x 2 Knee flex 25# 12 x 2   Modalities: ice Pack x 10 min right knee     [facility] Adult PT Treatment:                                                DATE: 11/08/23 Therapeutic Exercise: Omega knee extension BIL 15# 2x12 Omega knee flexion BIL 20# 2x12 TKE into swiss ball 2x10 emphasis on quad contraction SL hip abduction x12 SL hip circle x5 CW/CCW   Therapeutic Activity: 30sec STS + education TUG + education MSK assessment + education FOTO + education Education/discussion re: progress with PT, symptom behavior as it affects activity tolerance, PT goals/POC   Modalities: Cold pack R knee w/ LE support, 10 min no adverse events and good relief    [facility] Adult PT Treatment:                                                DATE: 11/01/23 Therapeutic Exercise: Rec Bike L2 x 5 minutes  Knee ext 15# 10 x 3  Knee flexion 20# 10 x 3  Leg press Omega single leg  25# 10 x 1, 35# 10 x 1 STS low chair x 10 6 inch lateral step down  Supine with strap h/s stretch Supine QS 5 sec x 10 8 inch step stretch  Manual Therapy: Hooklying knee A/P mobs Supine patellar mobs all directions Supine knee PROM flexion and extension Contract relax hamstrings   Modalities: Ice pack 10 minutes   [facility] Adult PT Treatment:                                                 DATE: 11/01/23 Therapeutic Exercise: Rec Bike L1 x 5 minutes  Knee ext 15# 10 x 3  Knee flexion 20# 10 x 3  Leg press Omega 50# 10 x 3  Step up 6 inch runners , 10 x 2 right Supine QS Bridge S/L hip abduction 10 x 2 right  Manual Therapy: Hooklying knee A/P mobs Supine patellar mobs all directions Supine knee PROM flexion and extension Contract relax hamstrings   Modalities: Ice pack 10 minutes    [facility] Adult PT Treatment:  DATE: 10/23/23 Therapeutic Exercise: Recumbent bike L1 x 5 min while taking subjective Knee extension machine 15# 3 x 10 Knee flexion machine 20# 2 x 10 Leg press (BATCA) 45# 3 x 10 Forward 6" step up 2 x 10 each Standing hip abduction with red at knees 2 x 10 each SLR 3 x 10 Bridge 2 x 10 Modalities: Ice pack x 10 minutes post session   [facility] Adult PT Treatment:                                                DATE: 10/20/23 Therapeutic Exercise: Recumbent bike L2 x 3 min while taking subjective Knee extension machine 15# 3 x 10 Knee flexion machine 20# 2 x 10 Leg press (BATCA) 45# 3 x 10 Forward 6" step up 2 x 10 each Sidelying hip abduction 3 x 10 with right Bridge 2 x 10 Sit to stand 2 x 10 Standing TKE with green 2 x 10 Modalities: Ice pack x 10 minutes post session      PATIENT EDUCATION:  Education details: rationale for interventions, HEP Person educated: Patient Education method: Programmer, multimedia, Demonstration Education comprehension: verbalized understanding and returned demonstration  HOME EXERCISE PROGRAM: Access Code: [email or web address]   ASSESSMENT: CLINICAL IMPRESSION: Pt reports pain is improved compared to last session. Her pain level has decreased to 4/10, STG# 2 met, progressing toward LTG# 2.  Continued with knee strengthening and flexibility with good tolerance.       OBJECTIVE IMPAIRMENTS: Abnormal gait, decreased activity tolerance, decreased balance,  decreased mobility, difficulty walking, decreased ROM, decreased strength, and pain.   ACTIVITY LIMITATIONS: sitting, standing, squatting, sleeping, stairs, transfers, bed mobility, and locomotion level  PARTICIPATION LIMITATIONS: driving, shopping, community activity, occupation, and yard work  PERSONAL FACTORS: Fitness and 3+ comorbidities: HTN, Myasthenia Gravis, DM II, CKD  are also affecting patient's functional outcome.    GOALS: Goals reviewed with patient? No  SHORT TERM GOALS: Target date: = LTG   Pt will be compliant and knowledgeable with initial HEP for improved comfort and carryover Baseline: initial HEP given  Goal status: MET  2.  Pt will self report right knee pain no greater than 6/10 for improved comfort and functional ability Baseline: 10/10 at worst 09/01/23: can still reach 10/10 at night, better during day (5/10)  09/21/23 - 7/10 at night 10/23/2023: continues to report pain at worse 10/10 that wakes her up 11/01/23: wakes at night, not as painful, still 9-10/10 11/08/23: endorses up to 8/10 pain at times 11/20/23: 4/10 Goal status: MET  LONG TERM GOALS: Target date: 12/04/2023   Pt will improve FOTO function score to no less than 54% as proxy for functional improvement Baseline: 26% function 09/21/23: 56% function 11/08/23: 59% Goal status: MET   2.  Pt will self report right knee pain no greater than 3/10 for improved comfort and functional ability Baseline: 10/10 at worst 10/23/2023: 10/10 pain at worst 11/01/23: 9-10/10 in mornings 11/08/23: up to 8/10 11/20/23: 4/10  Goal status: ONGOING  3.  Pt will increase 30 Second Sit to Stand rep count to no less than 8 reps for improved balance, strength, and functional mobility Baseline: 4 reps  09/21/23: 6 reps 10/20/2023: 8 reps without UE support 11/08/23: 9.5reps  Goal status: MET  4.  Pt will decrease TUG score to no greater than 18 seconds with least restrictive  assistive device for improved  safety and decreased fall risk Baseline: 41 seconds FWW 09/21/23: 19 seconds no AD 10/23/2023: 15 seconds without AD Goal status: MET  5.  Pt will improve right knee AROM to at least range of 3-110 degrees for improved comfort, mobility, and gait Baseline: see ROM chart 09/13/23:AA 10-112 09/21/23: A 6-112 10/23/2023: 5-120 11/08/23: see ROM chart above Goal status: ONGOING   PLAN: PT FREQUENCY: 2x/week  PT DURATION: 6 weeks  PLANNED INTERVENTIONS: 97164- PT Re-evaluation, 97110-Therapeutic exercises, 97530- Therapeutic activity, 97112- Neuromuscular re-education, 97535- Self Care, 10272- Manual therapy, 97116- Gait training, 97014- Electrical stimulation (unattended), 97016- Vasopneumatic device, Patient/Family education, Balance training, Dry Needling, Joint mobilization, Cryotherapy, and Moist heat  PLAN FOR NEXT SESSION: continue with knee strength/control, emphasis on quad   Jannette Spanner, PTA 11/20/23 2:31 PM Phone: [phone] Fax: [phone]     Referring diagnosis? I43.329 (ICD-10-CM) - Status post total right knee replacement  Treatment diagnosis? (if different than referring diagnosis) M25.561 (ICD-10-CM) - Acute pain of right knee What was this (referring dx) caused by? [x]  Surgery []  Fall []  Ongoing issue []  Arthritis []  Other: ____________  Laterality: [x]  Rt []  Lt []  Both  Check all possible CPT codes:  *CHOOSE 10 OR LESS*    See Planned Interventions listed in the Plan section of the Evaluation.

## 2023-11-22 ENCOUNTER — Telehealth: Payer: Self-pay | Admitting: Physical Medicine and Rehabilitation

## 2023-11-22 ENCOUNTER — Encounter: Payer: Self-pay | Admitting: Internal Medicine

## 2023-11-22 ENCOUNTER — Ambulatory Visit: Payer: Medicare HMO | Admitting: Internal Medicine

## 2023-11-22 VITALS — BP 136/80 | HR 74 | Temp 98.0°F | Ht 66.0 in | Wt 151.0 lb

## 2023-11-22 DIAGNOSIS — G8929 Other chronic pain: Secondary | ICD-10-CM | POA: Diagnosis not present

## 2023-11-22 DIAGNOSIS — R350 Frequency of micturition: Secondary | ICD-10-CM | POA: Diagnosis not present

## 2023-11-22 DIAGNOSIS — I10 Essential (primary) hypertension: Secondary | ICD-10-CM

## 2023-11-22 DIAGNOSIS — R300 Dysuria: Secondary | ICD-10-CM | POA: Diagnosis not present

## 2023-11-22 DIAGNOSIS — M545 Low back pain, unspecified: Secondary | ICD-10-CM

## 2023-11-22 LAB — POC URINALSYSI DIPSTICK (AUTOMATED)
Bilirubin, UA: NEGATIVE
Blood, UA: NEGATIVE
Glucose, UA: NEGATIVE
Ketones, UA: NEGATIVE
Leukocytes, UA: NEGATIVE
Nitrite, UA: NEGATIVE
Protein, UA: NEGATIVE
Spec Grav, UA: 1.01 (ref 1.010–1.025)
Urobilinogen, UA: 0.2 U/dL
pH, UA: 7.5 (ref 5.0–8.0)

## 2023-11-22 NOTE — Assessment & Plan Note (Signed)
Acute Started 2-3 weeks ago She states urinary frequency and urgency Urine dip here negative for infection-will send for culture No antibiotics unless culture comes back positive Has been taking Myrbetriq for a while and has not had any issues so it is unlikely related, but advised her to hold for now-can restart once her urinary symptoms get better Call if no improvement

## 2023-11-22 NOTE — Telephone Encounter (Signed)
Pt called requesting a call back to set an appt for back injection. Pt state her PCP sent referral. Last injection was 10/12/2022. Pt phone number is [phone].

## 2023-11-22 NOTE — Assessment & Plan Note (Signed)
Acute on chronic Has a history of mid lower back pain that radiates across the lower back without radiculopathy Has started having this pain again Think she needs another injection-saw Dr. Alvester Morin in the past and would like to see him again-referral ordered Okay to take Aleve as long as she is not taking on a regular basis for the pain until then

## 2023-11-22 NOTE — Progress Notes (Signed)
Subjective:    Patient ID: Gloria [name], female    DOB: 02-Sep-1951, 72 y.o.   MRN: [id number]      HPI Lenzy is here for  Chief Complaint  Patient presents with   Abdominal Pain   Urinary Frequency   Back Pain    Patient wants to be referred for cortisone shot (seeing Ortho but not until 12/25/23)     Symptoms started 2-3 weeks.  Has frequent urination - can be 5-6 times in one hour.  When she does have to go she does have urgency.  She has not seen any blood in the urine and denies any pain with urination.  She denies any abdominal pain.    sees ortho on 1/13 on f/u of right knee pain.  He had referred her to Dr. Alvester Morin previously for back pain and she did have an injection from him which helped.  She has been having increased back pain.   Back pain -back pain is in the mid lower back and radiates across the entire lower back.  She denies any radiation down into her legs.  She denies any numbness, tingling or weakness in the legs.  She does take Aleve on occasion that does help.  She thinks she needs another injection which did help for a year.     Medications and allergies reviewed with patient and updated if appropriate.  Current Outpatient Medications on File Prior to Visit  Medication Sig Dispense Refill   acetaminophen (TYLENOL) 500 MG tablet Take 500 mg by mouth every 6 (six) hours as needed for moderate pain or mild pain.     amLODipine (NORVASC) 5 MG tablet TAKE 1 TABLET(5 MG) BY MOUTH DAILY 90 tablet 2   Ascorbic Acid (VITAMIN C PO) Take 1,000 mg by mouth daily at 6 (six) AM.     aspirin 81 MG chewable tablet Chew 1 tablet (81 mg total) by mouth 2 (two) times daily. 30 tablet 0   atorvastatin (LIPITOR) 10 MG tablet TAKE 1 TABLET BY MOUTH DAILY AT 6 PM 90 tablet 2   BIOTIN 5000 PO Take 10,000 mcg by mouth daily. Hair, skin, Nails     Blood Pressure Monitor DEVI Use to check blood pressure once daily 1 each 0   Blood Pressure Monitoring (ADULT BLOOD PRESSURE  CUFF LG) KIT 1 kit by Does not apply route daily. 1 kit 0   Blood Pressure Monitoring (BLOOD PRESSURE MONITOR AUTOMAT) DEVI 1 kit by Does not apply route as directed. Please dispense blood pressure kit covered by insurance 1 each 0   CALCIUM-MAGNESIUM-ZINC PO Take 1 tablet by mouth daily at 6 (six) AM.     celecoxib (CELEBREX) 200 MG capsule TAKE 1 CAPSULE(200 MG) BY MOUTH TWICE DAILY BETWEEN MEALS AS NEEDED 60 capsule 1   Cholecalciferol (VITAMIN D) 50 MCG (2000 UT) CAPS Take 2,000 Units by mouth daily.     Cyanocobalamin (VITAMIN B 12 PO) Take 3,000 mcg by mouth daily at 6 (six) AM.     diclofenac Sodium (VOLTAREN) 1 % GEL Apply 4 g topically 4 (four) times daily. (Patient taking differently: Apply 4 g topically daily as needed (pain).) 100 g 5   Ferrous Sulfate Dried (SLOW RELEASE IRON) 45 MG TBCR Take 1 tablet by mouth daily.     levothyroxine (SYNTHROID) 100 MCG tablet TAKE 1 TABLET EVERY DAY 30 MINS PRIOR TO BREAKFAST 6 DAYS A WEEK AND 1/2 TABLET 1 DAY A WEEK 90 tablet 1  methocarbamol (ROBAXIN) 500 MG tablet Take 1 tablet (500 mg total) by mouth every 6 (six) hours as needed. 40 tablet 1   mirabegron ER (MYRBETRIQ) 25 MG TB24 tablet Take 1 tablet (25 mg total) by mouth daily. 90 tablet 1   Multiple Vitamins-Minerals (WOMENS 50+ MULTI VITAMIN PO) Take 1 tablet by mouth daily at 6 (six) AM.     mupirocin ointment (BACTROBAN) 2 % Apply to affected area daily and as needed. 30 g 0   Omega-3 1000 MG CAPS Take 1,000 mg by mouth daily.     oxyCODONE (OXY IR/ROXICODONE) 5 MG immediate release tablet Take 1 tablet (5 mg total) by mouth 2 (two) times daily as needed for moderate pain (pain score 4-6) (pain score 4-6). 30 tablet 0   Polyethyl Glycol-Propyl Glycol (SYSTANE) 0.4-0.3 % SOLN Place 1 drop into both eyes daily as needed (dry eyes).     telmisartan (MICARDIS) 80 MG tablet TAKE 1 TABLET(80 MG) BY MOUTH DAILY 90 tablet 1   tiZANidine (ZANAFLEX) 4 MG tablet Take 1 tablet (4 mg total) by mouth  every 8 (eight) hours as needed for muscle spasms. 60 tablet 1   triamcinolone cream (KENALOG) 0.1 % APPLY TOPICALLY TO THE AFFECTED AREA TWICE DAILY 30 g 0   No current facility-administered medications on file prior to visit.    Review of Systems  Constitutional:  Negative for chills and fever.  Gastrointestinal:  Negative for abdominal pain, constipation, diarrhea and nausea.  Genitourinary:  Positive for frequency and urgency. Negative for difficulty urinating, dysuria and hematuria.  Musculoskeletal:  Positive for back pain.       Objective:   Vitals:   11/22/23 1353  BP: (!) 140/90  Pulse: 74  Temp: 98 F (36.7 C)  SpO2: 95%   BP Readings from Last 3 Encounters:  11/22/23 (!) 140/90  10/06/23 136/72  09/06/23 (!) 140/78   Wt Readings from Last 3 Encounters:  11/22/23 151 lb (68.5 kg)  10/06/23 147 lb (66.7 kg)  09/06/23 145 lb (65.8 kg)   Body mass index is 24.37 kg/m.    Physical Exam Constitutional:      General: She is not in acute distress.    Appearance: Normal appearance. She is not ill-appearing.  HENT:     Head: Normocephalic.  Eyes:     Conjunctiva/sclera: Conjunctivae normal.  Abdominal:     General: There is no distension.     Palpations: Abdomen is soft.     Tenderness: There is no abdominal tenderness. There is no right CVA tenderness, left CVA tenderness, guarding or rebound.  Musculoskeletal:        General: Tenderness (across lower back) present.  Skin:    General: Skin is warm and dry.  Neurological:     Mental Status: She is alert.            Assessment & Plan:    See Problem List for Assessment and Plan of chronic medical problems.

## 2023-11-22 NOTE — Patient Instructions (Addendum)
      Your urine does not look infected.  We will send it for culture.      Medications changes include :   hold the myrbetriq for now.  You can restart once your urine symptoms resolve.      A referral was ordered Dr Alvester Morin and someone will call you to schedule an appointment.     Return if symptoms worsen or fail to improve.

## 2023-11-22 NOTE — Assessment & Plan Note (Signed)
Chronic Initial BP elevated - repeat is better Continue amlodipine 5 mg daily, telmisartan 80 mg daily

## 2023-11-24 ENCOUNTER — Ambulatory Visit (INDEPENDENT_AMBULATORY_CARE_PROVIDER_SITE_OTHER): Payer: Medicare HMO | Admitting: Physical Medicine and Rehabilitation

## 2023-11-24 ENCOUNTER — Encounter: Payer: Self-pay | Admitting: Physical Medicine and Rehabilitation

## 2023-11-24 DIAGNOSIS — M47816 Spondylosis without myelopathy or radiculopathy, lumbar region: Secondary | ICD-10-CM | POA: Diagnosis not present

## 2023-11-24 DIAGNOSIS — G8929 Other chronic pain: Secondary | ICD-10-CM | POA: Diagnosis not present

## 2023-11-24 DIAGNOSIS — M545 Low back pain, unspecified: Secondary | ICD-10-CM

## 2023-11-24 LAB — URINE CULTURE

## 2023-11-24 MED ORDER — DIAZEPAM 5 MG PO TABS: ORAL_TABLET | ORAL | 0 refills | Status: DC

## 2023-11-24 MED ORDER — NITROFURANTOIN MONOHYD MACRO 100 MG PO CAPS: 100.0000 mg | ORAL_CAPSULE | Freq: Two times a day (BID) | ORAL | 0 refills | Status: DC

## 2023-11-24 NOTE — Progress Notes (Unsigned)
Gloria [name] - 72 y.o. female MRN [id number]  Date of birth: 23-Apr-1951  Office Visit Note: Visit Date: 11/24/2023 PCP: Pincus Sanes, MD Referred by: Pincus Sanes, MD  Subjective: Chief Complaint  Patient presents with   Lower Back - Pain   HPI: Gloria [name] is a 72 y.o. female who comes in today for evaluation of chronic, worsening and severe bilateral lower back pain. Pain ongoing for several years, worsens with bending, standing and activity. She describes as sore and aching sensation, currently rates as 8 out of 10. Severe pain when moving from sitting to standing position and getting out of bed in the morning. Some relief of pain with home exercise regimen, rest and use of medications. History of dedicated physical therapy several years ago with some relief of pain. Lumbar MRI imaging from 2021 exhibits multi level facet hypertrophy, most severe at L4-L5 where there is grade 1 anterolisthesis, left sided marrow edema and per report advanced spinal canal stenosis. After review of imaging, spinal canal stenosis at L4-L5 looks to be moderate. Patient has done well with intermittent bilateral L4-L5 facet joint injections, most recent was 10/12/2022. She reports greater than 80% relief of pain with injection, lasting 6 or more months, she also reports increased functional ability post injection. She is managed by Dr. Doneen Poisson from orthopedic standpoint, she underwent right total knee arthroplasty with Dr. Magnus Ivan in August. Patient denies focal weakness, numbness and tingling. No recent trauma or falls.      Review of Systems  Musculoskeletal:  Positive for back pain.  Neurological:  Negative for tingling, sensory change, focal weakness and weakness.  All other systems reviewed and are negative.  Otherwise per HPI.  Assessment & Plan: Visit Diagnoses:    ICD-10-CM   1. Chronic bilateral low back pain without sciatica  M54.50 Ambulatory referral to Physical Medicine  Rehab   G89.29     2. Spondylosis without myelopathy or radiculopathy, lumbar region  M47.816 Ambulatory referral to Physical Medicine Rehab    3. Facet hypertrophy of lumbar region  M47.816 Ambulatory referral to Physical Medicine Rehab       Plan: Findings:  Chronic, worsening and severe bilateral axial back pain. Patient continues with conservative therapies such as formal physical therapy, home exercise regimen, rest and use of medications. Patients clinical presentation and exam are consistent with facet mediated pain. She does have pain with lumbar extension on exam today. Lumbar MRI imaging from 2022 exhibits severe facet arthropathy with anterolisthesis and left sided marrow edema at L4-L5. There is also moderate spinal canal stenosis at this level. Significant relief of pain with prior lumbar facet joint injections. We discussed treatment plan in detail today, next step is to perform diagnostic and hopefully therapeutic bilateral L4-L5 facet joint injections under fluoroscopic guidance. Would consider radiofrequency ablation procedure for possibility of longer sustained pain relief. Patient has no questions at this time. No red flag symptoms noted upon exam today.       Meds & Orders:  Meds ordered this encounter  Medications   diazepam (VALIUM) 5 MG tablet    Sig: Take one tablet by mouth with food one hour prior to procedure. May repeat 30 minutes prior if needed.    Dispense:  2 tablet    Refill:  0    Orders Placed This Encounter  Procedures   Ambulatory referral to Physical Medicine Rehab    Follow-up: Return for Bilateral L4-L5 facet joint injections.   Procedures: No  procedures performed      Clinical History: EXAM: MRI LUMBAR SPINE WITHOUT CONTRAST   TECHNIQUE: Multiplanar, multisequence MR imaging of the lumbar spine was performed. No intravenous contrast was administered.   COMPARISON:  Radiography 04/30/2018   FINDINGS: Segmentation:  5 lumbar type  vertebrae   Alignment:  Grade 1 anterolisthesis at L4-5   Vertebrae: Marrow edema about the left L4-5 facet. No acute fracture, discitis, or aggressive bone lesion   Conus medullaris and cauda equina: Conus extends to the L2 level. Conus and cauda equina appear normal.   Paraspinal and other soft tissues: Negative   Disc levels:   T12- L1: Unremarkable.   L1-L2: Disc narrowing and bulging. Mild facet spurring. Mild bilateral foraminal narrowing   L2-L3: Unremarkable.   L3-L4: Mild disc narrowing and bulging. Degenerative facet spurring and ligamentum flavum thickening. No neural impingement   L4-L5: Facet osteoarthritis which is advanced with bulky bony and ligamentous hypertrophy, anterolisthesis, and active facet arthritis on the left. The disc is narrowed and bulging and there is advanced spinal stenosis and biforaminal L4 root flattening.   L5-S1:Disc narrowing and bulging with endplate and facet spurring. High-grade Biforaminal impingement. Patent spinal canal.   IMPRESSION: 1. 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 Combined with disc bulging there is compressive spinal and biforaminal stenosis. 2. L5-S1 degenerative biforaminal impingement. 3. L1-2 noncompressive disc bulging.     Electronically Signed   By: Marnee Spring M.D.   On: 09/16/2020 09:36   She reports that she has never smoked. She has never used smokeless tobacco.  Recent Labs    03/08/23 1214 07/05/23 1156 09/06/23 1636  HGBA1C 6.1 6.1* 5.7    Objective:  VS:  HT:    WT:   BMI:     BP:   HR: bpm  TEMP: ( )  RESP:  Physical Exam Vitals and nursing note reviewed.  HENT:     Head: Normocephalic and atraumatic.     Right Ear: External ear normal.     Left Ear: External ear normal.     Nose: Nose normal.     Mouth/Throat:     Mouth: Mucous membranes are moist.  Eyes:     Extraocular Movements: Extraocular movements intact.   Cardiovascular:     Rate and Rhythm: Normal rate.     Pulses: Normal pulses.  Pulmonary:     Effort: Pulmonary effort is normal.  Abdominal:     General: Abdomen is flat. There is no distension.  Musculoskeletal:        General: Tenderness present.     Cervical back: Normal range of motion.     Comments: Patient rises from seated position to standing without difficulty. Pain noted with facet loading and lumbar extension. 5/5 strength noted with bilateral hip flexion, knee flexion/extension, ankle dorsiflexion/plantarflexion and EHL. No clonus noted bilaterally. No pain upon palpation of greater trochanters. No pain with internal/external rotation of bilateral hips. Sensation intact bilaterally. Negative slump test bilaterally. Ambulates without aid, gait steady.     Skin:    General: Skin is warm.     Capillary Refill: Capillary refill takes less than 2 seconds.  Neurological:     General: No focal deficit present.     Mental Status: She is alert and oriented to person, place, and time.  Psychiatric:        Mood and Affect: Mood normal.        Behavior: Behavior normal.     Ortho  Exam  Imaging: No results found.  Past Medical/Family/Surgical/Social History: Medications & Allergies reviewed per EMR, new medications updated. Patient Active Problem List   Diagnosis Date Noted   Urinary frequency 11/22/2023   Abdominal cramping 10/06/2023   Overactive bladder 09/06/2023   Status post total right knee replacement 07/14/23 07/14/2023   CKD (chronic kidney disease) stage 3, GFR 30-59 ml/min (HCC) 03/08/2023   Arthropathy of lumbar facet joint 11/14/2022   Chronic midline low back pain without sciatica 09/29/2021   Bradycardia 08/20/2020   Acute cervical radiculopathy 07/09/2020   Pruritus 02/06/2020   Raynaud's phenomenon 01/06/2020   Left wrist tendinitis 04/30/2018   Hand numbness 01/24/2018   Lumbar radiculopathy 12/23/2016   Plantar fasciitis of right foot 06/22/2016    Carpal tunnel syndrome of right wrist 03/07/2013   Myasthenia gravis without exacerbation (HCC) 12/31/2010   Alopecia 12/31/2010   Hypothyroidism 12/29/2010   Type 2 diabetes, diet controlled (HCC) 12/29/2010   Hyperlipidemia 12/29/2010   Essential hypertension 12/29/2010   Coronary atherosclerosis 12/29/2010   Osteoarthritis 12/29/2010   Past Medical History:  Diagnosis Date   Alopecia areata 10/2010 onset   Arthritis    Diabetes mellitus, type 2 (HCC)    no longer per pt   Dyslipidemia    Hyperlipidemia    on meds   Hypertension    on meds   Hypothyroid    on meds   Myasthenia gravis    Family History  Problem Relation Age of Onset   Colon cancer Neg Hx    Colon polyps Neg Hx    Esophageal cancer Neg Hx    Rectal cancer Neg Hx    Stomach cancer Neg Hx    Past Surgical History:  Procedure Laterality Date   ABDOMINAL HYSTERECTOMY  12/12/1988   CARPAL TUNNEL RELEASE Left    CARPAL TUNNEL RELEASE Right    COLONOSCOPY  2008   [facility]   TOTAL KNEE ARTHROPLASTY Right 07/14/2023   Procedure: RIGHT TOTAL KNEE ARTHROPLASTY;  Surgeon: Kathryne Hitch, MD;  Location: [facility];  Service: Orthopedics;  Laterality: Right;   Social History   Occupational History   Occupation: retired  Tobacco Use   Smoking status: Never   Smokeless tobacco: Never  Vaping Use   Vaping status: Never Used  Substance and Sexual Activity   Alcohol use: No   Drug use: Never   Sexual activity: Never

## 2023-11-24 NOTE — Addendum Note (Signed)
Addended by: Pincus Sanes on: 11/24/2023 04:51 PM   Modules accepted: Orders

## 2023-11-27 ENCOUNTER — Ambulatory Visit: Payer: Medicare HMO | Admitting: Physical Therapy

## 2023-11-27 ENCOUNTER — Encounter: Payer: Self-pay | Admitting: Physical Therapy

## 2023-11-27 DIAGNOSIS — M25561 Pain in right knee: Secondary | ICD-10-CM

## 2023-11-27 DIAGNOSIS — M6281 Muscle weakness (generalized): Secondary | ICD-10-CM | POA: Diagnosis not present

## 2023-11-27 DIAGNOSIS — R2689 Other abnormalities of gait and mobility: Secondary | ICD-10-CM

## 2023-11-27 NOTE — Therapy (Signed)
OUTPATIENT PHYSICAL THERAPY TREATMENT NOTE     Patient Name: Gloria [name] MRN: [id number] DOB:Oct 20, 1951, 72 y.o., female Today's Date: 11/27/2023        END OF SESSION:  PT End of Session - 11/27/23 1401     Visit Number 24    Number of Visits 25    Date for PT Re-Evaluation 12/04/23    Authorization Type [employer] MCR    Authorization Time Period 09/18/2023 - 11/04/2023; 11/06/23-12/09/23    Authorization - Visit Number 7    Authorization - Number of Visits 8    PT Start Time 1400    PT Stop Time 1452    PT Time Calculation (min) 52 min               Past Medical History:  Diagnosis Date   Alopecia areata 10/2010 onset   Arthritis    Diabetes mellitus, type 2 (HCC)    no longer per pt   Dyslipidemia    Hyperlipidemia    on meds   Hypertension    on meds   Hypothyroid    on meds   Myasthenia gravis    Past Surgical History:  Procedure Laterality Date   ABDOMINAL HYSTERECTOMY  12/12/1988   CARPAL TUNNEL RELEASE Left    CARPAL TUNNEL RELEASE Right    COLONOSCOPY  2008   [facility]   TOTAL KNEE ARTHROPLASTY Right 07/14/2023   Procedure: RIGHT TOTAL KNEE ARTHROPLASTY;  Surgeon: Kathryne Hitch, MD;  Location: [facility];  Service: Orthopedics;  Laterality: Right;   Patient Active Problem List   Diagnosis Date Noted   Urinary frequency 11/22/2023   Abdominal cramping 10/06/2023   Overactive bladder 09/06/2023   Status post total right knee replacement 07/14/23 07/14/2023   CKD (chronic kidney disease) stage 3, GFR 30-59 ml/min (HCC) 03/08/2023   Arthropathy of lumbar facet joint 11/14/2022   Chronic midline low back pain without sciatica 09/29/2021   Bradycardia 08/20/2020   Acute cervical radiculopathy 07/09/2020   Pruritus 02/06/2020   Raynaud's phenomenon 01/06/2020   Left wrist tendinitis 04/30/2018   Hand numbness 01/24/2018   Lumbar radiculopathy 12/23/2016   Plantar fasciitis of right foot 06/22/2016   Carpal tunnel syndrome of right wrist  03/07/2013   Myasthenia gravis without exacerbation (HCC) 12/31/2010   Alopecia 12/31/2010   Hypothyroidism 12/29/2010   Type 2 diabetes, diet controlled (HCC) 12/29/2010   Hyperlipidemia 12/29/2010   Essential hypertension 12/29/2010   Coronary atherosclerosis 12/29/2010   Osteoarthritis 12/29/2010    PCP: Pincus Sanes, MD  REFERRING PROVIDER: Kathryne Hitch, MD  REFERRING DIAG:  [phone] (ICD-10-CM) - Status post total right knee replacement M17.11 (ICD-10-CM) - Unilateral primary osteoarthritis, right knee  THERAPY DIAG:  Acute pain of right knee  Muscle weakness (generalized)  Other abnormalities of gait and mobility  Rationale for Evaluation and Treatment: Rehabilitation  ONSET DATE: 07/14/2023   SUBJECTIVE:  SUBJECTIVE STATEMENT: I am going to have an injection in my back. It was hurting really bad last night and today. The knee is hurting more today.    EVAL: Pt presents to PT s/p R TKA performed by Dr. Magnus Ivan on 07/14/2023. Had been seeing HHPT for last few weeks since after surgery. Has had difficulty with stair navigation and notes general increase in pain today. States she doesn't feel like she can do much due to pain, not sure why today is uncomfortable. Denies pain in calf, PT educated on s/s of DVT.   PAIN:  Are  you having pain?  Yes: NPRS scale: 6/10 Worst: 9-10/10 Pain location: Right knee Pain description: sore, post exercise Aggravating factors: walking, standing Relieving factors: rest, ice  PERTINENT HISTORY: HTN, Myasthenia Gravis, DM II, CKD  PRECAUTIONS: None  RED FLAGS: None   WEIGHT BEARING RESTRICTIONS: No  FALLS:  Has patient fallen in last 6 months? No  LIVING ENVIRONMENT: Lives with: lives alone Lives in: House/apartment Stairs: Yes: Internal: 14 steps; on right going up and External: 2 steps; none Has following equipment at home: Walker - 2 wheeled  OCCUPATION: Retired  PLOF: Independent  PATIENT GOALS:  improve right knee to be able to get back to walking and working out   OBJECTIVE:  PATIENT SURVEYS:  FOTO: 26% function; 54% predicted 09/21/23: 56% function 11/08/23: 59%  COGNITION: Overall cognitive status: Within functional limits for tasks assessed     SENSATION: Light touch: Impaired - lateral R LE  EDEMA:  DNT  POSTURE: rounded shoulders and forward head  PALPATION: TTP to distal R quad, patellar hypomobility  LOWER EXTREMITY ROM:  Active ROM Right eval Rt 08/17/23 Rt 08/22/23 RT 09/01/23 RT 09/13/23 RT 09/21/23 RT 09/28/23 RT  10/04/23 RT 10/13/23 Rt 10/18/23 Rt 10/23/23 RT 11/01/23 Right 11/08/23 Right 11/27/23  Knee flexion 72 sitting AA90d supine 92 AA 96 A/105 AA AA112 A 112 A 112 A 120 A 120 120 120 123 121 deg 125 deg  Knee extension 19 13d 10 d  10d A 6  A 4  A 3 After manual  5  Lacking 3     (Blank rows = not tested)  LOWER EXTREMITY MMT:  MMT Right eval Left eval Rt / Lt 10/23/2023 Right/Left 11/08/23  Knee flexion DNT 4/5 4 / 5 4+ / 5   Knee extension DNT 4/5 4 / 5 4 / 5    (Blank rows = not tested)  LOWER EXTREMITY SPECIAL TESTS:  DNT  FUNCTIONAL TESTS:  TUG: 41 seconds with FWW  09/21/2023: 19 seconds no AD  10/23/2023: 15 seconds without AD  11/08/23: 12.08sec no AD 30 Second Sit to Stand: 4 reps with B UE  10/20/2023: 8 reps without UE support 11/08/23: 9.5 reps gentle UE support from thighs, fatigue   GAIT: Distance walked: 74ft Assistive device utilized: Environmental consultant - 2 wheeled Level of assistance: Modified independence Comments: antalgic gait R, decreased knee ext R   TREATMENT: [facility] Adult PT Treatment:                                                DATE: 11/27/23 Therapeutic Exercise: Nustep LE only x 5 minutes  Knee ext 15 # bilat concentric, Right eccentric  Knee flex 25# bilat concentric , Right eccentric  SLS 34 sec SLS AIREX 34 AIREX  Slant board  SLR 2 x 10 with initial quad set  Side hip abduction right 10 x 2   Right hip flexor stretch Supine hamstring stretch   Therapeutic Activity: Up stairs without UE, down stairs with 1 UE Modalities: Ice pack right knee x 12 min    [facility] Adult PT Treatment:  DATE: 11/20/23 Therapeutic Exercise: Rec Bike L2 x 5 minutes  Leg press Omega single leg  35# 10 x 2 Knee ext 15# 15 x 2 Knee flex 25# 15 x 2 STS with 10# from bariatric chair  10 x 2  8 inch step stretch for knee  flexion and extension Slant board stretch   [facility] Adult PT Treatment:                                                DATE: 11/15/23 Therapeutic Exercise: Rec Bike L2 x 5 minutes Leg press Omega single leg  35# 10 x 2 Knee ext 15# 15 x 2 Knee flex 25# 15 x 2 4 inch forward step down x 4 - increased pain  4 inch lateral step downs x12 STS with 10# from bariatric chair  8 inch step stretch for knee  flexion and extension Slant board stretch  Modalities: Ice pack x 10 min right knee    [facility] Adult PT Treatment:                                                DATE: 11/13/23 Therapeutic Exercise: Rec Bike x 5 min STS bariatric chair x 10 Right SLS on foam with left hip abdct, ext  Right gastroc stretch  Right 6 inch lateral step down 10 x 2  Leg press Omega single leg   35# 10 x 2 Knee ext 15# 15 x 2 Knee flex 25# 12 x 2   Modalities: ice Pack x 10 min right knee     [facility] Adult PT Treatment:                                                DATE: 11/08/23 Therapeutic Exercise: Omega knee extension BIL 15# 2x12 Omega knee flexion BIL 20# 2x12 TKE into swiss ball 2x10 emphasis on quad contraction SL hip abduction x12 SL hip circle x5 CW/CCW   Therapeutic Activity: 30sec STS + education TUG + education MSK assessment + education FOTO + education Education/discussion re: progress with PT, symptom behavior as it affects activity tolerance, PT goals/POC   Modalities: Cold pack R knee w/ LE support, 10 min no adverse events  and good relief    [facility] Adult PT Treatment:                                                DATE: 11/01/23 Therapeutic Exercise: Rec Bike L2 x 5 minutes  Knee ext 15# 10 x 3  Knee flexion 20# 10 x 3  Leg press Omega single leg  25# 10 x 1, 35# 10 x 1 STS low chair x 10 6 inch lateral step down  Supine with strap h/s stretch Supine QS 5 sec x 10 8 inch step stretch  Manual Therapy: Hooklying knee A/P mobs Supine patellar mobs all directions Supine knee PROM flexion and extension Contract relax hamstrings   Modalities: Ice pack 10 minutes  [facility] Adult PT Treatment:                                                DATE: 11/01/23 Therapeutic Exercise: Rec Bike L1 x 5 minutes  Knee ext 15# 10 x 3  Knee flexion 20# 10 x 3  Leg press Omega 50# 10 x 3  Step up 6 inch runners , 10 x 2 right Supine QS Bridge S/L hip abduction 10 x 2 right  Manual Therapy: Hooklying knee A/P mobs Supine patellar mobs all directions Supine knee PROM flexion and extension Contract relax hamstrings   Modalities: Ice pack 10 minutes    [facility] Adult PT Treatment:                                                DATE: 10/23/23 Therapeutic Exercise: Recumbent bike L1 x 5 min while taking subjective Knee extension machine 15# 3 x 10 Knee flexion machine 20# 2 x 10 Leg press (BATCA) 45# 3 x 10 Forward 6" step up 2 x 10 each Standing hip abduction with red at knees 2 x 10 each SLR 3 x 10 Bridge 2 x 10 Modalities: Ice pack x 10 minutes post session   [facility] Adult PT Treatment:                                                DATE: 10/20/23 Therapeutic Exercise: Recumbent bike L2 x 3 min while taking subjective Knee extension machine 15# 3 x 10 Knee flexion machine 20# 2 x 10 Leg press (BATCA) 45# 3 x 10 Forward 6" step up 2 x 10 each Sidelying hip abduction 3 x 10 with right Bridge 2 x 10 Sit to stand 2 x 10 Standing TKE with green 2 x 10 Modalities: Ice pack x 10 minutes post  session      PATIENT EDUCATION:  Education details: rationale for interventions, HEP Person educated: Patient Education method: Programmer, multimedia, Demonstration Education comprehension: verbalized understanding and returned demonstration  HOME EXERCISE PROGRAM: Access Code: [email or web address]   ASSESSMENT: CLINICAL IMPRESSION: Pt's pain is elevated today, likely due to weather. Able to complete eccentric quad step down with light UE assist today without c/o increased pain. Pt continues to make gains in ROM, reaching 125 degrees flexion, and lacks minimal extension. She is on target to discharge at her next visit.         OBJECTIVE IMPAIRMENTS: Abnormal gait, decreased activity tolerance, decreased balance, decreased mobility, difficulty walking, decreased ROM, decreased strength, and pain.   ACTIVITY LIMITATIONS: sitting, standing, squatting, sleeping, stairs, transfers, bed mobility, and locomotion level  PARTICIPATION LIMITATIONS: driving, shopping, community activity, occupation, and yard work  PERSONAL FACTORS: Fitness and 3+ comorbidities: HTN, Myasthenia Gravis, DM II, CKD  are also affecting patient's functional outcome.    GOALS: Goals reviewed with patient? No  SHORT TERM GOALS: Target date: = LTG   Pt will be compliant and knowledgeable with initial HEP for improved comfort and carryover Baseline: initial HEP given  Goal status: MET  2.  Pt will self report right knee pain no greater  than 6/10 for improved comfort and functional ability Baseline: 10/10 at worst 09/01/23: can still reach 10/10 at night, better during day (5/10)  09/21/23 - 7/10 at night 10/23/2023: continues to report pain at worse 10/10 that wakes her up 11/01/23: wakes at night, not as painful, still 9-10/10 11/08/23: endorses up to 8/10 pain at times 11/20/23: 4/10 Goal status: MET  LONG TERM GOALS: Target date: 12/04/2023   Pt will improve FOTO function score to no less than 54% as proxy for  functional improvement Baseline: 26% function 09/21/23: 56% function 11/08/23: 59% Goal status: MET   2.  Pt will self report right knee pain no greater than 3/10 for improved comfort and functional ability Baseline: 10/10 at worst 10/23/2023: 10/10 pain at worst 11/01/23: 9-10/10 in mornings 11/08/23: up to 8/10 11/20/23: 4/10  Goal status: ONGOING  3.  Pt will increase 30 Second Sit to Stand rep count to no less than 8 reps for improved balance, strength, and functional mobility Baseline: 4 reps  09/21/23: 6 reps 10/20/2023: 8 reps without UE support 11/08/23: 9.5reps  Goal status: MET  4.  Pt will decrease TUG score to no greater than 18 seconds with least restrictive assistive device for improved safety and decreased fall risk Baseline: 41 seconds FWW 09/21/23: 19 seconds no AD 10/23/2023: 15 seconds without AD Goal status: MET  5.  Pt will improve right knee AROM to at least range of 3-110 degrees for improved comfort, mobility, and gait Baseline: see ROM chart 09/13/23:AA 10-112 09/21/23: A 6-112 10/23/2023: 5-120 11/08/23: see ROM chart above Goal status: ONGOING   PLAN: PT FREQUENCY: 2x/week  PT DURATION: 6 weeks  PLANNED INTERVENTIONS: 97164- PT Re-evaluation, 97110-Therapeutic exercises, 97530- Therapeutic activity, 97112- Neuromuscular re-education, 97535- Self Care, 81191- Manual therapy, 97116- Gait training, 97014- Electrical stimulation (unattended), 97016- Vasopneumatic device, Patient/Family education, Balance training, Dry Needling, Joint mobilization, Cryotherapy, and Moist heat  PLAN FOR NEXT SESSION: review and discharge    Jannette Spanner, PTA 11/27/23 3:30 PM Phone: [phone] Fax: [phone]     Referring diagnosis? E95.284 (ICD-10-CM) - Status post total right knee replacement  Treatment diagnosis? (if different than referring diagnosis) M25.561 (ICD-10-CM) - Acute pain of right knee What was this (referring dx) caused by? [x]   Surgery []  Fall []  Ongoing issue []  Arthritis []  Other: ____________  Laterality: [x]  Rt []  Lt []  Both  Check all possible CPT codes:  *CHOOSE 10 OR LESS*    See Planned Interventions listed in the Plan section of the Evaluation.

## 2023-12-04 ENCOUNTER — Encounter: Payer: Self-pay | Admitting: Physical Therapy

## 2023-12-04 ENCOUNTER — Ambulatory Visit: Payer: Medicare HMO | Admitting: Physical Therapy

## 2023-12-04 DIAGNOSIS — M25561 Pain in right knee: Secondary | ICD-10-CM | POA: Diagnosis not present

## 2023-12-04 DIAGNOSIS — M6281 Muscle weakness (generalized): Secondary | ICD-10-CM

## 2023-12-04 DIAGNOSIS — R2689 Other abnormalities of gait and mobility: Secondary | ICD-10-CM | POA: Diagnosis not present

## 2023-12-04 NOTE — Therapy (Addendum)
OUTPATIENT PHYSICAL THERAPY TREATMENT NOTE/DC     Patient Name: Gloria [name] MRN: [id number] DOB:[birth year], 72 y.o., female Today's Date: 12/04/2023        END OF SESSION:  PT End of Session - 12/04/23 1409     Visit Number 25    Number of Visits 25    Date for PT Re-Evaluation 12/04/23    Authorization Type [employer] MCR    Authorization Time Period 09/18/2023 - 11/04/2023; 11/06/23-12/09/23    Authorization - Visit Number 8    Authorization - Number of Visits 8    PT Start Time 0205    PT Stop Time 0245    PT Time Calculation (min) 40 min               Past Medical History:  Diagnosis Date   Alopecia areata 10/2010 onset   Arthritis    Diabetes mellitus, type 2 (HCC)    no longer per pt   Dyslipidemia    Hyperlipidemia    on meds   Hypertension    on meds   Hypothyroid    on meds   Myasthenia gravis    Past Surgical History:  Procedure Laterality Date   ABDOMINAL HYSTERECTOMY  12/12/1988   CARPAL TUNNEL RELEASE Left    CARPAL TUNNEL RELEASE Right    COLONOSCOPY  2008   [facility]   TOTAL KNEE ARTHROPLASTY Right 07/14/2023   Procedure: RIGHT TOTAL KNEE ARTHROPLASTY;  Surgeon: Kathryne Hitch, MD;  Location: [facility];  Service: Orthopedics;  Laterality: Right;   Patient Active Problem List   Diagnosis Date Noted   Urinary frequency 11/22/2023   Abdominal cramping 10/06/2023   Overactive bladder 09/06/2023   Status post total right knee replacement 07/14/23 07/14/2023   CKD (chronic kidney disease) stage 3, GFR 30-59 ml/min (HCC) 03/08/2023   Arthropathy of lumbar facet joint 11/14/2022   Chronic midline low back pain without sciatica 09/29/2021   Bradycardia 08/20/2020   Acute cervical radiculopathy 07/09/2020   Pruritus 02/06/2020   Raynaud's phenomenon 01/06/2020   Left wrist tendinitis 04/30/2018   Hand numbness 01/24/2018   Lumbar radiculopathy 12/23/2016   Plantar fasciitis of right foot 06/22/2016   Carpal tunnel syndrome of right  wrist 03/07/2013   Myasthenia gravis without exacerbation (HCC) 12/31/2010   Alopecia 12/31/2010   Hypothyroidism 12/29/2010   Type 2 diabetes, diet controlled (HCC) 12/29/2010   Hyperlipidemia 12/29/2010   Essential hypertension 12/29/2010   Coronary atherosclerosis 12/29/2010   Osteoarthritis 12/29/2010    PCP: Pincus Sanes, MD  REFERRING PROVIDER: Kathryne Hitch, MD  REFERRING DIAG:  [phone] (ICD-10-CM) - Status post total right knee replacement M17.11 (ICD-10-CM) - Unilateral primary osteoarthritis, right knee  THERAPY DIAG:  Acute pain of right knee  Muscle weakness (generalized)  Other abnormalities of gait and mobility  Rationale for Evaluation and Treatment: Rehabilitation  ONSET DATE: 07/14/2023   SUBJECTIVE:  SUBJECTIVE STATEMENT: Pain is a 4/10.    EVAL: Pt presents to PT s/p R TKA performed by Dr. Magnus Ivan on 07/14/2023. Had been seeing HHPT for last few weeks since after surgery. Has had difficulty with stair navigation and notes general increase in pain today. States she doesn't feel like she can do much due to pain, not sure why today is uncomfortable. Denies pain in calf, PT educated on s/s of DVT.   PAIN:  Are you having pain?  Yes: NPRS scale: 4/10 Worst: 9-10/10 Pain location: Right knee Pain description: sore, post exercise Aggravating factors:  walking, standing Relieving factors: rest, ice  PERTINENT HISTORY: HTN, Myasthenia Gravis, DM II, CKD  PRECAUTIONS: None  RED FLAGS: None   WEIGHT BEARING RESTRICTIONS: No  FALLS:  Has patient fallen in last 6 months? No  LIVING ENVIRONMENT: Lives with: lives alone Lives in: House/apartment Stairs: Yes: Internal: 14 steps; on right going up and External: 2 steps; none Has following equipment at home: Walker - 2 wheeled  OCCUPATION: Retired  PLOF: Independent  PATIENT GOALS: improve right knee to be able to get back to walking and working out   OBJECTIVE:  PATIENT SURVEYS:   FOTO: 26% function; 54% predicted 09/21/23: 56% function 11/08/23: 59%  COGNITION: Overall cognitive status: Within functional limits for tasks assessed     SENSATION: Light touch: Impaired - lateral R LE  EDEMA:  DNT  POSTURE: rounded shoulders and forward head  PALPATION: TTP to distal R quad, patellar hypomobility  LOWER EXTREMITY ROM:  Active ROM Right eval Rt 08/17/23 Rt 08/22/23 RT 09/01/23 RT 09/13/23 RT 09/21/23 RT 09/28/23 RT  10/04/23 RT 10/13/23 Rt 10/18/23 Rt 10/23/23 RT 11/01/23 Right 11/08/23 Right 11/27/23  Knee flexion 72 sitting AA90d supine 92 AA 96 A/105 AA AA112 A 112 A 112 A 120 A 120 120 120 123 121 deg 125 deg  Knee extension 19 13d 10 d  10d A 6  A 4  A 3 After manual  5  Lacking 3     (Blank rows = not tested)  LOWER EXTREMITY MMT:  MMT Right eval Left eval Rt / Lt 10/23/2023 Right/Left 11/08/23  Knee flexion DNT 4/5 4 / 5 4+ / 5   Knee extension DNT 4/5 4 / 5 4 / 5    (Blank rows = not tested)  LOWER EXTREMITY SPECIAL TESTS:  DNT  FUNCTIONAL TESTS:  TUG: 41 seconds with FWW  09/21/2023: 19 seconds no AD  10/23/2023: 15 seconds without AD  11/08/23: 12.08sec no AD 30 Second Sit to Stand: 4 reps with B UE  10/20/2023: 8 reps without UE support 11/08/23: 9.5 reps gentle UE support from thighs, fatigue   GAIT: Distance walked: 42ft Assistive device utilized: Environmental consultant - 2 wheeled Level of assistance: Modified independence Comments: antalgic gait R, decreased knee ext R   TREATMENT: [facility] Adult PT Treatment:                                                DATE: 12/04/23 Therapeutic Exercise: Nustep L5 x 5 minutes  Review of HEP Knee ext 10#  Knee flex 35# bilat  Leg press 45# single leg press      [facility] Adult PT Treatment:                                                DATE: 11/27/23 Therapeutic Exercise: Nustep LE only x 5 minutes  Knee ext 15 # bilat concentric, Right eccentric  Knee flex 25# bilat concentric , Right  eccentric  SLS 34 sec SLS AIREX 34 AIREX  Slant board  SLR 2 x 10 with initial quad set  Side hip abduction right 10 x 2  Right hip flexor stretch Supine hamstring stretch   Therapeutic Activity: Up stairs without UE, down stairs with 1  UE Modalities: Ice pack right knee x 12 min    [facility] Adult PT Treatment:                                                DATE: 11/20/23 Therapeutic Exercise: Rec Bike L2 x 5 minutes  Leg press Omega single leg  35# 10 x 2 Knee ext 15# 15 x 2 Knee flex 25# 15 x 2 STS with 10# from bariatric chair  10 x 2  8 inch step stretch for knee  flexion and extension Slant board stretch   [facility] Adult PT Treatment:                                                DATE: 11/15/23 Therapeutic Exercise: Rec Bike L2 x 5 minutes Leg press Omega single leg  35# 10 x 2 Knee ext 15# 15 x 2 Knee flex 25# 15 x 2 4 inch forward step down x 4 - increased pain  4 inch lateral step downs x12 STS with 10# from bariatric chair  8 inch step stretch for knee  flexion and extension Slant board stretch  Modalities: Ice pack x 10 min right knee    [facility] Adult PT Treatment:                                                DATE: 11/13/23 Therapeutic Exercise: Rec Bike x 5 min STS bariatric chair x 10 Right SLS on foam with left hip abdct, ext  Right gastroc stretch  Right 6 inch lateral step down 10 x 2  Leg press Omega single leg   35# 10 x 2 Knee ext 15# 15 x 2 Knee flex 25# 12 x 2   Modalities: ice Pack x 10 min right knee     [facility] Adult PT Treatment:                                                DATE: 11/08/23 Therapeutic Exercise: Omega knee extension BIL 15# 2x12 Omega knee flexion BIL 20# 2x12 TKE into swiss ball 2x10 emphasis on quad contraction SL hip abduction x12 SL hip circle x5 CW/CCW   Therapeutic Activity: 30sec STS + education TUG + education MSK assessment + education FOTO + education Education/discussion re: progress with PT, symptom  behavior as it affects activity tolerance, PT goals/POC   Modalities: Cold pack R knee w/ LE support, 10 min no adverse events and good relief    [facility] Adult PT Treatment:                                                DATE: 11/01/23 Therapeutic Exercise: Rec Bike L2 x 5 minutes  Knee ext 15# 10 x 3  Knee flexion 20# 10 x 3  Leg press Omega single leg  25# 10 x 1, 35# 10 x 1 STS low chair x 10 6 inch lateral step down  Supine with strap h/s stretch Supine QS 5 sec x 10 8 inch step stretch  Manual Therapy: Hooklying knee A/P mobs Supine patellar mobs all directions Supine knee PROM flexion and extension Contract relax hamstrings   Modalities: Ice pack 10 minutes        PATIENT EDUCATION:  Education details: rationale for interventions, HEP Person educated: Patient Education method: Explanation, Demonstration Education comprehension: verbalized understanding and returned demonstration  HOME EXERCISE PROGRAM: Access Code: [email or web address]   ASSESSMENT: CLINICAL IMPRESSION: Pt reports 4/10 pain on average. She has met most LTGs. Her ROM has greatly improved. She struggles with intermittent pain exacerbations however, overall this has also greatly improved. Today we reviewed her HEP and she was formally discharged.        OBJECTIVE IMPAIRMENTS: Abnormal gait, decreased activity tolerance, decreased balance, decreased mobility, difficulty walking, decreased ROM, decreased strength, and pain.   ACTIVITY LIMITATIONS: sitting, standing, squatting, sleeping, stairs, transfers, bed mobility, and locomotion level  PARTICIPATION LIMITATIONS: driving, shopping, community activity, occupation, and yard work  PERSONAL FACTORS: Fitness and 3+ comorbidities: HTN, Myasthenia Gravis, DM II, CKD  are also affecting patient's functional outcome.    GOALS: Goals reviewed with patient? No  SHORT TERM GOALS: Target date: = LTG   Pt will be compliant and knowledgeable with initial HEP  for improved comfort and carryover Baseline: initial HEP given  Goal status: MET  2.  Pt will self report right knee pain no greater than 6/10 for improved comfort and functional ability Baseline: 10/10 at worst 09/01/23: can still reach 10/10 at night, better during day (5/10)  09/21/23 - 7/10 at night 10/23/2023: continues to report pain at worse 10/10 that wakes her up 11/01/23: wakes at night, not as painful, still 9-10/10 11/08/23: endorses up to 8/10 pain at times 11/20/23: 4/10 Goal status: MET  LONG TERM GOALS: Target date: 12/04/2023   Pt will improve FOTO function score to no less than 54% as proxy for functional improvement Baseline: 26% function 09/21/23: 56% function 11/08/23: 59% Goal status: MET   2.  Pt will self report right knee pain no greater than 3/10 for improved comfort and functional ability Baseline: 10/10 at worst 10/23/2023: 10/10 pain at worst 11/01/23: 9-10/10 in mornings 11/08/23: up to 8/10 11/20/23: 4/10  Goal status:  NEARLY MET  3.  Pt will increase 30 Second Sit to Stand rep count to no less than 8 reps for improved balance, strength, and functional mobility Baseline: 4 reps  09/21/23: 6 reps 10/20/2023: 8 reps without UE support 11/08/23: 9.5reps  Goal status: MET  4.  Pt will decrease TUG score to no greater than 18 seconds with least restrictive assistive device for improved safety and decreased fall risk Baseline: 41 seconds FWW 09/21/23: 19 seconds no AD 10/23/2023: 15 seconds without AD Goal status: MET  5.  Pt will improve right knee AROM to at least range of 3-110 degrees for improved comfort, mobility, and gait Baseline: see ROM chart 09/13/23:AA 10-112 09/21/23: A 6-112 10/23/2023: 5-120 11/08/23: see ROM chart above 12/04/23: 3-125 best  Goal status: MET   PLAN: PT FREQUENCY: 2x/week  PT DURATION: 6 weeks  PLANNED INTERVENTIONS: 97164- PT Re-evaluation, 97110-Therapeutic exercises, 97530- Therapeutic activity, 97112-  Neuromuscular re-education, 97535- Self Care, 29528- Manual therapy, 97116- Gait training, 97014- Electrical stimulation (unattended), 97016- Vasopneumatic device, Patient/Family education, Balance training, Dry Needling, Joint mobilization, Cryotherapy, and Moist  heat  PLAN FOR NEXT SESSION: N/A DC to HEP   Jannette Spanner, PTA 12/04/23 2:25 PM Phone: [phone] Fax: [phone]    PHYSICAL THERAPY DISCHARGE SUMMARY  Visits from Start of Care: 25  Current functional level related to goals / functional outcomes: See clinical impression and PT goals    Remaining deficits: See clinical impression and PT goals    Education / Equipment: HEP, pt Ed   Patient agrees to discharge. Patient goals were met. Patient is being discharged due to meeting the stated rehab goals.   Allen Ralls MS, PT 12/08/23 3:26 PM

## 2023-12-18 ENCOUNTER — Telehealth: Payer: Self-pay | Admitting: Physical Medicine and Rehabilitation

## 2023-12-18 NOTE — Telephone Encounter (Signed)
Patient called. Returning a call to schedule with Dr. Newton.  ?

## 2023-12-25 ENCOUNTER — Ambulatory Visit (INDEPENDENT_AMBULATORY_CARE_PROVIDER_SITE_OTHER): Payer: No Typology Code available for payment source | Admitting: Orthopaedic Surgery

## 2023-12-25 ENCOUNTER — Other Ambulatory Visit (INDEPENDENT_AMBULATORY_CARE_PROVIDER_SITE_OTHER): Payer: No Typology Code available for payment source

## 2023-12-25 ENCOUNTER — Encounter: Payer: Self-pay | Admitting: Orthopaedic Surgery

## 2023-12-25 DIAGNOSIS — Z96651 Presence of right artificial knee joint: Secondary | ICD-10-CM

## 2023-12-25 NOTE — Progress Notes (Signed)
 The patient is now 6 months status post a right total knee replacement.  She feels some discomfort in the morning and some stiffness but overall she reports good range of motion and strength.  She is an active and young appearing 22.  Examination of her right knee shows minimal swelling.  The range of motion is entirely full and her knee is ligamentously stable.  Standing x-rays of her right knee shows a well-seated press-fit total knee arthroplasty with no evidence of loosening or complicating features.  All the praise definitely goes to her given the fact that she has pushed herself the pain and get her motion back completely.  She can try any kind of scar creams and lotions on her knee.  I would like to see her back for 1 more visit in 6 months at the 1 year standpoint with a final AP and lateral of her right knee.

## 2023-12-26 ENCOUNTER — Telehealth: Payer: Self-pay | Admitting: Internal Medicine

## 2023-12-26 NOTE — Telephone Encounter (Signed)
 Copied from CRM [id number]. Topic: Referral - Request for Referral >> Dec 26, 2023 10:59 AM Maisie BROCKS wrote: Did the patient discuss referral with their provider in the last year? Yes Pt did not want to schedule an appt because she already discussed with PCP  Appointment offered? Yes  Type of order/referral and detailed reason for visit: Dermatologist Preference of office, provider, location: Dr. Delon Lenis  If referral order, have you been seen by this specialty before? No   Can we respond through MyChart? No She asked for callback.

## 2023-12-27 NOTE — Telephone Encounter (Signed)
 Message left for patient today to return call to clinic to provide reason for referral to dermatology so this can be noted on referral.

## 2023-12-27 NOTE — Telephone Encounter (Signed)
 Copied from CRM [id number]. Topic: Referral - Question >> Dec 27, 2023 11:32 AM Marlan Silva wrote: Reason for CRM: Patient is requesting a call back to inquire about getting a referral for Dermatology. Patient called prior and still has not heard anything back yet.

## 2023-12-28 NOTE — Telephone Encounter (Signed)
Copied from CRM [id number]. Topic: Referral - Question >> Dec 28, 2023 10:15 AM Corin V wrote: Reason for CRM: Patient returned call for [facility] regarding the dermatology referral. Agent advised that she had just needed to know the reason for the dermatology referral so that they could include that on the referral. Patient declined to provide that information to agent and stated that Albin Felling would need to speak with her regarding that information and requested a call back.

## 2023-12-29 NOTE — Telephone Encounter (Signed)
Copied from CRM [id number]. Topic: Referral - Status >> Dec 29, 2023 11:49 AM Elizebeth Brooking wrote: Reason for CRM: Patient called in regarding dermatology due to hair issues such as hair is thinning and scalp is itching, she stated that she would like for Albin Felling to give her a call back regarding sending the dermatology referral over her callback number is [phone]

## 2024-01-01 ENCOUNTER — Other Ambulatory Visit: Payer: Self-pay

## 2024-01-01 ENCOUNTER — Ambulatory Visit (INDEPENDENT_AMBULATORY_CARE_PROVIDER_SITE_OTHER): Payer: No Typology Code available for payment source | Admitting: Physical Medicine and Rehabilitation

## 2024-01-01 DIAGNOSIS — M47816 Spondylosis without myelopathy or radiculopathy, lumbar region: Secondary | ICD-10-CM | POA: Diagnosis not present

## 2024-01-01 MED ORDER — METHYLPREDNISOLONE ACETATE 40 MG/ML IJ SUSP
40.0000 mg | Freq: Once | INTRAMUSCULAR | Status: AC
  Administered 2024-01-01: 40 mg

## 2024-01-01 NOTE — Patient Instructions (Signed)

## 2024-01-01 NOTE — Progress Notes (Signed)
Gloria [name] - 73 y.o. female MRN [id number]  Date of birth: 10-27-1951  Office Visit Note: Visit Date: 01/01/2024 PCP: Pincus Sanes, MD Referred by: Pincus Sanes, MD  Subjective: Chief Complaint  Patient presents with   Lower Back - Pain   HPI:  Gloria [name] is a 73 y.o. female who comes in today at the request of Ellin Goodie, FNP for planned Bilateral  L4-5 Lumbar facet/medial branch block with fluoroscopic guidance.  The patient has failed conservative care including home exercise, medications, time and activity modification.  This injection will be diagnostic and hopefully therapeutic.  Please see requesting physician notes for further details and justification.  Exam has shown concordant pain with facet joint loading.   ROS Otherwise per HPI.  Assessment & Plan: Visit Diagnoses:    ICD-10-CM   1. Spondylosis without myelopathy or radiculopathy, lumbar region  M47.816 methylPREDNISolone acetate (DEPO-MEDROL) injection 40 mg    XR C-ARM NO REPORT    Facet Injection      Plan: No additional findings.   Meds & Orders:  Meds ordered this encounter  Medications   methylPREDNISolone acetate (DEPO-MEDROL) injection 40 mg    Orders Placed This Encounter  Procedures   Facet Injection   XR C-ARM NO REPORT    Follow-up: Return if symptoms worsen or fail to improve.   Procedures: No procedures performed  Lumbar Facet Joint Intra-Articular Injection(s) with Fluoroscopic Guidance  Patient: Gloria [name]      Date of Birth: 04-17-1951 MRN: [id number] PCP: Pincus Sanes, MD      Visit Date: 01/01/2024   Universal Protocol:    Date/Time: 01/01/2024  Consent Given By: the patient  Position: PRONE   Additional Comments: Vital signs were monitored before and after the procedure. Patient was prepped and draped in the usual sterile fashion. The correct patient, procedure, and site was verified.   Injection Procedure Details:  Procedure Site One Meds  Administered:  Meds ordered this encounter  Medications   methylPREDNISolone acetate (DEPO-MEDROL) injection 40 mg     Laterality: Bilateral  Location/Site:  L4-L5  Needle size: 22 guage  Needle type: Spinal  Needle Placement: Articular  Findings:  -Comments: Excellent flow of contrast producing a partial arthrogram.  Procedure Details: The fluoroscope beam is vertically oriented in AP, and the inferior recess is visualized beneath the lower pole of the inferior apophyseal process, which represents the target point for needle insertion. When direct visualization is difficult the target point is located at the medial projection of the vertebral pedicle. The region overlying each aforementioned target is locally anesthetized with a 1 to 2 ml. volume of 1% Lidocaine without Epinephrine.   The spinal needle was inserted into each of the above mentioned facet joints using biplanar fluoroscopic guidance. A 0.25 to 0.5 ml. volume of Isovue-250 was injected and a partial facet joint arthrogram was obtained. A single spot film was obtained of the resulting arthrogram.    One to 1.25 ml of the steroid/anesthetic solution was then injected into each of the facet joints noted above.   Additional Comments:  No complications occurred Dressing: 2 x 2 sterile gauze and Band-Aid    Post-procedure details: Patient was observed during the procedure. Post-procedure instructions were reviewed.  Patient left the clinic in stable condition.    Clinical History: EXAM: MRI LUMBAR SPINE WITHOUT CONTRAST   TECHNIQUE: Multiplanar, multisequence MR imaging of the lumbar spine was performed. No intravenous contrast was administered.  COMPARISON:  Radiography 04/30/2018   FINDINGS: Segmentation:  5 lumbar type vertebrae   Alignment:  Grade 1 anterolisthesis at L4-5   Vertebrae: Marrow edema about the left L4-5 facet. No acute fracture, discitis, or aggressive bone lesion   Conus medullaris  and cauda equina: Conus extends to the L2 level. Conus and cauda equina appear normal.   Paraspinal and other soft tissues: Negative   Disc levels:   T12- L1: Unremarkable.   L1-L2: Disc narrowing and bulging. Mild facet spurring. Mild bilateral foraminal narrowing   L2-L3: Unremarkable.   L3-L4: Mild disc narrowing and bulging. Degenerative facet spurring and ligamentum flavum thickening. No neural impingement   L4-L5: Facet osteoarthritis which is advanced with bulky bony and ligamentous hypertrophy, anterolisthesis, and active facet arthritis on the left. The disc is narrowed and bulging and there is advanced spinal stenosis and biforaminal L4 root flattening.   L5-S1:Disc narrowing and bulging with endplate and facet spurring. High-grade Biforaminal impingement. Patent spinal canal.   IMPRESSION: 1. 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 Combined with disc bulging there is compressive spinal and biforaminal stenosis. 2. L5-S1 degenerative biforaminal impingement. 3. L1-2 noncompressive disc bulging.     Electronically Signed   By: Marnee Spring M.D.   On: 09/16/2020 09:36     Objective:  VS:  HT:    WT:   BMI:     BP:   HR: bpm  TEMP: ( )  RESP:  Physical Exam Vitals and nursing note reviewed.  Constitutional:      General: She is not in acute distress.    Appearance: Normal appearance. She is well-developed. She is not ill-appearing.  HENT:     Head: Normocephalic and atraumatic.     Right Ear: External ear normal.     Left Ear: External ear normal.  Eyes:     Extraocular Movements: Extraocular movements intact.     Conjunctiva/sclera: Conjunctivae normal.     Pupils: Pupils are equal, round, and reactive to light.  Cardiovascular:     Rate and Rhythm: Normal rate.     Pulses: Normal pulses.  Pulmonary:     Effort: Pulmonary effort is normal. No respiratory distress.  Abdominal:     General: There is  no distension.     Palpations: Abdomen is soft.  Musculoskeletal:        General: Tenderness present.     Cervical back: Neck supple.     Right lower leg: No edema.     Left lower leg: No edema.     Comments: Patient has good distal strength with no pain over the greater trochanters.  No clonus or focal weakness.  Skin:    General: Skin is warm and dry.     Findings: No erythema, lesion or rash.  Neurological:     General: No focal deficit present.     Mental Status: She is alert and oriented to person, place, and time.     Sensory: No sensory deficit.     Motor: No weakness or abnormal muscle tone.     Coordination: Coordination normal.     Gait: Gait normal.  Psychiatric:        Mood and Affect: Mood normal.        Behavior: Behavior normal.      Imaging: No results found.

## 2024-01-01 NOTE — Progress Notes (Signed)
Functional Pain Scale - descriptive words and definitions  Mild (2)   Noticeable when not distracted/no impact on ADL's/sleep only slightly affected and able to   use both passive and active distraction for comfort. Mild range order  Average Pain 3 Pain is more center of her back and is on both R & L side does not travel down her legs at all.   +Driver, -BT, -Dye Allergies.

## 2024-01-02 ENCOUNTER — Other Ambulatory Visit: Payer: Self-pay

## 2024-01-02 ENCOUNTER — Telehealth: Payer: Self-pay | Admitting: Radiology

## 2024-01-02 DIAGNOSIS — L659 Nonscarring hair loss, unspecified: Secondary | ICD-10-CM

## 2024-01-02 DIAGNOSIS — L299 Pruritus, unspecified: Secondary | ICD-10-CM

## 2024-01-02 NOTE — Telephone Encounter (Signed)
Please return call. [phone]  Patient called stating she had injections with Dr. Alvester Morin yesterday & this morning she has blisters on her back. She has used ice.   Patient wants to know if she needs to come in to be seen or what can she do.

## 2024-01-02 NOTE — Telephone Encounter (Signed)
Spoke with patient, derm referral has been placed

## 2024-01-03 ENCOUNTER — Other Ambulatory Visit: Payer: Self-pay | Admitting: Family Medicine

## 2024-01-03 DIAGNOSIS — T24212A Burn of second degree of left thigh, initial encounter: Secondary | ICD-10-CM

## 2024-01-04 ENCOUNTER — Ambulatory Visit: Payer: No Typology Code available for payment source | Admitting: Physical Medicine and Rehabilitation

## 2024-01-04 ENCOUNTER — Encounter: Payer: Self-pay | Admitting: Physical Medicine and Rehabilitation

## 2024-01-04 DIAGNOSIS — R21 Rash and other nonspecific skin eruption: Secondary | ICD-10-CM

## 2024-01-04 DIAGNOSIS — L231 Allergic contact dermatitis due to adhesives: Secondary | ICD-10-CM

## 2024-01-04 NOTE — Progress Notes (Unsigned)
Gloria [name] - 73 y.o. female MRN [id number]  Date of birth: 11-25-1951  Office Visit Note: Visit Date: 01/04/2024 PCP: Pincus Sanes, MD Referred by: Pincus Sanes, MD  Subjective: Chief Complaint  Patient presents with   Lower Back - Pain   HPI: Gloria [name] is a 73 y.o. female who comes in today for evaluation of skin irritation to lower back post bilateral L4-L5 facet joint injections performed in our office on 12/31/2022. Patient states she noticed fluid filled blisters to lower back about 2 hours post injections. She reports itching and burning pain, has tried mupirocin cream and ice at home with some relief of pain. She came in today for evaluation, she is concerned this could be an infection. Patient has undergone multiple sets of facet joint injections in our office over the year, no reports of similar issues.  Of note, her lower back pain feels much better post injections.      Review of Systems  Skin:  Positive for itching and rash.  All other systems reviewed and are negative.  Otherwise per HPI.  Assessment & Plan: Visit Diagnoses:    ICD-10-CM   1. Rash and other nonspecific skin eruption  R21     2. Contact dermatitis due to adhesives, unspecified contact dermatitis type  L23.1        Plan: Findings:  Acute skin irritation post bilateral L4-L5 facet joint injections performed on 01/01/2024. Patient continues to experience itching and irritation to bilateral lower back. This does appear to be contact dermatitis. There are superficial wounds to bilateral lower back, no bleeding, no drainage noted. Left sided wound appears to be larger and irritated today as epidermis was lost when blister erupted. Sites do not appear to be infected. We discussed treatment plan in detail today, I feel this is likely from patient applying ice packs at home, almost looks like a frost bite injury. The wounds are not directly at sites of injections. I encouraged patient to keep wounds  clean and dry, recommend dressing change and application of mupirocin cream twice daily. I feel confident wounds will heal well over time. I would like to see her back in the office next Wednesday for re-evaluation. I encouraged patient to reach out to [place] with any concerns. As of note patient does have multiple drug intolerances.  Dr. Alvester Morin was bedside and co-examined patient.    Meds & Orders: No orders of the defined types were placed in this encounter.  No orders of the defined types were placed in this encounter.   Follow-up: Return if symptoms worsen or fail to improve.   Procedures: No procedures performed      Clinical History: EXAM: MRI LUMBAR SPINE WITHOUT CONTRAST   TECHNIQUE: Multiplanar, multisequence MR imaging of the lumbar spine was performed. No intravenous contrast was administered.   COMPARISON:  Radiography 04/30/2018   FINDINGS: Segmentation:  5 lumbar type vertebrae   Alignment:  Grade 1 anterolisthesis at L4-5   Vertebrae: Marrow edema about the left L4-5 facet. No acute fracture, discitis, or aggressive bone lesion   Conus medullaris and cauda equina: Conus extends to the L2 level. Conus and cauda equina appear normal.   Paraspinal and other soft tissues: Negative   Disc levels:   T12- L1: Unremarkable.   L1-L2: Disc narrowing and bulging. Mild facet spurring. Mild bilateral foraminal narrowing   L2-L3: Unremarkable.   L3-L4: Mild disc narrowing and bulging. Degenerative facet spurring and ligamentum flavum thickening. No neural  impingement   L4-L5: Facet osteoarthritis which is advanced with bulky bony and ligamentous hypertrophy, anterolisthesis, and active facet arthritis on the left. The disc is narrowed and bulging and there is advanced spinal stenosis and biforaminal L4 root flattening.   L5-S1:Disc narrowing and bulging with endplate and facet spurring. High-grade Biforaminal impingement. Patent spinal canal.   IMPRESSION: 1.  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 Combined with disc bulging there is compressive spinal and biforaminal stenosis. 2. L5-S1 degenerative biforaminal impingement. 3. L1-2 noncompressive disc bulging.     Electronically Signed   By: Marnee Spring M.D.   On: 09/16/2020 09:36   She reports that she has never smoked. She has never used smokeless tobacco.  Recent Labs    03/08/23 1214 07/05/23 1156 09/06/23 1636  HGBA1C 6.1 6.1* 5.7    Objective:  VS:  HT:    WT:   BMI:     BP:   HR: bpm  TEMP: ( )  RESP:  Physical Exam Vitals and nursing note reviewed.  Skin:    General: Skin is warm.     Capillary Refill: Capillary refill takes less than 2 seconds.     Findings: Lesion present.          Comments: Superficial wounds to bilateral lower back, no drainage, no signs of infection noted.      Ortho Exam  Imaging: No results found.  Past Medical/Family/Surgical/Social History: Medications & Allergies reviewed per EMR, new medications updated. Patient Active Problem List   Diagnosis Date Noted   Urinary frequency 11/22/2023   Abdominal cramping 10/06/2023   Overactive bladder 09/06/2023   Status post total right knee replacement 07/14/23 07/14/2023   CKD (chronic kidney disease) stage 3, GFR 30-59 ml/min (HCC) 03/08/2023   Arthropathy of lumbar facet joint 11/14/2022   Chronic midline low back pain without sciatica 09/29/2021   Bradycardia 08/20/2020   Acute cervical radiculopathy 07/09/2020   Pruritus 02/06/2020   Raynaud's phenomenon 01/06/2020   Left wrist tendinitis 04/30/2018   Hand numbness 01/24/2018   Lumbar radiculopathy 12/23/2016   Plantar fasciitis of right foot 06/22/2016   Carpal tunnel syndrome of right wrist 03/07/2013   Myasthenia gravis without exacerbation (HCC) 12/31/2010   Alopecia 12/31/2010   Hypothyroidism 12/29/2010   Type 2 diabetes, diet controlled (HCC) 12/29/2010   Hyperlipidemia  12/29/2010   Essential hypertension 12/29/2010   Coronary atherosclerosis 12/29/2010   Osteoarthritis 12/29/2010   Past Medical History:  Diagnosis Date   Alopecia areata 10/2010 onset   Arthritis    Diabetes mellitus, type 2 (HCC)    no longer per pt   Dyslipidemia    Hyperlipidemia    on meds   Hypertension    on meds   Hypothyroid    on meds   Myasthenia gravis    Family History  Problem Relation Age of Onset   Colon cancer Neg Hx    Colon polyps Neg Hx    Esophageal cancer Neg Hx    Rectal cancer Neg Hx    Stomach cancer Neg Hx    Past Surgical History:  Procedure Laterality Date   ABDOMINAL HYSTERECTOMY  12/12/1988   CARPAL TUNNEL RELEASE Left    CARPAL TUNNEL RELEASE Right    COLONOSCOPY  2008   [facility]   TOTAL KNEE ARTHROPLASTY Right 07/14/2023   Procedure: RIGHT TOTAL KNEE ARTHROPLASTY;  Surgeon: Kathryne Hitch, MD;  Location: [facility];  Service: Orthopedics;  Laterality: Right;   Social  History   Occupational History   Occupation: retired  Tobacco Use   Smoking status: Never   Smokeless tobacco: Never  Vaping Use   Vaping status: Never Used  Substance and Sexual Activity   Alcohol use: No   Drug use: Never   Sexual activity: Never

## 2024-01-05 ENCOUNTER — Ambulatory Visit (INDEPENDENT_AMBULATORY_CARE_PROVIDER_SITE_OTHER): Payer: Medicaid Other

## 2024-01-05 VITALS — BP 140/80 | HR 48 | Ht 65.0 in | Wt 154.2 lb

## 2024-01-05 DIAGNOSIS — Z0000 Encounter for general adult medical examination without abnormal findings: Secondary | ICD-10-CM | POA: Diagnosis not present

## 2024-01-05 NOTE — Progress Notes (Signed)
Subjective:   Gloria [name] is a 73 y.o. female who presents for Medicare Annual (Subsequent) preventive examination.  Visit Complete: In person   Cardiac Risk Factors include: advanced age (>71men, >82 women);hypertension;diabetes mellitus;Other (see comment);dyslipidemia, Risk factor comments: Coronary atherosclerosis, CKD     Objective:    Today's Vitals   01/05/24 1344  BP: (!) 140/80  Pulse: (!) 48  SpO2: 100%  Weight: 154 lb 3.2 oz (69.9 kg)  Height: 5\' 5"  (1.651 m)   Body mass index is 25.66 kg/m.     01/05/2024    1:58 PM 07/31/2023    2:41 PM 07/14/2023    6:00 PM 07/05/2023   11:21 AM 11/14/2022   11:22 AM 11/08/2021    3:28 PM 01/12/2021    1:35 PM  Advanced Directives  Does Patient Have a Medical Advance Directive? Yes Yes Yes Yes Yes Yes Yes  Type of Estate agent of [place];Living will Living will Living will Living will Living will Healthcare Power of Attorney;Living will Living will  Does patient want to make changes to medical advance directive?   No - Patient declined  No - Patient declined  No - Patient declined  Copy of Healthcare Power of Attorney in Chart? No - copy requested     No - copy requested     Current Medications (verified) Outpatient Encounter Medications as of 01/05/2024  Medication Sig   acetaminophen (TYLENOL) 500 MG tablet Take 500 mg by mouth every 6 (six) hours as needed for moderate pain or mild pain.   amLODipine (NORVASC) 5 MG tablet TAKE 1 TABLET(5 MG) BY MOUTH DAILY   Ascorbic Acid (VITAMIN C PO) Take 1,000 mg by mouth daily at 6 (six) AM.   atorvastatin (LIPITOR) 10 MG tablet TAKE 1 TABLET BY MOUTH DAILY AT 6 PM   BIOTIN 5000 PO Take 10,000 mcg by mouth daily. Hair, skin, Nails   Blood Pressure Monitoring (ADULT BLOOD PRESSURE CUFF LG) KIT 1 kit by Does not apply route daily.   Blood Pressure Monitoring (BLOOD PRESSURE MONITOR AUTOMAT) DEVI 1 kit by Does not apply route as directed. Please dispense blood  pressure kit covered by insurance   CALCIUM-MAGNESIUM-ZINC PO Take 1 tablet by mouth daily at 6 (six) AM.   Cyanocobalamin (VITAMIN B 12 PO) Take 3,000 mcg by mouth daily at 6 (six) AM.   diclofenac Sodium (VOLTAREN) 1 % GEL Apply 4 g topically 4 (four) times daily. (Patient taking differently: Apply 4 g topically daily as needed (pain).)   Ferrous Sulfate Dried (SLOW RELEASE IRON) 45 MG TBCR Take 1 tablet by mouth daily.   levothyroxine (SYNTHROID) 100 MCG tablet TAKE 1 TABLET EVERY DAY 30 MINS PRIOR TO BREAKFAST 6 DAYS A WEEK AND 1/2 TABLET 1 DAY A WEEK   Multiple Vitamins-Minerals (WOMENS 50+ MULTI VITAMIN PO) Take 1 tablet by mouth daily at 6 (six) AM.   mupirocin ointment (BACTROBAN) 2 % Apply to affected area daily and as needed.   Omega-3 1000 MG CAPS Take 1,000 mg by mouth daily.   Polyethyl Glycol-Propyl Glycol (SYSTANE) 0.4-0.3 % SOLN Place 1 drop into both eyes daily as needed (dry eyes).   telmisartan (MICARDIS) 80 MG tablet TAKE 1 TABLET(80 MG) BY MOUTH DAILY   triamcinolone cream (KENALOG) 0.1 % APPLY TOPICALLY TO THE AFFECTED AREA TWICE DAILY   aspirin 81 MG chewable tablet Chew 1 tablet (81 mg total) by mouth 2 (two) times daily. (Patient not taking: Reported on 01/05/2024)  Blood Pressure Monitor DEVI Use to check blood pressure once daily   celecoxib (CELEBREX) 200 MG capsule TAKE 1 CAPSULE(200 MG) BY MOUTH TWICE DAILY BETWEEN MEALS AS NEEDED (Patient not taking: Reported on 01/05/2024)   Cholecalciferol (VITAMIN D) 50 MCG (2000 UT) CAPS Take 2,000 Units by mouth daily. (Patient not taking: Reported on 01/05/2024)   diazepam (VALIUM) 5 MG tablet Take one tablet by mouth with food one hour prior to procedure. May repeat 30 minutes prior if needed. (Patient not taking: Reported on 01/05/2024)   methocarbamol (ROBAXIN) 500 MG tablet Take 1 tablet (500 mg total) by mouth every 6 (six) hours as needed. (Patient not taking: Reported on 01/05/2024)   mirabegron ER (MYRBETRIQ) 25 MG TB24  tablet Take 1 tablet (25 mg total) by mouth daily. (Patient not taking: Reported on 01/05/2024)   nitrofurantoin, macrocrystal-monohydrate, (MACROBID) 100 MG capsule Take 1 capsule (100 mg total) by mouth 2 (two) times daily. (Patient not taking: Reported on 01/05/2024)   oxyCODONE (OXY IR/ROXICODONE) 5 MG immediate release tablet Take 1 tablet (5 mg total) by mouth 2 (two) times daily as needed for moderate pain (pain score 4-6) (pain score 4-6). (Patient not taking: Reported on 01/05/2024)   tiZANidine (ZANAFLEX) 4 MG tablet Take 1 tablet (4 mg total) by mouth every 8 (eight) hours as needed for muscle spasms. (Patient not taking: Reported on 01/05/2024)   No facility-administered encounter medications on file as of 01/05/2024.    Allergies (verified) Acetaminophen, Beta adrenergic blockers, Clindamycin, Hydrocodone, Hydrocodone-acetaminophen, Lincomycin, Lithium, Morphine, Neomycin, Quinidine, Quinine, Timolol, and Vicodin [hydrocodone-acetaminophen]   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   Procedure: RIGHT TOTAL KNEE ARTHROPLASTY;  Surgeon: Kathryne Hitch, MD;  Location: [facility];  Service: Orthopedics;  Laterality: Right;   Family History  Problem Relation Age of Onset   Colon cancer Neg Hx    Colon polyps Neg Hx    Esophageal cancer Neg Hx    Rectal cancer Neg Hx    Stomach cancer Neg Hx    Social History   Socioeconomic History   Marital status: Divorced    Spouse name: Not on file   Number of children: 3   Years of  education: Not on file   Highest education level: Not on file  Occupational History   Occupation: retired  Tobacco Use   Smoking status: Never   Smokeless tobacco: Never  Vaping Use   Vaping status: Never Used  Substance and Sexual Activity   Alcohol use: No   Drug use: Never   Sexual activity: Never  Other Topics Concern   Not on file  Social History Narrative   Lives alone-2025   Social Drivers of Health   Financial Resource Strain: Medium Risk (01/05/2024)   Overall Financial Resource Strain (CARDIA)    Difficulty of Paying Living Expenses: Somewhat hard  Food Insecurity: Food Insecurity Present (01/05/2024)   Hunger  Vital Sign    Worried About Programme researcher, broadcasting/film/video in the Last Year: Sometimes true    Ran Out of Food in the Last Year: Sometimes true  Transportation Needs: No Transportation Needs (01/05/2024)   PRAPARE - Administrator, Civil Service (Medical): No    Lack of Transportation (Non-Medical): No  Physical Activity: Insufficiently Active (01/05/2024)   Exercise Vital Sign    Days of Exercise per Week: 3 days    Minutes of Exercise per Session: 30 min  Stress: No Stress Concern Present (01/05/2024)   [employer] of Occupational Health - Occupational Stress Questionnaire    Feeling of Stress : Not at all  Social Connections: Moderately Integrated (01/05/2024)   Social Connection and Isolation Panel [NHANES]    Frequency of Communication with Friends and Family: More than three times a week    Frequency of Social Gatherings with Friends and Family: Once a week    Attends Religious Services: More than 4 times per year    Active Member of [employer] or Organizations: Yes    Attends Banker Meetings: Never    Marital Status: Divorced    Tobacco Counseling Counseling given: Not Answered   Clinical Intake:  Pre-visit preparation completed: Yes  Pain : No/denies pain     BMI - recorded: 25.66 Nutritional Status: BMI 25 -29  Overweight Nutritional Risks: None Diabetes: Yes CBG done?: No Did pt. bring in CBG monitor from home?: No  How often do you need to have someone help you when you read instructions, pamphlets, or other written materials from your doctor or pharmacy?: 1 - Never  Interpreter Needed?: No  Information entered by :: Orbin Mayeux, RMA   Activities of Daily Living    01/05/2024   12:38 PM 07/14/2023    6:00 PM  In your present state of health, do you have any difficulty performing the following activities:  Hearing? 0 0  Vision? 0 0  Difficulty concentrating or making decisions? 0 0  Walking or climbing stairs?  0  Dressing or bathing? 0 0  Doing errands, shopping? 0 0  Preparing Food and eating ? N   Using the Toilet? N   In the past six months, have you accidently leaked urine? N   Do you have problems with loss of bowel control? N   Managing your Medications? N   Managing your Finances? N   Housekeeping or managing your Housekeeping? N     Patient Care Team: Pincus Sanes, MD as PCP - General (Internal Medicine) Caress, Fayrene Fearing, MD (Neurology) Helane Gunther, DPM (Podiatry) Janalyn Harder, MD (Inactive) (Dermatology) Vida Rigger, MD (Gastroenterology) Sallye Lat, MD as Consulting Physician (Ophthalmology) Erlene Quan, Vinnie Level, [facility] (Inactive) (Pharmacist)  Indicate any recent Medical Services you may have received from other than [facility] providers in the past year (date may be approximate).     Assessment:   This is a routine wellness examination for Gloria [name].  Hearing/Vision screen Hearing Screening - Comments:: Denies hearing difficulties   Vision Screening - Comments:: Denies vision issues.    Goals Addressed               This Visit's Progress     Patient Stated (pt-stated)        Working toward getting back health wise before knee replacement       Depression Screen    01/05/2024    2:01 PM 07/03/2023   11:21 AM 03/08/2023   11:34 AM 11/14/2022  11:21 AM 09/07/2022    4:33 PM 03/17/2022    2:51 PM 11/08/2021    3:34 PM  PHQ 2/9 Scores  PHQ - 2 Score 0 0 0 0 0 0 0  PHQ- 9 Score 0  0        Fall Risk    01/05/2024    1:58 PM 07/03/2023   11:21 AM 03/08/2023   11:34 AM 11/14/2022   11:24 AM 09/07/2022    4:33 PM  Fall Risk   Falls in the past year? 0 0 0 0 0  Number falls in past yr: 0 0 0 0 0  Injury with Fall? 0 0 0 0 0  Risk for fall due to : No Fall Risks No Fall Risks No Fall Risks No Fall Risks No Fall Risks  Follow up Falls prevention discussed;Falls evaluation completed Falls evaluation completed Falls evaluation completed Falls evaluation completed Falls evaluation completed    MEDICARE RISK AT HOME: Medicare Risk at Home Any stairs in or around the home?: Yes If so, are there any without handrails?: Yes Home free of loose throw rugs in walkways, pet beds, electrical cords, etc?: Yes Adequate lighting in your home to reduce risk of falls?: Yes Life alert?: Yes Use of a cane, walker or w/c?: No Grab bars in the bathroom?: No Shower chair or bench in shower?: Yes Elevated toilet seat or a handicapped toilet?: Yes  TIMED UP AND GO:  Was the test performed?  Yes  Length of time to ambulate 10 feet: 15 sec Gait steady and fast without use of assistive device    Cognitive Function:        01/05/2024   12:42 PM 11/14/2022   11:25 AM  6CIT Screen  What Year? 0 points 0 points  What month? 0 points 0 points  What time? 0 points 0 points  Count back from 20 0 points 0 points  Months in reverse 0 points 0 points  Repeat phrase 0 points 0 points  Total Score 0 points 0 points    Immunizations Immunization History  Administered Date(s) Administered   Influenza Split 09/05/2012   Influenza,inj,Quad PF,6+ Mos 11/28/2013, 10/17/2014   PFIZER Comirnaty(Gray Top)Covid-19 Tri-Sucrose Vaccine 11/01/2021   PFIZER(Purple Top)SARS-COV-2 Vaccination 01/23/2020, 02/17/2020, 09/28/2020, 03/30/2021   Pfizer Covid-19  Vaccine Bivalent Booster 23yrs & up 11/12/2021   Pfizer(Comirnaty)Fall Seasonal Vaccine 12 years and older 11/18/2023   Pneumococcal Conjugate-13 01/06/2020   Pneumococcal Polysaccharide-23 09/05/2012   Td 12/12/2009   Tdap 10/26/2022   Zoster Recombinant(Shingrix) 09/22/2021, 11/23/2021    TDAP status: Up to date  Flu Vaccine status: Declined, Education has been provided regarding the importance of this vaccine but patient still declined. Advised may receive this vaccine at local pharmacy or Health Dept. Aware to provide a copy of the vaccination record if obtained from local pharmacy or Health Dept. Verbalized acceptance and understanding.  Pneumococcal vaccine status: Up to date  Covid-19 vaccine status: Completed vaccines  Qualifies for Shingles Vaccine? Yes   Zostavax completed Yes   Shingrix Completed?: Yes  Screening Tests Health Maintenance  Topic Date Due   FOOT EXAM  01/24/2019   Pneumonia Vaccine 55+ Years old (3 of 3 - PPSV23 or PCV20) 03/02/2020   INFLUENZA VACCINE  03/11/2024 (Originally 07/13/2023)   COVID-19 Vaccine (7 - 2024-25 season) 01/13/2024   HEMOGLOBIN A1C  03/05/2024   OPHTHALMOLOGY EXAM  03/05/2024   Diabetic kidney evaluation - Urine ACR  03/07/2024   MAMMOGRAM  05/16/2024  Diabetic kidney evaluation - eGFR measurement  09/05/2024   Medicare Annual Wellness (AWV)  01/04/2025   DEXA SCAN  09/10/2027   Colonoscopy  01/14/2032   DTaP/Tdap/Td (3 - Td or Tdap) 10/26/2032   Hepatitis C Screening  Completed   HPV VACCINES  Aged Out   Zoster Vaccines- Shingrix  Discontinued    Health Maintenance  Health Maintenance Due  Topic Date Due   FOOT EXAM  01/24/2019   Pneumonia Vaccine 17+ Years old (3 of 3 - PPSV23 or PCV20) 03/02/2020    Colorectal cancer screening: Type of screening: Colonoscopy. Completed 01/13/2022. Repeat every 10 years  Mammogram status: Completed 05/17/2023//. Repeat every year  Bone Density status: Completed 09/09/2022. Results  reflect: Bone density results: NORMAL. Repeat every 3-5 years.  Lung Cancer Screening: (Low Dose CT Chest recommended if Age 32-80 years, 20 pack-year currently smoking OR have quit w/in 15years.) does not qualify.   Lung Cancer Screening Referral: N/A  Additional Screening:  Hepatitis C Screening: does qualify; Completed 12/22/2015  Vision Screening: Recommended annual ophthalmology exams for early detection of glaucoma and other disorders of the eye. Is the patient up to date with their annual eye exam?  Yes  Who is the provider or what is the name of the office in which the patient attends annual eye exams? Dr. Dione Booze If pt is not established with a provider, would they like to be referred to a provider to establish care? No .   Dental Screening: Recommended annual dental exams for proper oral hygiene  Diabetic Foot Exam: Diabetic Foot Exam: Overdue, Pt has been advised about the importance in completing this exam. Pt is scheduled for diabetic foot exam on 03/05/2024.  Community Resource Referral / Chronic Care Management: CRR required this visit?  No   CCM required this visit?  No     Plan:     I have personally reviewed and noted the following in the patient's chart:   Medical and social history Use of alcohol, tobacco or illicit drugs  Current medications and supplements including opioid prescriptions. Patient is not currently taking opioid prescriptions. Functional ability and status Nutritional status Physical activity Advanced directives List of other physicians Hospitalizations, surgeries, and ER visits in previous 12 months Vitals Screenings to include cognitive, depression, and falls Referrals and appointments  In addition, I have reviewed and discussed with patient certain preventive protocols, quality metrics, and best practice recommendations. A written personalized care plan for preventive services as well as general preventive health recommendations were  provided to patient.     Sofi Bryars L Dorrell Mitcheltree, CMA   01/05/2024   After Visit Summary: (In Person-Printed) AVS printed and given to the patient  Nurse Notes: Patient is due for a foot exam.  She is up to date with all other health maintenance.  Patient wanted to let Dr. Lawerance Bach know that she is not taking the Mirabergon 25 mg, however patient stated that she is going to the bathroom 5-6 times in the morning after she get up x 4-6 weeks now.  So patient wanted to know should she start back taking the medication.

## 2024-01-05 NOTE — Patient Instructions (Addendum)
Gloria [name] , Thank you for taking time to come for your Medicare Wellness Visit. I appreciate your ongoing commitment to your health goals. Please review the following plan we discussed and let me know if I can assist you in the future.   Referrals/Orders/Follow-Ups/Clinician Recommendations: It was nice to meet you today.  Keep up the good work.   This is a list of the screening recommended for you and due dates:  Health Maintenance  Topic Date Due   Complete foot exam   01/24/2019   Pneumonia Vaccine (3 of 3 - PPSV23 or PCV20) 03/02/2020   COVID-19 Vaccine (6 - 2024-25 season) 08/13/2023   Medicare Annual Wellness Visit  11/15/2023   Flu Shot  03/11/2024*   Hemoglobin A1C  03/05/2024   Eye exam for diabetics  03/05/2024   Yearly kidney health urinalysis for diabetes  03/07/2024   Mammogram  05/16/2024   Yearly kidney function blood test for diabetes  09/05/2024   DEXA scan (bone density measurement)  09/10/2027   Colon Cancer Screening  01/14/2032   DTaP/Tdap/Td vaccine (3 - Td or Tdap) 10/26/2032   Hepatitis C Screening  Completed   HPV Vaccine  Aged Out   Zoster (Shingles) Vaccine  Discontinued  *Topic was postponed. The date shown is not the original due date.    Advanced directives: (Copy Requested) Please bring a copy of your health care power of attorney and living will to the office to be added to your chart at your convenience.  Next Medicare Annual Wellness Visit scheduled for next year: Yes

## 2024-01-08 ENCOUNTER — Other Ambulatory Visit: Payer: Self-pay | Admitting: Orthopaedic Surgery

## 2024-01-10 ENCOUNTER — Encounter: Payer: Self-pay | Admitting: Physical Medicine and Rehabilitation

## 2024-01-10 ENCOUNTER — Ambulatory Visit (INDEPENDENT_AMBULATORY_CARE_PROVIDER_SITE_OTHER): Payer: No Typology Code available for payment source | Admitting: Physical Medicine and Rehabilitation

## 2024-01-10 DIAGNOSIS — R21 Rash and other nonspecific skin eruption: Secondary | ICD-10-CM

## 2024-01-10 DIAGNOSIS — L231 Allergic contact dermatitis due to adhesives: Secondary | ICD-10-CM

## 2024-01-10 NOTE — Progress Notes (Signed)
Gloria [name] - 73 y.o. female MRN [id number]  Date of birth: [birth year]  Office Visit Note: Visit Date: 01/10/2024 PCP: Pincus Sanes, MD Referred by: Pincus Sanes, MD  Subjective: Chief Complaint  Patient presents with   Lower Back - Follow-up   HPI: Gloria [name] is a 73 y.o. female who comes in today for evaluation of skin irritation to lower back/bilateral buttocks post bilateral L4-L5 facet joint injections performed in our office on 12/31/2022. She is here today for 1 week follow up. Patient states she noticed fluid filled blisters to lower back about 2 hours post injections. She reports itching and burning pain, has tried mupirocin cream and ice pack at home with some relief of pain. I discussed with her today in detail events of her day post injection. States her daughter noticed rash to buttocks before she took off band aids at injection site. She applied ice pack for about 10 minutes after blisters had developed with minimal relief of pain.  Ice pack was applied without a towel.  Patient has undergone multiple sets of facet joint injections in our office over the year, no reports of similar issues. She has several listed allergies/intolerances, no allergies to ethyl chloride or lidocaine or Chloroprep in the past.    Of note, her lower back pain feels much better post injections.           Review of Systems  Skin:  Positive for itching and rash.  Neurological:  Negative for tingling, sensory change, focal weakness and weakness.  All other systems reviewed and are negative.  Otherwise per HPI.  Assessment & Plan: Visit Diagnoses:    ICD-10-CM   1. Rash and other nonspecific skin eruption  R21     2. Contact dermatitis due to adhesives, unspecified contact dermatitis type  L23.1        Plan: Findings:  Acute skin irritation post bilateral L4-L5 facet joint injections performed on 01/01/2024. Patient continue to experience itching and irritation to bilateral lower  back/buttocks. This does appear to be contact dermatitis. There are superficial wounds to bilateral lower back/buttocks, no bleeding, no drainage noted. Wounds are healing appropriately. I discussed wound care with patient in detail today, she can continue with mupirocin cream and wound dressings at home. Dr. Alvester Morin at bedside to discuss, the injection sites for L4-L5 facet joints are located higher anatomically, her wounds are more to upper buttock region. We do feel strongly this could be a reaction to Chloroprep scrub. I will go ahead and list this in her allergies to hopefully prevent further use during procedures. I would like to have patient follow up in 2 weeks for wound re-check, I encouraged her to contact us with any concerns. No red flag symptoms noted upon exam today.     Meds & Orders: No orders of the defined types were placed in this encounter.  No orders of the defined types were placed in this encounter.   Follow-up: Return for 2 week follow up for re-evaluation.   Procedures: No procedures performed      Clinical History: EXAM: MRI LUMBAR SPINE WITHOUT CONTRAST   TECHNIQUE: Multiplanar, multisequence MR imaging of the lumbar spine was performed. No intravenous contrast was administered.   COMPARISON:  Radiography 04/30/2018   FINDINGS: Segmentation:  5 lumbar type vertebrae   Alignment:  Grade 1 anterolisthesis at L4-5   Vertebrae: Marrow edema about the left L4-5 facet. No acute fracture, discitis, or aggressive bone lesion  Conus medullaris and cauda equina: Conus extends to the L2 level. Conus and cauda equina appear normal.   Paraspinal and other soft tissues: Negative   Disc levels:   T12- L1: Unremarkable.   L1-L2: Disc narrowing and bulging. Mild facet spurring. Mild bilateral foraminal narrowing   L2-L3: Unremarkable.   L3-L4: Mild disc narrowing and bulging. Degenerative facet spurring and ligamentum flavum thickening. No neural impingement    L4-L5: Facet osteoarthritis which is advanced with bulky bony and ligamentous hypertrophy, anterolisthesis, and active facet arthritis on the left. The disc is narrowed and bulging and there is advanced spinal stenosis and biforaminal L4 root flattening.   L5-S1:Disc narrowing and bulging with endplate and facet spurring. High-grade Biforaminal impingement. Patent spinal canal.   IMPRESSION: 1. 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 Combined with disc bulging there is compressive spinal and biforaminal stenosis. 2. L5-S1 degenerative biforaminal impingement. 3. L1-2 noncompressive disc bulging.     Electronically Signed   By: Marnee Spring M.D.   On: 09/16/2020 09:36   She reports that she has never smoked. She has never used smokeless tobacco.  Recent Labs    03/08/23 1214 07/05/23 1156 09/06/23 1636  HGBA1C 6.1 6.1* 5.7    Objective:  VS:  HT:    WT:   BMI:     BP:   HR: bpm  TEMP: ( )  RESP:  Physical Exam Vitals and nursing note reviewed.  HENT:     Head: Normocephalic and atraumatic.     Right Ear: External ear normal.     Left Ear: External ear normal.     Nose: Nose normal.     Mouth/Throat:     Mouth: Mucous membranes are moist.  Eyes:     Extraocular Movements: Extraocular movements intact.  Cardiovascular:     Rate and Rhythm: Normal rate.     Pulses: Normal pulses.  Pulmonary:     Effort: Pulmonary effort is normal.  Abdominal:     General: Abdomen is flat. There is no distension.  Musculoskeletal:        General: Normal range of motion.     Cervical back: Normal range of motion.  Skin:    General: Skin is warm and dry.     Capillary Refill: Capillary refill takes less than 2 seconds.          Comments: Superficial wounds to bilateral lower back/buttocks, no drainage, no signs of infection noted.   Neurological:     General: No focal deficit present.     Mental Status: She is alert and oriented  to person, place, and time.  Psychiatric:        Mood and Affect: Mood normal.        Behavior: Behavior normal.     Ortho Exam  Imaging: No results found.  Past Medical/Family/Surgical/Social History: Medications & Allergies reviewed per EMR, new medications updated. Patient Active Problem List   Diagnosis Date Noted   Urinary frequency 11/22/2023   Abdominal cramping 10/06/2023   Overactive bladder 09/06/2023   Status post total right knee replacement 07/14/23 07/14/2023   CKD (chronic kidney disease) stage 3, GFR 30-59 ml/min (HCC) 03/08/2023   Arthropathy of lumbar facet joint 11/14/2022   Chronic midline low back pain without sciatica 09/29/2021   Bradycardia 08/20/2020   Acute cervical radiculopathy 07/09/2020   Pruritus 02/06/2020   Raynaud's phenomenon 01/06/2020   Left wrist tendinitis 04/30/2018   Hand numbness 01/24/2018   Lumbar radiculopathy  12/23/2016   Plantar fasciitis of right foot 06/22/2016   Carpal tunnel syndrome of right wrist 03/07/2013   Myasthenia gravis without exacerbation (HCC) 12/31/2010   Alopecia 12/31/2010   Hypothyroidism 12/29/2010   Type 2 diabetes, diet controlled (HCC) 12/29/2010   Hyperlipidemia 12/29/2010   Essential hypertension 12/29/2010   Coronary atherosclerosis 12/29/2010   Osteoarthritis 12/29/2010   Past Medical History:  Diagnosis Date   Alopecia areata 10/2010 onset   Arthritis    Diabetes mellitus, type 2 (HCC)    no longer per pt   Dyslipidemia    Hyperlipidemia    on meds   Hypertension    on meds   Hypothyroid    on meds   Myasthenia gravis    Family History  Problem Relation Age of Onset   Colon cancer Neg Hx    Colon polyps Neg Hx    Esophageal cancer Neg Hx    Rectal cancer Neg Hx    Stomach cancer Neg Hx    Past Surgical History:  Procedure Laterality Date   ABDOMINAL HYSTERECTOMY  12/12/1988   CARPAL TUNNEL RELEASE Left    CARPAL TUNNEL RELEASE Right    COLONOSCOPY  2008   [facility]   TOTAL  KNEE ARTHROPLASTY Right 07/14/2023   Procedure: RIGHT TOTAL KNEE ARTHROPLASTY;  Surgeon: Kathryne Hitch, MD;  Location: [facility];  Service: Orthopedics;  Laterality: Right;   Social History   Occupational History   Occupation: retired  Tobacco Use   Smoking status: Never   Smokeless tobacco: Never  Vaping Use   Vaping status: Never Used  Substance and Sexual Activity   Alcohol use: No   Drug use: Never   Sexual activity: Never

## 2024-01-10 NOTE — Procedures (Signed)
Lumbar Facet Joint Intra-Articular Injection(s) with Fluoroscopic Guidance  Patient: Gloria [name]      Date of Birth: 1951-11-24 MRN: [id number] PCP: Pincus Sanes, MD      Visit Date: 01/01/2024   Universal Protocol:    Date/Time: 01/01/2024  Consent Given By: the patient  Position: PRONE   Additional Comments: Vital signs were monitored before and after the procedure. Patient was prepped and draped in the usual sterile fashion. The correct patient, procedure, and site was verified.   Injection Procedure Details:  Procedure Site One Meds Administered:  Meds ordered this encounter  Medications   methylPREDNISolone acetate (DEPO-MEDROL) injection 40 mg     Laterality: Bilateral  Location/Site:  L4-L5  Needle size: 22 guage  Needle type: Spinal  Needle Placement: Articular  Findings:  -Comments: Excellent flow of contrast producing a partial arthrogram.  Procedure Details: The fluoroscope beam is vertically oriented in AP, and the inferior recess is visualized beneath the lower pole of the inferior apophyseal process, which represents the target point for needle insertion. When direct visualization is difficult the target point is located at the medial projection of the vertebral pedicle. The region overlying each aforementioned target is locally anesthetized with a 1 to 2 ml. volume of 1% Lidocaine without Epinephrine.   The spinal needle was inserted into each of the above mentioned facet joints using biplanar fluoroscopic guidance. A 0.25 to 0.5 ml. volume of Isovue-250 was injected and a partial facet joint arthrogram was obtained. A single spot film was obtained of the resulting arthrogram.    One to 1.25 ml of the steroid/anesthetic solution was then injected into each of the facet joints noted above.   Additional Comments:  No complications occurred Dressing: 2 x 2 sterile gauze and Band-Aid    Post-procedure details: Patient was observed during the  procedure. Post-procedure instructions were reviewed.  Patient left the clinic in stable condition.

## 2024-01-11 ENCOUNTER — Telehealth: Payer: Self-pay

## 2024-01-11 DIAGNOSIS — L659 Nonscarring hair loss, unspecified: Secondary | ICD-10-CM

## 2024-01-11 DIAGNOSIS — L299 Pruritus, unspecified: Secondary | ICD-10-CM

## 2024-01-11 NOTE — Telephone Encounter (Signed)
Copied from CRM [id number]. Topic: Referral - Request for Referral >> Jan 11, 2024  1:16 PM Lovey Newcomer R wrote: Did the patient discuss referral with their provider in the last year? Yes (If No - schedule appointment) (If Yes - send message)  Appointment offered? No. Last referral was denied as they are not accepting new patients.  Type of order/referral and detailed reason for visit: Dermatologist for hair thinning and scalp itching  Preference of office, provider, location: Prefers to see Dr. Isaac Laud, MD in [facility] at [address] [place] [address]. If she is not available, she would ask for an [place] doctor is either [facility] or [employer]  If referral order, have you been seen by this specialty before? No (If Yes, this issue or another issue? When? Where?  Can we respond through MyChart? No

## 2024-01-12 NOTE — Telephone Encounter (Signed)
 Spoke with patient today.

## 2024-01-12 NOTE — Telephone Encounter (Signed)
I ordered a referral for Dr. Darreld Mclean.  I know she is extremely hard to get into and there may be up to a year wait.  Her office should call her to schedule an appointment.  If it is a long time she can look in to another provider to see, but I am not sure about anyone around here

## 2024-01-17 NOTE — Telephone Encounter (Signed)
 Copied from CRM [id number]. Topic: Referral - Status >> Jan 17, 2024 12:13 PM Le[facility] ORN wrote: Reason for CRM: patient calling back in regards to referral [id number], advised her it is still pending, she stated carla told her to call back if she has not heard anything, so patient is requesting carla to call her back at [phone]

## 2024-01-18 NOTE — Telephone Encounter (Signed)
 Called and left message for patient.  If she calls back she needs an appointment before we can refer her since this wasn't through documented during her last visit.    Dr. Geofm has offered to move her appointment up for next month sooner or she can keep appointment and we can address then.

## 2024-01-24 ENCOUNTER — Ambulatory Visit: Payer: No Typology Code available for payment source | Admitting: Physical Medicine and Rehabilitation

## 2024-01-31 ENCOUNTER — Ambulatory Visit: Payer: No Typology Code available for payment source | Admitting: Physical Medicine and Rehabilitation

## 2024-02-01 ENCOUNTER — Encounter: Payer: Self-pay | Admitting: Internal Medicine

## 2024-02-01 NOTE — Progress Notes (Unsigned)
Subjective:    Patient ID: Gloria [name], female    DOB: 1951-08-16, 73 y.o.   MRN: [id number]     HPI Sienna is here for follow up of her chronic medical problems.  OAB - urinates 6 times in a 2 hour period.  Drinking a lot of water.  Nocturia 2-3   Hair loss - wants referral to derm.  Her scalp itches a lot.  Her hair is very thin.  No change in hair on arms/legs.    Exercising 3-4 times a week.    Medications and allergies reviewed with patient and updated if appropriate.  Current Outpatient Medications on File Prior to Visit  Medication Sig Dispense Refill   acetaminophen (TYLENOL) 500 MG tablet Take 500 mg by mouth every 6 (six) hours as needed for moderate pain or mild pain.     amLODipine (NORVASC) 5 MG tablet TAKE 1 TABLET(5 MG) BY MOUTH DAILY 90 tablet 2   Ascorbic Acid (VITAMIN C PO) Take 1,000 mg by mouth daily at 6 (six) AM.     atorvastatin (LIPITOR) 10 MG tablet TAKE 1 TABLET BY MOUTH DAILY AT 6 PM 90 tablet 2   BIOTIN 5000 PO Take 10,000 mcg by mouth daily. Hair, skin, Nails     Blood Pressure Monitoring (BLOOD PRESSURE MONITOR AUTOMAT) DEVI 1 kit by Does not apply route as directed. Please dispense blood pressure kit covered by insurance 1 each 0   CALCIUM-MAGNESIUM-ZINC PO Take 1 tablet by mouth daily at 6 (six) AM.     celecoxib (CELEBREX) 200 MG capsule TAKE 1 CAPSULE(200 MG) BY MOUTH TWICE DAILY BETWEEN MEALS AS NEEDED 60 capsule 1   Cholecalciferol (VITAMIN D) 50 MCG (2000 UT) CAPS Take 2,000 Units by mouth daily.     Cyanocobalamin (VITAMIN B 12 PO) Take 3,000 mcg by mouth daily at 6 (six) AM.     diclofenac Sodium (VOLTAREN) 1 % GEL Apply 4 g topically 4 (four) times daily. (Patient taking differently: Apply 4 g topically daily as needed (pain).) 100 g 5   Ferrous Sulfate Dried (SLOW RELEASE IRON) 45 MG TBCR Take 1 tablet by mouth daily.     levothyroxine (SYNTHROID) 100 MCG tablet TAKE 1 TABLET EVERY DAY 30 MINS PRIOR TO BREAKFAST 6 DAYS A WEEK AND  1/2 TABLET 1 DAY A WEEK 90 tablet 1   mirabegron ER (MYRBETRIQ) 25 MG TB24 tablet Take 1 tablet (25 mg total) by mouth daily. 90 tablet 1   Multiple Vitamins-Minerals (WOMENS 50+ MULTI VITAMIN PO) Take 1 tablet by mouth daily at 6 (six) AM.     mupirocin ointment (BACTROBAN) 2 % APPLY TOPICALLY TO THE AFFECTED AREA DAILY AS NEEDED 30 g 0   nitrofurantoin, macrocrystal-monohydrate, (MACROBID) 100 MG capsule Take 1 capsule (100 mg total) by mouth 2 (two) times daily. 14 capsule 0   Omega-3 1000 MG CAPS Take 1,000 mg by mouth daily.     Polyethyl Glycol-Propyl Glycol (SYSTANE) 0.4-0.3 % SOLN Place 1 drop into both eyes daily as needed (dry eyes).     telmisartan (MICARDIS) 80 MG tablet TAKE 1 TABLET(80 MG) BY MOUTH DAILY 90 tablet 1   triamcinolone cream (KENALOG) 0.1 % APPLY TOPICALLY TO THE AFFECTED AREA TWICE DAILY 30 g 0   aspirin 81 MG chewable tablet Chew 1 tablet (81 mg total) by mouth 2 (two) times daily. (Patient not taking: Reported on 02/02/2024) 30 tablet 0   No current facility-administered medications on file prior to  visit.     Review of Systems  Constitutional:  Negative for fever.  Respiratory:  Negative for cough, shortness of breath and wheezing.   Cardiovascular:  Negative for chest pain, palpitations and leg swelling.  Neurological:  Negative for light-headedness and headaches.       Objective:   Vitals:   02/02/24 1333  BP: 134/76  Pulse: 60  Temp: 98.3 F (36.8 C)  SpO2: 96%   BP Readings from Last 3 Encounters:  02/02/24 134/76  01/05/24 (!) 140/80  11/22/23 136/80   Wt Readings from Last 3 Encounters:  02/02/24 154 lb (69.9 kg)  01/05/24 154 lb 3.2 oz (69.9 kg)  11/22/23 151 lb (68.5 kg)   Body mass index is 25.63 kg/m.    Physical Exam Constitutional:      General: She is not in acute distress.    Appearance: Normal appearance.  HENT:     Head: Normocephalic and atraumatic.  Eyes:     Conjunctiva/sclera: Conjunctivae normal.   Cardiovascular:     Rate and Rhythm: Normal rate and regular rhythm.     Heart sounds: Normal heart sounds.  Pulmonary:     Effort: Pulmonary effort is normal. No respiratory distress.     Breath sounds: Normal breath sounds. No wheezing.  Musculoskeletal:     Cervical back: Neck supple.     Right lower leg: No edema.     Left lower leg: No edema.  Lymphadenopathy:     Cervical: No cervical adenopathy.  Skin:    General: Skin is warm and dry.     Findings: No rash.  Neurological:     Mental Status: She is alert. Mental status is at baseline.  Psychiatric:        Mood and Affect: Mood normal.        Behavior: Behavior normal.        Lab Results  Component Value Date   WBC 5.6 09/06/2023   HGB 11.6 (L) 09/06/2023   HCT 35.7 (L) 09/06/2023   PLT 230.0 09/06/2023   GLUCOSE 90 09/06/2023   CHOL 180 09/06/2023   TRIG 84.0 09/06/2023   HDL 52.70 09/06/2023   LDLCALC 110 (H) 09/06/2023   ALT 9 09/06/2023   AST 19 09/06/2023   NA 139 09/06/2023   K 4.4 09/06/2023   CL 103 09/06/2023   CREATININE 1.05 09/06/2023   BUN 15 09/06/2023   CO2 30 09/06/2023   TSH 5.74 (H) 09/06/2023   HGBA1C 5.7 09/06/2023   MICROALBUR 4.0 (H) 03/08/2023     Assessment & Plan:    See Problem List for Assessment and Plan of chronic medical problems.

## 2024-02-01 NOTE — Patient Instructions (Addendum)
      Blood work was ordered.       Medications changes include :   None    A referral was ordered and someone will call you to schedule an appointment.     Return in about 6 months (around 08/01/2024) for Physical Exam.

## 2024-02-02 ENCOUNTER — Ambulatory Visit: Payer: Medicare HMO | Admitting: Internal Medicine

## 2024-02-02 VITALS — BP 134/76 | HR 60 | Temp 98.3°F | Ht 65.0 in | Wt 154.0 lb

## 2024-02-02 DIAGNOSIS — I2510 Atherosclerotic heart disease of native coronary artery without angina pectoris: Secondary | ICD-10-CM

## 2024-02-02 DIAGNOSIS — E7849 Other hyperlipidemia: Secondary | ICD-10-CM | POA: Diagnosis not present

## 2024-02-02 DIAGNOSIS — E119 Type 2 diabetes mellitus without complications: Secondary | ICD-10-CM | POA: Diagnosis not present

## 2024-02-02 DIAGNOSIS — L659 Nonscarring hair loss, unspecified: Secondary | ICD-10-CM

## 2024-02-02 DIAGNOSIS — I10 Essential (primary) hypertension: Secondary | ICD-10-CM

## 2024-02-02 DIAGNOSIS — N1831 Chronic kidney disease, stage 3a: Secondary | ICD-10-CM | POA: Diagnosis not present

## 2024-02-02 DIAGNOSIS — E039 Hypothyroidism, unspecified: Secondary | ICD-10-CM

## 2024-02-02 LAB — COMPREHENSIVE METABOLIC PANEL
ALT: 13 U/L (ref 0–35)
AST: 23 U/L (ref 0–37)
Albumin: 4.2 g/dL (ref 3.5–5.2)
Alkaline Phosphatase: 66 U/L (ref 39–117)
BUN: 15 mg/dL (ref 6–23)
CO2: 33 meq/L — ABNORMAL HIGH (ref 19–32)
Calcium: 9.5 mg/dL (ref 8.4–10.5)
Chloride: 102 meq/L (ref 96–112)
Creatinine, Ser: 0.99 mg/dL (ref 0.40–1.20)
GFR: 56.96 mL/min — ABNORMAL LOW (ref >60.00)
Glucose, Bld: 81 mg/dL (ref 70–99)
Potassium: 4.3 meq/L (ref 3.5–5.1)
Sodium: 138 meq/L (ref 135–145)
Total Bilirubin: 0.5 mg/dL (ref 0.2–1.2)
Total Protein: 7.8 g/dL (ref 6.0–8.3)

## 2024-02-02 LAB — CBC WITH DIFFERENTIAL/PLATELET
Basophils Absolute: 0 10*3/uL (ref 0.0–0.1)
Basophils Relative: 0.5 % (ref 0.0–3.0)
Eosinophils Absolute: 0.3 10*3/uL (ref 0.0–0.7)
Eosinophils Relative: 6.8 % — ABNORMAL HIGH (ref 0.0–5.0)
HCT: 37.8 % (ref 36.0–46.0)
Hemoglobin: 12.4 g/dL (ref 12.0–15.0)
Lymphocytes Relative: 30.8 % (ref 12.0–46.0)
Lymphs Abs: 1.3 10*3/uL (ref 0.7–4.0)
MCHC: 32.7 g/dL (ref 30.0–36.0)
MCV: 94.1 fL (ref 78.0–100.0)
Monocytes Absolute: 0.6 10*3/uL (ref 0.1–1.0)
Monocytes Relative: 14.9 % — ABNORMAL HIGH (ref 3.0–12.0)
Neutro Abs: 1.9 10*3/uL (ref 1.4–7.7)
Neutrophils Relative %: 47 % (ref 43.0–77.0)
Platelets: 177 10*3/uL (ref 150.0–400.0)
RBC: 4.02 Mil/uL (ref 3.87–5.11)
RDW: 13.8 % (ref 11.5–15.5)
WBC: 4.1 10*3/uL (ref 4.0–10.5)

## 2024-02-02 LAB — LIPID PANEL
Cholesterol: 154 mg/dL (ref 0–200)
HDL: 53.3 mg/dL (ref >39.00)
LDL Cholesterol: 78 mg/dL (ref 0–99)
NonHDL: 100.67
Total CHOL/HDL Ratio: 3
Triglycerides: 111 mg/dL (ref 0.0–149.0)
VLDL: 22.2 mg/dL (ref 0.0–40.0)

## 2024-02-02 LAB — VITAMIN B12: Vitamin B-12: >1537 pg/mL — ABNORMAL HIGH (ref 211–911)

## 2024-02-02 LAB — TSH: TSH: 1.47 u[IU]/mL (ref 0.35–5.50)

## 2024-02-02 LAB — HEMOGLOBIN A1C: Hgb A1c MFr Bld: 5.9 % (ref 4.6–6.5)

## 2024-02-02 NOTE — Assessment & Plan Note (Signed)
Chronic Does not take NSAIDs, only Tylenol Increase water intake Continue low-sodium diet Stressed good BP, sugar control CBC, CMP

## 2024-02-02 NOTE — Assessment & Plan Note (Signed)
Chronic No symptoms c/w angina Continue atorvastatin 10 mg daily, ASA 81 mg daily

## 2024-02-02 NOTE — Assessment & Plan Note (Signed)
Chronic Has gotten worse Check B12, tsh No loss of hair on arms, legs Referral to [facility]

## 2024-02-02 NOTE — Assessment & Plan Note (Signed)
Chronic Check lipid panel  Continue atorvastatin 10 mg daily Regular exercise and healthy diet encouraged

## 2024-02-02 NOTE — Assessment & Plan Note (Signed)
Chronic  Lab Results  Component Value Date   HGBA1C 5.7 09/06/2023   Sugars controlled Check A1c Continue lifestyle control Stressed regular exercise, diabetic diet

## 2024-02-02 NOTE — Assessment & Plan Note (Signed)
Chronic  Clinically euthyroid Check tsh and will titrate med dose if needed Currently taking levothyroxine 100 mcg daily 6 days a week, 50 mcg daily one day a week

## 2024-02-02 NOTE — Assessment & Plan Note (Signed)
Chronic Initial BP elevated - repeat is better CBC, CMP Regular exercise, low-sodium diet Continue amlodipine 5 mg daily, telmisartan 80 mg daily

## 2024-02-04 ENCOUNTER — Other Ambulatory Visit: Payer: Self-pay | Admitting: Internal Medicine

## 2024-02-05 ENCOUNTER — Encounter: Payer: Self-pay | Admitting: Physical Medicine and Rehabilitation

## 2024-02-05 ENCOUNTER — Ambulatory Visit (INDEPENDENT_AMBULATORY_CARE_PROVIDER_SITE_OTHER): Payer: No Typology Code available for payment source | Admitting: Physical Medicine and Rehabilitation

## 2024-02-05 VITALS — BP 174/90 | HR 78

## 2024-02-05 DIAGNOSIS — R21 Rash and other nonspecific skin eruption: Secondary | ICD-10-CM

## 2024-02-05 DIAGNOSIS — L231 Allergic contact dermatitis due to adhesives: Secondary | ICD-10-CM

## 2024-02-05 NOTE — Progress Notes (Unsigned)
 Pain Score-0 Patient here for wound check from procedure

## 2024-02-05 NOTE — Progress Notes (Unsigned)
 Gloria [name] - 73 y.o. female MRN [id number]  Date of birth: 1951-03-09  Office Visit Note: Visit Date: 02/05/2024 PCP: Pincus Sanes, MD Referred by: Pincus Sanes, MD  Subjective: Chief Complaint  Patient presents with   Lower Back - Wound Check   HPI: Gloria [name] is a 73 y.o. female who comes in today for evaluation of skin irritation to lower back/bilateral buttocks post bilateral L4-L5 facet joint injections performed in our office on 12/31/2022. She is here today for follow up. She noticed fluid filled blisters to bilateral lower back about 2 hours post injection. She did apply ice pack for about 10 minutes with minimal relief of pain. We have continued to follow her over the last several weeks. At this point, she feels much better and reports are healing appropriately. She is now leaving wounds open to air. Her lower back pain continues to be manageable, much better post injections. Patient denies any new complaints this time.     Review of Systems  Musculoskeletal:  Negative for back pain.  Skin:  Positive for rash.  Neurological:  Negative for tingling, sensory change, focal weakness and weakness.  All other systems reviewed and are negative.  Otherwise per HPI.  Assessment & Plan: Visit Diagnoses:    ICD-10-CM   1. Rash and other nonspecific skin eruption  R21     2. Contact dermatitis due to adhesives, unspecified contact dermatitis type  L23.1        Plan: Findings:  Acute skin irritation post bilateral L4-L5 facet joint injections performed on 01/01/2024. Patient continues to experience itching and irritation to bilateral lower back/buttocks. This does appear to be contact dermatitis. There are two superficial wounds to bilateral lower back/buttocks, no bleeding, no drainage noted. Wounds are healing appropriately, no open skin, no drainage noted upon exam today. We do believe this is a type of allergic reaction to ChloraPrep. ChloraPrep is now listed as an  allergy. I instructed her to try Vitamin E cream to minimize scarring and reassured her that wounds should continue to heal well over time. Should her lower back pain return we would consider repeating lumbar facet joint injections infrequently. She has no questions at this time, can follow up with [place] as needed. No red flag symptoms noted upon exam today.     Meds & Orders: No orders of the defined types were placed in this encounter.  No orders of the defined types were placed in this encounter.   Follow-up: Return if symptoms worsen or fail to improve.   Procedures: No procedures performed      Clinical History: EXAM: MRI LUMBAR SPINE WITHOUT CONTRAST   TECHNIQUE: Multiplanar, multisequence MR imaging of the lumbar spine was performed. No intravenous contrast was administered.   COMPARISON:  Radiography 04/30/2018   FINDINGS: Segmentation:  5 lumbar type vertebrae   Alignment:  Grade 1 anterolisthesis at L4-5   Vertebrae: Marrow edema about the left L4-5 facet. No acute fracture, discitis, or aggressive bone lesion   Conus medullaris and cauda equina: Conus extends to the L2 level. Conus and cauda equina appear normal.   Paraspinal and other soft tissues: Negative   Disc levels:   T12- L1: Unremarkable.   L1-L2: Disc narrowing and bulging. Mild facet spurring. Mild bilateral foraminal narrowing   L2-L3: Unremarkable.   L3-L4: Mild disc narrowing and bulging. Degenerative facet spurring and ligamentum flavum thickening. No neural impingement   L4-L5: Facet osteoarthritis which is advanced with bulky bony  and ligamentous hypertrophy, anterolisthesis, and active facet arthritis on the left. The disc is narrowed and bulging and there is advanced spinal stenosis and biforaminal L4 root flattening.   L5-S1:Disc narrowing and bulging with endplate and facet spurring. High-grade Biforaminal impingement. Patent spinal canal.   IMPRESSION: 1. L4-5 severe facet  osteoarthritis with anterolisthesis and left-sided marrow edema. Combined with disc bulging there is compressive spinal and biforaminal stenosis. 2. L5-S1 degenerative biforaminal impingement. 3. L1-2 noncompressive disc bulging.     Electronically Signed   By: Marnee Spring M.D.   On: 09/16/2020 09:36   She reports that she has never smoked. She has never used smokeless tobacco.  Recent Labs    07/05/23 1156 09/06/23 1636 02/02/24 1425  HGBA1C 6.1* 5.7 5.9    Objective:  VS:  HT:    WT:   BMI:     BP:(!) 174/90  HR:78bpm  TEMP: ( )  RESP:  Physical Exam Vitals and nursing note reviewed.  HENT:     Head: Normocephalic and atraumatic.     Right Ear: External ear normal.     Left Ear: External ear normal.     Nose: Nose normal.     Mouth/Throat:     Mouth: Mucous membranes are moist.  Eyes:     Extraocular Movements: Extraocular movements intact.  Cardiovascular:     Rate and Rhythm: Normal rate.     Pulses: Normal pulses.  Pulmonary:     Effort: Pulmonary effort is normal.  Abdominal:     General: Abdomen is flat. There is no distension.  Musculoskeletal:        General: Normal range of motion.     Cervical back: Normal range of motion.  Skin:    General: Skin is warm and dry.     Capillary Refill: Capillary refill takes less than 2 seconds.          Comments: Two superficial wounds to bilateral lower back/buttocks, no drainage, no signs of infection noted. Wounds appear to be dry, there is some discoloration of the skin.  Neurological:     General: No focal deficit present.     Mental Status: She is alert and oriented to person, place, and time.  Psychiatric:        Mood and Affect: Mood normal.        Behavior: Behavior normal.     Ortho Exam  Imaging: No results found.  Past Medical/Family/Surgical/Social History: Medications & Allergies reviewed per EMR, new medications updated. Patient Active Problem List   Diagnosis Date Noted   Urinary  frequency 11/22/2023   Abdominal cramping 10/06/2023   Overactive bladder 09/06/2023   Status post total right knee replacement 07/14/23 07/14/2023   CKD (chronic kidney disease) stage 3, GFR 30-59 ml/min (HCC) 03/08/2023   Arthropathy of lumbar facet joint 11/14/2022   Chronic midline low back pain without sciatica 09/29/2021   Bradycardia 08/20/2020   Acute cervical radiculopathy 07/09/2020   Pruritus 02/06/2020   Raynaud's phenomenon 01/06/2020   Left wrist tendinitis 04/30/2018   Hand numbness 01/24/2018   Hair loss 01/24/2018   Lumbar radiculopathy 12/23/2016   Plantar fasciitis of right foot 06/22/2016   Carpal tunnel syndrome of right wrist 03/07/2013   Myasthenia gravis without exacerbation (HCC) 12/31/2010   Alopecia 12/31/2010   Hypothyroidism 12/29/2010   Type 2 diabetes, diet controlled (HCC) 12/29/2010   Hyperlipidemia 12/29/2010   Essential hypertension 12/29/2010   Coronary atherosclerosis 12/29/2010   Osteoarthritis 12/29/2010   Past Medical  History:  Diagnosis Date   Alopecia areata 10/2010 onset   Arthritis    Diabetes mellitus, type 2 (HCC)    no longer per pt   Dyslipidemia    Hyperlipidemia    on meds   Hypertension    on meds   Hypothyroid    on meds   Myasthenia gravis    Family History  Problem Relation Age of Onset   Colon cancer Neg Hx    Colon polyps Neg Hx    Esophageal cancer Neg Hx    Rectal cancer Neg Hx    Stomach cancer Neg Hx    Past Surgical History:  Procedure Laterality Date   ABDOMINAL HYSTERECTOMY  12/12/1988   CARPAL TUNNEL RELEASE Left    CARPAL TUNNEL RELEASE Right    COLONOSCOPY  2008   [facility]   TOTAL KNEE ARTHROPLASTY Right 07/14/2023   Procedure: RIGHT TOTAL KNEE ARTHROPLASTY;  Surgeon: Kathryne Hitch, MD;  Location: [facility];  Service: Orthopedics;  Laterality: Right;   Social History   Occupational History   Occupation: retired  Tobacco Use   Smoking status: Never   Smokeless tobacco: Never  Vaping  Use   Vaping status: Never Used  Substance and Sexual Activity   Alcohol use: No   Drug use: Never   Sexual activity: Never

## 2024-02-27 ENCOUNTER — Telehealth: Payer: Self-pay | Admitting: Internal Medicine

## 2024-02-27 ENCOUNTER — Telehealth: Payer: Self-pay

## 2024-02-27 NOTE — Telephone Encounter (Signed)
 Copied from CRM [id number]. Topic: General - Other >> Feb 27, 2024 10:12 AM Turkey A wrote:  Reason for CRM: Patient called to inform Dr.Burns that she has scheduled appointment with Dermatologist Onalee Hua on 09/19/24. Patient would like for Dr.Burns to see if she might could get her in sooner-please contact

## 2024-02-27 NOTE — Telephone Encounter (Unsigned)
 Copied from CRM [id number]. Topic: General - Other >> Feb 27, 2024 10:12 AM Turkey A wrote:  Reason for CRM: Patient called to inform Dr.Burns that she has scheduled appointment with Dermatologist Onalee Hua on 09/19/24. Patient would like for Dr.Burns to see if she might could get her in sooner-please contact

## 2024-02-28 NOTE — Telephone Encounter (Signed)
 Duplicate

## 2024-02-28 NOTE — Telephone Encounter (Signed)
 Spoke with patient today.

## 2024-03-05 ENCOUNTER — Ambulatory Visit: Payer: Medicare HMO | Admitting: Internal Medicine

## 2024-03-06 DIAGNOSIS — H04123 Dry eye syndrome of bilateral lacrimal glands: Secondary | ICD-10-CM | POA: Diagnosis not present

## 2024-03-06 DIAGNOSIS — H43813 Vitreous degeneration, bilateral: Secondary | ICD-10-CM | POA: Diagnosis not present

## 2024-03-06 DIAGNOSIS — H2513 Age-related nuclear cataract, bilateral: Secondary | ICD-10-CM | POA: Diagnosis not present

## 2024-03-21 ENCOUNTER — Other Ambulatory Visit: Payer: Self-pay | Admitting: Internal Medicine

## 2024-03-21 NOTE — Telephone Encounter (Signed)
 Copied from CRM [id number]. Topic: Clinical - Medication Refill >> Mar 21, 2024  8:39 AM Alcus Dad wrote: Most Recent Primary Care Visit:  Provider: BURNS, Bobette Mo  Department: [facility]  Visit Type: OFFICE VISIT  Date: 02/02/2024  Medication: telmisartan (MICARDIS) 80 MG tablet (90 day supply per pharmacy)  Has the patient contacted their pharmacy? Yes (Agent: If no, request that the patient contact the pharmacy for the refill. If patient does not wish to contact the pharmacy document the reason why and proceed with request.) (Agent: If yes, when and what did the pharmacy advise?)  Is this the correct pharmacy for this prescription? Yes If no, delete pharmacy and type the correct one.  This is the patient's preferred pharmacy:  [id number] [place], [place] - [phone] AT [facility] OF [facility] & [address] [place] [address] Phone: [phone] Fax: [phone]   Has the prescription been filled recently? No  Is the patient out of the medication? No  Has the patient been seen for an appointment in the last year OR does the patient have an upcoming appointment? Yes  Can we respond through MyChart? No  Agent: Please be advised that Rx refills may take up to 3 business days. We ask that you follow-up with your pharmacy.

## 2024-03-22 ENCOUNTER — Other Ambulatory Visit: Payer: Self-pay | Admitting: Internal Medicine

## 2024-03-31 ENCOUNTER — Telehealth: Payer: Self-pay | Admitting: Internal Medicine

## 2024-03-31 DIAGNOSIS — E119 Type 2 diabetes mellitus without complications: Secondary | ICD-10-CM

## 2024-03-31 NOTE — Telephone Encounter (Signed)
 Can you please call her and let her know there was a lab error with her last urine test that we did to check for kidney damage last fall.     I think we should repeat this.  We can consider add a medication that is for diabetes that helps protect the kidneys.    Urine test ordered.  We will call her with the result.

## 2024-04-02 NOTE — Telephone Encounter (Signed)
 LM for patient.  If she returns phone call please relay Dr. Donnette Gal message and set up her lab appointment.

## 2024-04-03 ENCOUNTER — Other Ambulatory Visit: Payer: Self-pay | Admitting: Internal Medicine

## 2024-04-03 DIAGNOSIS — Z1231 Encounter for screening mammogram for malignant neoplasm of breast: Secondary | ICD-10-CM

## 2024-04-03 NOTE — Telephone Encounter (Signed)
 Copied from CRM [id number]. Topic: Clinical - Lab/Test Results >> Apr 03, 2024 10:06 AM Varney Gentleman wrote: Reason for CRM: Patient returned call to [facility], agent gave patient message as stated patient acknowledged and scheduled her lab appointment for 4/25 at 11:30 am.

## 2024-04-05 ENCOUNTER — Other Ambulatory Visit (INDEPENDENT_AMBULATORY_CARE_PROVIDER_SITE_OTHER)

## 2024-04-05 DIAGNOSIS — E119 Type 2 diabetes mellitus without complications: Secondary | ICD-10-CM | POA: Diagnosis not present

## 2024-04-05 LAB — MICROALBUMIN / CREATININE URINE RATIO
Creatinine,U: 84.1 mg/dL
Microalb Creat Ratio: 14.9 mg/g (ref 0.0–30.0)
Microalb, Ur: 1.3 mg/dL (ref 0.0–1.9)

## 2024-05-08 ENCOUNTER — Telehealth: Payer: Self-pay

## 2024-05-08 NOTE — Telephone Encounter (Signed)
 This patient is appearing on a report for being at risk of failing the adherence measure for hypertension (ACEi/ARB) medications this calendar year.   Medication: telmisartan  80 mg Last fill date: 04/25/24 for 30 day supply  Insurance report was not up to date. No action needed at this time.   Abelina Abide, PharmD PGY1 Pharmacy Resident 05/08/2024 9:26 AM

## 2024-05-10 ENCOUNTER — Other Ambulatory Visit: Payer: Self-pay

## 2024-05-10 ENCOUNTER — Telehealth: Payer: Self-pay | Admitting: Internal Medicine

## 2024-05-10 MED ORDER — CLOBETASOL PROPIONATE 0.05 % EX SOLN: CUTANEOUS | 2 refills | Status: AC

## 2024-05-10 NOTE — Telephone Encounter (Signed)
 Sent today

## 2024-05-10 NOTE — Telephone Encounter (Signed)
 Copied from CRM [id number]. Topic: Clinical - Medication Refill >> May 10, 2024 10:49 AM Chuck Crater wrote: Medication: clobetasol  (TEMOVATE ) 0.05 % external solution  Has the patient contacted their pharmacy? No (Agent: If no, request that the patient contact the pharmacy for the refill. If patient does not wish to contact the pharmacy document the reason why and proceed with request.) (Agent: If yes, when and what did the pharmacy advise?) No refills as of 03/01/24  This is the patient's preferred pharmacy:  [id number] [place], [place] - [address] AT [facility] OF [facility] & [address] [place] [address] Phone: [phone] Fax: [phone]  Is this the correct pharmacy for this prescription? Yes If no, delete pharmacy and type the correct one.   Has the prescription been filled recently? No  Is the patient out of the medication? Yes  Has the patient been seen for an appointment in the last year OR does the patient have an upcoming appointment? Yes  Can we respond through MyChart? No  Agent: Please be advised that Rx refills may take up to 3 business days. We ask that you follow-up with your pharmacy.

## 2024-05-15 ENCOUNTER — Ambulatory Visit (INDEPENDENT_AMBULATORY_CARE_PROVIDER_SITE_OTHER): Admitting: Internal Medicine

## 2024-05-15 ENCOUNTER — Ambulatory Visit: Payer: Self-pay | Admitting: *Deleted

## 2024-05-15 ENCOUNTER — Encounter: Payer: Self-pay | Admitting: Internal Medicine

## 2024-05-15 VITALS — BP 110/60 | HR 58 | Temp 98.8°F | Ht 65.0 in | Wt 153.0 lb

## 2024-05-15 DIAGNOSIS — N390 Urinary tract infection, site not specified: Secondary | ICD-10-CM | POA: Diagnosis not present

## 2024-05-15 DIAGNOSIS — E119 Type 2 diabetes mellitus without complications: Secondary | ICD-10-CM

## 2024-05-15 DIAGNOSIS — N1831 Chronic kidney disease, stage 3a: Secondary | ICD-10-CM

## 2024-05-15 LAB — URINALYSIS, ROUTINE W REFLEX MICROSCOPIC
Bilirubin Urine: NEGATIVE
Ketones, ur: NEGATIVE
Nitrite: POSITIVE — AB
Specific Gravity, Urine: 1.01 (ref 1.000–1.030)
Total Protein, Urine: 100 — AB
Urine Glucose: NEGATIVE
Urobilinogen, UA: 0.2 (ref 0.0–1.0)
pH: 7.5 (ref 5.0–8.0)

## 2024-05-15 MED ORDER — FLUCONAZOLE 150 MG PO TABS: 150.0000 mg | ORAL_TABLET | Freq: Once | ORAL | 1 refills | Status: AC

## 2024-05-15 MED ORDER — CEFUROXIME AXETIL 250 MG PO TABS: 250.0000 mg | ORAL_TABLET | Freq: Two times a day (BID) | ORAL | 1 refills | Status: DC

## 2024-05-15 NOTE — Assessment & Plan Note (Signed)
 Hydrate well

## 2024-05-15 NOTE — Assessment & Plan Note (Addendum)
 Possible pyelo on the L UA Ceftin  250 mg bid x 7 d Diflucan  150 mg

## 2024-05-15 NOTE — Assessment & Plan Note (Signed)
 Diet controlled.

## 2024-05-15 NOTE — Progress Notes (Signed)
 Subjective:  Patient ID: Gloria [name], female    DOB: December 05, 1951  Age: 73 y.o. MRN: [id number]  CC: Flank Pain (Pt states she has been having left sided flank pain, chills, urinary frequency and urgency a/w mild irritation x2 days.)   HPI Gloria [name] presents for chills since last night, frequency and urgency all night. C/o LBP on the L, arthralgia. Pt has MG  Outpatient Medications Prior to Visit  Medication Sig Dispense Refill   acetaminophen  (TYLENOL ) 500 MG tablet Take 500 mg by mouth every 6 (six) hours as needed for moderate pain or mild pain.     amLODipine  (NORVASC ) 5 MG tablet TAKE 1 TABLET(5 MG) BY MOUTH DAILY 90 tablet 2   Ascorbic Acid (VITAMIN C PO) Take 1,000 mg by mouth daily at 6 (six) AM.     atorvastatin  (LIPITOR) 10 MG tablet TAKE 1 TABLET BY MOUTH DAILY AT 6 PM 90 tablet 2   BIOTIN 5000 PO Take 10,000 mcg by mouth daily. Hair, skin, Nails     Blood Pressure Monitoring (BLOOD PRESSURE MONITOR AUTOMAT) DEVI 1 kit by Does not apply route as directed. Please dispense blood pressure kit covered by insurance 1 each 0   CALCIUM -MAGNESIUM-ZINC PO Take 1 tablet by mouth daily at 6 (six) AM.     celecoxib  (CELEBREX ) 200 MG capsule TAKE 1 CAPSULE(200 MG) BY MOUTH TWICE DAILY BETWEEN MEALS AS NEEDED 60 capsule 1   Cholecalciferol  (VITAMIN D ) 50 MCG (2000 UT) CAPS Take 2,000 Units by mouth daily.     clobetasol  (TEMOVATE ) 0.05 % external solution APPLY TOPICALLY TO THE AFFECTED AREA EVERY DAY FOR 6 WEEKS 50 mL 2   Cyanocobalamin  (VITAMIN B 12 PO) Take 3,000 mcg by mouth daily at 6 (six) AM.     diclofenac  Sodium (VOLTAREN ) 1 % GEL Apply 4 g topically 4 (four) times daily. (Patient taking differently: Apply 4 g topically daily as needed (pain).) 100 g 5   Ferrous Sulfate  Dried (SLOW RELEASE IRON) 45 MG TBCR Take 1 tablet by mouth daily.     levothyroxine  (SYNTHROID ) 100 MCG tablet TAKE ONE TABLET BY MOUTH EVERY DAY 30 MINUTES PRIOR TO BREAKFAST 6 DAYS A WEK AND 1/2 TABLET ONE  DAY A WEEK 90 tablet 1   mirabegron  ER (MYRBETRIQ ) 25 MG TB24 tablet Take 1 tablet (25 mg total) by mouth daily. 90 tablet 1   Multiple Vitamins-Minerals (WOMENS 50+ MULTI VITAMIN PO) Take 1 tablet by mouth daily at 6 (six) AM.     mupirocin  ointment (BACTROBAN ) 2 % APPLY TOPICALLY TO THE AFFECTED AREA DAILY AS NEEDED 30 g 0   Omega-3 1000 MG CAPS Take 1,000 mg by mouth daily.     Polyethyl Glycol-Propyl Glycol (SYSTANE) 0.4-0.3 % SOLN Place 1 drop into both eyes daily as needed (dry eyes).     telmisartan  (MICARDIS ) 80 MG tablet TAKE 1 TABLET(80 MG) BY MOUTH DAILY 90 tablet 1   triamcinolone  cream (KENALOG ) 0.1 % APPLY TOPICALLY TO THE AFFECTED AREA TWICE DAILY 30 g 0   nitrofurantoin , macrocrystal-monohydrate, (MACROBID ) 100 MG capsule Take 1 capsule (100 mg total) by mouth 2 (two) times daily. 14 capsule 0   aspirin  81 MG chewable tablet Chew 1 tablet (81 mg total) by mouth 2 (two) times daily. (Patient not taking: Reported on 01/05/2024) 30 tablet 0   No facility-administered medications prior to visit.    ROS: Review of Systems  Constitutional:  Positive for fatigue. Negative for activity change, appetite change, chills and  unexpected weight change.  HENT:  Negative for congestion, mouth sores and sinus pressure.   Eyes:  Negative for visual disturbance.  Respiratory:  Negative for cough and chest tightness.   Gastrointestinal:  Negative for abdominal pain and nausea.  Genitourinary:  Positive for difficulty urinating, dysuria, frequency and urgency. Negative for vaginal pain.  Musculoskeletal:  Positive for arthralgias. Negative for back pain and gait problem.  Skin:  Negative for pallor and rash.  Neurological:  Negative for dizziness, tremors, weakness, numbness and headaches.  Psychiatric/Behavioral:  Negative for confusion and sleep disturbance.     Objective:  BP 110/60   Pulse (!) 58   Temp 98.8 F (37.1 C) (Oral)   Ht 5\' 5"  (1.651 m)   Wt 153 lb (69.4 kg)   SpO2 92%    BMI 25.46 kg/m   BP Readings from Last 3 Encounters:  05/15/24 110/60  02/05/24 (!) 174/90  02/02/24 134/76    Wt Readings from Last 3 Encounters:  05/15/24 153 lb (69.4 kg)  02/02/24 154 lb (69.9 kg)  01/05/24 154 lb 3.2 oz (69.9 kg)    Physical Exam Constitutional:      General: She is not in acute distress.    Appearance: Normal appearance. She is well-developed.  HENT:     Head: Normocephalic.     Right Ear: External ear normal.     Left Ear: External ear normal.     Nose: Nose normal.  Eyes:     General:        Right eye: No discharge.        Left eye: No discharge.     Conjunctiva/sclera: Conjunctivae normal.     Pupils: Pupils are equal, round, and reactive to light.  Neck:     Thyroid : No thyromegaly.     Vascular: No JVD.     Trachea: No tracheal deviation.  Cardiovascular:     Rate and Rhythm: Normal rate and regular rhythm.     Heart sounds: Normal heart sounds.  Pulmonary:     Effort: No respiratory distress.     Breath sounds: No stridor. No wheezing.  Abdominal:     General: Bowel sounds are normal. There is no distension.     Palpations: Abdomen is soft. There is no mass.     Tenderness: There is no abdominal tenderness. There is no guarding or rebound.  Musculoskeletal:        General: No tenderness.     Cervical back: Normal range of motion and neck supple. No rigidity.  Lymphadenopathy:     Cervical: No cervical adenopathy.  Skin:    Findings: No erythema or rash.  Neurological:     Cranial Nerves: No cranial nerve deficit.     Motor: No abnormal muscle tone.     Coordination: Coordination normal.     Deep Tendon Reflexes: Reflexes normal.  Psychiatric:        Behavior: Behavior normal.        Thought Content: Thought content normal.        Judgment: Judgment normal.     Lab Results  Component Value Date   WBC 4.1 02/02/2024   HGB 12.4 02/02/2024   HCT 37.8 02/02/2024   PLT 177.0 02/02/2024   GLUCOSE 81 02/02/2024   CHOL 154  02/02/2024   TRIG 111.0 02/02/2024   HDL 53.30 02/02/2024   LDLCALC 78 02/02/2024   ALT 13 02/02/2024   AST 23 02/02/2024   NA 138 02/02/2024   K  4.3 02/02/2024   CL 102 02/02/2024   CREATININE 0.99 02/02/2024   BUN 15 02/02/2024   CO2 33 (H) 02/02/2024   TSH 1.47 02/02/2024   HGBA1C 5.9 02/02/2024   MICROALBUR 1.3 04/05/2024    DG Knee Right Port Result Date: 07/14/2023 CLINICAL DATA:  Right knee replacement. EXAM: PORTABLE RIGHT KNEE - 1-2 VIEW COMPARISON:  None Available. FINDINGS: Right knee arthroplasty in expected alignment. No periprosthetic lucency or fracture. Recent postsurgical change includes air and edema in the soft tissues and joint space. Anterior skin staples in place. IMPRESSION: Right knee arthroplasty without immediate postoperative complication. Electronically Signed   By: Chadwick Colonel M.D.   On: 07/14/2023 17:18    Assessment & Plan:   Problem List Items Addressed This Visit     Type 2 diabetes, diet controlled (HCC)   Diet controlled      CKD (chronic kidney disease) stage 3, GFR 30-59 ml/min (HCC)   Hydrate well      UTI (urinary tract infection) - Primary   Possible pyelo on the L UA Ceftin  250 mg bid x 7 d Diflucan  150 mg      Relevant Medications   cefUROXime  (CEFTIN ) 250 MG tablet   fluconazole  (DIFLUCAN ) 150 MG tablet   Other Relevant Orders   Urinalysis      Meds ordered this encounter  Medications   cefUROXime  (CEFTIN ) 250 MG tablet    Sig: Take 1 tablet (250 mg total) by mouth 2 (two) times daily with a meal.    Dispense:  14 tablet    Refill:  1   fluconazole  (DIFLUCAN ) 150 MG tablet    Sig: Take 1 tablet (150 mg total) by mouth once for 1 dose.    Dispense:  1 tablet    Refill:  1      Follow-up: Return for f/u with PCP.  Anitra Barn, MD

## 2024-05-15 NOTE — Telephone Encounter (Signed)
 Copied from CRM [id number]. Topic: Clinical - Red Word Triage >> May 15, 2024  8:22 AM Howard Macho wrote: Reason for CRM: possible UTI, chills, frequent urination and feels terrible Reason for Disposition  Age > 50 years  Answer Assessment - Initial Assessment Questions 1. SEVERITY: "How bad is the pain?"  (e.g., Scale 1-10; mild, moderate, or severe)   - MILD (1-3): complains slightly about urination hurting   - MODERATE (4-7): interferes with normal activities     - SEVERE (8-10): excruciating, unwilling or unable to urinate because of the pain      I'm peeing a lot.  Started last night.   I got chilled then having to go pee frequently.   I could not hold my urine at all.   Happened all night.   This morning I feel terrible.   I think I have a UTI.   I've them before.  2. FREQUENCY: "How many times have you had painful urination today?"      Yes 3. PATTERN: "Is pain present every time you urinate or just sometimes?"      Just a discomfort not really a burning. 4. ONSET: "When did the painful urination start?"      Last night 5. FEVER: "Do you have a fever?" If Yes, ask: "What is your temperature, how was it measured, and when did it start?"     I'm having chills 6. PAST UTI: "Have you had a urine infection before?" If Yes, ask: "When was the last time?" and "What happened that time?"      Yes 7. CAUSE: "What do you think is causing the painful urination?"  (e.g., UTI, scratch, Herpes sore)     UTI  8. OTHER SYMPTOMS: "Do you have any other symptoms?" (e.g., blood in urine, flank pain, genital sores, urgency, vaginal discharge)     Back pain but I always have back pain.   I have a light irritation down there. 9. PREGNANCY: "Is there any chance you are pregnant?" "When was your last menstrual period?"     N/A due to age  Protocols used: Urination Pain - Female-A-AH  Chief Complaint: UTI Symptoms: Frequency, chills, feels bad, mild discomfort, burning with urination Frequency: Started last  night Pertinent Negatives: Patient denies blood in urine Disposition: [] ED /[] Urgent Care (no appt availability in office) / [x] Appointment(In office/virtual)/ []  [place] [facility]/ [] Home Care/ [] Refused Recommended Disposition /[] [place] [facility]/ []  Follow-up with PCP Additional Notes: Appt made for this morning with Dr. Georgia Kipper for 11:00.

## 2024-05-19 ENCOUNTER — Ambulatory Visit: Payer: Self-pay | Admitting: Internal Medicine

## 2024-05-22 ENCOUNTER — Ambulatory Visit (INDEPENDENT_AMBULATORY_CARE_PROVIDER_SITE_OTHER): Admitting: Orthopaedic Surgery

## 2024-05-22 DIAGNOSIS — G8929 Other chronic pain: Secondary | ICD-10-CM

## 2024-05-22 DIAGNOSIS — M25562 Pain in left knee: Secondary | ICD-10-CM | POA: Diagnosis not present

## 2024-05-22 DIAGNOSIS — Z96651 Presence of right artificial knee joint: Secondary | ICD-10-CM

## 2024-05-22 MED ORDER — LIDOCAINE HCL 1 % IJ SOLN
3.0000 mL | INTRAMUSCULAR | Status: AC | PRN
  Administered 2024-05-22: 3 mL

## 2024-05-22 MED ORDER — METHYLPREDNISOLONE ACETATE 40 MG/ML IJ SUSP
40.0000 mg | INTRAMUSCULAR | Status: AC | PRN
  Administered 2024-05-22: 40 mg via INTRA_ARTICULAR

## 2024-05-22 NOTE — Progress Notes (Signed)
 The patient is a 73 year old female well-known to me.  We replaced her right knee back in August of last year and she is about is doing much better overall.  She has been having some left knee pain and is requesting a steroid injection in the left knee today.  Her right operative knee has full range of motion.  He is ligamentously stable.  The left knee has no effusion with good alignment and good range of motion but it is painful throughout arc of motion.  Per request I did place a steroid injection in her left knee that she tolerated very well.  At this point she can actually follow-up as needed for both knees.  If she does develop issues with her right total knee replacement or her left knee is not getting better she knows to reach out and let us  know.  She has been a long-term patient of ours.    Procedure Note  Patient: Gloria [name]             Date of Birth: 06-14-1951           MRN: [id number]             Visit Date: 05/22/2024  Procedures: Visit Diagnoses:  1. History of total right knee replacement   2. Chronic pain of left knee     Large Joint Inj: L knee on 05/22/2024 2:05 PM Indications: diagnostic evaluation and pain Details: 22 G 1.5 in needle, superolateral approach  Arthrogram: No  Medications: 3 mL lidocaine  1 %; 40 mg methylPREDNISolone  acetate 40 MG/ML Outcome: tolerated well, no immediate complications Procedure, treatment alternatives, risks and benefits explained, specific risks discussed. Consent was given by the patient. Immediately prior to procedure a time out was called to verify the correct patient, procedure, equipment, support staff and site/side marked as required. Patient was prepped and draped in the usual sterile fashion.

## 2024-05-27 ENCOUNTER — Ambulatory Visit
Admission: RE | Admit: 2024-05-27 | Discharge: 2024-05-27 | Disposition: A | Discharge status: Home / Self Care | Source: Ambulatory Visit | Attending: Internal Medicine | Admitting: Internal Medicine

## 2024-05-27 DIAGNOSIS — Z1231 Encounter for screening mammogram for malignant neoplasm of breast: Secondary | ICD-10-CM | POA: Diagnosis not present

## 2024-05-27 NOTE — Progress Notes (Signed)
 I was able to speak with Pt. Which stated completed the antibiotics last Tuesday and that she feels better.

## 2024-06-01 ENCOUNTER — Other Ambulatory Visit: Payer: Self-pay | Admitting: Internal Medicine

## 2024-06-01 DIAGNOSIS — N3281 Overactive bladder: Secondary | ICD-10-CM

## 2024-06-03 ENCOUNTER — Ambulatory Visit: Payer: Self-pay

## 2024-06-03 NOTE — Telephone Encounter (Signed)
 Copied from CRM [id number]. Topic: Clinical - Red Word Triage >> Jun 03, 2024 12:26 PM Franky GRADE wrote: Red Word that prompted transfer to Nurse Triage: Patient is experiencing an overactive bladder after a UTI. She has been using the restroom frequently for the last couple of days.    Reason for Disposition  Urinating more frequently than usual (i.e., frequency)  Answer Assessment - Initial Assessment Questions 1. SYMPTOM: What's the main symptom you're concerned about? (e.g., frequency, incontinence)     Increased frequency  2. ONSET: When did the increased frequency start?     2-3 days  3. PAIN: Is there any pain? If Yes, ask: How bad is it? (Scale: 1-10; mild, moderate, severe)     No 4. CAUSE: What do you think is causing the symptoms?     Unsure  5. OTHER SYMPTOMS: Do you have any other symptoms? (e.g., blood in urine, fever, flank pain, pain with urination)     No  Protocols used: Urinary Symptoms-A-AH    FYI Only or Action Required?: FYI only for provider.  Patient was last seen in primary care on 05/15/2024 by Plotnikov, Karlynn GAILS, MD. Called Nurse Triage reporting Urinary Frequency. Symptoms began several days ago. Interventions attempted: Nothing. Symptoms are: unchanged.  Triage Disposition: See Physician Within 24 Hours (Appointment 6/25 per patient's request/availability)  Patient/caregiver understands and will follow disposition?: Yes

## 2024-06-05 ENCOUNTER — Encounter: Payer: Self-pay | Admitting: Internal Medicine

## 2024-06-05 ENCOUNTER — Ambulatory Visit (INDEPENDENT_AMBULATORY_CARE_PROVIDER_SITE_OTHER): Admitting: Internal Medicine

## 2024-06-05 ENCOUNTER — Other Ambulatory Visit: Payer: Self-pay

## 2024-06-05 VITALS — BP 130/78 | HR 80 | Temp 98.0°F | Ht 65.0 in | Wt 150.0 lb

## 2024-06-05 DIAGNOSIS — N3000 Acute cystitis without hematuria: Secondary | ICD-10-CM | POA: Diagnosis not present

## 2024-06-05 DIAGNOSIS — R350 Frequency of micturition: Secondary | ICD-10-CM | POA: Diagnosis not present

## 2024-06-05 LAB — POC URINALSYSI DIPSTICK (AUTOMATED)
Bilirubin, UA: NEGATIVE
Glucose, UA: NEGATIVE
Ketones, UA: NEGATIVE
Nitrite, UA: NEGATIVE
Protein, UA: POSITIVE — AB
Spec Grav, UA: 1.015 (ref 1.010–1.025)
Urobilinogen, UA: 0.2 U/dL
pH, UA: 7 (ref 5.0–8.0)

## 2024-06-05 MED ORDER — NITROFURANTOIN MONOHYD MACRO 100 MG PO CAPS: 100.0000 mg | ORAL_CAPSULE | Freq: Two times a day (BID) | ORAL | 0 refills | Status: DC

## 2024-06-05 NOTE — Progress Notes (Signed)
 Subjective:    Patient ID: Gloria [name], female    DOB: 1951/01/04, 73 y.o.   MRN: [id number]      HPI Gloria [name] is here for  Chief Complaint  Patient presents with   Urinary Frequency    Pressure and frequency    She was here 6/4 for UTI.  She was treated with cefuroxime  and fluconazole  - she did not need to take the fluconazole .  She thinks the symptoms went away but shortly thereafter started to have symptoms again.  She states bladder pressure, dysuria, urinary frequency, urinary urgency.  She denies dysuria, hematuria, fever, nausea, abd pain and back pain.    UTI 11/2023-enterococci  Medications and allergies reviewed with patient and updated if appropriate.  Current Outpatient Medications on File Prior to Visit  Medication Sig Dispense Refill   acetaminophen  (TYLENOL ) 500 MG tablet Take 500 mg by mouth every 6 (six) hours as needed for moderate pain or mild pain.     amLODipine  (NORVASC ) 5 MG tablet TAKE 1 TABLET(5 MG) BY MOUTH DAILY 90 tablet 2   Ascorbic Acid (VITAMIN C PO) Take 1,000 mg by mouth daily at 6 (six) AM.     aspirin  81 MG chewable tablet Chew 1 tablet (81 mg total) by mouth 2 (two) times daily. 30 tablet 0   atorvastatin  (LIPITOR) 10 MG tablet TAKE 1 TABLET BY MOUTH DAILY AT 6 PM 90 tablet 2   BIOTIN 5000 PO Take 10,000 mcg by mouth daily. Hair, skin, Nails     Blood Pressure Monitoring (BLOOD PRESSURE MONITOR AUTOMAT) DEVI 1 kit by Does not apply route as directed. Please dispense blood pressure kit covered by insurance 1 each 0   CALCIUM -MAGNESIUM-ZINC PO Take 1 tablet by mouth daily at 6 (six) AM.     cefUROXime  (CEFTIN ) 250 MG tablet Take 1 tablet (250 mg total) by mouth 2 (two) times daily with a meal. 14 tablet 1   celecoxib  (CELEBREX ) 200 MG capsule TAKE 1 CAPSULE(200 MG) BY MOUTH TWICE DAILY BETWEEN MEALS AS NEEDED 60 capsule 1   Cholecalciferol  (VITAMIN D ) 50 MCG (2000 UT) CAPS Take 2,000 Units by mouth daily.     clobetasol  (TEMOVATE ) 0.05  % external solution APPLY TOPICALLY TO THE AFFECTED AREA EVERY DAY FOR 6 WEEKS 50 mL 2   Cyanocobalamin  (VITAMIN B 12 PO) Take 3,000 mcg by mouth daily at 6 (six) AM.     diclofenac  Sodium (VOLTAREN ) 1 % GEL Apply 4 g topically 4 (four) times daily. (Patient taking differently: Apply 4 g topically daily as needed (pain).) 100 g 5   Ferrous Sulfate  Dried (SLOW RELEASE IRON) 45 MG TBCR Take 1 tablet by mouth daily.     levothyroxine  (SYNTHROID ) 100 MCG tablet TAKE ONE TABLET BY MOUTH EVERY DAY 30 MINUTES PRIOR TO BREAKFAST 6 DAYS A WEK AND 1/2 TABLET ONE DAY A WEEK 90 tablet 1   Multiple Vitamins-Minerals (WOMENS 50+ MULTI VITAMIN PO) Take 1 tablet by mouth daily at 6 (six) AM.     mupirocin  ointment (BACTROBAN ) 2 % APPLY TOPICALLY TO THE AFFECTED AREA DAILY AS NEEDED 30 g 0   MYRBETRIQ  25 MG TB24 tablet TAKE 1 TABLET(25 MG) BY MOUTH DAILY 90 tablet 1   MYRBETRIQ  25 MG TB24 tablet TAKE 1 TABLET(25 MG) BY MOUTH DAILY 90 tablet 1   Omega-3 1000 MG CAPS Take 1,000 mg by mouth daily.     Polyethyl Glycol-Propyl Glycol (SYSTANE) 0.4-0.3 % SOLN Place 1 drop into both eyes  daily as needed (dry eyes).     telmisartan  (MICARDIS ) 80 MG tablet TAKE 1 TABLET(80 MG) BY MOUTH DAILY 90 tablet 1   triamcinolone  cream (KENALOG ) 0.1 % APPLY TOPICALLY TO THE AFFECTED AREA TWICE DAILY 30 g 0   No current facility-administered medications on file prior to visit.    Review of Systems     Objective:   Vitals:   06/05/24 1038  BP: 130/78  Pulse: 80  Temp: 98 F (36.7 C)  SpO2: 97%   BP Readings from Last 3 Encounters:  06/05/24 130/78  05/15/24 110/60  02/05/24 (!) 174/90   Wt Readings from Last 3 Encounters:  06/05/24 150 lb (68 kg)  05/15/24 153 lb (69.4 kg)  02/02/24 154 lb (69.9 kg)   Body mass index is 24.96 kg/m.    Physical Exam Constitutional:      General: She is not in acute distress.    Appearance: Normal appearance. She is not ill-appearing.  HENT:     Head: Normocephalic.    Eyes:     Conjunctiva/sclera: Conjunctivae normal.   Abdominal:     General: There is no distension.     Palpations: Abdomen is soft.     Tenderness: There is no abdominal tenderness. There is no right CVA tenderness, left CVA tenderness, guarding or rebound.   Skin:    General: Skin is warm and dry.   Neurological:     Mental Status: She is alert.            Assessment & Plan:    See Problem List for Assessment and Plan of chronic medical problems.

## 2024-06-05 NOTE — Assessment & Plan Note (Signed)
 Subacute Likely incompletely treated UTI Urine dip consistent with UTI Will send urine for culture Take nitrofurantoin  twice daily x 7 days Take tylenol  if needed.   Increase your water  intake.  Call if no improvement

## 2024-06-05 NOTE — Addendum Note (Signed)
 Addended by: CLAUDENE TOBIAS PARAS on: 06/05/2024 01:17 PM   Modules accepted: Orders

## 2024-06-05 NOTE — Patient Instructions (Addendum)
 You have a UTI.     Medications changes include :   nitrofurantoin  twice daily x 7 days     Return if symptoms worsen or fail to improve.    Urinary Tract Infection, Female A urinary tract infection (UTI) is an infection in your urinary tract. The urinary tract is made up of organs that make, store, and get rid of pee (urine) in your body. These organs include: The kidneys. The ureters. The bladder. The urethra. What are the causes? Most UTIs are caused by germs called bacteria. They may be in or near your genitals. These germs grow and cause swelling in your urinary tract. What increases the risk? You're more likely to get a UTI if: You're a female. The urethra is shorter in females than in males. You have a soft tube called a catheter that drains your pee. You can't control when you pee or poop. You have trouble peeing because of: A kidney stone. A urinary blockage. A nerve condition that affects your bladder. Not getting enough to drink. You're sexually active. You use a birth control inside your vagina, like spermicide. You're pregnant. You have low levels of the hormone estrogen in your body. You're an older adult. You're also more likely to get a UTI if you have other health problems. These may include: Diabetes. A weak immune system. Your immune system is your body's defense system. Sickle cell disease. Injury of the spine. What are the signs or symptoms? Symptoms may include: Needing to pee right away. Peeing small amounts often. Pain or burning when you pee. Blood in your pee. Pee that smells bad or odd. Pain in your belly or lower back. You may also: Feel confused. This may be the first symptom in older adults. Vomit. Not feel hungry. Feel tired or easily annoyed. Have a fever or chills. How is this diagnosed? A UTI is diagnosed based on your medical history and an exam. You may also have other tests. These may include: Pee tests. Blood  tests. Tests for sexually transmitted infections (STIs). If you've had more than one UTI, you may need to have imaging studies done to find out why you keep getting them. How is this treated? A UTI can be treated by: Taking antibiotics or other medicines. Drinking enough fluid to keep your pee pale yellow. In rare cases, a UTI can cause a very bad condition called sepsis. Sepsis may be treated in the hospital. Follow these instructions at home: Medicines Take your medicines only as told by your health care provider. If you were given antibiotics, take them as told by your provider. Do not stop taking them even if you start to feel better. General instructions Make sure you: Pee often and fully. Do not hold your pee for a long time. Wipe from front to back after you pee or poop. Use each tissue only once when you wipe. Pee after you have sex. Do not douche or use sprays or powders in your genital area. Contact a health care provider if: Your symptoms don't get better after 1-2 days of taking antibiotics. Your symptoms go away and then come back. You have a fever or chills. You vomit or feel like you may vomit. Get help right away if: You have very bad pain in your back or lower belly. You faint. This information is not intended to replace advice given to you by your health care provider. Make sure you discuss any questions you have with your health care provider.  Document Revised: 11/08/2023 Document Reviewed: 03/03/2023 Elsevier Patient Education  2025 [employer].

## 2024-06-07 ENCOUNTER — Ambulatory Visit: Payer: Self-pay | Admitting: Internal Medicine

## 2024-06-07 LAB — CULTURE, URINE COMPREHENSIVE

## 2024-06-24 ENCOUNTER — Ambulatory Visit: Payer: No Typology Code available for payment source | Admitting: Orthopaedic Surgery

## 2024-07-17 ENCOUNTER — Other Ambulatory Visit: Payer: Self-pay | Admitting: Internal Medicine

## 2024-07-17 MED ORDER — TELMISARTAN 80 MG PO TABS: 80.0000 mg | ORAL_TABLET | Freq: Every day | ORAL | 1 refills | Status: DC

## 2024-07-17 NOTE — Telephone Encounter (Signed)
 Copied from CRM [id number]. Topic: Clinical - Medication Refill >> Jul 17, 2024  9:16 AM Tanazia G wrote: Medication:  telmisartan  (MICARDIS ) 80 MG tablet Patient is asking for a 180 day supply to her pharmacy.  Has the patient contacted their pharmacy? Yes (Agent: If no, request that the patient contact the pharmacy for the refill. If patient does not wish to contact the pharmacy document the reason why and proceed with request.) (Agent: If yes, when and what did the pharmacy advise?)  This is the patient's preferred pharmacy:  [id number] [address], [place] - [address] AT [facility] & [address] [place] [address] Phone: [phone] Fax: [phone]  Is this the correct pharmacy for this prescription? Yes If no, delete pharmacy and type the correct one.   Has the prescription been filled recently? Yes  Is the patient out of the medication? Yes  Has the patient been seen for an appointment in the last year OR does the patient have an upcoming appointment? Yes  Can we respond through MyChart? Yes  Agent: Please be advised that Rx refills may take up to 3 business days. We ask that you follow-up with your pharmacy.

## 2024-08-02 ENCOUNTER — Ambulatory Visit: Admitting: Internal Medicine

## 2024-08-06 NOTE — Progress Notes (Unsigned)
 Subjective:    Patient ID: Gloria [name], female    DOB: 07-Aug-1951, 73 y.o.   MRN: [id number]      HPI [name] is here for a Physical exam and her chronic medical problems.   Lower back has been hurting a lot.  She has had injections in the past which did help temporarily.  She was told at her last visit that surgery was likely the only treatment.  She is exercising regularly.   Medications and allergies reviewed with patient and updated if appropriate.  Current Outpatient Medications on File Prior to Visit  Medication Sig Dispense Refill   acetaminophen  (TYLENOL ) 500 MG tablet Take 500 mg by mouth every 6 (six) hours as needed for moderate pain or mild pain.     amLODipine  (NORVASC ) 5 MG tablet TAKE 1 TABLET(5 MG) BY MOUTH DAILY 90 tablet 2   Ascorbic Acid (VITAMIN C PO) Take 1,000 mg by mouth daily at 6 (six) AM.     aspirin  81 MG chewable tablet Chew 1 tablet (81 mg total) by mouth 2 (two) times daily. 30 tablet 0   atorvastatin  (LIPITOR) 10 MG tablet TAKE 1 TABLET BY MOUTH DAILY AT 6 PM 90 tablet 2   BIOTIN 5000 PO Take 10,000 mcg by mouth daily. Hair, skin, Nails     Blood Pressure Monitoring (BLOOD PRESSURE MONITOR AUTOMAT) DEVI 1 kit by Does not apply route as directed. Please dispense blood pressure kit covered by insurance 1 each 0   CALCIUM -MAGNESIUM-ZINC PO Take 1 tablet by mouth daily at 6 (six) AM.     celecoxib  (CELEBREX ) 200 MG capsule TAKE 1 CAPSULE(200 MG) BY MOUTH TWICE DAILY BETWEEN MEALS AS NEEDED 60 capsule 1   Cholecalciferol  (VITAMIN D ) 50 MCG (2000 UT) CAPS Take 2,000 Units by mouth daily.     clobetasol  (TEMOVATE ) 0.05 % external solution APPLY TOPICALLY TO THE AFFECTED AREA EVERY DAY FOR 6 WEEKS 50 mL 2   Cyanocobalamin  (VITAMIN B 12 PO) Take 3,000 mcg by mouth daily at 6 (six) AM.     diclofenac  Sodium (VOLTAREN ) 1 % GEL Apply 4 g topically 4 (four) times daily. (Patient taking differently: Apply 4 g topically daily as needed (pain).) 100 g 5   Ferrous  Sulfate Dried (SLOW RELEASE IRON) 45 MG TBCR Take 1 tablet by mouth daily.     levothyroxine  (SYNTHROID ) 100 MCG tablet TAKE ONE TABLET BY MOUTH EVERY DAY 30 MINUTES PRIOR TO BREAKFAST 6 DAYS A WEK AND 1/2 TABLET ONE DAY A WEEK 90 tablet 1   Multiple Vitamins-Minerals (WOMENS 50+ MULTI VITAMIN PO) Take 1 tablet by mouth daily at 6 (six) AM.     mupirocin  ointment (BACTROBAN ) 2 % APPLY TOPICALLY TO THE AFFECTED AREA DAILY AS NEEDED 30 g 0   Omega-3 1000 MG CAPS Take 1,000 mg by mouth daily.     Polyethyl Glycol-Propyl Glycol (SYSTANE) 0.4-0.3 % SOLN Place 1 drop into both eyes daily as needed (dry eyes).     telmisartan  (MICARDIS ) 80 MG tablet Take 1 tablet (80 mg total) by mouth daily. 90 tablet 1   triamcinolone  cream (KENALOG ) 0.1 % APPLY TOPICALLY TO THE AFFECTED AREA TWICE DAILY 30 g 0   No current facility-administered medications on file prior to visit.    Review of Systems  Constitutional:  Negative for fever.  Eyes:  Positive for visual disturbance.  Respiratory:  Negative for cough, shortness of breath and wheezing.   Cardiovascular:  Negative for chest pain, palpitations  and leg swelling.  Gastrointestinal:  Negative for abdominal pain, blood in stool, constipation and diarrhea.       No gerd  Genitourinary:  Positive for frequency (first hour she is up she goes 5-6 times). Negative for dysuria.       No incontinence  Musculoskeletal:  Positive for arthralgias (left knee, right shoulder,left lateral ankle) and back pain.  Skin:  Negative for rash.  Neurological:  Negative for light-headedness and headaches.  Psychiatric/Behavioral:  Negative for dysphoric mood. The patient is not nervous/anxious.        Objective:   Vitals:   08/07/24 1444  BP: 114/78  Pulse: 60  Temp: 98.2 F (36.8 C)  SpO2: 97%   Filed Weights   08/07/24 1444  Weight: 150 lb (68 kg)   Body mass index is 24.96 kg/m.  BP Readings from Last 3 Encounters:  08/07/24 114/78  06/05/24 130/78   05/15/24 110/60    Wt Readings from Last 3 Encounters:  08/07/24 150 lb (68 kg)  06/05/24 150 lb (68 kg)  05/15/24 153 lb (69.4 kg)       Physical Exam Constitutional: She appears well-developed and well-nourished. No distress.  HENT:  Head: Normocephalic and atraumatic.  Right Ear: External ear normal. Normal ear canal and TM Left Ear: External ear normal.  Normal ear canal and TM Mouth/Throat: Oropharynx is clear and moist.  Eyes: Conjunctivae normal.  Neck: Neck supple. No tracheal deviation present. No thyromegaly present.  No carotid bruit  Cardiovascular: Normal rate, regular rhythm and normal heart sounds.   No murmur heard.  No edema. Pulmonary/Chest: Effort normal and breath sounds normal. No respiratory distress. She has no wheezes. She has no rales.  Breast: deferred   Abdominal: Soft. She exhibits no distension. There is no tenderness.  Lymphadenopathy: She has no cervical adenopathy.  Skin: Skin is warm and dry. She is not diaphoretic.  Psychiatric: She has a normal mood and affect. Her behavior is normal.     Lab Results  Component Value Date   WBC 4.1 02/02/2024   HGB 12.4 02/02/2024   HCT 37.8 02/02/2024   PLT 177.0 02/02/2024   GLUCOSE 81 02/02/2024   CHOL 154 02/02/2024   TRIG 111.0 02/02/2024   HDL 53.30 02/02/2024   LDLCALC 78 02/02/2024   ALT 13 02/02/2024   AST 23 02/02/2024   NA 138 02/02/2024   K 4.3 02/02/2024   CL 102 02/02/2024   CREATININE 0.99 02/02/2024   BUN 15 02/02/2024   CO2 33 (H) 02/02/2024   TSH 1.47 02/02/2024   HGBA1C 5.9 02/02/2024   MICROALBUR 1.3 04/05/2024         Assessment & Plan:   Physical exam: Screening blood work  ordered Exercise  is regular, pickleball Weight  is normal Substance abuse  none   Reviewed recommended immunizations.   Health Maintenance  Topic Date Due   FOOT EXAM  01/24/2019   Pneumococcal Vaccine: 50+ Years (3 of 3 - PCV20 or PCV21) 03/02/2020   OPHTHALMOLOGY EXAM   03/05/2024   COVID-19 Vaccine (7 - Pfizer risk 2024-25 season) 05/18/2024   HEMOGLOBIN A1C  08/01/2024   INFLUENZA VACCINE  03/11/2025 (Originally 07/12/2024)   Medicare Annual Wellness (AWV)  01/04/2025   Diabetic kidney evaluation - eGFR measurement  02/01/2025   Diabetic kidney evaluation - Urine ACR  04/05/2025   MAMMOGRAM  05/27/2025   DEXA SCAN  09/10/2027   Colonoscopy  01/14/2032   DTaP/Tdap/Td (3 - Td or Tdap)  10/26/2032   Hepatitis C Screening  Completed   HPV VACCINES  Aged Out   Meningococcal B Vaccine  Aged Out   Zoster Vaccines- Shingrix  Discontinued      See Problem List for Assessment and Plan of chronic medical problems.

## 2024-08-06 NOTE — Patient Instructions (Addendum)

## 2024-08-07 ENCOUNTER — Ambulatory Visit: Payer: Self-pay | Admitting: Internal Medicine

## 2024-08-07 ENCOUNTER — Telehealth: Payer: Self-pay

## 2024-08-07 ENCOUNTER — Ambulatory Visit (INDEPENDENT_AMBULATORY_CARE_PROVIDER_SITE_OTHER): Admitting: Internal Medicine

## 2024-08-07 ENCOUNTER — Encounter: Payer: Self-pay | Admitting: Internal Medicine

## 2024-08-07 VITALS — BP 114/78 | HR 60 | Temp 98.2°F | Ht 65.0 in | Wt 150.0 lb

## 2024-08-07 DIAGNOSIS — G7000 Myasthenia gravis without (acute) exacerbation: Secondary | ICD-10-CM | POA: Diagnosis not present

## 2024-08-07 DIAGNOSIS — E785 Hyperlipidemia, unspecified: Secondary | ICD-10-CM | POA: Diagnosis not present

## 2024-08-07 DIAGNOSIS — I152 Hypertension secondary to endocrine disorders: Secondary | ICD-10-CM | POA: Diagnosis not present

## 2024-08-07 DIAGNOSIS — E1159 Type 2 diabetes mellitus with other circulatory complications: Secondary | ICD-10-CM | POA: Diagnosis not present

## 2024-08-07 DIAGNOSIS — N1831 Chronic kidney disease, stage 3a: Secondary | ICD-10-CM

## 2024-08-07 DIAGNOSIS — E039 Hypothyroidism, unspecified: Secondary | ICD-10-CM | POA: Diagnosis not present

## 2024-08-07 DIAGNOSIS — R350 Frequency of micturition: Secondary | ICD-10-CM | POA: Diagnosis not present

## 2024-08-07 DIAGNOSIS — Z0000 Encounter for general adult medical examination without abnormal findings: Secondary | ICD-10-CM

## 2024-08-07 DIAGNOSIS — I2510 Atherosclerotic heart disease of native coronary artery without angina pectoris: Secondary | ICD-10-CM

## 2024-08-07 DIAGNOSIS — E1169 Type 2 diabetes mellitus with other specified complication: Secondary | ICD-10-CM | POA: Diagnosis not present

## 2024-08-07 DIAGNOSIS — E119 Type 2 diabetes mellitus without complications: Secondary | ICD-10-CM

## 2024-08-07 LAB — CBC WITH DIFFERENTIAL/PLATELET
Basophils Absolute: 0 K/uL (ref 0.0–0.1)
Basophils Relative: 0.4 % (ref 0.0–3.0)
Eosinophils Absolute: 0.2 K/uL (ref 0.0–0.7)
Eosinophils Relative: 3.8 % (ref 0.0–5.0)
HCT: 38 % (ref 36.0–46.0)
Hemoglobin: 12.4 g/dL (ref 12.0–15.0)
Lymphocytes Relative: 23.7 % (ref 12.0–46.0)
Lymphs Abs: 1.4 K/uL (ref 0.7–4.0)
MCHC: 32.6 g/dL (ref 30.0–36.0)
MCV: 93 fl (ref 78.0–100.0)
Monocytes Absolute: 0.6 K/uL (ref 0.1–1.0)
Monocytes Relative: 9.7 % (ref 3.0–12.0)
Neutro Abs: 3.7 K/uL (ref 1.4–7.7)
Neutrophils Relative %: 62.4 % (ref 43.0–77.0)
Platelets: 188 K/uL (ref 150.0–400.0)
RBC: 4.09 Mil/uL (ref 3.87–5.11)
RDW: 14.5 % (ref 11.5–15.5)
WBC: 5.9 K/uL (ref 4.0–10.5)

## 2024-08-07 LAB — COMPREHENSIVE METABOLIC PANEL WITH GFR
ALT: 15 U/L (ref 0–35)
AST: 24 U/L (ref 0–37)
Albumin: 4.4 g/dL (ref 3.5–5.2)
Alkaline Phosphatase: 73 U/L (ref 39–117)
BUN: 22 mg/dL (ref 6–23)
CO2: 30 meq/L (ref 19–32)
Calcium: 10 mg/dL (ref 8.4–10.5)
Chloride: 103 meq/L (ref 96–112)
Creatinine, Ser: 1.06 mg/dL (ref 0.40–1.20)
GFR: 52.29 mL/min — ABNORMAL LOW (ref >60.00)
Glucose, Bld: 77 mg/dL (ref 70–99)
Potassium: 4.2 meq/L (ref 3.5–5.1)
Sodium: 141 meq/L (ref 135–145)
Total Bilirubin: 0.4 mg/dL (ref 0.2–1.2)
Total Protein: 8.3 g/dL (ref 6.0–8.3)

## 2024-08-07 LAB — LIPID PANEL
Cholesterol: 150 mg/dL (ref 0–200)
HDL: 61 mg/dL (ref >39.00)
LDL Cholesterol: 76 mg/dL (ref 0–99)
NonHDL: 89.46
Total CHOL/HDL Ratio: 2
Triglycerides: 65 mg/dL (ref 0.0–149.0)
VLDL: 13 mg/dL (ref 0.0–40.0)

## 2024-08-07 LAB — HEMOGLOBIN A1C: Hgb A1c MFr Bld: 6.1 % (ref 4.6–6.5)

## 2024-08-07 LAB — TSH: TSH: 0.92 u[IU]/mL (ref 0.35–5.50)

## 2024-08-07 LAB — VITAMIN D 25 HYDROXY (VIT D DEFICIENCY, FRACTURES): VITD: 97.51 ng/mL (ref 30.00–100.00)

## 2024-08-07 NOTE — Assessment & Plan Note (Addendum)
 Chronic Rarely takes aleve, take Tylenol  Increase water  intake Continue low-sodium diet Stressed good BP, sugar control CBC, CMP

## 2024-08-07 NOTE — Telephone Encounter (Signed)
 Copied from CRM [id number]. Topic: Clinical - Medication Question >> Aug 07, 2024  4:43 PM Dedra B wrote: Reason for CRM: Pt would like Dr. Geofm or Tobias to give her a call as soon as possible regarding a medication question she has.

## 2024-08-07 NOTE — Assessment & Plan Note (Signed)
 Chronic Worse recently In the morning within the first hour of getting up she finds that she has to go 5-6 times but then after that she is okay Discussed options of seeing a urogynecologist, retrying Myrbetriq  and working on her feeling of needing to go For now she will just monitor and if this does not improve or worsens she will let me know if she wants to try medication or be referred.

## 2024-08-07 NOTE — Assessment & Plan Note (Signed)
Chronic  Clinically euthyroid Check tsh and will titrate med dose if needed Currently taking levothyroxine 100 mcg daily 6 days a week, 50 mcg daily one day a week

## 2024-08-07 NOTE — Assessment & Plan Note (Signed)
 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

## 2024-08-07 NOTE — Assessment & Plan Note (Signed)
Chronic Check lipid panel  Continue atorvastatin 10 mg daily Regular exercise and healthy diet encouraged

## 2024-08-07 NOTE — Assessment & Plan Note (Signed)
Chronic No symptoms c/w angina Continue atorvastatin 10 mg daily, ASA 81 mg daily

## 2024-08-07 NOTE — Assessment & Plan Note (Addendum)
 Chronic Blood pressure well-controlled CBC, CMP Regular exercise, low-sodium diet Continue amlodipine  5 mg daily, telmisartan  80 mg daily

## 2024-08-07 NOTE — Assessment & Plan Note (Signed)
 Chronic  Lab Results  Component Value Date   HGBA1C 5.9 02/02/2024   Sugars controlled Check A1c Continue lifestyle control Stressed regular exercise, diabetic diet

## 2024-08-08 NOTE — Telephone Encounter (Signed)
 Message left for patient today to return phone call to clinic.  If she calls back please see what questions she had regarding medication and which one.  Thanks

## 2024-08-13 ENCOUNTER — Telehealth: Payer: Self-pay

## 2024-08-13 NOTE — Telephone Encounter (Signed)
 Copied from CRM [id number]. Topic: Clinical - Medication Question >> Aug 07, 2024  4:43 PM Dedra B wrote: Reason for CRM: Pt would like Dr. Geofm or Tobias to give her a call as soon as possible regarding a medication question she has. >> Aug 13, 2024  9:26 AM Montie POUR wrote: Latrise wants to know if Dr. Geofm would call in Naproxen 500 MG for her back pain that Dr. Geofm knows about. Anallely wants to know if this medication would help with her back pain. Please call in medication to  [facility] at [facility]. Please call her with questions at [phone].

## 2024-08-13 NOTE — Telephone Encounter (Signed)
 Unfortunately she needs to be very careful with the naproxen or any anti-inflammatory because this can affect the stomach and kidney function.  Your kidney function is slightly decreased already and if she takes this on a regular basis it could decrease your kidney function more.

## 2024-08-14 NOTE — Telephone Encounter (Signed)
 Spoke with patient today.

## 2024-08-21 NOTE — Telephone Encounter (Unsigned)
 Copied from CRM [id number]. Topic: Clinical - Medication Question >> Aug 21, 2024  9:42 AM Vena HERO wrote: Reason for CRM: pt called in to ask dr burns or nurse if she has any samples of something called Muscle Jel in a dark gray packet. It is a topical ointment that she was given a sample of for her back. A friend gave it to her as a sample and it worked so well that she would like to figure how to get some of her own. Please call pt on either of her numbers

## 2024-08-22 NOTE — Telephone Encounter (Signed)
 Message left for patient today that this is something we do not carry and she would need to check with local pharmacy or purchase on line.

## 2024-09-19 ENCOUNTER — Ambulatory Visit: Admitting: Dermatology

## 2024-09-19 ENCOUNTER — Encounter: Payer: Self-pay | Admitting: Dermatology

## 2024-09-19 VITALS — BP 131/82 | HR 52

## 2024-09-19 DIAGNOSIS — L649 Androgenic alopecia, unspecified: Secondary | ICD-10-CM | POA: Diagnosis not present

## 2024-09-19 DIAGNOSIS — L219 Seborrheic dermatitis, unspecified: Secondary | ICD-10-CM | POA: Diagnosis not present

## 2024-09-19 MED ORDER — SAFETY SEAL MISCELLANEOUS MISC: 1.0000 | Freq: Every morning | 11 refills | Status: DC

## 2024-09-19 MED ORDER — SAFETY SEAL MISCELLANEOUS MISC: 1.0000 | Freq: Every morning | 11 refills | Status: AC

## 2024-09-19 MED ORDER — FLUOCINOLONE ACETONIDE SCALP 0.01 % EX OIL: 1.0000 | TOPICAL_OIL | CUTANEOUS | 11 refills | Status: AC | PRN

## 2024-09-19 MED ORDER — FLUOCINOLONE ACETONIDE SCALP 0.01 % EX OIL: 1.0000 | TOPICAL_OIL | CUTANEOUS | 11 refills | Status: DC | PRN

## 2024-09-19 NOTE — Progress Notes (Signed)
   New Patient Visit   Subjective  Gloria [name] is a 73 y.o. female who presents for the following: Hair Loss  Patient states she has Hair Loss located at the scalp that she would like to have examined. Patient reports the areas have been there for 2 or more years. She reports the areas are bothersome. She reports her scalp is very itchy. Patient rates irritation 7 out of 10. Patient reports she has previously been treated for the itchy scalp (Dr. Livingston). She was prescribed Clobetasol  Solutions. Her last usage was this morning. She reports she applies it every other day because it caused her scalp to be tender.  She She reports her previous hair care practices consisted of relaxers for many years, sew ins. Patient reports family history of hair loss (Daughter had Alopecia Areata & Niece).  The following portions of the chart were reviewed this encounter and updated as appropriate: medications, allergies, medical history  Review of Systems:  No other skin or systemic complaints except as noted in HPI or Assessment and Plan.  Objective  Well appearing patient in no apparent distress; mood and affect are within normal limits.  A focused examination was performed of the following areas: Scalp  Relevant exam findings are noted in the Assessment and Plan.              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 Ongoing for years and expected to progress if untreated.  - Prescribe a topical mixture of minoxidil and finasteride to be applied every morning to the affected areas of the scalp. - Instruct to apply the topical sparingly to avoid facial hair growth. - Coordinate with [facility] compounding pharmacy in [place]  for prescription fulfillment and delivery. - Advise that the topical solution may last up to a month and a half if used sparingly. -  Schedule follow-up in six months to assess progress.  Seborrheic dermatitis  Chronic itching of the scalp, likely related to seborrheic dermatitis, exacerbated by seasonal changes and associated with dandruff.  - Recommend DHS Zinc shampoo to be used biweekly, allowing it to sit for two minutes before rinsing and following with regular shampoo and conditioner. - Prescribe Derma Smooth oil to be applied to the scalp whenever itching occurs. - Send prescription for Derma Smooth oil to her regular pharmacy.  ANDROGENETIC ALOPECIA   Related Medications Safety Seal Miscellaneous MISC Apply 1 Application topically in the morning. Medication Name: Hormonic Hair Solution (Minoxidil 10% and Finasteride 0.05%) SEBORRHEIC DERMATITIS   Related Medications Fluocinolone Acetonide Scalp (DERMA-SMOOTHE/FS SCALP) 0.01 % OIL Apply 1 Application topically as needed. Apply to your scalp as needed for itching  Return in about 6 months (around 03/20/2025) for Androgenetic Alopecia & Seb Derm F/U.  I, Jetta Ager, am acting as Neurosurgeon for [employer], DO.  Documentation: I have reviewed the above documentation for accuracy and completeness, and I agree with the above.  Delon Lenis, DO

## 2024-09-19 NOTE — Patient Instructions (Addendum)
 VISIT SUMMARY:  Today, we discussed your concerns about hair thinning and scalp itching. You have been experiencing hair thinning for several years, particularly at the vertex and temples, and significant scalp itching, which you believe is due to seasonal dandruff. We reviewed your current medications and determined that they are not contributing to your hair loss.  YOUR PLAN:  -ANDROGENETIC ALOPECIA:  Androgenetic alopecia is a common form of hair loss in both men and women, often due to hormonal changes after menopause.  To address this, I have prescribed a topical mixture of minoxidil and finasteride to be applied every morning to the affected areas of your scalp. Please apply it sparingly to avoid facial hair growth.  The prescription will be coordinated with [facility] compounding pharmacy in [place] , and it should last up to a month and a half if used sparingly. We will follow up in six months to assess your progress.  -SEBORRHEIC DERMATITIS OF SCALP: Seborrheic dermatitis is a common skin condition that causes itching and dandruff, often worsened by seasonal changes.  I recommend using DHS Zinc shampoo biweekly, allowing it to sit for two minutes before rinsing, and then following with your regular shampoo and conditioner.  Additionally, I have prescribed Derma Smooth oil to be applied to your scalp whenever itching occurs. The prescription for Derma Smooth oil will be sent to your regular pharmacy.  INSTRUCTIONS:  Please follow up in six months to assess the progress of your hair thinning treatment. If you have any questions or concerns before then, do not hesitate to contact our office.           Important Information   Due to recent changes in healthcare laws, you may see results of your pathology and/or laboratory studies on MyChart before the doctors have had a chance to review them. We understand that in some cases there may be results that are confusing or concerning to  you. Please understand that not all results are received at the same time and often the doctors may need to interpret multiple results in order to provide you with the best plan of care or course of treatment. Therefore, we ask that you please give us  2 business days to thoroughly review all your results before contacting the office for clarification. Should we see a critical lab result, you will be contacted sooner.     If You Need Anything After Your Visit   If you have any questions or concerns for your doctor, please call our main line at [phone]. If no one answers, please leave a voicemail as directed and we will return your call as soon as possible. Messages left after 4 pm will be answered the following business day.    You may also send us  a message via MyChart. We typically respond to MyChart messages within 1-2 business days.  For prescription refills, please ask your pharmacy to contact our office. Our fax number is [phone].  If you have an urgent issue when the clinic is closed that cannot wait until the next business day, you can page your doctor at the number below.     Please note that while we do our best to be available for urgent issues outside of office hours, we are not available 24/7.    If you have an urgent issue and are unable to reach us , you may choose to seek medical care at your doctor's office, retail clinic, urgent care center, or emergency room.   If you have a medical  emergency, please immediately call 911 or go to the emergency department. In the event of inclement weather, please call our main line at [phone] for an update on the status of any delays or closures.  Dermatology Medication Tips: Please keep the boxes that topical medications come in in order to help keep track of the instructions about where and how to use these. Pharmacies typically print the medication instructions only on the boxes and not directly on the medication tubes.   If  your medication is too expensive, please contact our office at [phone] or send us  a message through MyChart.    We are unable to tell what your co-pay for medications will be in advance as this is different depending on your insurance coverage. However, we may be able to find a substitute medication at lower cost or fill out paperwork to get insurance to cover a needed medication.    If a prior authorization is required to get your medication covered by your insurance company, please allow us  1-2 business days to complete this process.   Drug prices often vary depending on where the prescription is filled and some pharmacies may offer cheaper prices.   The website www.goodrx.com contains coupons for medications through different pharmacies. The prices here do not account for what the cost may be with help from insurance (it may be cheaper with your insurance), but the website can give you the price if you did not use any insurance.  - You can print the associated coupon and take it with your prescription to the pharmacy.  - You may also stop by our office during regular business hours and pick up a GoodRx coupon card.  - If you need your prescription sent electronically to a different pharmacy, notify our office through [facility] or by phone at [phone]

## 2024-09-23 ENCOUNTER — Telehealth: Payer: Self-pay

## 2024-09-23 NOTE — Telephone Encounter (Signed)
 Copied from CRM [id number]. Topic: Clinical - Lab/Test Results >> Sep 23, 2024  9:52 AM Franky GRADE wrote: Reason for CRM: Patient is calling because she received a letter with her lab results and had some questions she wanted to discuss. Patient did not want to disclose question to me and wishes to speak with Dr.Burn's Medical Assistant. I advised patient will be getting a call no later than end of day today.

## 2024-09-23 NOTE — Telephone Encounter (Signed)
 Left message for patient to return call to clinic.  If she calls back please see what questions she has.

## 2024-09-25 DIAGNOSIS — Z008 Encounter for other general examination: Secondary | ICD-10-CM | POA: Diagnosis not present

## 2024-09-25 DIAGNOSIS — N1831 Chronic kidney disease, stage 3a: Secondary | ICD-10-CM | POA: Diagnosis not present

## 2024-09-30 ENCOUNTER — Encounter: Payer: Self-pay | Admitting: Pharmacist

## 2024-09-30 NOTE — Progress Notes (Signed)
 Pharmacy Quality Measure Review  This patient is appearing on a report for being at risk of failing the adherence measure for cholesterol (statin) medications this calendar year.   Medication: Atorvastatin   Last fill date: 08/29/24 for 90 day supply  Insurance report was not up to date. No action needed at this time.   Darrelyn Drum, PharmD, BCPS, CPP Clinical Pharmacist Practitioner [facility] [phone]

## 2024-10-02 ENCOUNTER — Other Ambulatory Visit: Payer: Self-pay | Admitting: Internal Medicine

## 2024-11-25 ENCOUNTER — Other Ambulatory Visit: Payer: Self-pay | Admitting: Internal Medicine

## 2024-12-06 ENCOUNTER — Encounter: Payer: Self-pay | Admitting: Pharmacist

## 2024-12-06 NOTE — Progress Notes (Signed)
 Pharmacy Quality Measure Review  This patient is appearing on a report for being at risk of failing the adherence measure for cholesterol (statin) medications this calendar year.   Medication: Atorvastatin  Last fill date: 11/25/24 for 90 day supply  Insurance report was not up to date. No action needed at this time.    Darrelyn Drum, PharmD, BCPS, CPP Clinical Pharmacist Practitioner  Primary Care at [facility] [phone]

## 2024-12-29 ENCOUNTER — Other Ambulatory Visit: Payer: Self-pay | Admitting: Internal Medicine

## 2025-01-08 ENCOUNTER — Ambulatory Visit (INDEPENDENT_AMBULATORY_CARE_PROVIDER_SITE_OTHER)

## 2025-01-08 ENCOUNTER — Ambulatory Visit

## 2025-01-08 VITALS — Ht 65.0 in | Wt 150.0 lb

## 2025-01-08 DIAGNOSIS — Z1231 Encounter for screening mammogram for malignant neoplasm of breast: Secondary | ICD-10-CM | POA: Diagnosis not present

## 2025-01-08 DIAGNOSIS — Z0000 Encounter for general adult medical examination without abnormal findings: Secondary | ICD-10-CM | POA: Diagnosis not present

## 2025-01-08 NOTE — Patient Instructions (Signed)
 Gloria [name],  Thank you for taking the time for your Medicare Wellness Visit. I appreciate your continued commitment to your health goals. Please review the care plan we discussed, and feel free to reach out if I can assist you further.  Please note that Annual Wellness Visits do not include a physical exam. Some assessments may be limited, especially if the visit was conducted virtually. If needed, we may recommend an in-person follow-up with your provider.  Ongoing Care Seeing your primary care provider every 3 to 6 months helps us  monitor your health and provide consistent, personalized care.   Referrals If a referral was made during today's visit and you haven't received any updates within two weeks, please contact the referred provider directly to check on the status.  Recommended Screenings:  Health Maintenance  Topic Date Due   Complete foot exam   01/24/2019   COVID-19 Vaccine (7 - 2025-26 season) 08/12/2024   Flu Shot  03/11/2025*   Pneumococcal Vaccine for age over 83 (3 of 3 - PCV20 or PCV21) 08/07/2025*   Hemoglobin A1C  02/07/2025   Eye exam for diabetics  03/06/2025   Kidney health urinalysis for diabetes  04/05/2025   Breast Cancer Screening  05/27/2025   Yearly kidney function blood test for diabetes  08/07/2025   Medicare Annual Wellness Visit  01/08/2026   Osteoporosis screening with Bone Density Scan  09/10/2027   Colon Cancer Screening  01/14/2032   DTaP/Tdap/Td vaccine (3 - Td or Tdap) 10/26/2032   Hepatitis C Screening  Completed   Meningitis B Vaccine  Aged Out   Zoster (Shingles) Vaccine  Discontinued  *Topic was postponed. The date shown is not the original due date.       01/08/2025   10:19 AM  Advanced Directives  Does Patient Have a Medical Advance Directive? Yes  Type of Estate Agent of [place];Living will  Does patient want to make changes to medical advance directive? Yes (Inpatient - patient requests chaplain consult to  change a medical advance directive)  Copy of Healthcare Power of Attorney in Chart? No - copy requested    Vision: Annual vision screenings are recommended for early detection of glaucoma, cataracts, and diabetic retinopathy. These exams can also reveal signs of chronic conditions such as diabetes and high blood pressure.  Dental: Annual dental screenings help detect early signs of oral cancer, gum disease, and other conditions linked to overall health, including heart disease and diabetes.

## 2025-01-08 NOTE — Progress Notes (Signed)
 "  Chief Complaint  Patient presents with   Medicare Wellness     Subjective:   Gloria [name] is a 74 y.o. female who presents for a Medicare Annual Wellness Visit.  Visit info / Clinical Intake: Medicare Wellness Visit Type:: Subsequent Annual Wellness Visit Persons participating in visit and providing information:: patient Medicare Wellness Visit Mode:: Telephone If telephone:: video declined Since this visit was completed virtually, some vitals may be partially provided or unavailable. Missing vitals are due to the limitations of the virtual format.: Documented vitals are patient reported If Telephone or Video please confirm:: I connected with patient using audio/video enable telemedicine. I verified patient identity with two identifiers, discussed telehealth limitations, and patient agreed to proceed. Patient Location:: Home Provider Location:: Office Interpreter Needed?: No Pre-visit prep was completed: yes AWV questionnaire completed by patient prior to visit?: no Living arrangements:: (!) lives alone Patient's Overall Health Status Rating: good Typical amount of pain: none Does pain affect daily life?: no Are you currently prescribed opioids?: no  Dietary Habits and Nutritional Risks How many meals a day?: 2 Eats fruit and vegetables daily?: yes Most meals are obtained by: preparing own meals In the last 2 weeks, have you had any of the following?: none Diabetic:: (!) yes Any non-healing wounds?: no How often do you check your BS?: 0 Would you like to be referred to a Nutritionist or for Diabetic Management? : no  Functional Status Activities of Daily Living (to include ambulation/medication): Independent Ambulation: Independent with device- listed below Home Assistive Devices/Equipment: Dentures (specify type); Eyeglasses Medication Administration: Independent Home Management (perform basic housework or laundry): Independent Manage your own finances?: yes Primary  transportation is: driving Concerns about vision?: no *vision screening is required for [facility]* Concerns about hearing?: no  Fall Screening Falls in the past year?: 0 Number of falls in past year: 0 Was there an injury with Fall?: 0 Fall Risk Category Calculator: 0 Patient Fall Risk Level: Low Fall Risk  Fall Risk Patient at Risk for Falls Due to: No Fall Risks Fall risk Follow up: Falls prevention discussed; Falls evaluation completed  Home and Transportation Safety: All rugs have non-skid backing?: N/A, no rugs All stairs or steps have railings?: yes Grab bars in the bathtub or shower?: (!) no Have non-skid surface in bathtub or shower?: yes Good home lighting?: yes Regular seat belt use?: yes Hospital stays in the last year:: no  Cognitive Assessment Difficulty concentrating, remembering, or making decisions? : no Will 6CIT or Mini Cog be Completed: yes What year is it?: 0 points What month is it?: 0 points Give patient an address phrase to remember (5 components): [address] [place], [place] About what time is it?: 0 points Count backwards from 20 to 1: 0 points Say the months of the year in reverse: 0 points Repeat the address phrase from earlier: 0 points 6 CIT Score: 0 points  Advance Directives (For Healthcare) Does Patient Have a Medical Advance Directive?: Yes Does patient want to make changes to medical advance directive?: Yes (Inpatient - patient requests chaplain consult to change a medical advance directive) Type of Advance Directive: Healthcare Power of [place]; Living will Copy of Healthcare Power of Attorney in Chart?: No - copy requested Copy of Living Will in Chart?: No - copy requested  Reviewed/Updated  Reviewed/Updated: Reviewed All (Medical, Surgical, Family, Medications, Allergies, Care Teams, Patient Goals)    Allergies (verified) Chlorhexidine , Acetaminophen , Beta adrenergic blockers, Clindamycin, Hydrocodone , Hydrocodone -acetaminophen ,  Lincomycin, Lithium, Morphine , Neomycin, Quinidine,  Quinine, Timolol, and Vicodin [hydrocodone -acetaminophen ]   Current Medications (verified) Outpatient Encounter Medications as of 01/08/2025  Medication Sig   amLODipine  (NORVASC ) 5 MG tablet TAKE 1 TABLET(5 MG) BY MOUTH DAILY   Ascorbic Acid (VITAMIN C PO) Take 1,000 mg by mouth daily at 6 (six) AM.   atorvastatin  (LIPITOR) 10 MG tablet TAKE 1 TABLET BY MOUTH DAILY AT 6 PM   Blood Pressure Monitoring (BLOOD PRESSURE MONITOR AUTOMAT) DEVI 1 kit by Does not apply route as directed. Please dispense blood pressure kit covered by insurance   CALCIUM -MAGNESIUM-ZINC PO Take 1 tablet by mouth daily at 6 (six) AM.   Cholecalciferol  (VITAMIN D ) 50 MCG (2000 UT) CAPS Take 2,000 Units by mouth daily.   clobetasol  (TEMOVATE ) 0.05 % external solution APPLY TOPICALLY TO THE AFFECTED AREA EVERY DAY FOR 6 WEEKS   Cyanocobalamin  (VITAMIN B 12 PO) Take 3,000 mcg by mouth daily at 6 (six) AM.   diclofenac  Sodium (VOLTAREN ) 1 % GEL Apply 4 g topically 4 (four) times daily.   Ferrous Sulfate  Dried (SLOW RELEASE IRON) 45 MG TBCR Take 1 tablet by mouth daily.   Fluocinolone  Acetonide Scalp (DERMA-SMOOTHE /FS SCALP) 0.01 % OIL Apply 1 Application topically as needed. Apply to your scalp as needed for itching   levothyroxine  (SYNTHROID ) 100 MCG tablet TAKE ONE TABLET BY MOUTH EVERY DAY 30 MINUTES PRIOR TO BREAKFAST 6 DAYS A WEEK AND 1/2 TABLET ONE DAY A WEEK   Multiple Vitamins-Minerals (WOMENS 50+ MULTI VITAMIN PO) Take 1 tablet by mouth daily at 6 (six) AM.   Omega-3 1000 MG CAPS Take 1,000 mg by mouth daily.   Polyethyl Glycol-Propyl Glycol (SYSTANE) 0.4-0.3 % SOLN Place 1 drop into both eyes daily as needed (dry eyes).   Safety Seal Miscellaneous MISC Apply 1 Application topically in the morning. Medication Name: Hormonic Hair Solution (Minoxidil 10% and Finasteride 0.05%)   telmisartan  (MICARDIS ) 80 MG tablet TAKE 1 TABLET BY MOUTH EVERY DAY   triamcinolone  cream  (KENALOG ) 0.1 % APPLY TOPICALLY TO THE AFFECTED AREA TWICE DAILY   acetaminophen  (TYLENOL ) 500 MG tablet Take 500 mg by mouth every 6 (six) hours as needed for moderate pain or mild pain.   aspirin  81 MG chewable tablet Chew 1 tablet (81 mg total) by mouth 2 (two) times daily. (Patient not taking: Reported on 01/08/2025)   BIOTIN 5000 PO Take 10,000 mcg by mouth daily. Hair, skin, Nails (Patient not taking: Reported on 01/08/2025)   celecoxib  (CELEBREX ) 200 MG capsule TAKE 1 CAPSULE(200 MG) BY MOUTH TWICE DAILY BETWEEN MEALS AS NEEDED (Patient not taking: Reported on 01/08/2025)   mupirocin  ointment (BACTROBAN ) 2 % APPLY TOPICALLY TO THE AFFECTED AREA DAILY AS NEEDED (Patient not taking: Reported on 01/08/2025)   No facility-administered encounter medications on file as of 01/08/2025.    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   Procedure: RIGHT TOTAL KNEE ARTHROPLASTY;  Surgeon: Vernetta Bruckner  Y, MD;  Location: [facility];  Service: Orthopedics;  Laterality: Right;   Family History  Problem Relation Age of Onset   Colon cancer Neg Hx    Colon polyps Neg Hx    Esophageal cancer Neg Hx    Rectal cancer Neg Hx    Stomach cancer Neg Hx    Social History   Occupational History   Occupation: retired  Tobacco Use   Smoking status: Never    Passive exposure: Never   Smokeless tobacco: Never  Vaping Use   Vaping status: Never Used  Substance and Sexual Activity   Alcohol use: No   Drug use: Never   Sexual activity: Not Currently    Tobacco Counseling Counseling given: No  SDOH Screenings   Food Insecurity: No Food Insecurity (01/08/2025)  Housing: Unknown (01/08/2025)  Transportation Needs: No Transportation Needs (01/08/2025)  Utilities: Not At Risk (01/08/2025)  Alcohol Screen: Low Risk (01/05/2024)  Depression (PHQ2-9): Low Risk (01/08/2025)  Financial Resource Strain: Medium Risk (01/05/2024)  Physical Activity: Insufficiently Active (01/08/2025)  Social Connections: Moderately Integrated (01/08/2025)  Stress: No Stress Concern Present (01/08/2025)  Tobacco Use: Low Risk (01/08/2025)  Health Literacy: Adequate Health Literacy (01/08/2025)   See flowsheets for full screening details  Depression Screen PHQ 2 & 9 Depression Scale- Over the past 2 weeks, how often have you been bothered by any of the following problems? Little interest or pleasure in doing things: 0 Feeling down, depressed, or hopeless (PHQ Adolescent also includes...irritable): 0 PHQ-2 Total Score: 0 Trouble falling or staying asleep, or sleeping too much: 0 Feeling tired or having little energy: 0 Poor appetite or overeating (PHQ Adolescent also includes...weight loss): 0 Feeling bad about yourself - or that you are a failure or have let yourself or your family down: 0 Trouble concentrating on things, such as reading the newspaper or watching television (PHQ Adolescent also includes...like school work): 0 Moving or speaking so slowly that other people could have noticed. Or the opposite - being so fidgety or restless that you have been moving around a lot more than usual: 0 Thoughts that you would be better off dead, or of hurting yourself in some way: 0 PHQ-9 Total Score: 0 If you checked off any problems, how difficult have these problems made it for you to do your work, take care of things at home, or get along with other people?: Not difficult at all  Depression Treatment Depression Interventions/Treatment : EYV7-0 Score <4 Follow-up Not  Indicated     Goals Addressed               This Visit's Progress     Patient Stated (pt-stated)        Patient stated she plans to continue to watch her diet             Objective:    Today's Vitals   01/08/25 1017  Weight: 150 lb (68 kg)  Height: 5' 5 (1.651 m)   Body mass index is 24.96 kg/m.  Hearing/Vision screen Hearing Screening - Comments:: Denies hearing difficulties   Vision Screening - Comments:: Wears eyeglasses for reading - up to date with routine eye exams with Charlie Glendia Gaudy Immunizations and Health Maintenance Health Maintenance  Topic Date Due   FOOT EXAM  01/24/2019   COVID-19 Vaccine (7 - 2025-26 season) 08/12/2024   Influenza Vaccine  03/11/2025 (Originally 07/12/2024)   Pneumococcal Vaccine: 50+ Years (3 of 3 - PCV20 or PCV21) 08/07/2025 (Originally 03/02/2020)   HEMOGLOBIN  A1C  02/07/2025   OPHTHALMOLOGY EXAM  03/06/2025   Diabetic kidney evaluation - Urine ACR  04/05/2025   Mammogram  05/27/2025   Diabetic kidney evaluation - eGFR measurement  08/07/2025   Medicare Annual Wellness (AWV)  01/08/2026   Bone Density Scan  09/10/2027   Colonoscopy  01/14/2032   DTaP/Tdap/Td (3 - Td or Tdap) 10/26/2032   Hepatitis C Screening  Completed   Meningococcal B Vaccine  Aged Out   Zoster Vaccines- Shingrix  Discontinued        Assessment/Plan:  This is a routine wellness examination for Gloria [name].  Mammogram status: ordered today  Patient Care Team: Geofm Glade PARAS, MD as PCP - General (Internal Medicine) Caress, Lynwood, MD (Neurology) Loreda Hacker, DPM (Podiatry) Livingston Rigg, MD (Dermatology) Rosalie Kitchens, MD (Gastroenterology) Szabat, Toribio BROCKS, [facility] (Inactive) (Pharmacist) Octavia, Charlie Hamilton, MD as Consulting Physician (Ophthalmology)  I have personally reviewed and noted the following in the patients chart:   Medical and social history Use of alcohol, tobacco or illicit drugs  Current medications and supplements including  opioid prescriptions. Functional ability and status Nutritional status Physical activity Advanced directives List of other physicians Hospitalizations, surgeries, and ER visits in previous 12 months Vitals Screenings to include cognitive, depression, and falls Referrals and appointments  Orders Placed This Encounter  Procedures   MM 3D SCREENING MAMMOGRAM BILATERAL BREAST    Standing Status:   Future    Expiration Date:   01/08/2026    Reason for Exam (SYMPTOM  OR DIAGNOSIS REQUIRED):   screening for breast cancer    Preferred imaging location?:   [facility]   In addition, I have reviewed and discussed with patient certain preventive protocols, quality metrics, and best practice recommendations. A written personalized care plan for preventive services as well as general preventive health recommendations were provided to patient.   Gloria [name] CHRISTELLA Saba, CMA   01/08/2025   Return in 1 year (on 01/08/2026).  After Visit Summary: (Declined) Due to this being a telephonic visit, with patients personalized plan was offered to patient but patient Declined AVS at this time   Nurse Notes: scheduled 2027 AWV appt "

## 2025-01-13 ENCOUNTER — Ambulatory Visit: Payer: Self-pay

## 2025-01-13 NOTE — Telephone Encounter (Signed)
 FYI Only or Action Required?: FYI only for provider: appointment scheduled on 01/17/25.  Patient was last seen in primary care on 08/07/2024 by Geofm Glade PARAS, MD.  Called Nurse Triage reporting Hip Pain.  Symptoms began 3 days ago.  Interventions attempted: OTC medications: Biofreeze, Tylenol .  Symptoms are: gradually worsening.  Triage Disposition: See PCP When Office is Open (Within 3 Days)  Patient/caregiver understands and will follow disposition?: Yes                   Message from [place] sent at 01/13/2025 12:20 PM EST  Reason for Triage: Severe pain Right hip down to the leg   Reason for Disposition  [1] MODERATE pain (e.g., interferes with normal activities, limping) AND [2] present > 3 days  Answer Assessment - Initial Assessment Questions 1. LOCATION and RADIATION: Where is the pain located? Does the pain spread (shoot) anywhere else?     Right hip Radiates down back of right leg  2. QUALITY: What does the pain feel like?  (e.g., sharp, dull, aching, burning)     Throbbing pain, feels like can hardly walk  3. SEVERITY: How bad is the pain? What does it keep you from doing?   (Scale 1-10; or mild, moderate, severe)     7/10  4. ONSET: When did the pain start? Does it come and go, or is it there all the time?     Friday 01/10/25  5. WORK OR EXERCISE: Has there been any recent work or exercise that involved this part of the body?      Friday she was on the treadmill.  6. CAUSE: What do you think is causing the hip pain?      Sciatica.  7. AGGRAVATING FACTORS: What makes the hip pain worse? (e.g., walking, climbing stairs, running)     Getting up and trying to walk after lying down or sitting  8. OTHER SYMPTOMS: Do you have any other symptoms? (e.g., back pain, pain shooting down leg,  fever, rash)     No injury or fall, urinary or stool incontinence, urinary retention, fever, painful rash or blisters, numbness.  Protocols  used: Hip Pain-A-AH

## 2025-01-14 ENCOUNTER — Encounter: Payer: Self-pay | Admitting: Internal Medicine

## 2025-01-14 ENCOUNTER — Ambulatory Visit: Admitting: Internal Medicine

## 2025-01-14 VITALS — BP 120/68 | HR 58 | Temp 98.1°F | Ht 65.0 in | Wt 157.0 lb

## 2025-01-14 DIAGNOSIS — M545 Low back pain, unspecified: Secondary | ICD-10-CM | POA: Insufficient documentation

## 2025-01-14 DIAGNOSIS — L219 Seborrheic dermatitis, unspecified: Secondary | ICD-10-CM | POA: Insufficient documentation

## 2025-01-14 MED ORDER — METHYLPREDNISOLONE ACETATE 80 MG/ML IJ SUSP
80.0000 mg | Freq: Once | INTRAMUSCULAR | Status: AC
  Administered 2025-01-14: 80 mg via INTRAMUSCULAR

## 2025-01-14 MED ORDER — OXYCODONE-ACETAMINOPHEN 5-325 MG PO TABS: 1.0000 | ORAL_TABLET | Freq: Four times a day (QID) | ORAL | 0 refills | Status: AC | PRN

## 2025-01-14 NOTE — Patient Instructions (Addendum)
" ° ° ° ° °  Steroid injection given    Medications changes include :   oxycodone -acetaminophen  5-325 mg for severe pain    Do some PT exercises at home when able     Return if symptoms worsen or fail to improve.  "

## 2025-01-14 NOTE — Progress Notes (Signed)
 "   Subjective:    Patient ID: Gloria [name], female    DOB: [birth year], 74 y.o.   MRN: [id number]      HPI Gloria [name] is here for  Chief Complaint  Patient presents with   Sciatica pain    Right sided sciatica pain radiating down into right hip and leg    Discussed the use of AI scribe software for clinical note transcription with the patient, who gave verbal consent to proceed.  History of Present Illness Gloria [name] is a 74 year old female who presents with lower back and hip pain exacerbated by treadmill use.  She began experiencing lower back and hip pain this past Friday after using a treadmill for the first time since before COVID-19. She intended to walk for 30 minutes but ended up walking for 40 to 45 minutes. The pain started in her right hip, radiating down her leg to the back of her knee, and affecting her lower back. The pain worsened after treadmill use and was severe enough to impact her ability to walk after getting out of the car.  She has a history of receiving cortisone shots, which previously resulted in large blisters and scars on her back. She has not had this problem since January of the previous year when she received a shot that caused these blisters. She is concerned about the potential side effects of cortisone shots, including weight gain.  For pain management, she initially took two Tylenol , which provided minimal relief. She also applied heat, which helped temporarily. Due to severe pain, she took one oxycodone  left from a previous knee surgery, which helped her sleep through the night. She has one oxycodone  left and is reluctant to take [name] medication unless necessary.  No numbness or tingling in the leg but reports muscle cramping at night, particularly when in bed, which she describes as 'very painful'. Her leg feels weaker at times, especially when trying to walk. No changes in bowel or bladder function.  She mentions a history of knee replacement  and has used Biofreeze on the knee due to pain. She also reports using a heating pad at home, which provides temporary relief. She has not been out of the house since Friday due to the weather and her condition.  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 These treatments are helping her condition slowly improve.       Medications and allergies reviewed with patient and updated if appropriate.  Medications Ordered Prior to Encounter[1]  Review of Systems  Gastrointestinal:        No change in bowels  Genitourinary:        No change in bladder  Musculoskeletal:  Positive for back pain (and right leg pain to posterior knee).       Muscle cramping in right leg  Neurological:  Positive for weakness (right leg when walking). Negative for numbness.       Objective:   Vitals:   01/14/25 1548  BP: 120/68  Pulse: (!) 58  Temp: 98.1 F (36.7 C)  SpO2: 100%   BP Readings from Last 3 Encounters:  01/14/25 120/68  09/19/24 131/82  08/07/24 114/78   Wt Readings from Last 3 Encounters:  01/14/25 157 lb (71.2 kg)  01/08/25 150 lb (68 kg)  08/07/24 150 lb (68 kg)  Body mass index is 26.13 kg/m.    Physical Exam Constitutional:      General: She is not in acute distress.    Appearance: Normal appearance. She is not ill-appearing.  HENT:     Head: Normocephalic and atraumatic.  Musculoskeletal:        General: Tenderness (central and right lower back, right buttock) present.     Right lower leg: No edema.     Left lower leg: No edema.     Comments: Decreased extension of lumbar spine  Skin:    General: Skin is warm and dry.     Findings: No rash.  Neurological:     Mental Status: She is alert.     Sensory: No sensory deficit.     Motor: No weakness.     Gait: Gait abnormal (limping).             Assessment & Plan:    See Problem List for Assessment and Plan of chronic medical problems.         [1]  Current Outpatient Medications on File Prior to Visit  Medication Sig Dispense Refill   acetaminophen  (TYLENOL ) 500 MG tablet Take 500 mg by mouth every 6 (six) hours as needed for moderate pain or mild pain.     amLODipine  (NORVASC ) 5 MG tablet TAKE 1 TABLET(5 MG) BY MOUTH DAILY 90 tablet 2   Ascorbic Acid (VITAMIN C PO) Take 1,000 mg by mouth daily at 6 (six) AM.     atorvastatin  (LIPITOR) 10 MG tablet TAKE 1 TABLET BY MOUTH DAILY AT 6 PM 90 tablet 2   Blood Pressure Monitoring (BLOOD PRESSURE MONITOR AUTOMAT) DEVI 1 kit by Does not apply route as directed. Please dispense blood pressure kit covered by insurance 1 each 0   CALCIUM -MAGNESIUM-ZINC PO Take 1 tablet by mouth daily at 6 (six) AM.     Cholecalciferol  (VITAMIN D ) 50 MCG (2000 UT) CAPS Take 2,000 Units by mouth daily.     clobetasol  (TEMOVATE ) 0.05 % external solution APPLY TOPICALLY TO THE AFFECTED AREA EVERY DAY FOR 6 WEEKS 50 mL 2   Cyanocobalamin  (VITAMIN B 12 PO) Take 3,000 mcg by mouth daily at 6 (six) AM.     diclofenac  Sodium (VOLTAREN ) 1 % GEL Apply 4 g topically 4 (four) times daily. 100 g 5   Ferrous Sulfate  Dried (SLOW RELEASE IRON) 45 MG TBCR Take 1 tablet by mouth daily.     Fluocinolone  Acetonide Scalp (DERMA-SMOOTHE /FS SCALP) 0.01 % OIL Apply 1 Application topically as needed. Apply to your scalp as needed for itching 118.28 mL 11   levothyroxine  (SYNTHROID ) 100 MCG tablet TAKE ONE TABLET BY MOUTH EVERY DAY 30 MINUTES PRIOR TO BREAKFAST 6 DAYS A WEEK AND 1/2 TABLET ONE DAY A WEEK 90 tablet 1   Multiple Vitamins-Minerals (WOMENS 50+ MULTI VITAMIN PO) Take 1 tablet by mouth daily at 6 (six) AM.     Omega-3 1000 MG CAPS Take 1,000 mg by mouth daily.     Polyethyl Glycol-Propyl Glycol (SYSTANE) 0.4-0.3 % SOLN Place 1 drop into both eyes daily as needed (dry eyes).     Safety Seal Miscellaneous MISC  Apply 1 Application topically in the morning. Medication Name: Hormonic Hair Solution (Minoxidil 10% and Finasteride 0.05%) 30 mL 11   telmisartan  (MICARDIS ) 80 MG tablet TAKE 1 TABLET BY MOUTH EVERY DAY 90 tablet 1   triamcinolone  cream (KENALOG ) 0.1 % APPLY TOPICALLY TO THE AFFECTED AREA TWICE DAILY 30 g 0  aspirin  81 MG chewable tablet Chew 1 tablet (81 mg total) by mouth 2 (two) times daily. (Patient not taking: Reported on 01/14/2025) 30 tablet 0   BIOTIN 5000 PO Take 10,000 mcg by mouth daily. Hair, skin, Nails (Patient not taking: Reported on 01/14/2025)     celecoxib  (CELEBREX ) 200 MG capsule TAKE 1 CAPSULE(200 MG) BY MOUTH TWICE DAILY BETWEEN MEALS AS NEEDED (Patient not taking: Reported on 01/14/2025) 60 capsule 1   mupirocin  ointment (BACTROBAN ) 2 % APPLY TOPICALLY TO THE AFFECTED AREA DAILY AS NEEDED (Patient not taking: Reported on 01/14/2025) 30 g 0   No current facility-administered medications on file prior to visit.   "

## 2025-01-14 NOTE — Assessment & Plan Note (Addendum)
 Acute Started 4 days ago Lower back pain, right leg radiculopathy to posterior knee Some associated leg weakness, no numbness or tingling History of similar in the past Discussed options for treatment Depo-Medrol  80 mg IM x 1 today, deferred oral prednisone  Oxycodone -acetaminophen  5-325 mg 1-2 tabs every 6 hours as needed severe pain-she will only take this she has to Avoid bending, twisting or lifting When she is able to do some of the exercises that she learned at PT in the past She will let me know if her symptoms are not improving

## 2025-01-17 ENCOUNTER — Ambulatory Visit: Payer: Self-pay

## 2025-01-17 ENCOUNTER — Ambulatory Visit: Admitting: Family Medicine

## 2025-01-17 NOTE — Telephone Encounter (Signed)
 Called seeking clarification on how often to take oxycodone . Pt advised and denied other questions  FYI Only or Action Required?: FYI only for provider: home care advised.  Patient was last seen in primary care on 01/14/2025 by Geofm Glade PARAS, MD.  Called Nurse Triage reporting Medication Assistance. Triage Disposition: Information or Advice Only Call  Patient/caregiver understands and will follow disposition?: Yes  Reason for Triage: pt is calling having pain in her left side  Reason for Disposition  Caller has medicine question only, adult not sick, AND triager answers question  Answer Assessment - Initial Assessment Questions 1. NAME of MEDICINE: What medicine(s) are you calling about?     Oxycodone   2. QUESTION: What is your question? (e.g., double dose of medicine, side effect)     Pt wondering if it is okay that she is only taking the oxycodone  once per day when the bottle says she can take it more frequently. Pt educated on the order being to take medication as needed, not routinely. Pt vocalizes understanding.    4. SYMPTOMS: Do you have any symptoms? If Yes, ask: What symptoms are you having?  How bad are the symptoms (e.g., mild, moderate, severe)     Ongoing hip pain and lower back pain for which she was seen in office  Protocols used: Medication Question Call-A-AH

## 2025-02-10 ENCOUNTER — Ambulatory Visit: Admitting: Internal Medicine

## 2025-02-12 ENCOUNTER — Ambulatory Visit: Admitting: Orthopaedic Surgery

## 2025-03-24 ENCOUNTER — Ambulatory Visit: Admitting: Dermatology

## 2025-05-28 ENCOUNTER — Ambulatory Visit

## 2025-08-19 ENCOUNTER — Encounter: Admitting: Internal Medicine

## 2026-01-09 ENCOUNTER — Ambulatory Visit
